# Patient Record
Sex: Female | Born: 1950 | Race: White | Hispanic: No | Marital: Married | Smoking: Never smoker
Health system: Southern US, Academic
[De-identification: ages and names within clinical notes are randomized; demographics above are authoritative.]

## PROBLEM LIST (undated history)

## (undated) DIAGNOSIS — H409 Unspecified glaucoma: Secondary | ICD-10-CM

## (undated) DIAGNOSIS — G4700 Insomnia, unspecified: Secondary | ICD-10-CM

## (undated) DIAGNOSIS — I951 Orthostatic hypotension: Secondary | ICD-10-CM

## (undated) DIAGNOSIS — M1990 Unspecified osteoarthritis, unspecified site: Secondary | ICD-10-CM

## (undated) DIAGNOSIS — F32A Depression, unspecified: Secondary | ICD-10-CM

## (undated) DIAGNOSIS — E782 Mixed hyperlipidemia: Secondary | ICD-10-CM

## (undated) DIAGNOSIS — G459 Transient cerebral ischemic attack, unspecified: Secondary | ICD-10-CM

## (undated) DIAGNOSIS — E119 Type 2 diabetes mellitus without complications: Secondary | ICD-10-CM

## (undated) DIAGNOSIS — M549 Dorsalgia, unspecified: Secondary | ICD-10-CM

## (undated) DIAGNOSIS — E559 Vitamin D deficiency, unspecified: Secondary | ICD-10-CM

## (undated) DIAGNOSIS — G43909 Migraine, unspecified, not intractable, without status migrainosus: Secondary | ICD-10-CM

## (undated) DIAGNOSIS — Z9229 Personal history of other drug therapy: Secondary | ICD-10-CM

## (undated) DIAGNOSIS — G8929 Other chronic pain: Secondary | ICD-10-CM

## (undated) DIAGNOSIS — I10 Essential (primary) hypertension: Secondary | ICD-10-CM

## (undated) DIAGNOSIS — M797 Fibromyalgia: Secondary | ICD-10-CM

## (undated) DIAGNOSIS — F419 Anxiety disorder, unspecified: Secondary | ICD-10-CM

## (undated) HISTORY — DX: Orthostatic hypotension: I95.1

## (undated) HISTORY — PX: HX KNEE REPLACMENT: SHX125

## (undated) HISTORY — DX: Insomnia, unspecified: G47.00

## (undated) HISTORY — DX: Dorsalgia, unspecified: M54.9

## (undated) HISTORY — DX: Unspecified osteoarthritis, unspecified site: M19.90

## (undated) HISTORY — DX: Type 2 diabetes mellitus without complications: E11.9

## (undated) HISTORY — DX: Essential (primary) hypertension: I10

## (undated) HISTORY — PX: HX GALL BLADDER SURGERY/CHOLE: SHX55

## (undated) HISTORY — DX: Migraine, unspecified, not intractable, without status migrainosus: G43.909

## (undated) HISTORY — DX: Other chronic pain: G89.29

## (undated) HISTORY — PX: SKIN CANCER EXCISION: SHX779

## (undated) HISTORY — DX: Fibromyalgia: M79.7

## (undated) HISTORY — PX: HX ROTATOR CUFF REPAIR: SHX139

## (undated) HISTORY — DX: Personal history of other drug therapy: Z92.29

## (undated) HISTORY — DX: Depression, unspecified: F32.A

## (undated) HISTORY — DX: Vitamin D deficiency, unspecified: E55.9

## (undated) HISTORY — DX: Unspecified glaucoma: H40.9

## (undated) HISTORY — DX: Anxiety disorder, unspecified: F41.9

## (undated) HISTORY — DX: Hypomagnesemia: E83.42

## (undated) HISTORY — DX: Mixed hyperlipidemia: E78.2

---

## 1993-02-28 ENCOUNTER — Other Ambulatory Visit (HOSPITAL_COMMUNITY): Payer: Self-pay

## 2020-04-30 IMAGING — CT CT ABDOMEN & PELVIS WITHOUT DYE
2 series · 13 of 38 positions shown, 19 images · non-contrast
Comparison: None.

EXAM:  CT ABDOMEN & PELVIS WITHOUT DYE
INDICATION: Right-sided abdominal pain.
TECHNIQUE: A CT scan is performed using oral contrast only.  Exam was performed using one or more of the following dose reduction techniques: Automated exposure control, adjustment of the mA and/or kV according to patient size, or the use of iterative reconstruction technique. Radiation dose 907 mGy cm.

[Series 8039: cor · coronal · 0.89mm/px · 12 of 128 slices shown, 17 images]
[im 11/128  soft-tissue]
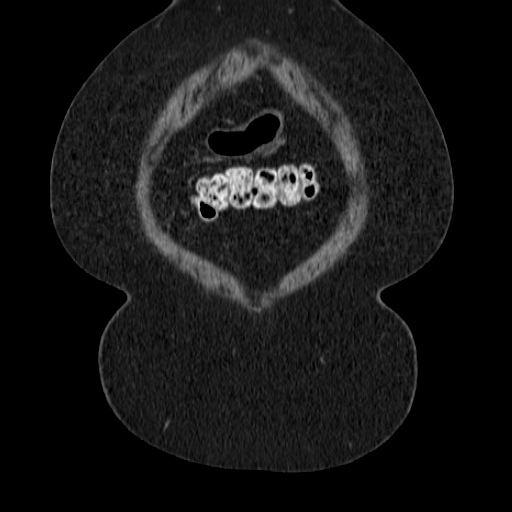
[im 11/128  lung]
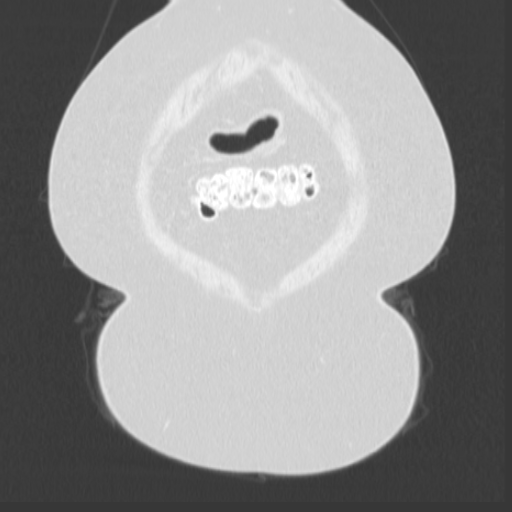
[im 11/128  bone]
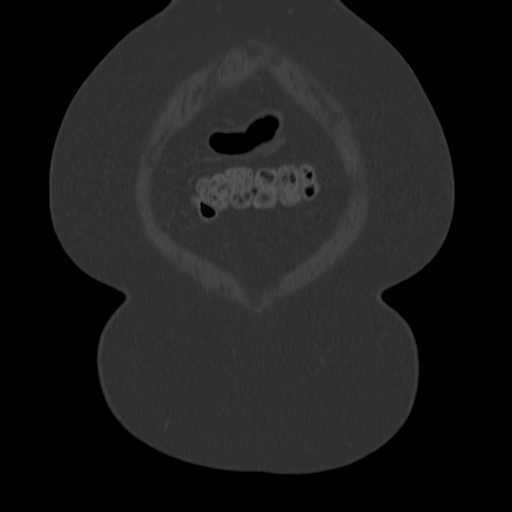
[im 22/128  soft-tissue]
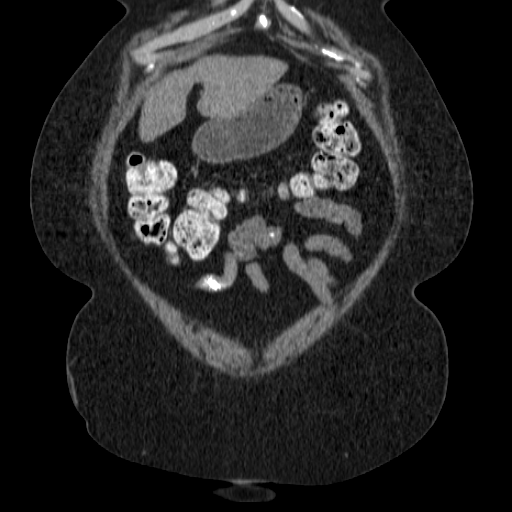
[im 22/128  lung]
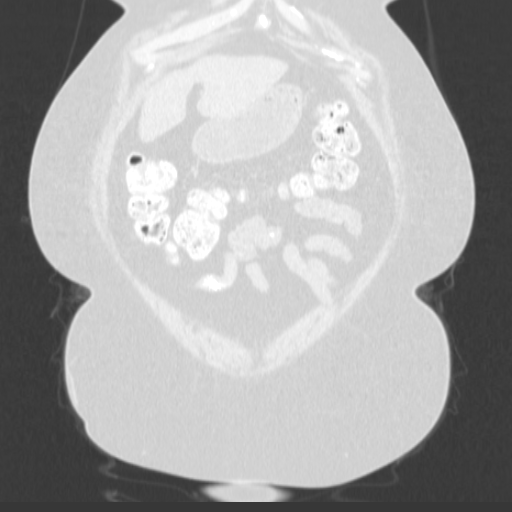
[im 29/128  soft-tissue]
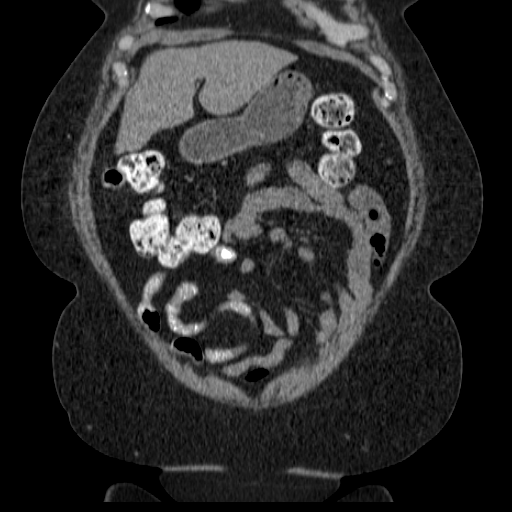
[im 32/128  lung]
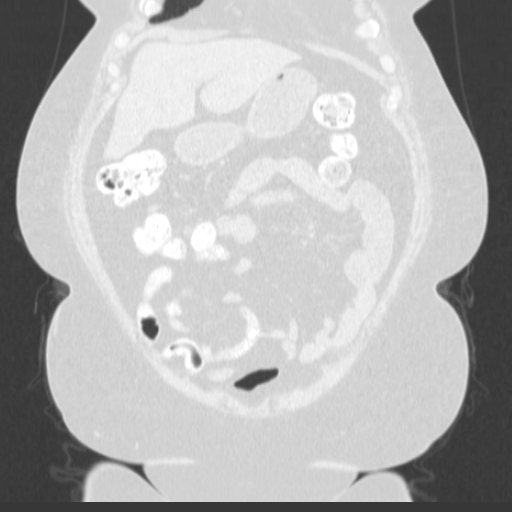
[im 43/128  soft-tissue]
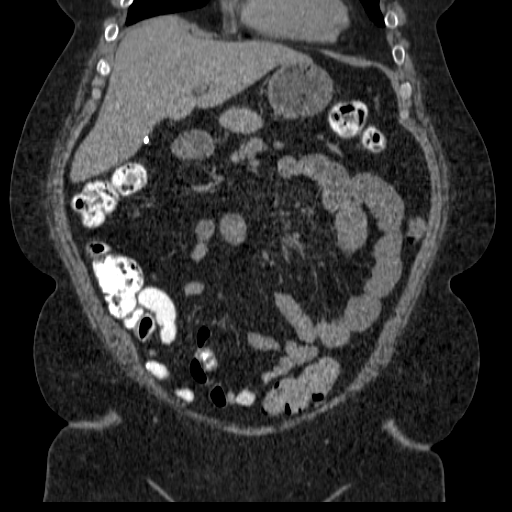
[im 43/128  lung]
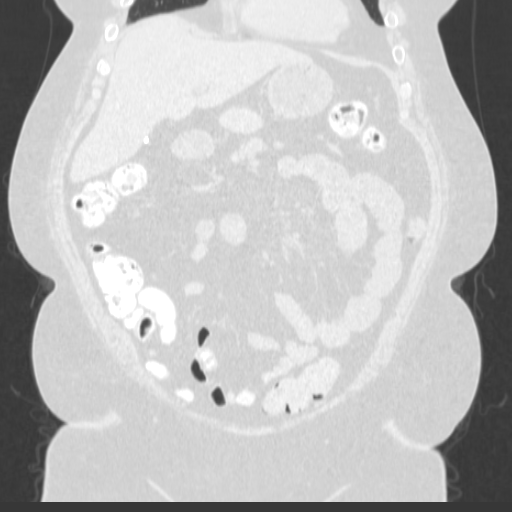
[im 53/128  soft-tissue]
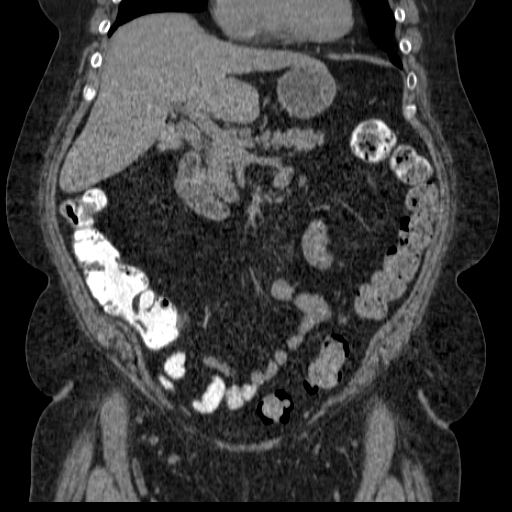
[im 64/128  soft-tissue]
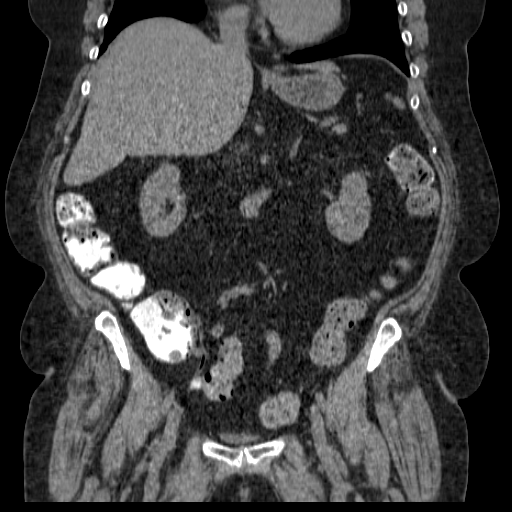
[im 75/128  soft-tissue]
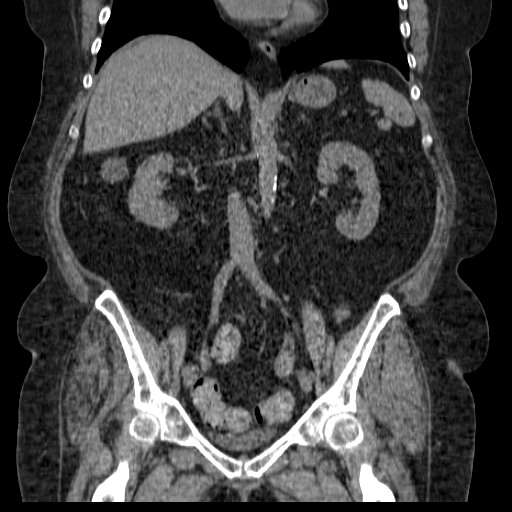
[im 85/128  soft-tissue]
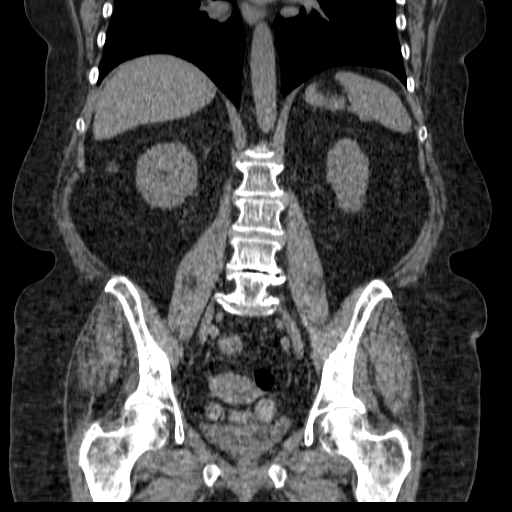
[im 99/128  soft-tissue]
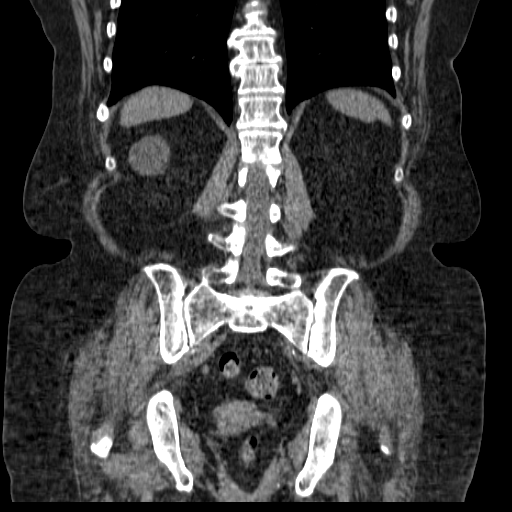
[im 99/128  bone]
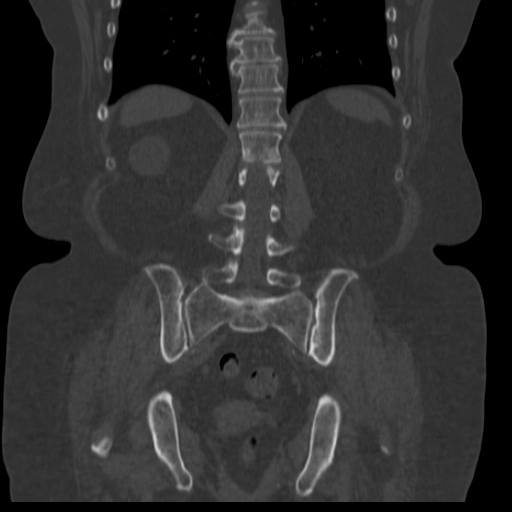
[im 106/128  soft-tissue]
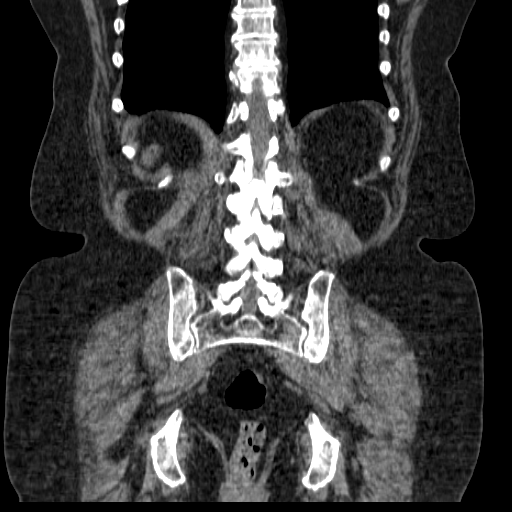
[im 117/128  soft-tissue]
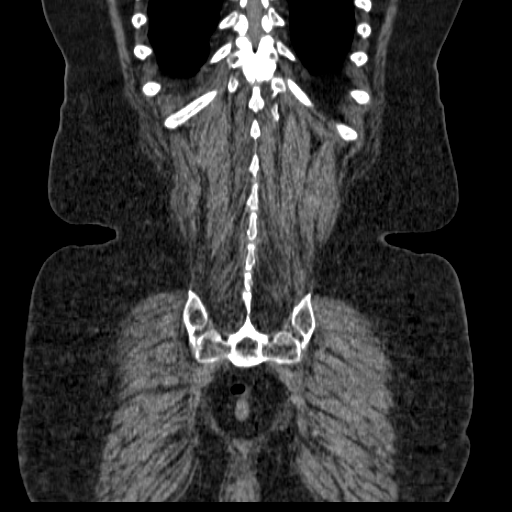

[sag · sagittal · 0.89mm/px · 1 of 191 slices shown, 2 images]
[im 96/191  soft-tissue]
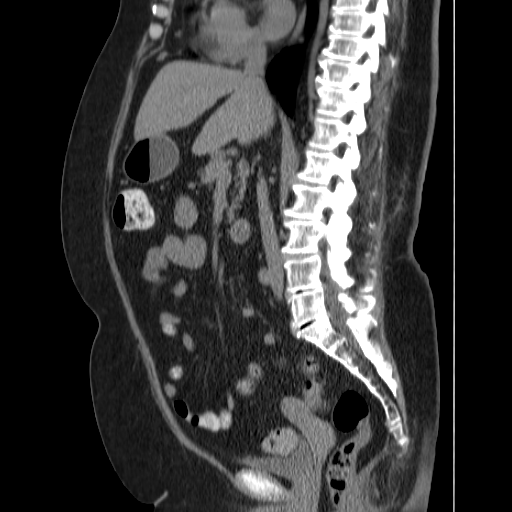
[im 96/191  bone]
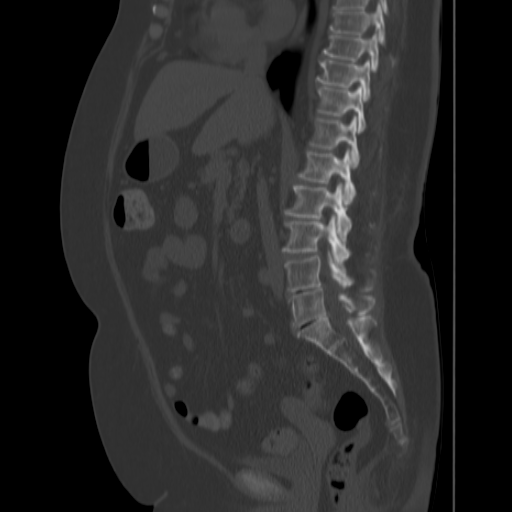

[13 of 38 positions shown; findings below may reference images not displayed]

FINDINGS: The lung bases are clear.  The attenuation coefficients of the liver, spleen, and pancreas are uniform.  No intrahepatic biliary duct dilatation is observed.  The gallbladder is surgically absent.  The adrenal glands are seen bilaterally and appear normal.  No renal calculi or hydronephrosis is observed.  There is an exophytic cyst measuring 3.9 cm arising from the upper pole of the right kidney.  An ultrasound can confirm this finding.  The aorta is normal in caliber.  No dilated loops of bowel, free fluid or free air is seen in the peritoneum.  The appendix is visualized and appears normal.  There are diverticula noted in the sigmoid colon with some wall thickening and early diverticulitis cannot be excluded.  The uterus is normal in size, shape and contour.  No adnexal masses are identified.  Bone window images show mild degenerative changes in the lower lumbar spine.  No aggressive lytic or blastic bony lesions identified.  No pathologic lymphadenopathy is seen.
IMPRESSION: A 3.9 cm cyst appearing to arise from the upper pole of the right kidney.  

No renal calculi or obstructive uropathy.

The gallbladder is surgically absent.

Normal-appearing appendix. 

Diverticula in the sigmoid colon with wall thickening.  Early diverticulitis cannot be excluded.

No dilated loops of bowel, free fluid or free air in the peritoneum. 

No aggressive lytic or blastic bony lesion. 

No evidence for pathologic lymphadenopathy.

## 2020-05-17 IMAGING — US US RETROPERITONEAL COMPLETE
1 series · 14 of 25 positions shown · non-contrast
Comparison: CT scan dated 04/30/2020.

EXAM:  US RETROPERITONEAL COMPLETE
INDICATION: Right renal cyst.

[Series 4: us retroperitoneal complete · 0.37mm/px · 14 of 58 slices shown]
[im 1/58]
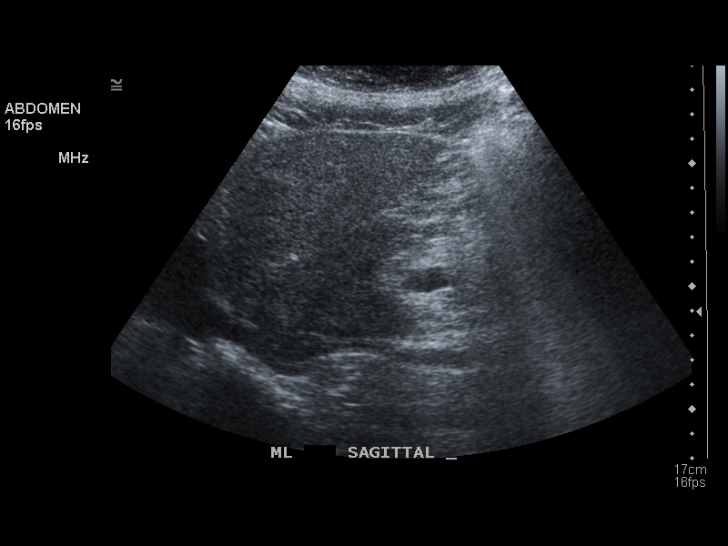
[im 5/58]
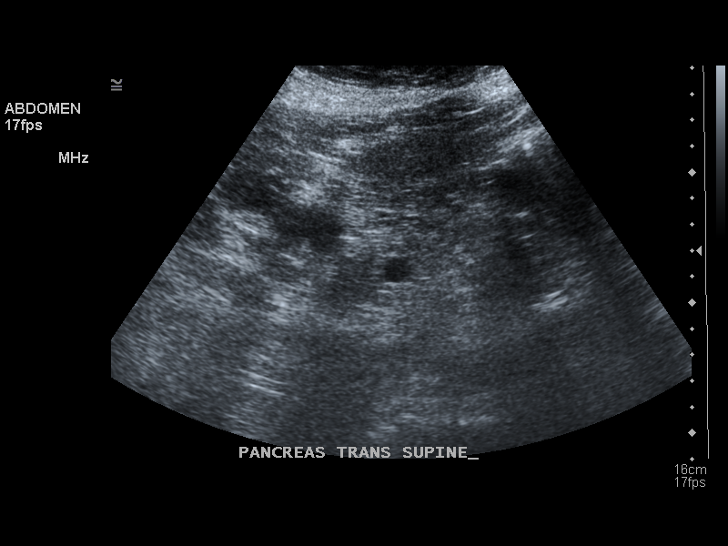
[im 10/58]
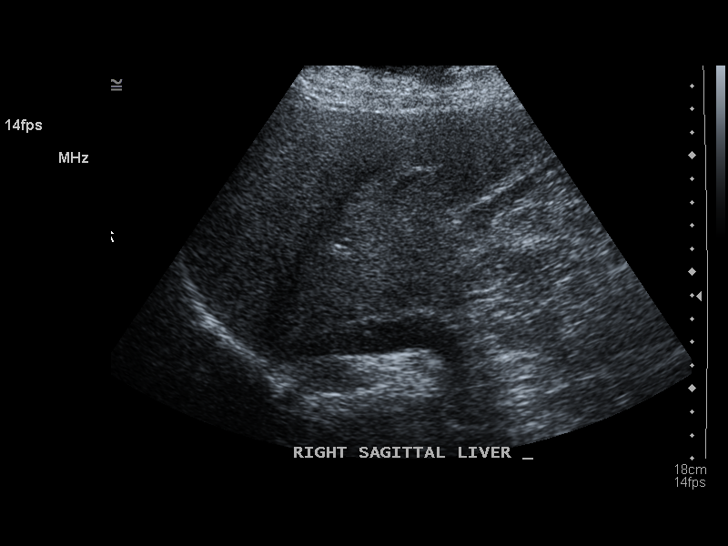
[im 15/58]
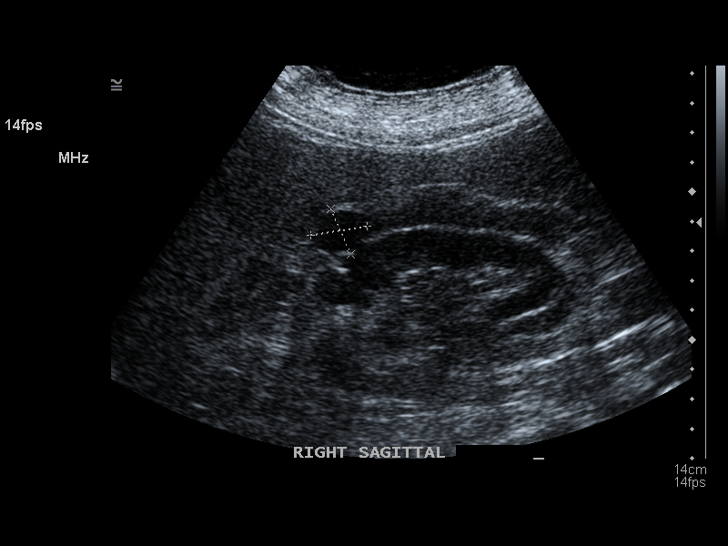
[im 20/58]
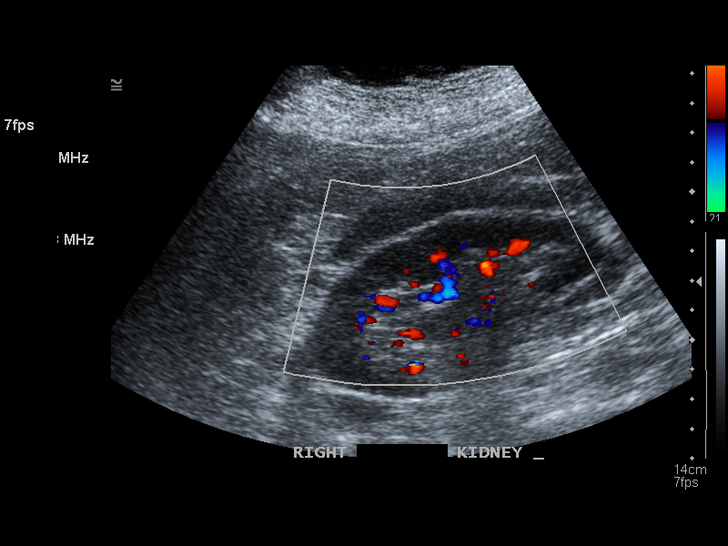
[im 22/58]
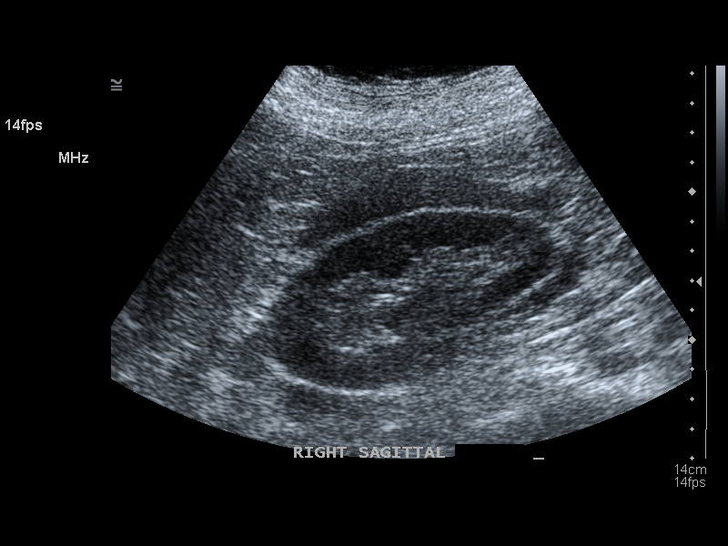
[im 27/58]
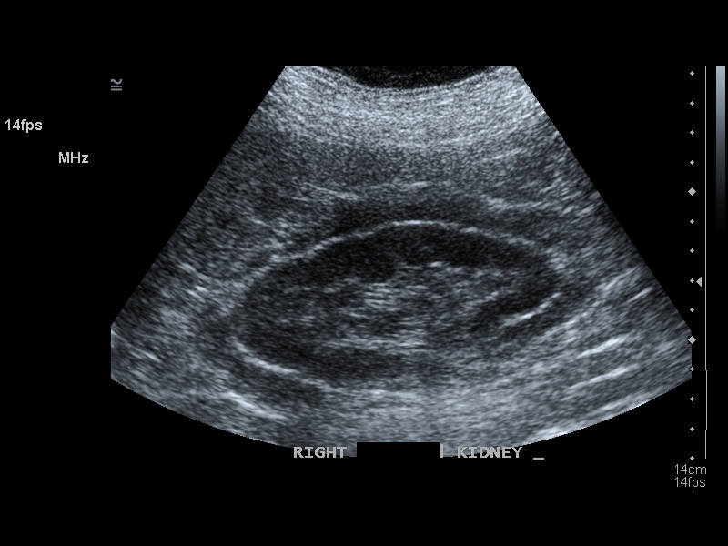
[im 31/58]
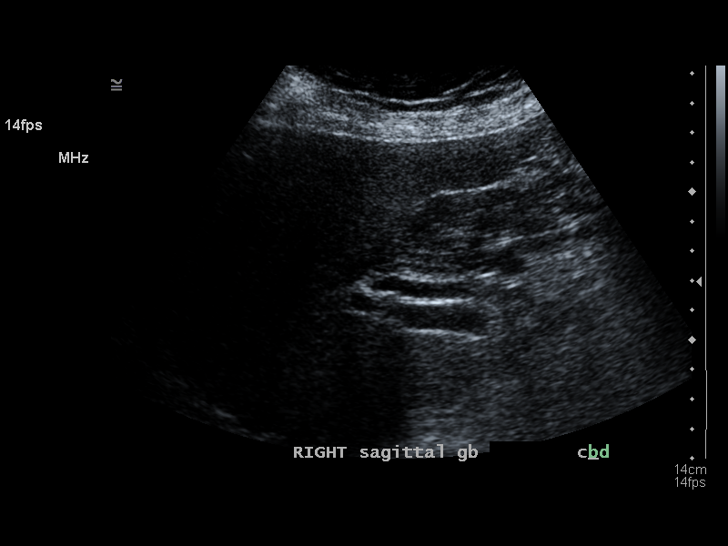
[im 36/58]
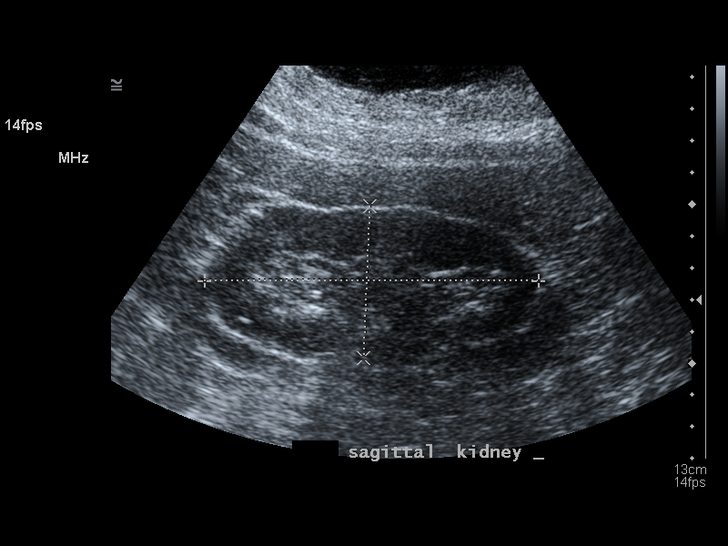
[im 39/58]
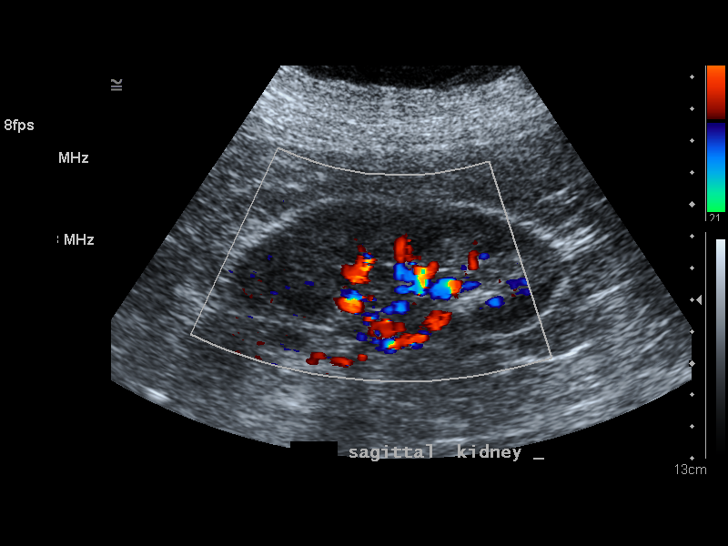
[im 43/58]
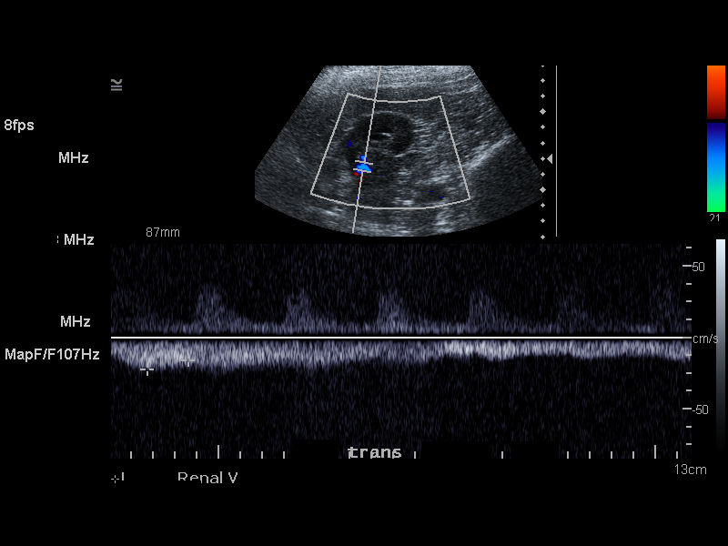
[im 48/58]
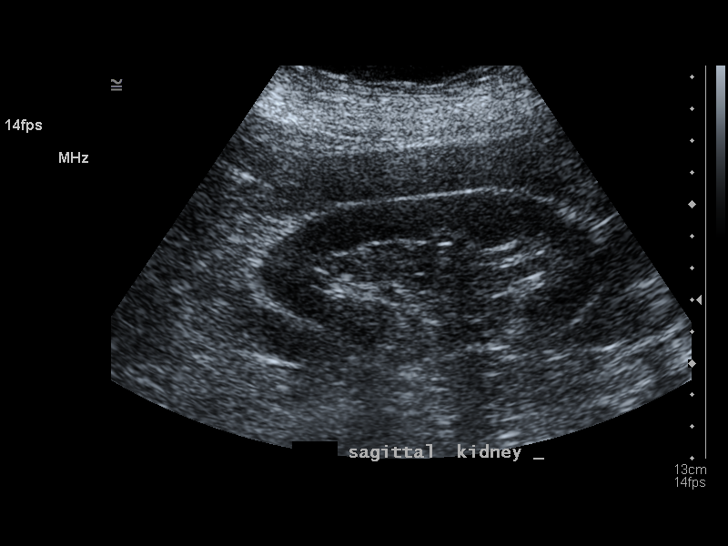
[im 53/58]
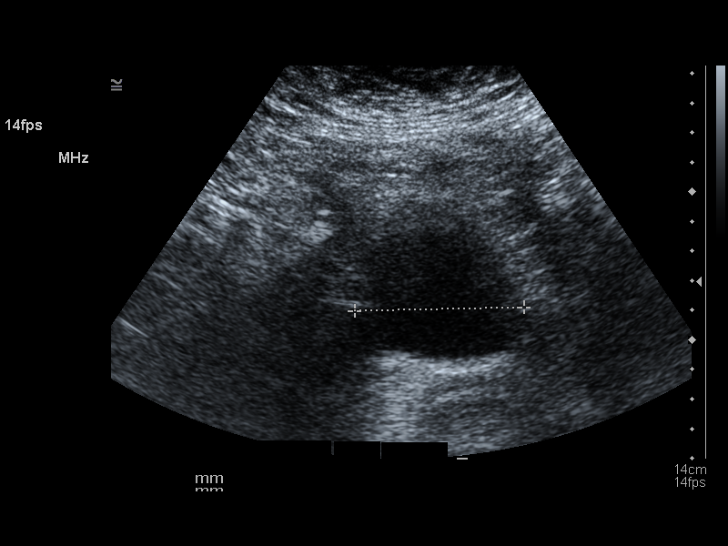
[im 58/58]
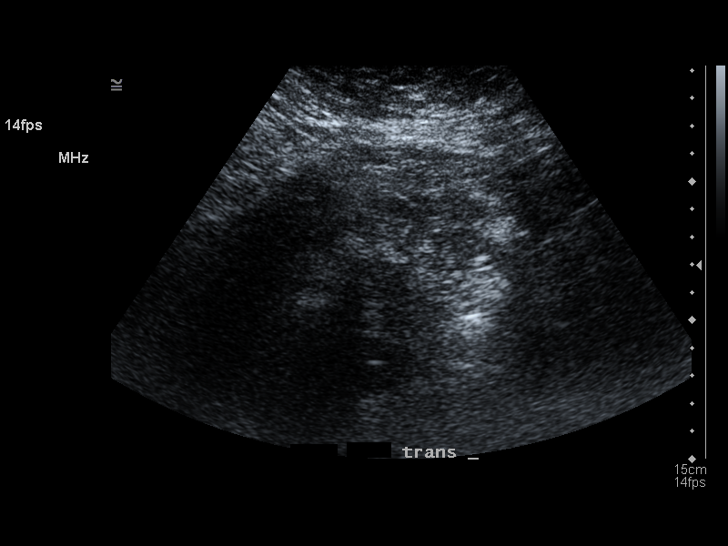

[14 of 25 positions shown; findings below may reference images not displayed]

FINDINGS: Kidneys are normal in echogenicity and measure 10.5 cm bilaterally. A 3.9 cm right kidney upper pole exophytic cyst is again identified. There is no hydronephrosis, mass or shadowing calculus on either side.

Urinary bladder is partially distended and grossly unremarkable. There is no significant postvoid residual.
IMPRESSION: Stable 3.9 cm right kidney upper pole exophytic cyst.

## 2020-10-10 IMAGING — MR MRI CERVICAL SPINE WITHOUT CONTRAST
4 series · 19 of 48 positions shown · IV contrast (gadolinium)
Comparison: None available.

﻿EXAM:  MRI CERVICAL SPINE WITHOUT CONTRAST
INDICATION: Neck pain with bilateral upper extremity numbness.
TECHNIQUE: Multiplanar multisequential MRI of the cervical spine was performed without gadolinium contrast.

[Series 8: T1 · sagittal · 3.0mm · 0.49mm/px · 4 of 11 slices shown]
[im 1/11]
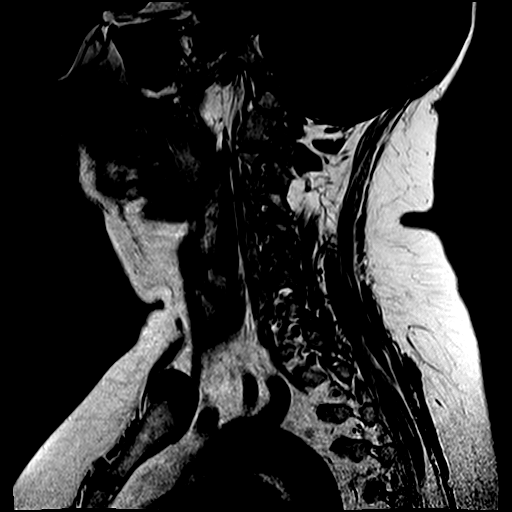
[im 2/11]
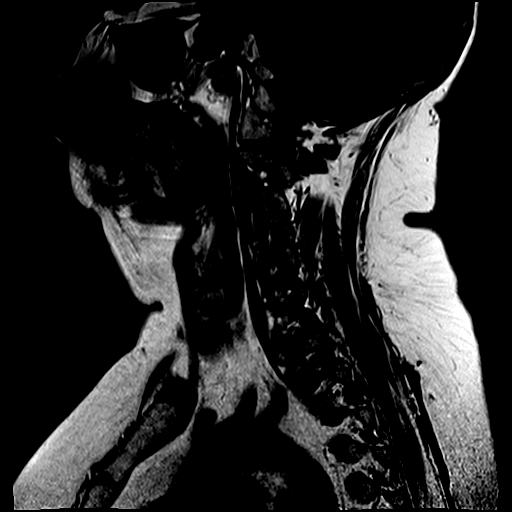
[im 6/11]
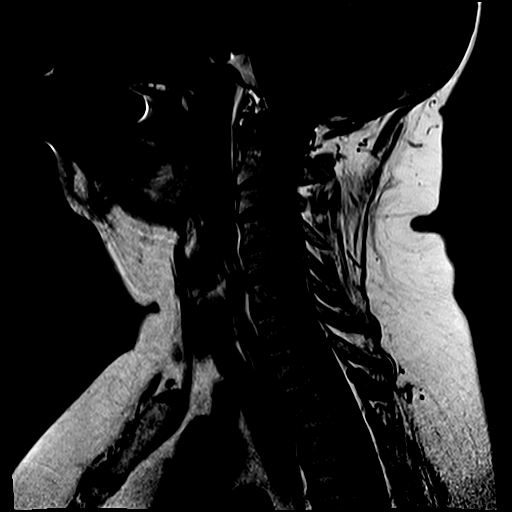
[im 9/11]
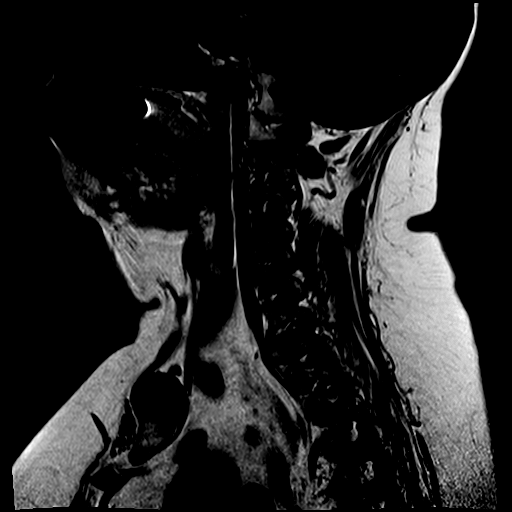

[Series 9: STIR · sagittal · 3.0mm · 0.49mm/px · 3 of 11 slices shown]
[im 2/11]
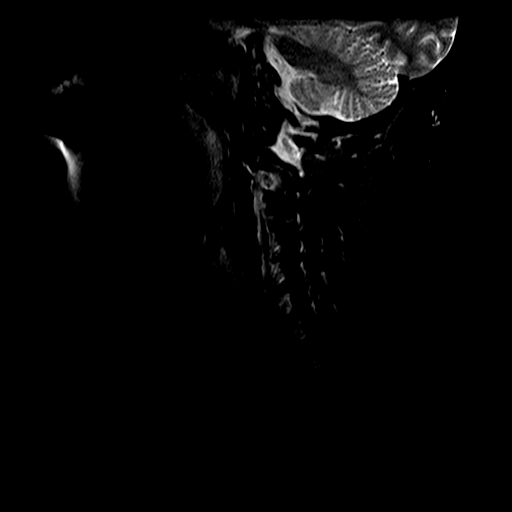
[im 6/11]
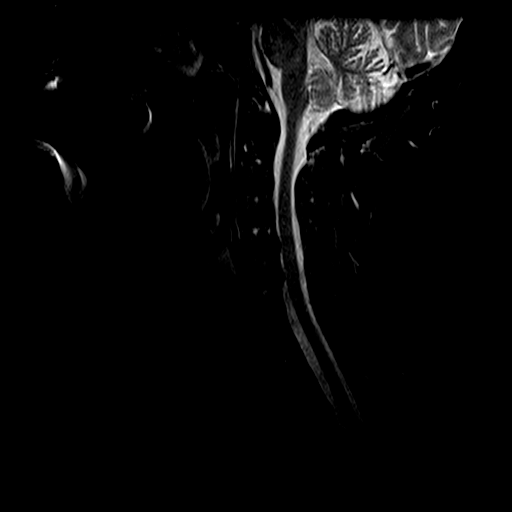
[im 9/11]
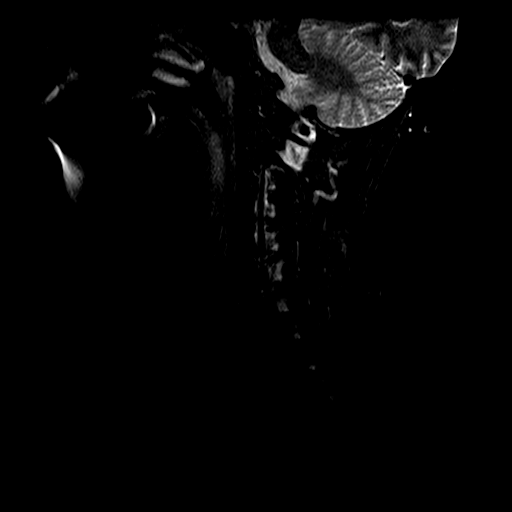

[Series 10: T2 · axial · 3.0mm · 0.39mm/px · z∈[-83,+25]mm · 9 of 18 slices shown]
[im 1/18]
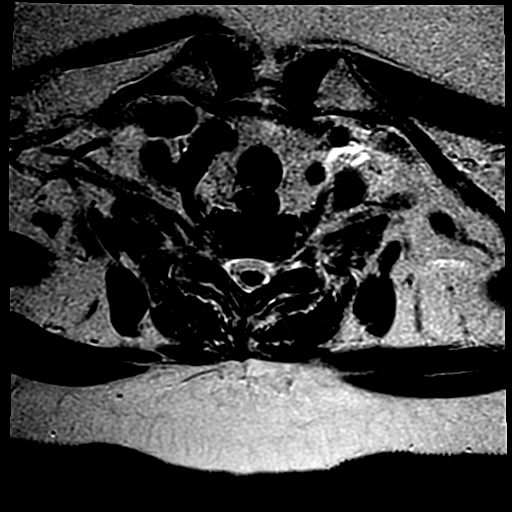
[im 3/18]
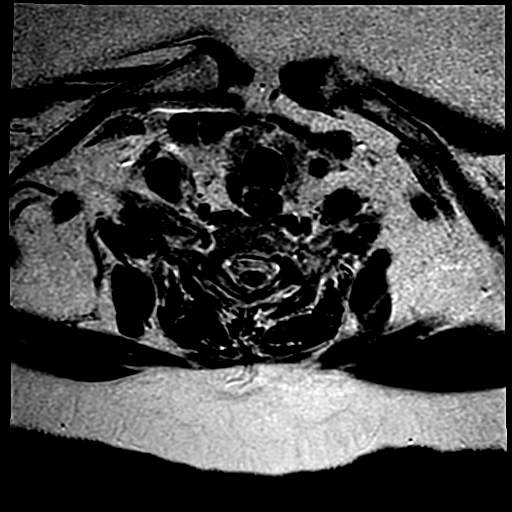
[im 5/18]
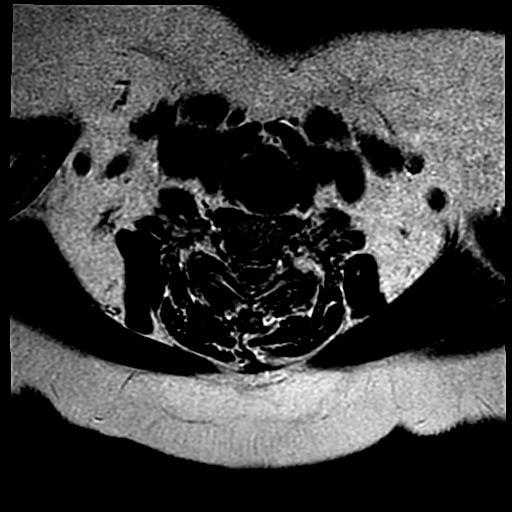
[im 8/18]
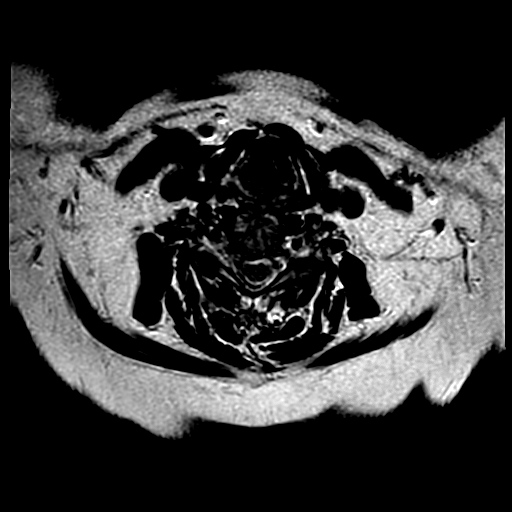
[im 9/18]
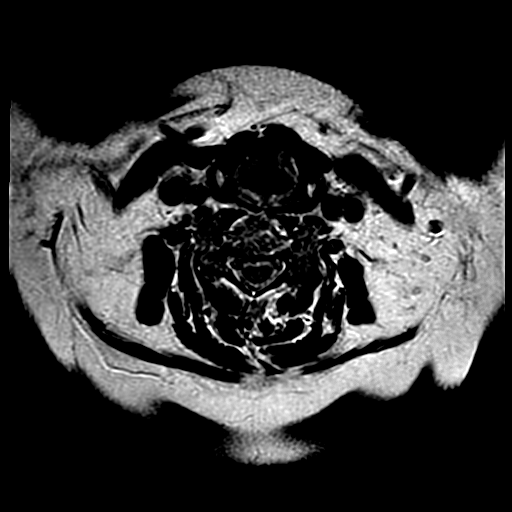
[im 10/18]
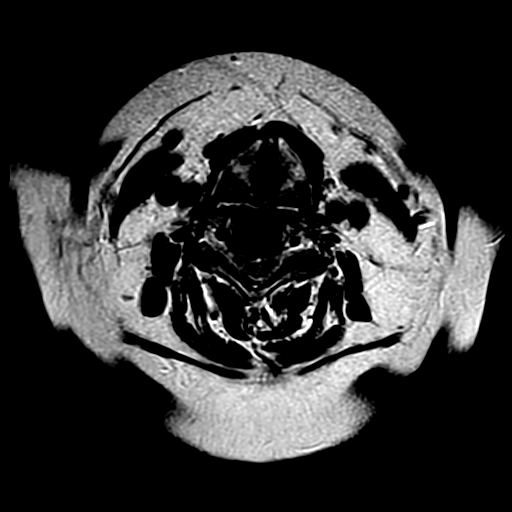
[im 13/18]
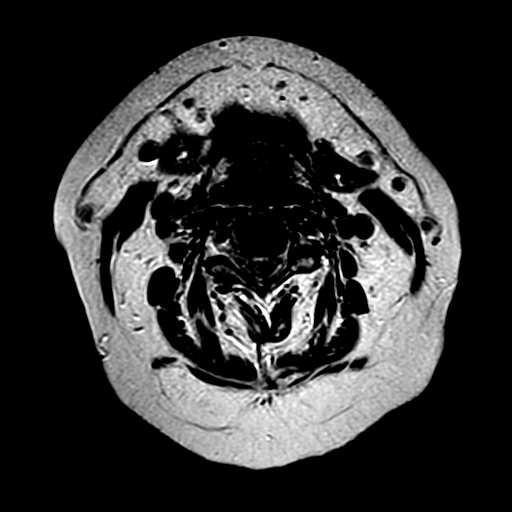
[im 15/18]
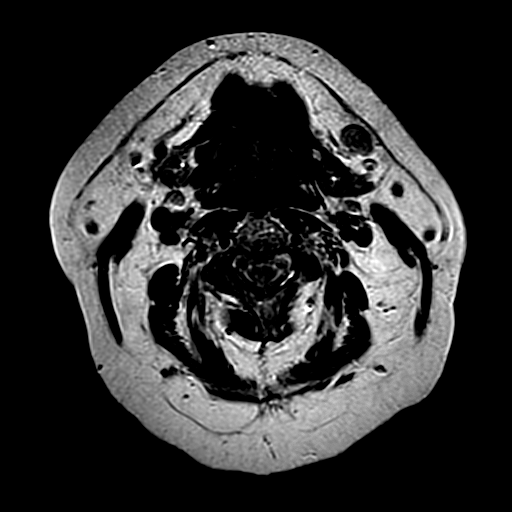
[im 18/18]
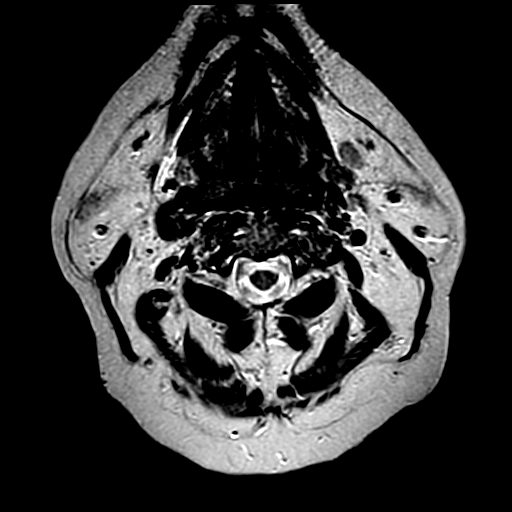

[Series 11: T2-star · axial · 3.0mm · 0.39mm/px · z∈[-75,+6]mm · 3 of 18 slices shown]
[im 3/18]
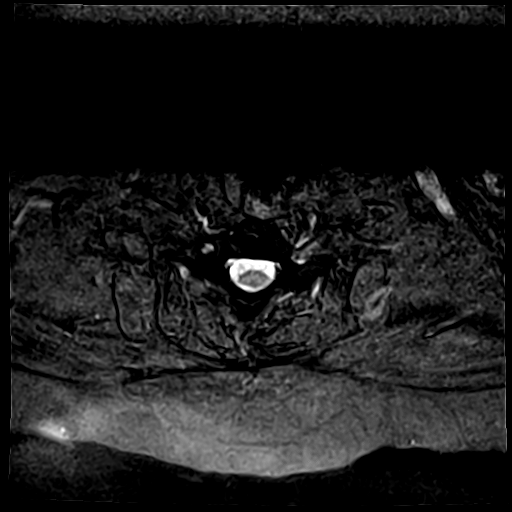
[im 9/18]
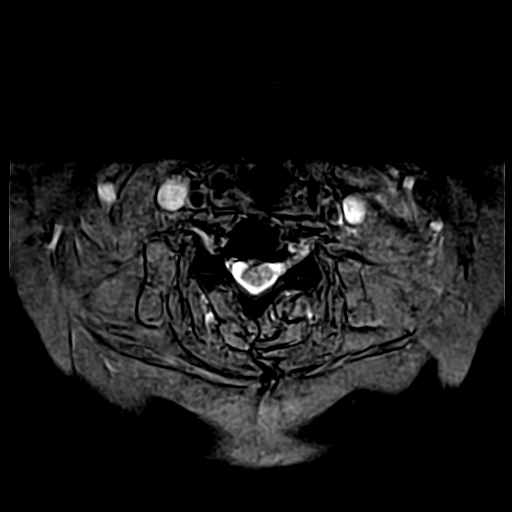
[im 15/18]
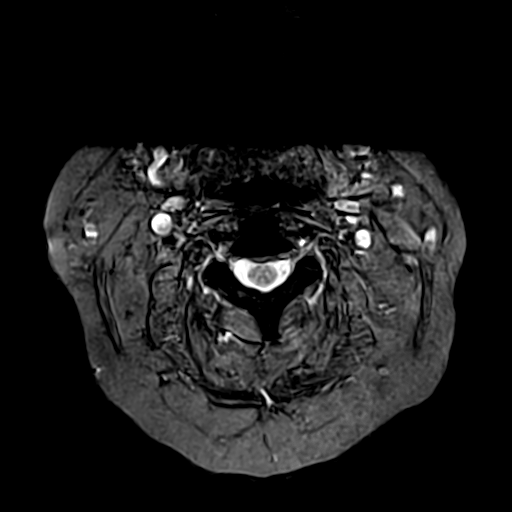

[19 of 48 positions shown; findings below may reference images not displayed]

FINDINGS: Vertebral bodies are normal in height, alignment and signal intensity. There is no acute fracture or subluxation. Visualized spinal cord is normal in signal intensity without evidence of compression at any level.

At C2-3 level, there is a small broad-based central disc bulge, mildly effacing the ventral CSF. There is no significant neural foraminal stenosis.

At C3-4 level, there is a minimal bulging annulus, minimally effacing the ventral CSF. There is moderate to severe left neural foraminal stenosis from facet and uncovertebral joint hypertrophy.

At C4-5 level, there is a small broad-based central disc osteophyte complex with near complete effacement of the ventral CSF. There is no significant neural foraminal stenosis.

At C5-6 level, there is a small broad-based central disc osteophyte complex with near complete effacement of the ventral CSF. There is no significant neural foraminal stenosis.

C6-7, C7-T1 level and paraspinal soft tissues are unremarkable.
IMPRESSION: 1. Near complete effacement of the ventral CSF at C4-5 and C5-6 levels from small central disc osteophyte complexes. 

2. Moderate to severe left neural foraminal stenosis at C3-4 level from facet and uncovertebral joint hypertrophy.

## 2020-10-10 IMAGING — MR MRI LUMBAR SPINE WITHOUT CONTRAST
6 series · 41 of 48 positions shown · IV contrast (gadolinium)
Comparison: CT abdomen pelvis dated 04/30/2020.

﻿EXAM:  MRI LUMBAR SPINE WITHOUT CONTRAST
INDICATION: Lower back pain with bilateral lower extremity numbness.
TECHNIQUE: Multiplanar multisequential MRI of the lumbar spine was performed without gadolinium contrast.

[Series 9: T2 · sagittal · 4.0mm · 0.94mm/px · 5 of 13 slices shown (1 of 3)]
[im 1/13]
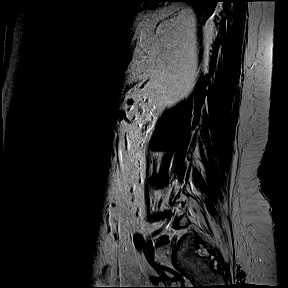
[im 4/13]
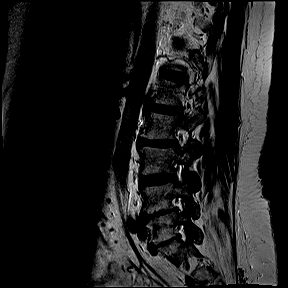
[im 7/13]
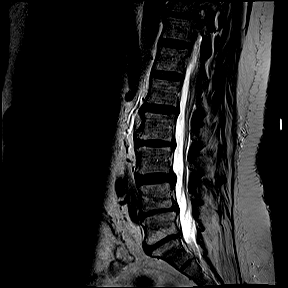
[im 10/13]
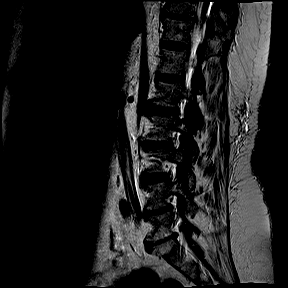
[im 13/13]
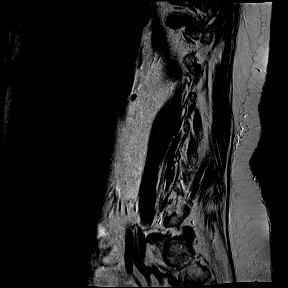

[Series 10: T1 · sagittal · 4.0mm · 0.94mm/px · 6 of 13 slices shown (1 of 2)]
[im 1/13]
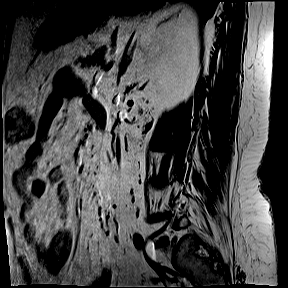
[im 3/13]
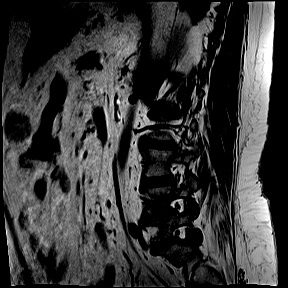
[im 5/13]
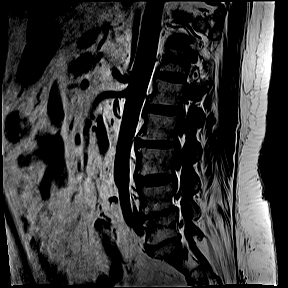
[im 8/13]
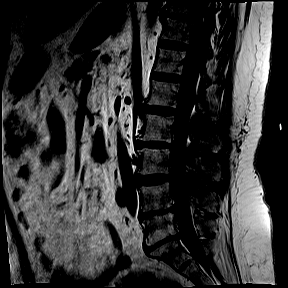
[im 10/13]
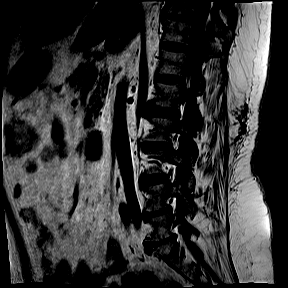
[im 13/13]
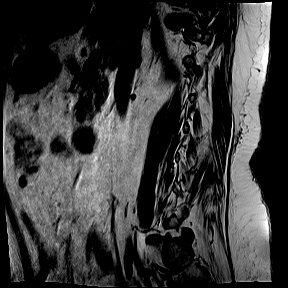

[Series 12: STIR · sagittal · 4.0mm · 1.05mm/px · 2 of 13 slices shown]
[im 1/13]
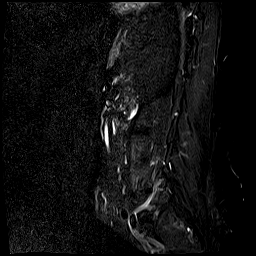
[im 3/13]
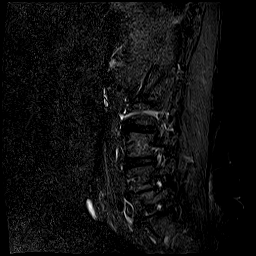

[Series 13: T2 · axial · 4.0mm · 0.47mm/px · z∈[-122,+49]mm · 11 of 23 slices shown (2 of 3)]
[im 1/23]
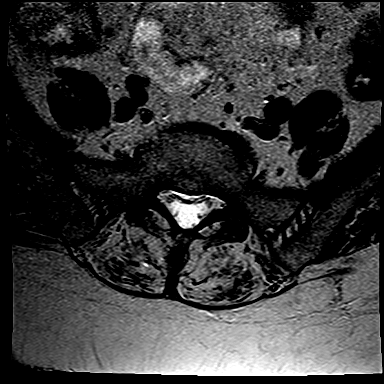
[im 3/23]
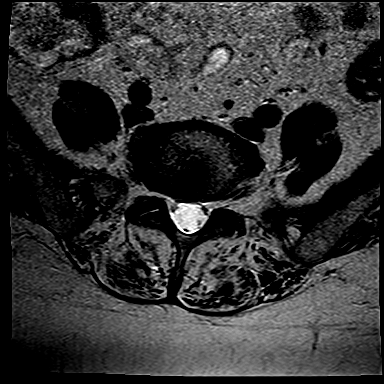
[im 5/23]
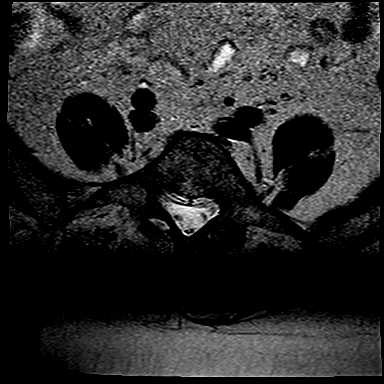
[im 7/23]
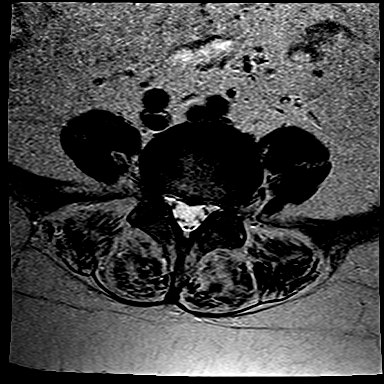
[im 9/23]
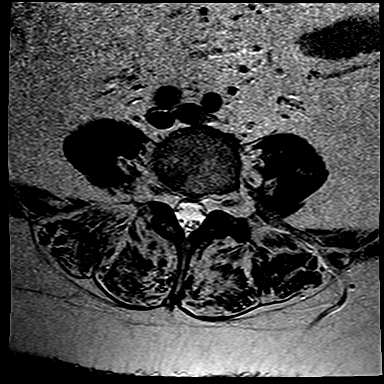
[im 12/23]
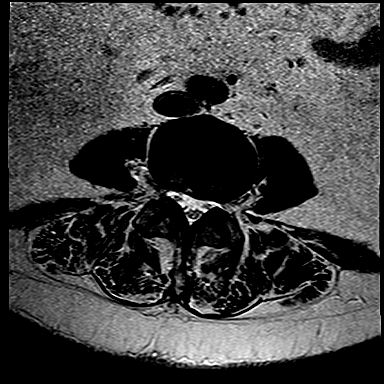
[im 14/23]
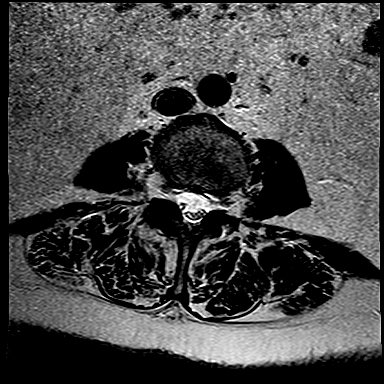
[im 16/23]
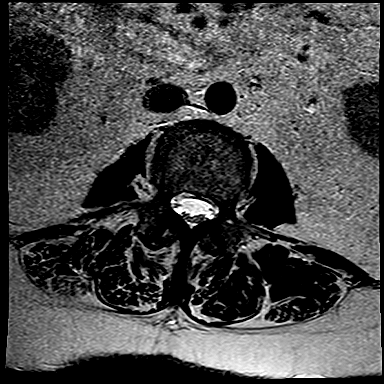
[im 18/23]
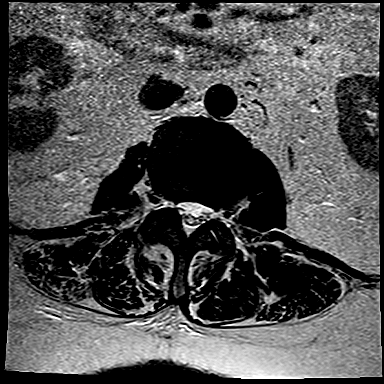
[im 20/23]
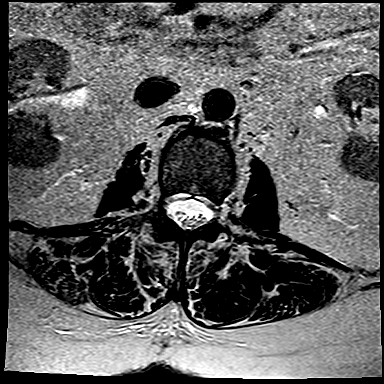
[im 23/23]
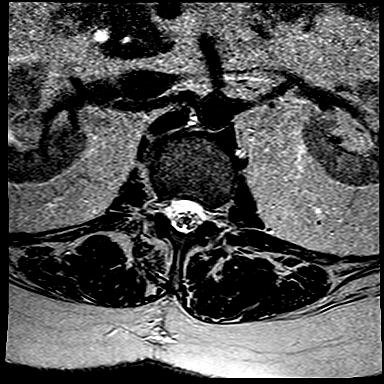

[Series 14: T1 · axial · 4.0mm · 0.47mm/px · z∈[-122,+49]mm · 8 of 23 slices shown (2 of 2)]
[im 1/23]
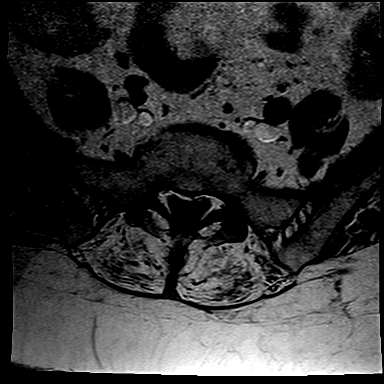
[im 5/23]
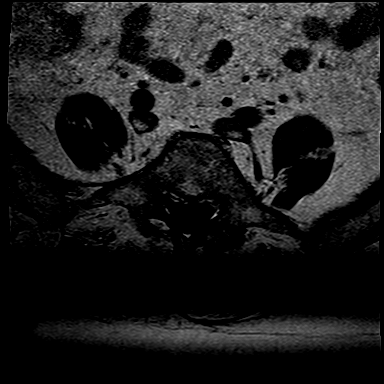
[im 7/23]
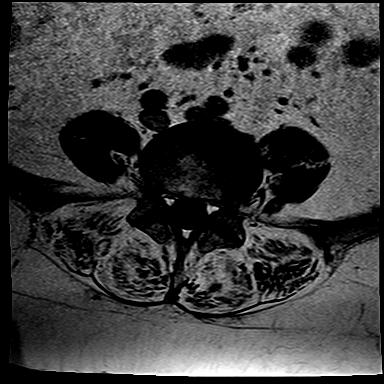
[im 9/23]
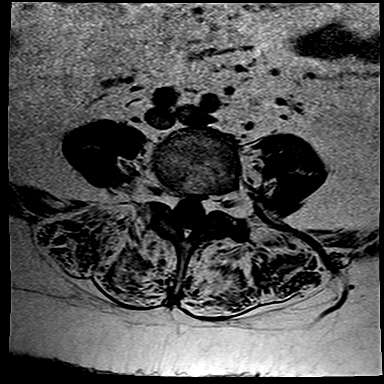
[im 14/23]
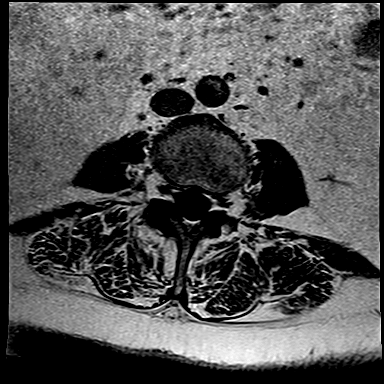
[im 16/23]
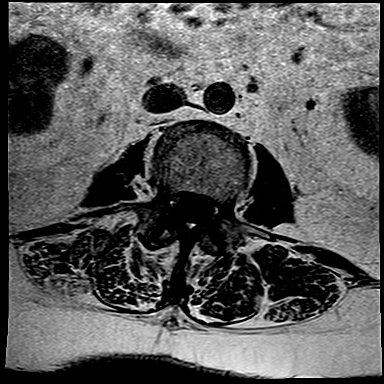
[im 18/23]
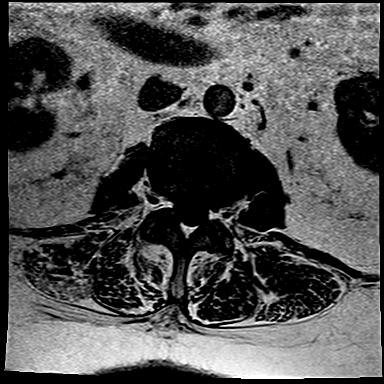
[im 23/23]
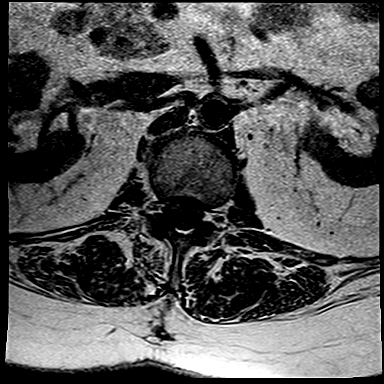

[Series 15: T2 · coronal · 4.0mm · 1.15mm/px · 9 of 20 slices shown (3 of 3)]
[im 1/20]
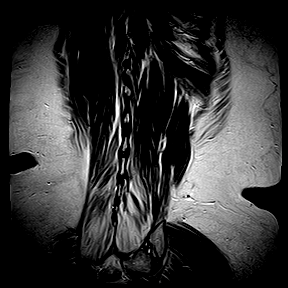
[im 3/20]
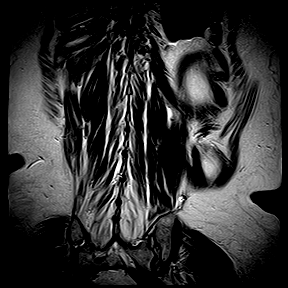
[im 5/20]
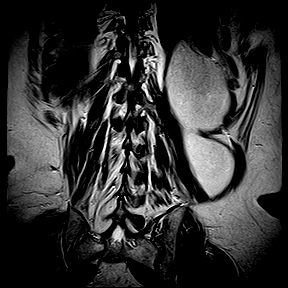
[im 8/20]
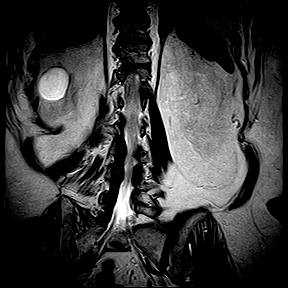
[im 10/20]
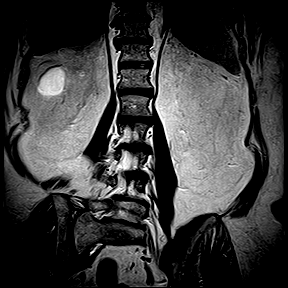
[im 12/20]
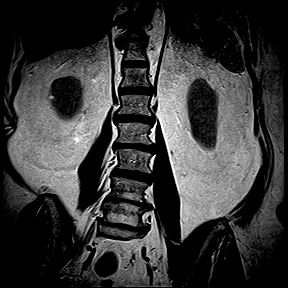
[im 15/20]
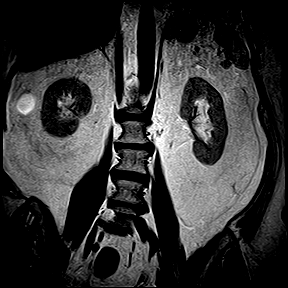
[im 17/20]
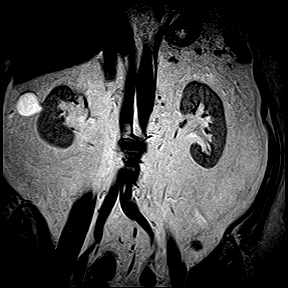
[im 20/20]
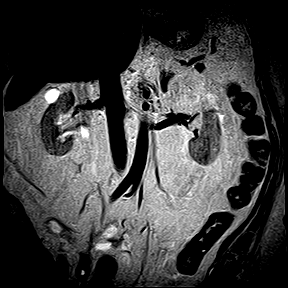

[41 of 48 positions shown; findings below may reference images not displayed]

FINDINGS: There is a mild levoscoliosis centered at L2-3 disc space level. Bone marrow signal intensity is normal. There is no acute fracture or subluxation. Distal spinal cord is normal in signal intensity and terminates normally at L1 vertebral body level. Spinal canal is congenitally narrow.

At L1-2 level, there is a small broad-based central disc bulge, mildly effacing the ventral thecal sac. There is mild bilateral neural foraminal stenosis from facet arthropathy and bulging annulus.

At L2-3 level, there is moderate disc desiccation. There is a small broad-based central disc bulge, mildly effacing the ventral thecal sac. There is mild bilateral neural foraminal stenosis from facet arthropathy and bulging annulus without nerve root impingement.

At L3-4 level, there is a small broad-based central disc bulge resulting in mild spinal stenosis. There is mild bilateral neural foraminal stenosis from facet arthropathy and bulging annulus without nerve root impingement.

At L4-5 level, there is marked disc desiccation. Modic type 2 changes are also noted within the inferior endplate of L4 and superior endplate of L5 vertebral body. There is a small broad-based central disc bulge, mildly effacing the ventral thecal sac. There is mild bilateral neural foraminal stenosis from facet arthropathy and bulging annulus without nerve root impingement.

At L5-S1 level, there is marked disc desiccation. Modic type 2 changes are also noted within the inferior endplate of L5 and superior endplate of S1 vertebral body. There is a small broad-based central disc bulge, mildly effacing the ventral thecal sac. There is mild to moderate right and mild left neural foraminal stenosis from facet arthropathy and bulging annulus.

Paraspinal soft tissues are unremarkable. There are small left renal cysts.
IMPRESSION: 1. Mild levoscoliosis centered at L2-3 disc space level. 

2. Small disc bulges at multiple levels with mild spinal stenosis at L3-4 level. 

3. Multilevel neural foraminal stenosis as detailed above.

## 2021-06-19 IMAGING — CR XRAY SHOULDER MINIMUM 2 VIEW LT
1 series · 3 of 3 positions shown · non-contrast
Comparison: None available.

﻿EXAM:  XRAY SHOULDER MINIMUM 2 VIEW LT
INDICATION: Pain.

[Series 1: view not recorded · 0.17mm/px · 3 of 3 slices shown]
[im 1/3]
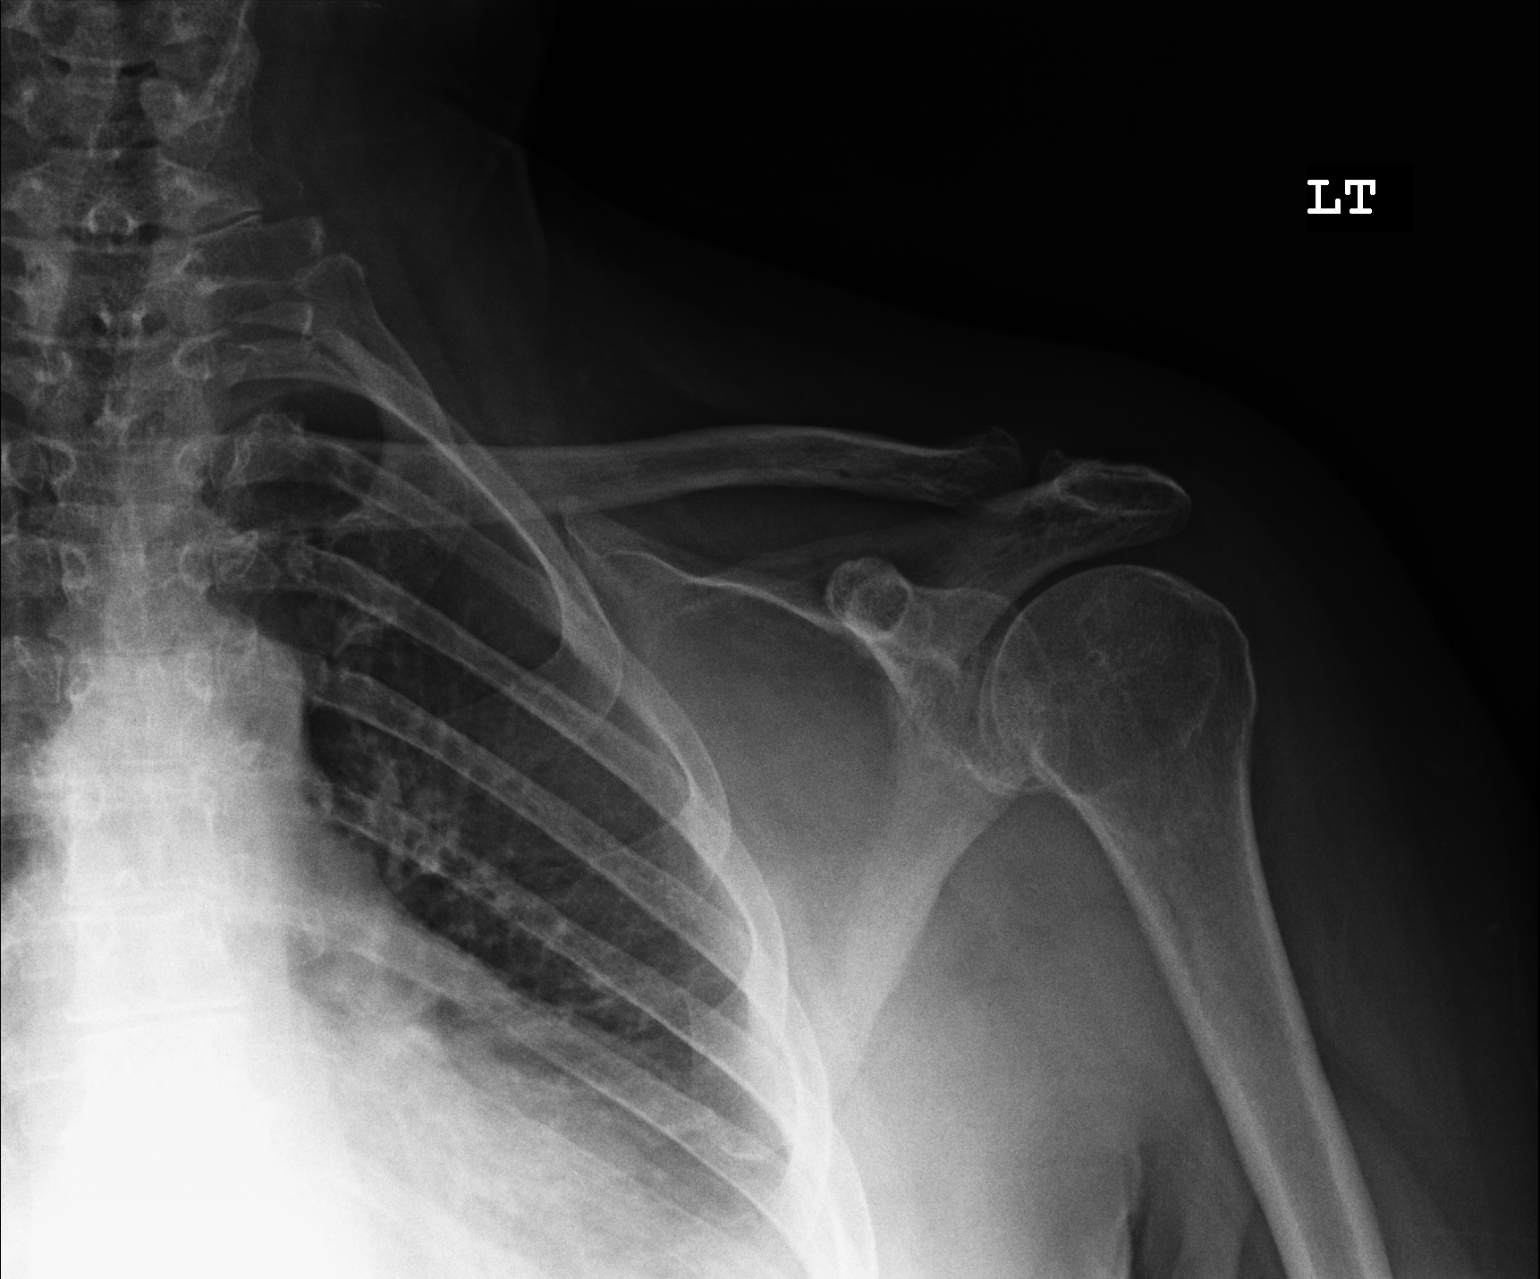
[im 2/3]
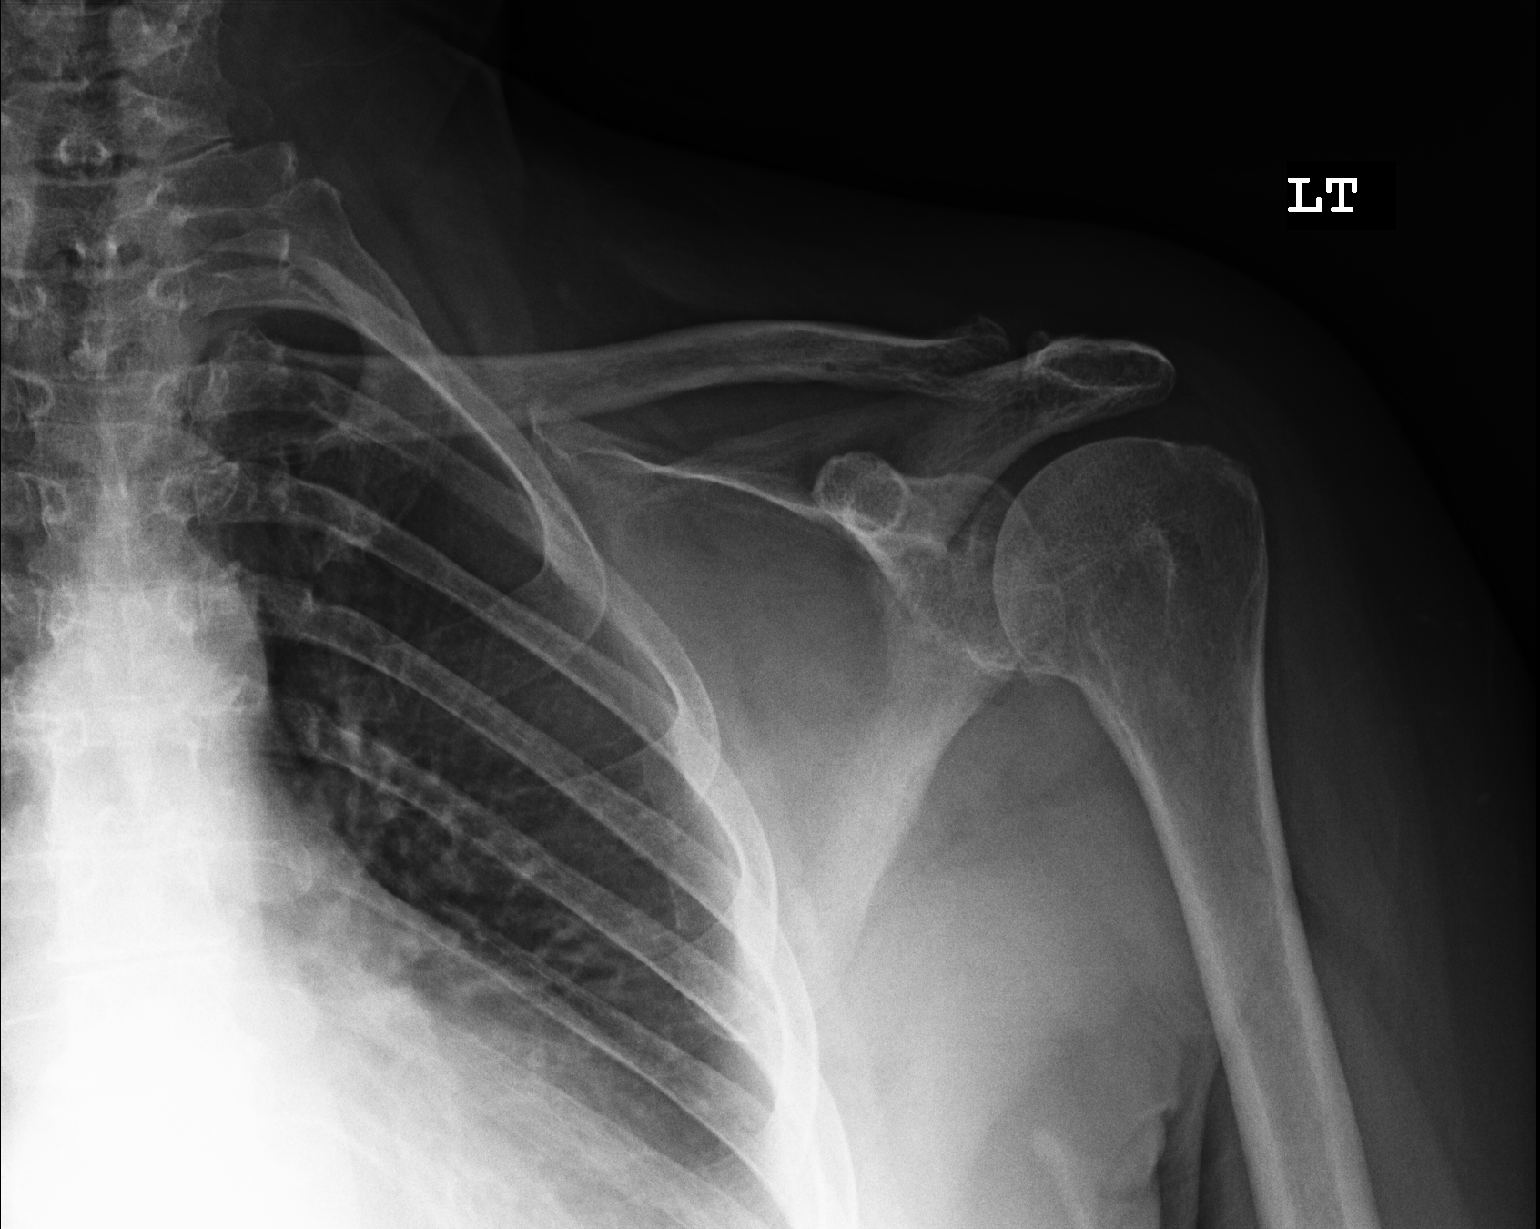
[im 3/3]
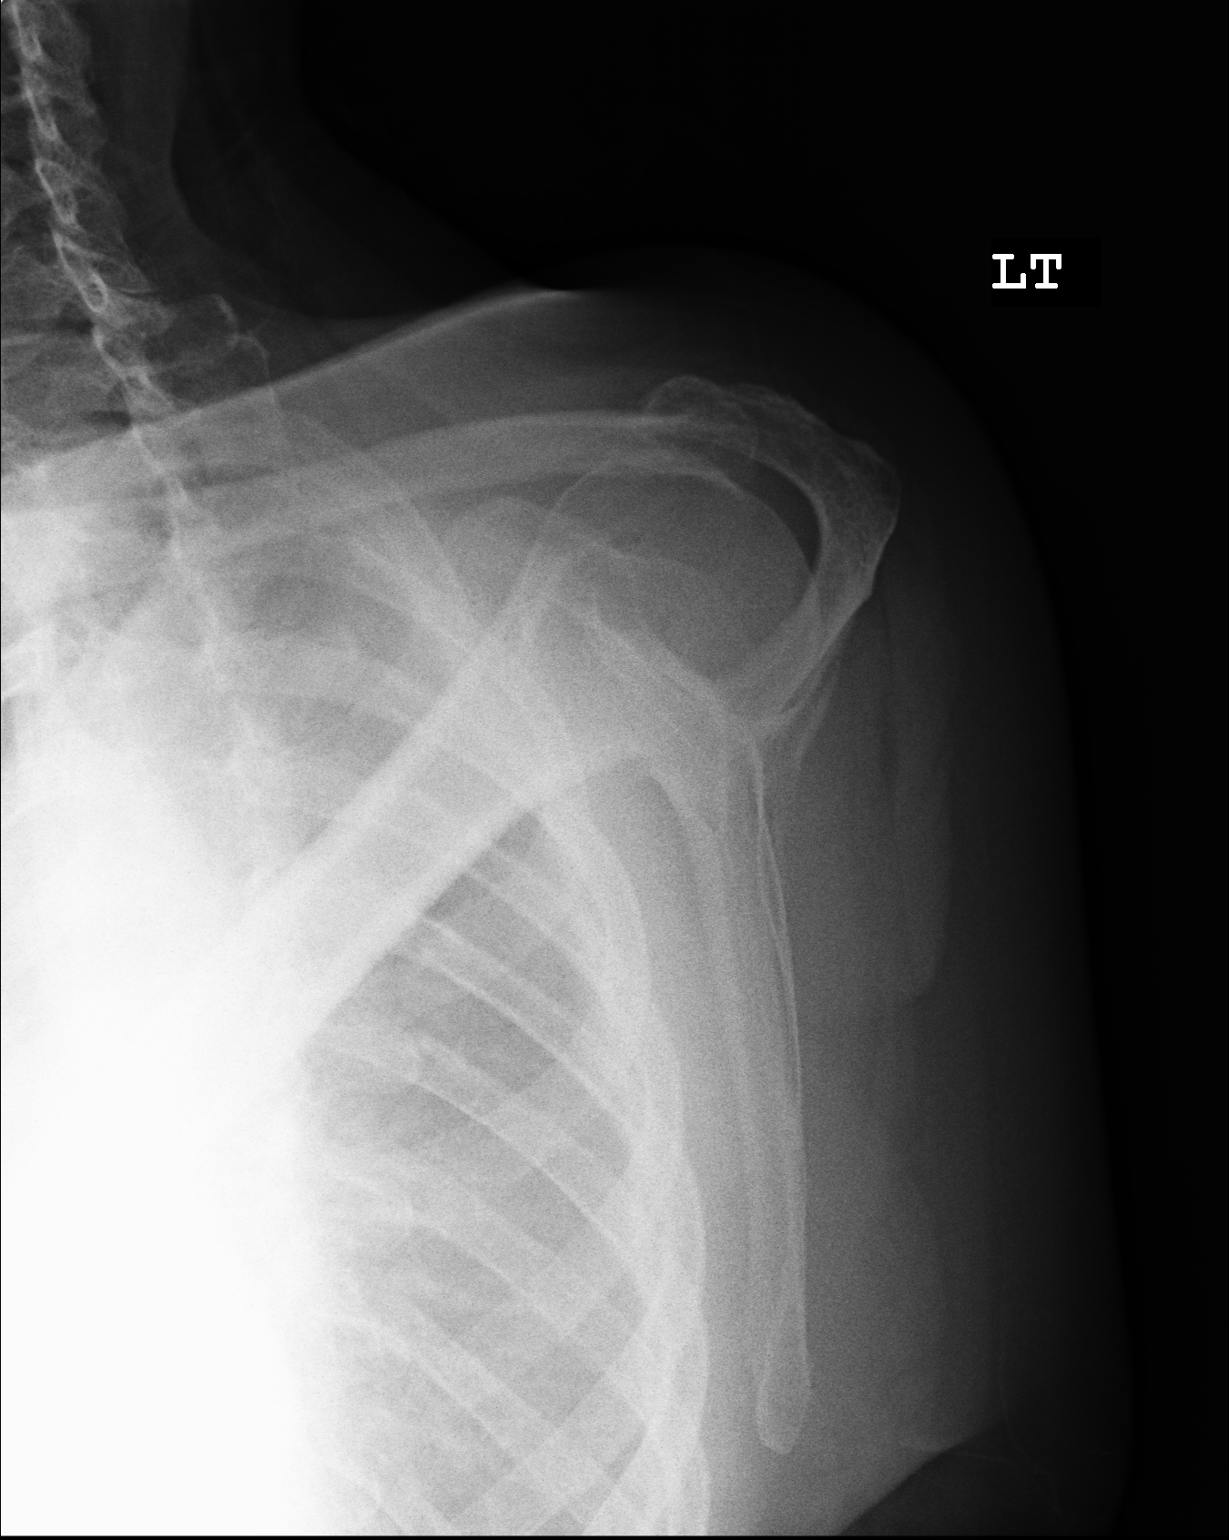

[3 of 3 positions shown; findings below may reference images not displayed]

FINDINGS: There is no acute fracture or subluxation. Glenohumeral articulation is well maintained. There is moderate acromioclavicular joint osteoarthritis. Visualized lung is clear. Surrounding soft tissues are unremarkable.
IMPRESSION: Moderate acromioclavicular joint osteoarthritis.

## 2021-06-28 IMAGING — MR MRI SHOULDER LT W/O CONTRAST
6 series · 39 of 40 positions shown · IV contrast (gadolinium)
Comparison: Radiographs dated 06/19/2021.

﻿EXAM:  [id number]   MRI SHOULDER LT W/O CONTRAST
INDICATION: Chronic left shoulder pain.
TECHNIQUE: Multiplanar, multisequential MRI of the left shoulder joint was performed without gadolinium contrast.

[Series 6: T1 · oblique · left · 3.5mm · 0.33mm/px · 6 of 21 slices shown]
[im 1/21]
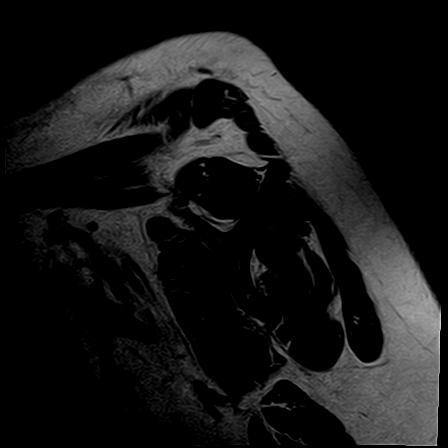
[im 5/21]
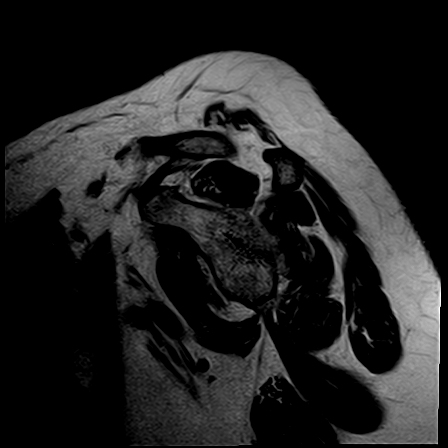
[im 9/21]
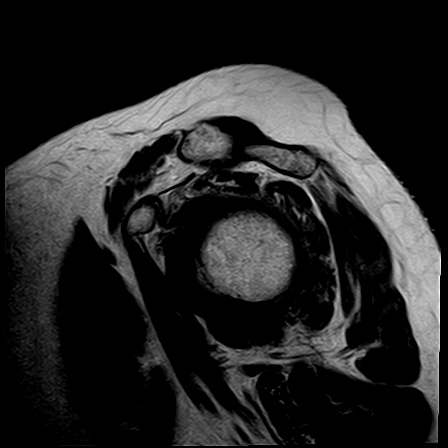
[im 13/21]
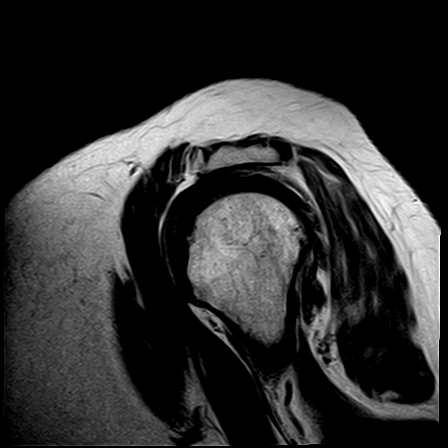
[im 17/21]
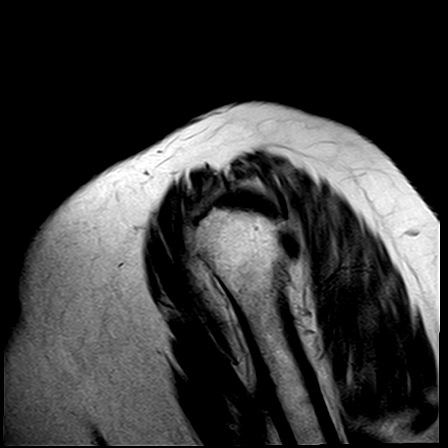
[im 21/21]
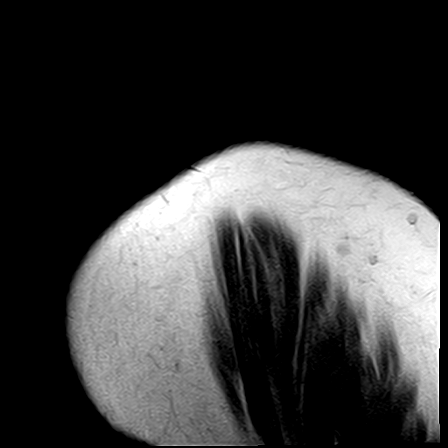

[Series 7: PD fat-sat · axial · left · 4.0mm · 0.50mm/px · z∈[-110,-27]mm · 6 of 21 slices shown (1 of 2)]
[im 1/21]
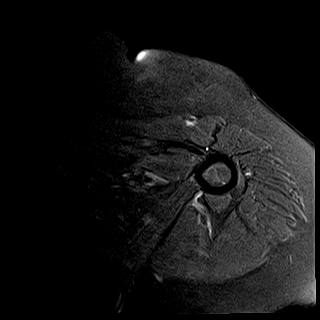
[im 5/21]
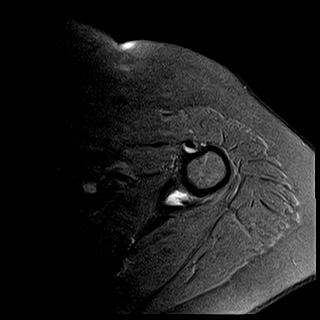
[im 9/21]
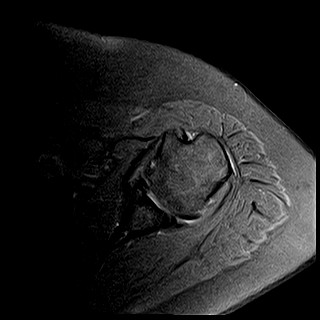
[im 13/21]
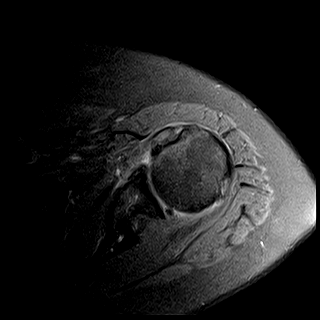
[im 17/21]
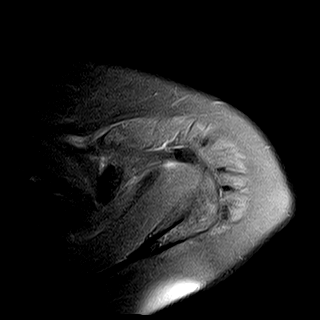
[im 21/21]
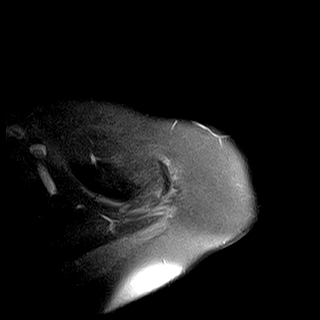

[Series 8: T2 fat-sat · axial · left · 4.0mm · 0.42mm/px · z∈[-110,-27]mm · 7 of 21 slices shown]
[im 1/21]
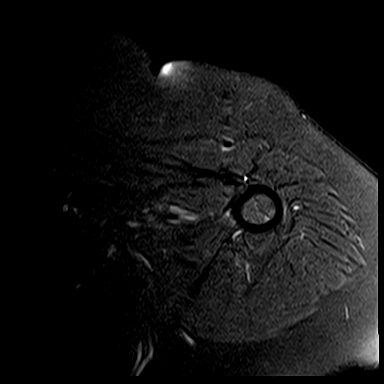
[im 4/21]
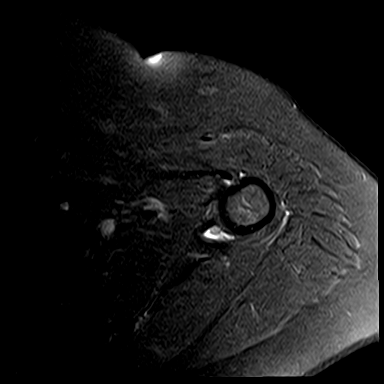
[im 7/21]
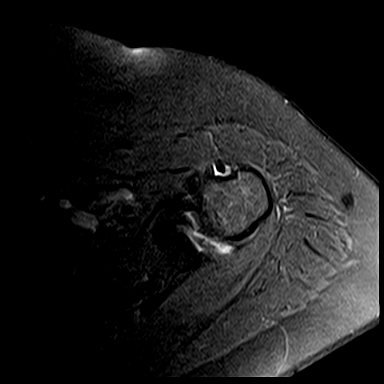
[im 11/21]
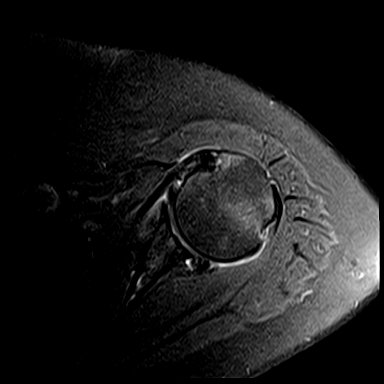
[im 14/21]
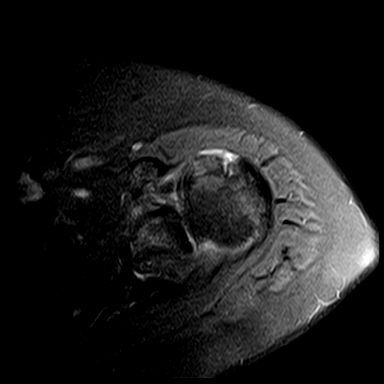
[im 17/21]
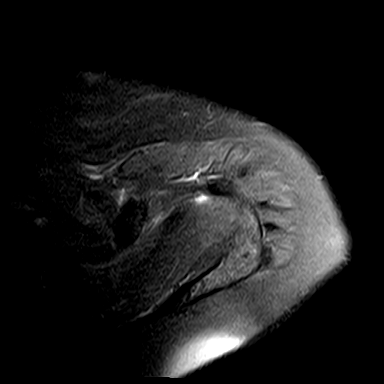
[im 21/21]
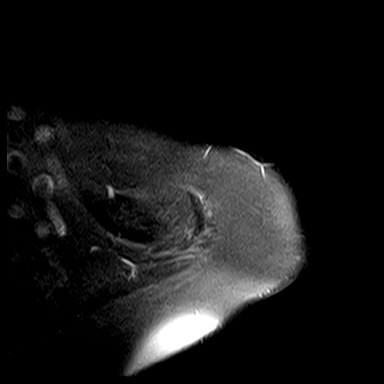

[Series 9: STIR · oblique · left · 3.5mm · 0.47mm/px · 7 of 21 slices shown (1 of 2)]
[im 1/21]
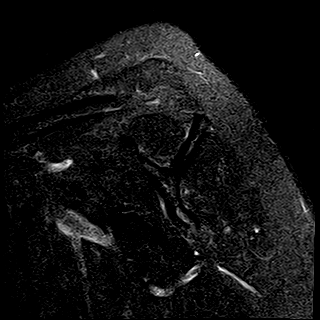
[im 4/21]
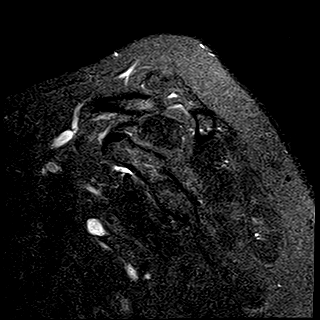
[im 7/21]
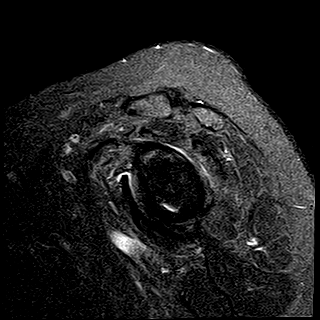
[im 11/21]
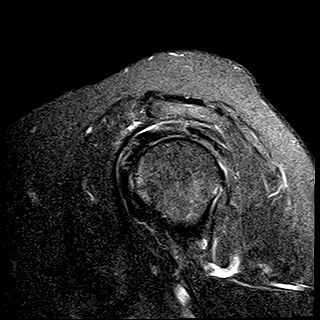
[im 14/21]
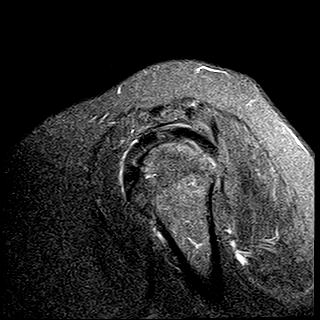
[im 17/21]
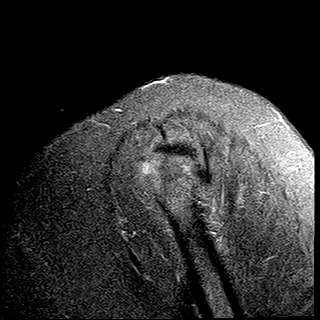
[im 21/21]
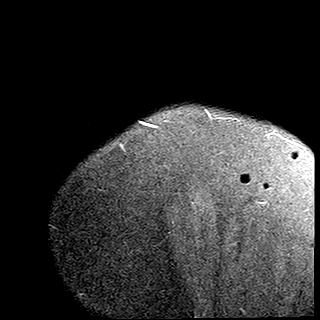

[Series 10: PD fat-sat · oblique · left · 3.5mm · 0.47mm/px · 7 of 21 slices shown (2 of 2)]
[im 1/21]
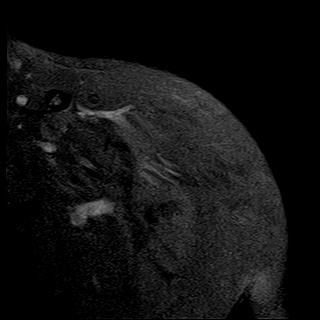
[im 4/21]
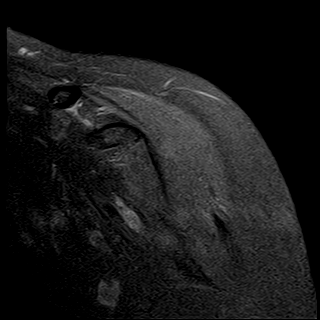
[im 7/21]
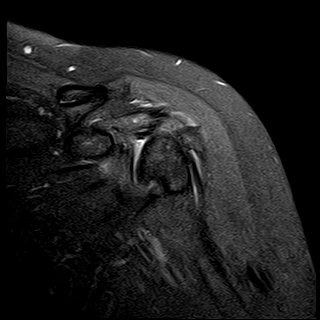
[im 11/21]
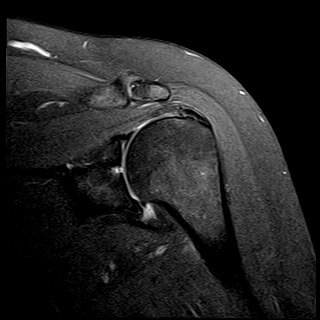
[im 14/21]
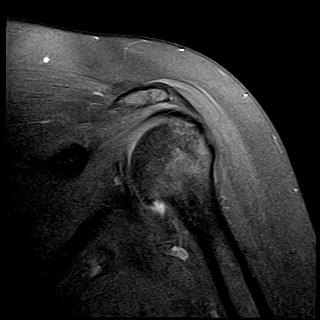
[im 17/21]
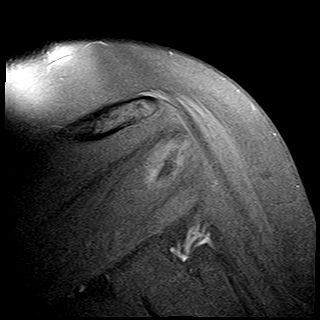
[im 21/21]
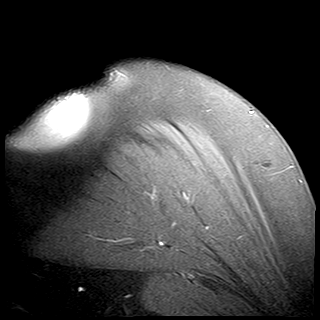

[Series 11: STIR · oblique · left · 3.5mm · 0.47mm/px · 6 of 21 slices shown (2 of 2)]
[im 1/21]
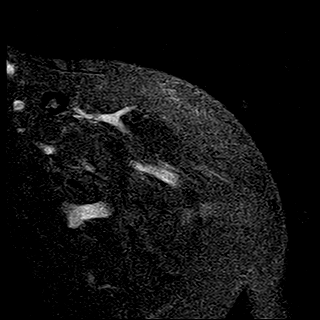
[im 4/21]
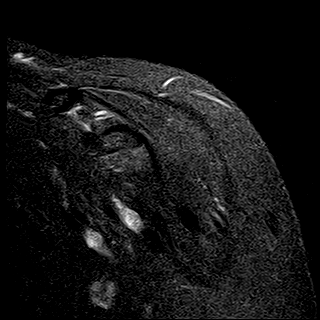
[im 7/21]
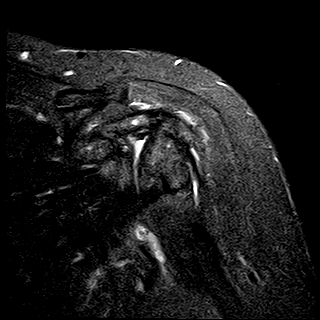
[im 11/21]
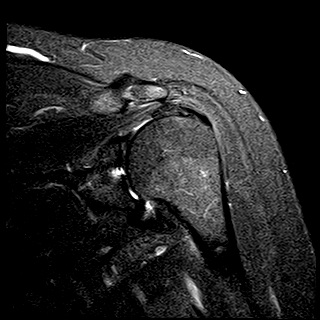
[im 14/21]
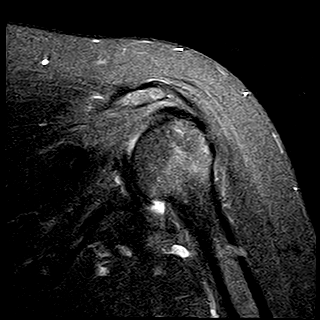
[im 17/21]
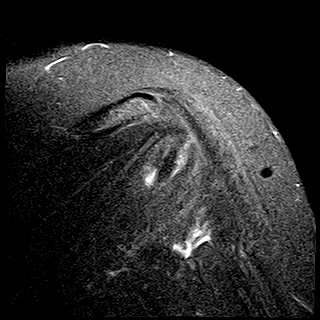

[39 of 40 positions shown; findings below may reference images not displayed]

FINDINGS: There is a full thickness supraspinatus tendon tear.  Infraspinatus, teres minor and subscapularis muscles and tendons are within normal limits in morphology and signal intensity.  Long head of the biceps tendon is well seated within the intertubercular groove and attaches normally to the biceps anchor.  There is suggestion of a superior and posterior labral tear.  Moderate glenohumeral articular cartilage thinning is also seen with mild subarticular cystic changes within the glenoid.  There is mild acromioclavicular joint osteoarthritis.  There is no significant fluid within the subacromial/subdeltoid bursa.  Muscle bulk and bone marrow signal intensity are normal.  No mass is seen along the course of the suprascapular nerve, within the spinoglenoid notch or within the quadrilateral space.
IMPRESSION: 1. Full thickness supraspinatus tendon tear.  

2. Suggestion of superior and posterior labral tears.  

3. Moderate glenohumeral articular cartilage thinning.  

4. Mild acromioclavicular joint osteoarthritis.

## 2021-09-10 IMAGING — MR MRI THORACIC SPINE WITHOUT CONTRAST
4 of 6 series · 28 of 48 positions shown · IV contrast (gadolinium)
Comparison: None available.

﻿EXAM:  [id number]   MRI THORACIC SPINE WITHOUT CONTRAST
INDICATION: Mid back pain.
TECHNIQUE: Multiplanar multisequential MRI of the thoracic spine was performed without gadolinium contrast.

[Series 5: T2 · sagittal · 4.0mm · 0.78mm/px · 8 of 13 slices shown (1 of 3)]
[im 1/13]
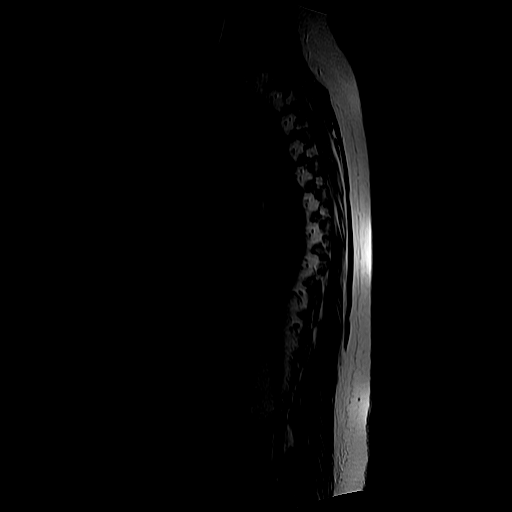
[im 2/13]
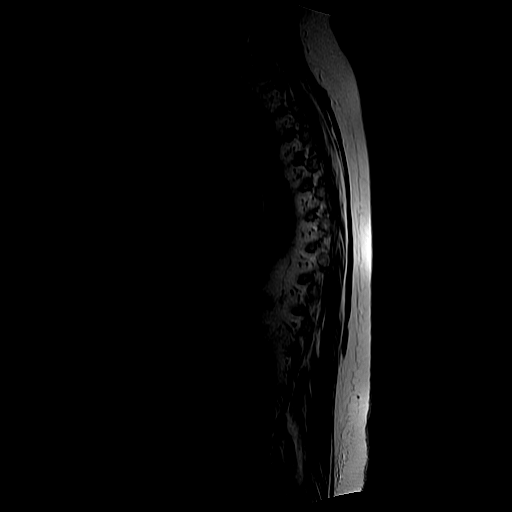
[im 4/13]
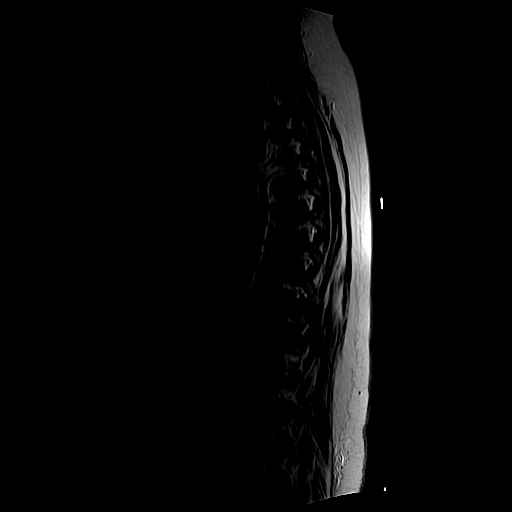
[im 6/13]
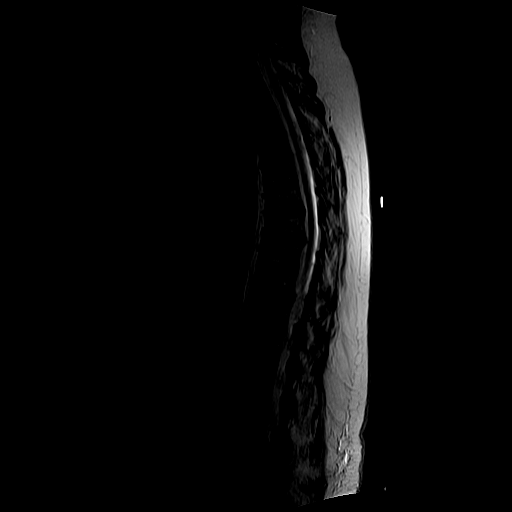
[im 7/13]
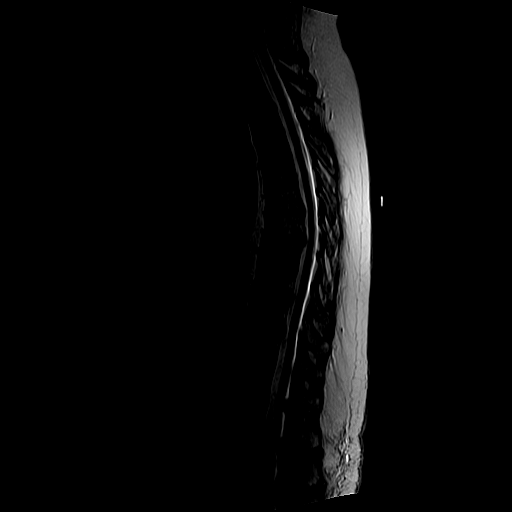
[im 9/13]
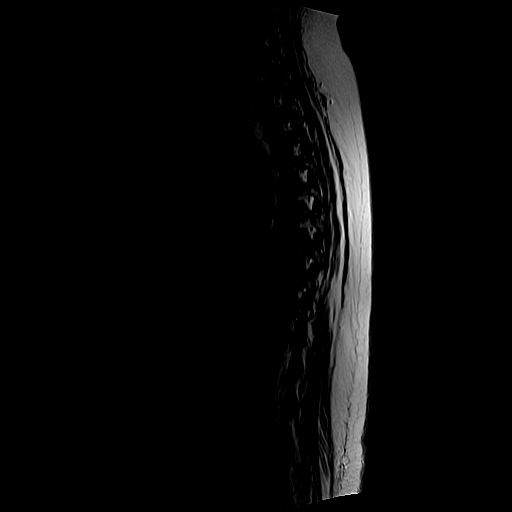
[im 11/13]
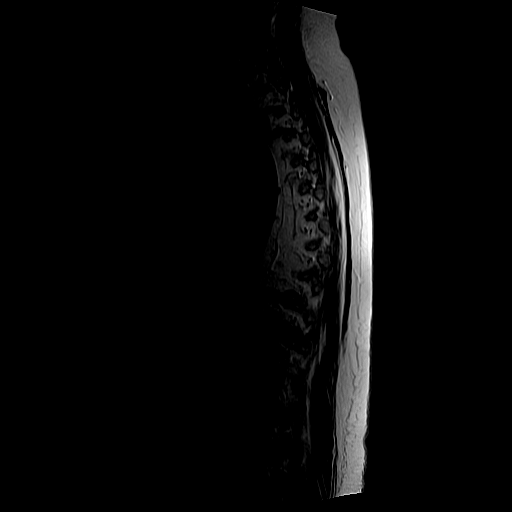
[im 13/13]
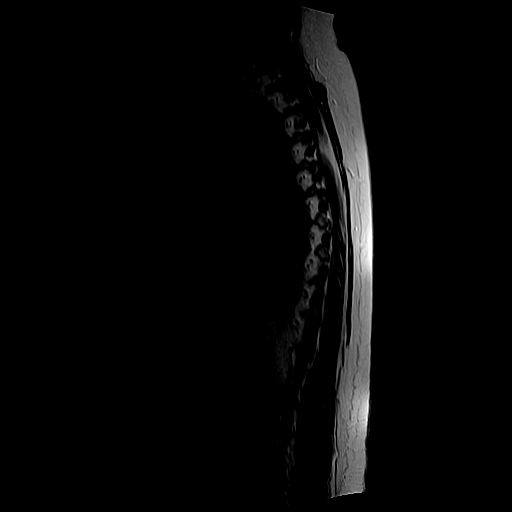

[Series 6: T1 · sagittal · 4.0mm · 0.78mm/px · 5 of 13 slices shown]
[im 1/13]
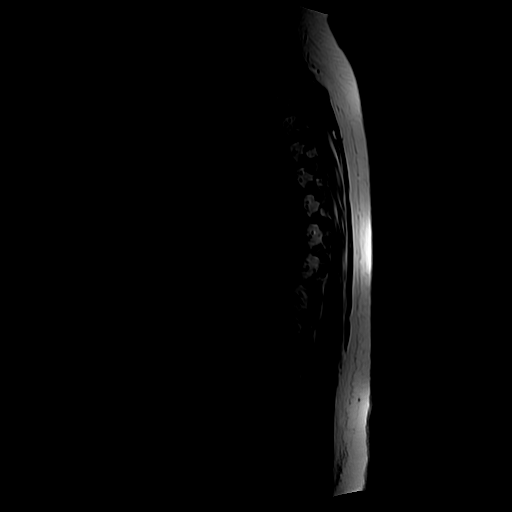
[im 2/13]
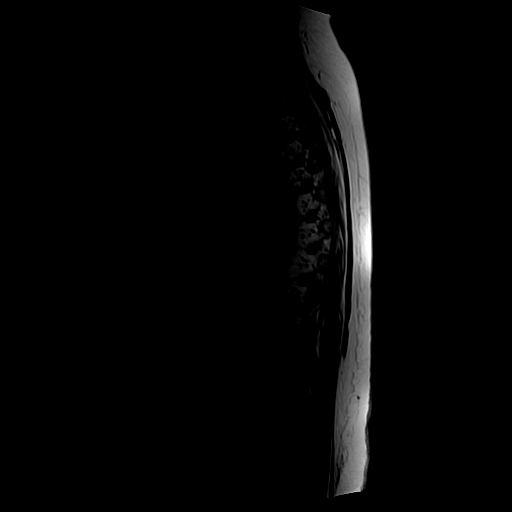
[im 4/13]
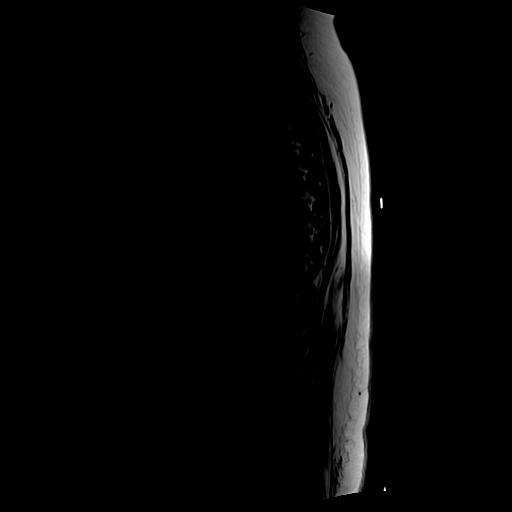
[im 7/13]
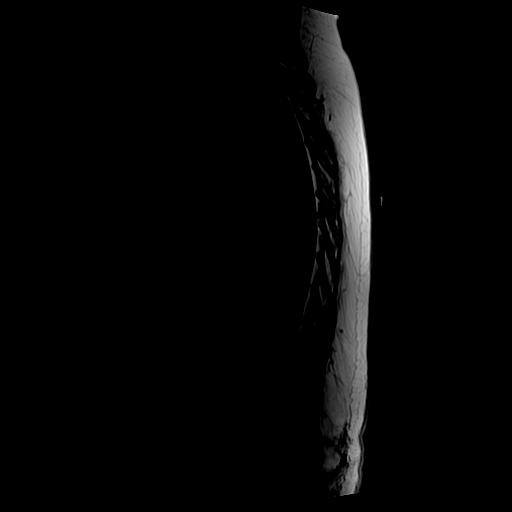
[im 11/13]
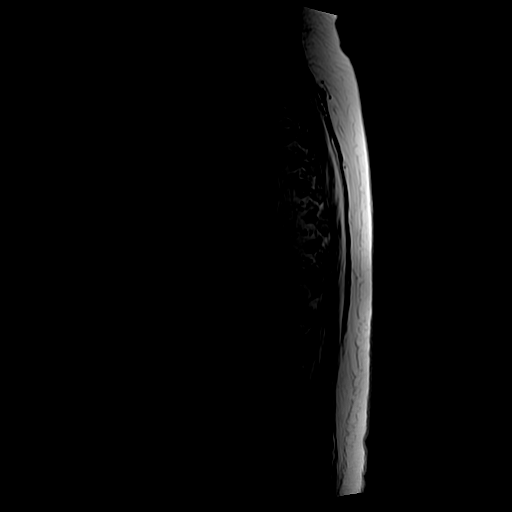

[Series 9: T2 · axial · 4.0mm · 0.62mm/px · z∈[-296,-110]mm · 7 of 12 slices shown (2 of 3)]
[im 1/12]
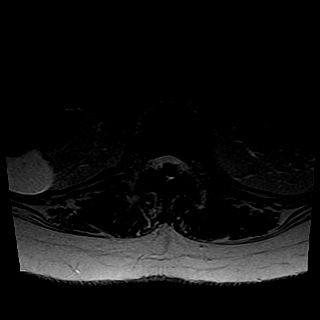
[im 2/12]
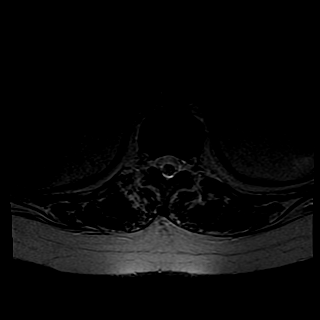
[im 4/12]
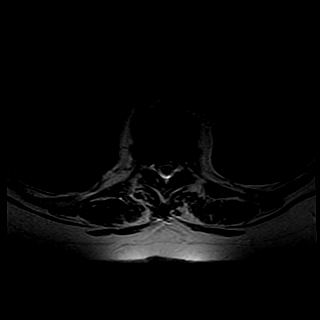
[im 6/12]
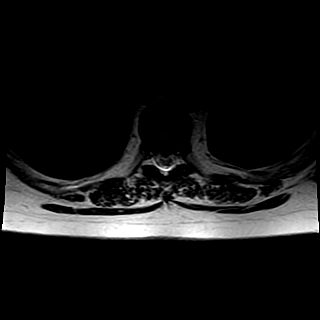
[im 8/12]
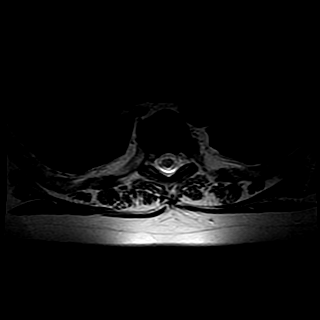
[im 10/12]
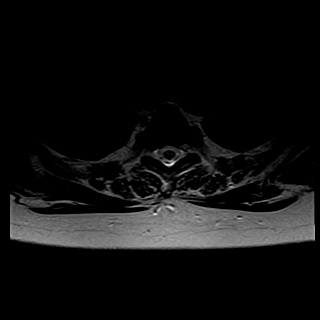
[im 12/12]
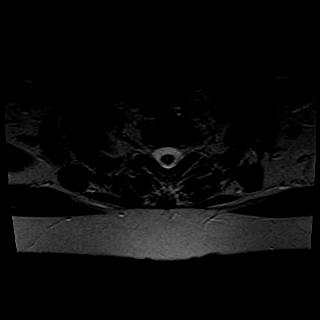

[Series 11: T2 · axial · 4.0mm · 0.62mm/px · z∈[-220,-145]mm · 8 of 16 slices shown (3 of 3)]
[im 1/16]
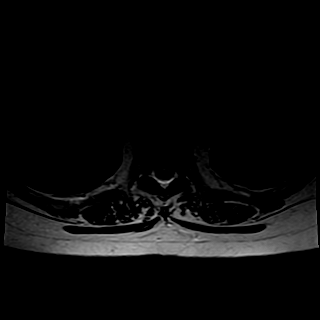
[im 2/16]
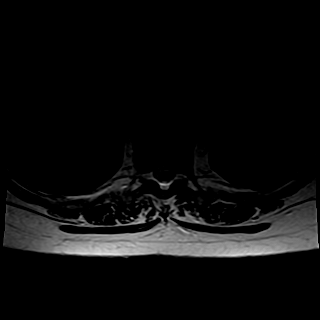
[im 6/16]
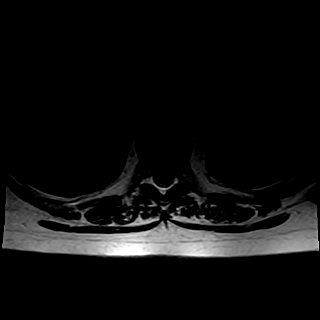
[im 7/16]
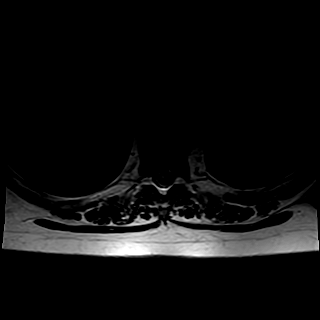
[im 9/16]
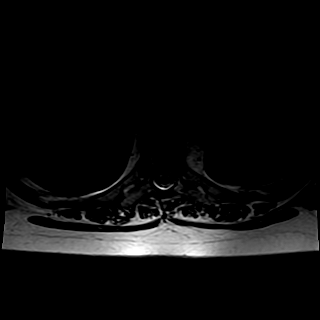
[im 11/16]
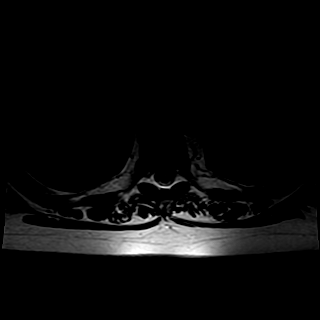
[im 14/16]
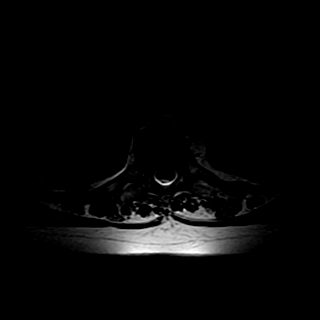
[im 16/16]
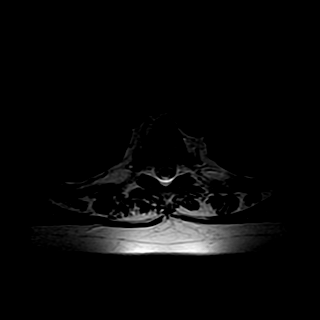

[28 of 48 positions shown; findings below may reference images not displayed]

FINDINGS: Vertebral bodies are normal in height, alignment and signal intensity. There is no acute fracture or subluxation. Visualized spinal cord is normal in signal intensity without evidence of compression at any level.

There are small central disc bulges at T6-7, T7-8, T9-10 and T10-11 levels, with near complete effacement of the ventral CSF at T7-8 level. There is also moderate bilateral neural foraminal stenosis at T10-11 level from facet arthropathy and bulging annulus.

Paraspinal soft tissues are unremarkable. There is no pleural effusion.
IMPRESSION: Mild multilevel degenerative changes as detailed above.

## 2021-10-08 IMAGING — US US ABDOMEN COMPLETE
1 series · 14 of 25 positions shown · non-contrast
Comparison: CT abdomen pelvis dated 04/30/2020.

﻿EXAM: US ABDOMEN COMPLETE
INDICATION: Right lower quadrant pain.

[Series 1: us abdomen complete · 14 of 53 slices shown]
[im 1/53]
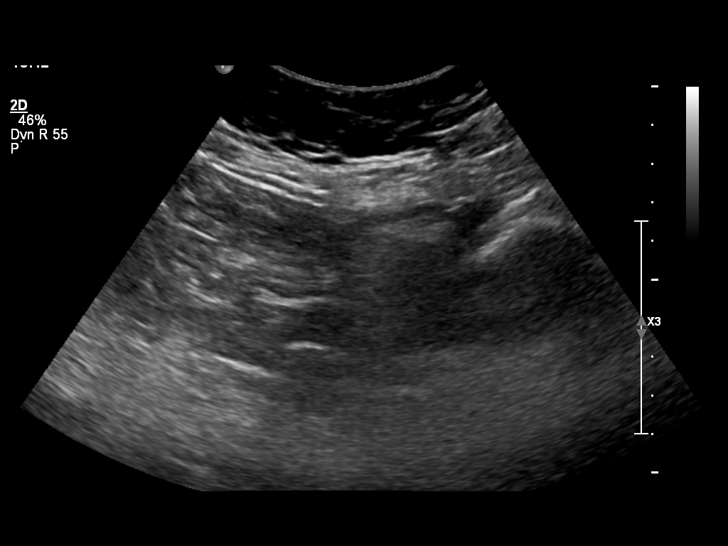
[im 5/53]
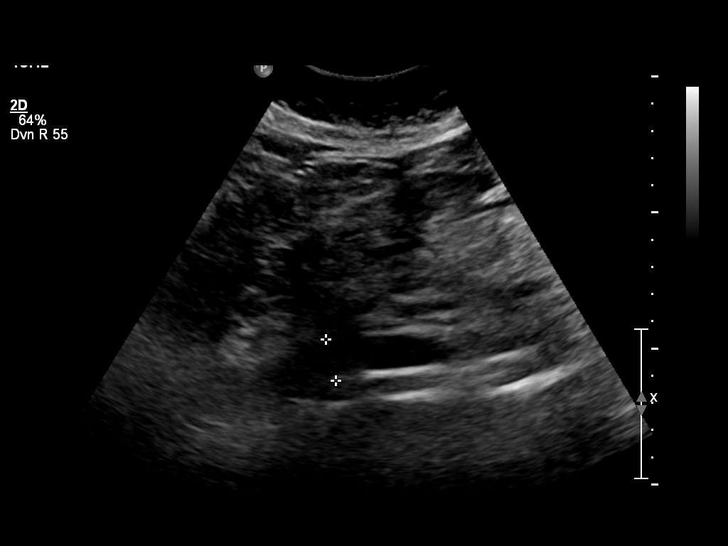
[im 9/53]
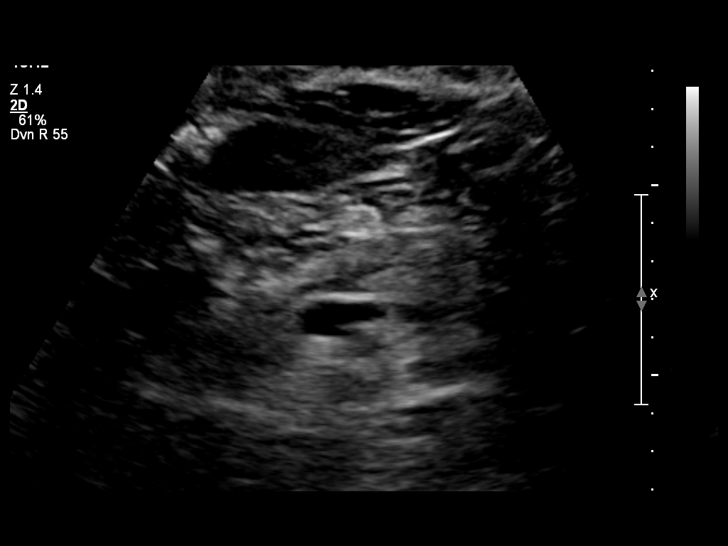
[im 14/53]
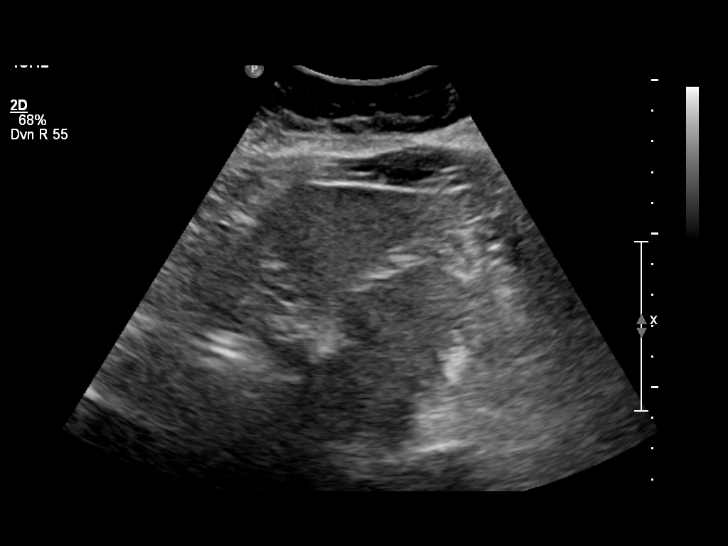
[im 18/53]
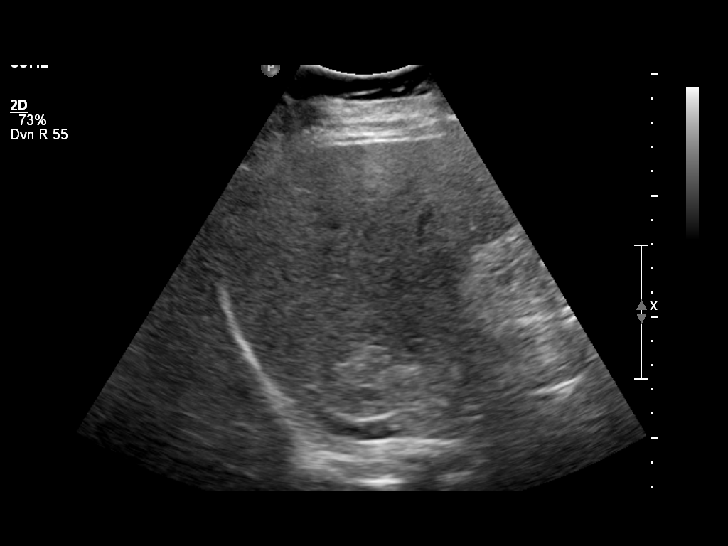
[im 20/53]
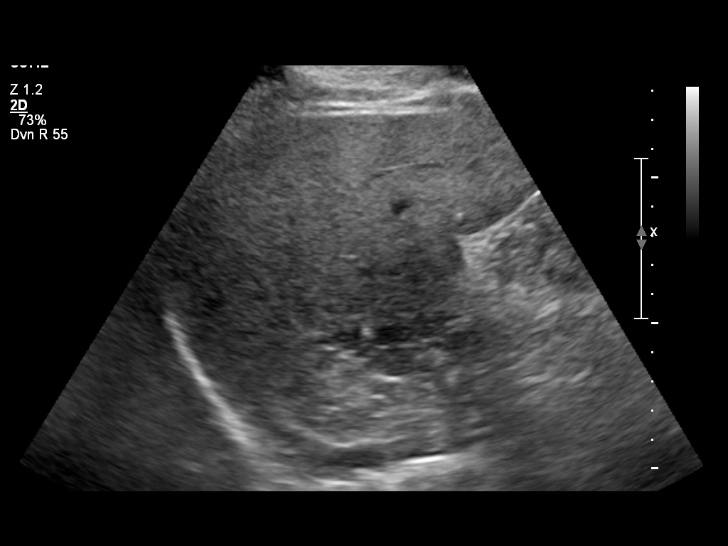
[im 24/53]
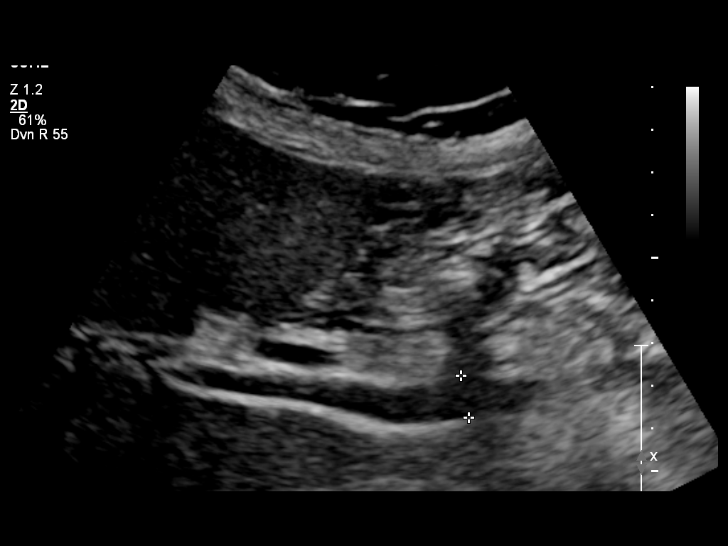
[im 29/53]
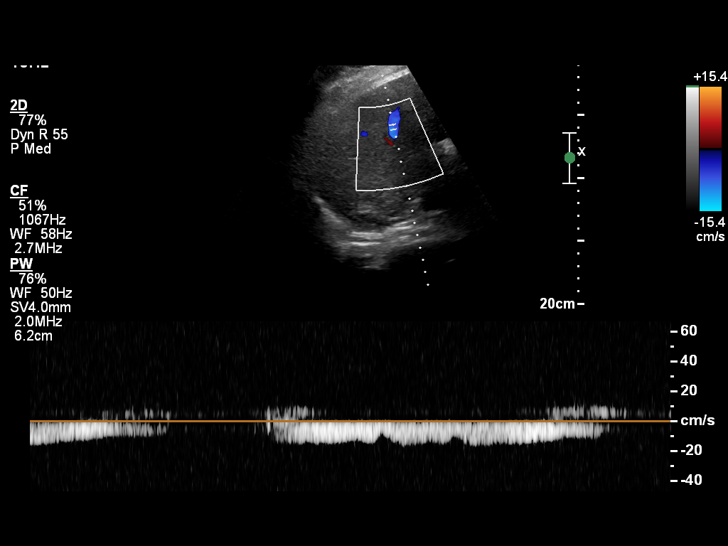
[im 33/53]
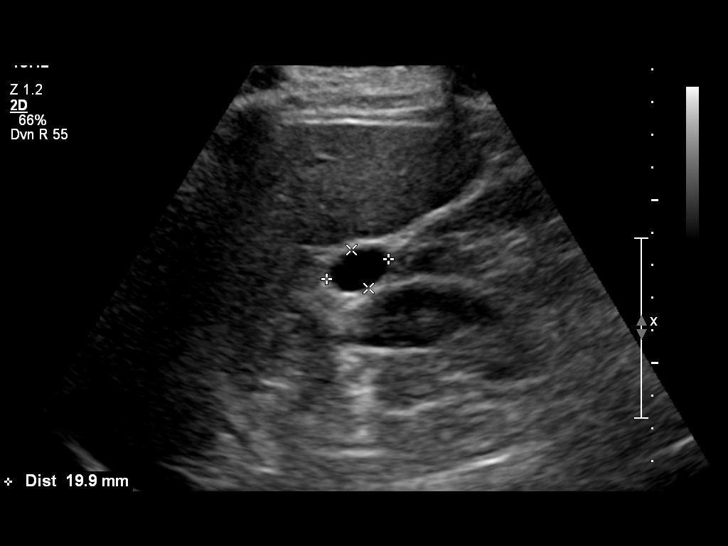
[im 35/53]
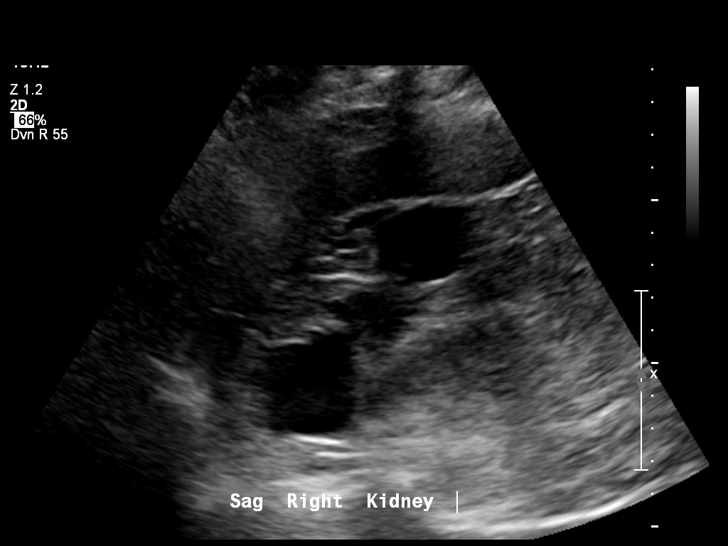
[im 40/53]
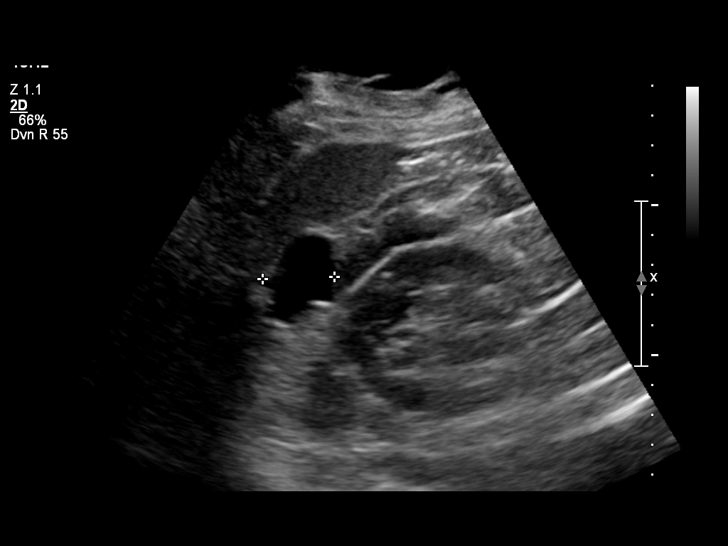
[im 44/53]
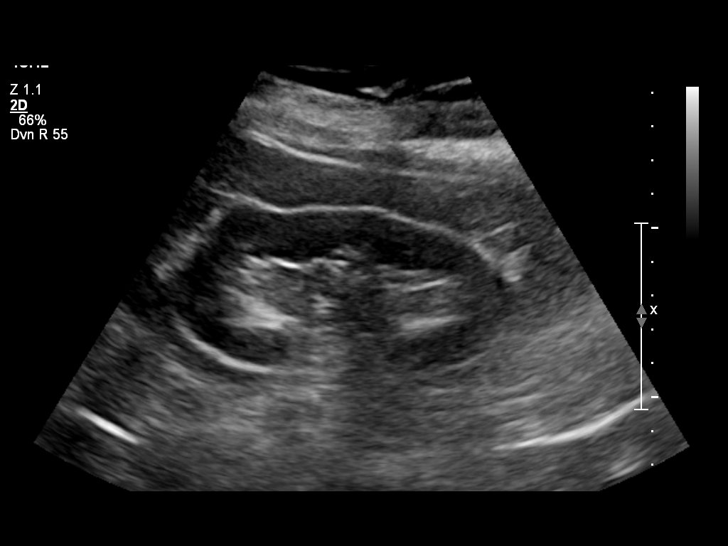
[im 48/53]
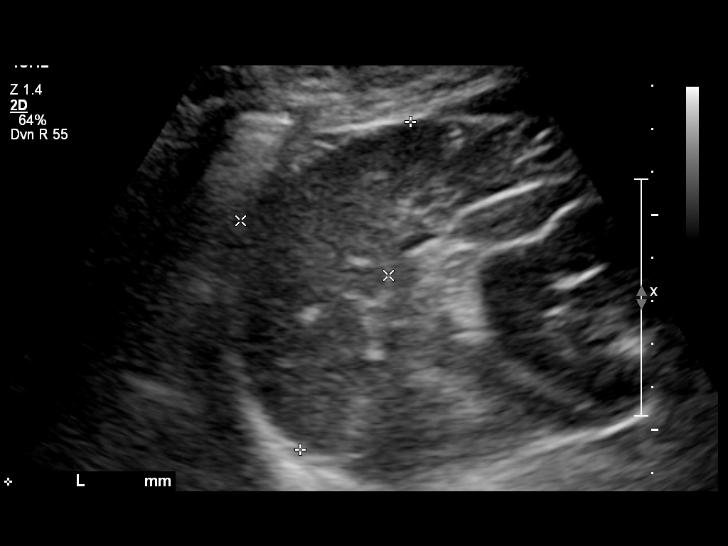
[im 53/53]
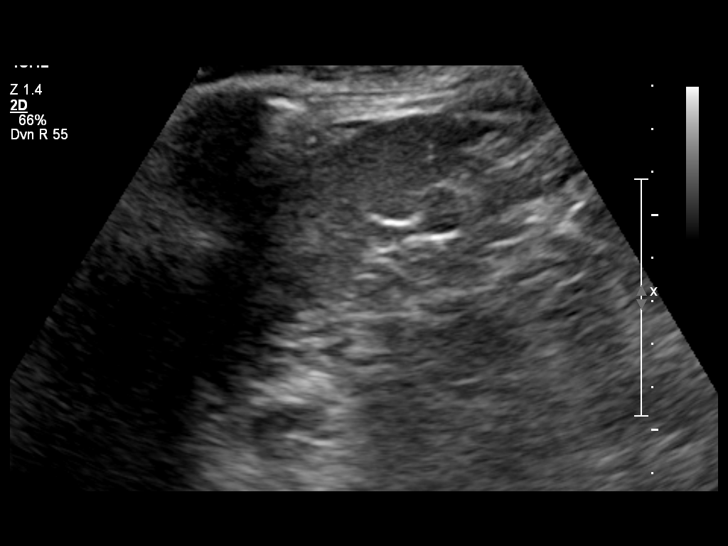

[14 of 25 positions shown; findings below may reference images not displayed]

FINDINGS: Liver is normal in echogenicity. There is no hepatic mass. There is no intra or extrahepatic biliary ductal dilation. Common bile duct measures 6 mm. Gallbladder is surgically absent. Pancreas is incompletely visualized due to artifact from overlying bowel gas. Spleen measures 8 cm and is unremarkable.

Kidneys are normal in echogenicity and measure 10.5 cm bilaterally. There are 3 right renal cysts measuring up to 3.5 cm. 

Visualized abdominal aorta is without aneurysmal dilatation. IVC is normal. There is no ascites.
IMPRESSION: 1. Unremarkable liver.

2. Prior cholecystectomy.

3. Pancreas incompletely visualized due to artifact from overlying bowel gas.

## 2022-03-02 ENCOUNTER — Other Ambulatory Visit (RURAL_HEALTH_CENTER): Payer: Self-pay | Admitting: Physician Assistant

## 2022-03-12 ENCOUNTER — Encounter (RURAL_HEALTH_CENTER): Payer: Self-pay | Admitting: Physician Assistant

## 2022-03-12 DIAGNOSIS — N182 Chronic kidney disease, stage 2 (mild): Secondary | ICD-10-CM | POA: Insufficient documentation

## 2022-03-12 DIAGNOSIS — I10 Essential (primary) hypertension: Secondary | ICD-10-CM | POA: Insufficient documentation

## 2022-03-12 DIAGNOSIS — F32A Depression, unspecified: Secondary | ICD-10-CM | POA: Insufficient documentation

## 2022-03-12 DIAGNOSIS — I951 Orthostatic hypotension: Secondary | ICD-10-CM

## 2022-03-12 DIAGNOSIS — M1990 Unspecified osteoarthritis, unspecified site: Secondary | ICD-10-CM | POA: Insufficient documentation

## 2022-03-12 DIAGNOSIS — F419 Anxiety disorder, unspecified: Secondary | ICD-10-CM | POA: Insufficient documentation

## 2022-03-12 DIAGNOSIS — E119 Type 2 diabetes mellitus without complications: Secondary | ICD-10-CM | POA: Insufficient documentation

## 2022-03-13 ENCOUNTER — Ambulatory Visit (RURAL_HEALTH_CENTER): Payer: Self-pay | Admitting: Physician Assistant

## 2022-03-14 ENCOUNTER — Ambulatory Visit (RURAL_HEALTH_CENTER): Payer: Medicare Other | Attending: Physician Assistant | Admitting: Physician Assistant

## 2022-03-14 ENCOUNTER — Other Ambulatory Visit: Payer: Self-pay

## 2022-03-14 ENCOUNTER — Ambulatory Visit: Payer: Medicare Other | Attending: Physician Assistant | Admitting: Physician Assistant

## 2022-03-14 ENCOUNTER — Encounter (RURAL_HEALTH_CENTER): Payer: Self-pay | Admitting: Physician Assistant

## 2022-03-14 VITALS — Temp 96.9°F | Ht 66.0 in | Wt 203.0 lb

## 2022-03-14 DIAGNOSIS — E1165 Type 2 diabetes mellitus with hyperglycemia: Secondary | ICD-10-CM | POA: Insufficient documentation

## 2022-03-14 DIAGNOSIS — R2681 Unsteadiness on feet: Secondary | ICD-10-CM

## 2022-03-14 DIAGNOSIS — H409 Unspecified glaucoma: Secondary | ICD-10-CM | POA: Insufficient documentation

## 2022-03-14 DIAGNOSIS — Z7984 Long term (current) use of oral hypoglycemic drugs: Secondary | ICD-10-CM | POA: Insufficient documentation

## 2022-03-14 DIAGNOSIS — I959 Hypotension, unspecified: Secondary | ICD-10-CM

## 2022-03-14 DIAGNOSIS — M791 Myalgia, unspecified site: Secondary | ICD-10-CM | POA: Insufficient documentation

## 2022-03-14 DIAGNOSIS — F32A Depression, unspecified: Secondary | ICD-10-CM | POA: Insufficient documentation

## 2022-03-14 DIAGNOSIS — G8929 Other chronic pain: Secondary | ICD-10-CM | POA: Insufficient documentation

## 2022-03-14 DIAGNOSIS — Z79899 Other long term (current) drug therapy: Secondary | ICD-10-CM | POA: Insufficient documentation

## 2022-03-14 DIAGNOSIS — M75102 Unspecified rotator cuff tear or rupture of left shoulder, not specified as traumatic: Secondary | ICD-10-CM | POA: Insufficient documentation

## 2022-03-14 DIAGNOSIS — I10 Essential (primary) hypertension: Secondary | ICD-10-CM | POA: Insufficient documentation

## 2022-03-14 DIAGNOSIS — F419 Anxiety disorder, unspecified: Secondary | ICD-10-CM | POA: Insufficient documentation

## 2022-03-14 DIAGNOSIS — R42 Dizziness and giddiness: Secondary | ICD-10-CM | POA: Insufficient documentation

## 2022-03-14 DIAGNOSIS — R2689 Other abnormalities of gait and mobility: Secondary | ICD-10-CM | POA: Insufficient documentation

## 2022-03-14 DIAGNOSIS — M797 Fibromyalgia: Secondary | ICD-10-CM | POA: Insufficient documentation

## 2022-03-14 DIAGNOSIS — E119 Type 2 diabetes mellitus without complications: Secondary | ICD-10-CM

## 2022-03-14 LAB — CBC WITH DIFF
BASOPHIL #: 0 10*3/uL (ref 0.00–2.50)
BASOPHIL %: 1 % (ref 0–3)
EOSINOPHIL #: 0.3 10*3/uL (ref 0.00–2.40)
EOSINOPHIL %: 5 % (ref 0–7)
HCT: 38.5 % (ref 37.0–47.0)
HGB: 13.3 g/dL (ref 12.5–16.0)
LYMPHOCYTE #: 2.1 10*3/uL (ref 2.10–11.00)
LYMPHOCYTE %: 34 % (ref 25–45)
MCH: 34.2 pg — ABNORMAL HIGH (ref 27.0–32.0)
MCHC: 34.7 g/dL (ref 32.0–36.0)
MCV: 98.6 fL (ref 78.0–99.0)
MONOCYTE #: 0.4 10*3/uL (ref 0.00–4.10)
MONOCYTE %: 7 % (ref 0–12)
MPV: 10.7 fL — ABNORMAL HIGH (ref 7.4–10.4)
NEUTROPHIL #: 3.3 10*3/uL — ABNORMAL LOW (ref 4.10–29.00)
NEUTROPHIL %: 54 % (ref 40–76)
PLATELETS: 203 10*3/uL (ref 140–440)
RBC: 3.9 10*6/uL — ABNORMAL LOW (ref 4.20–5.40)
RDW: 12.6 % (ref 11.6–14.8)
WBC: 6.2 10*3/uL (ref 4.0–10.5)
WBCS UNCORRECTED: 6.2 10*3/uL

## 2022-03-14 LAB — COMPREHENSIVE METABOLIC PNL, FASTING
ALBUMIN/GLOBULIN RATIO: 1.4 (ref 0.8–1.4)
ALBUMIN: 3.9 g/dL (ref 3.5–5.7)
ALKALINE PHOSPHATASE: 69 U/L (ref 34–104)
ALT (SGPT): 10 U/L (ref 7–52)
ANION GAP: 5 mmol/L — ABNORMAL LOW (ref 10–20)
AST (SGOT): 17 U/L (ref 13–39)
BILIRUBIN TOTAL: 0.2 mg/dL — ABNORMAL LOW (ref 0.3–1.2)
BUN/CREA RATIO: 16 (ref 6–22)
BUN: 16 mg/dL (ref 7–25)
CALCIUM, CORRECTED: 9.3 mg/dL (ref 8.9–10.8)
CALCIUM: 9.2 mg/dL (ref 8.6–10.3)
CHLORIDE: 106 mmol/L (ref 98–107)
CO2 TOTAL: 27 mmol/L (ref 21–31)
CREATININE: 0.98 mg/dL (ref 0.60–1.30)
ESTIMATED GFR: 61 mL/min/{1.73_m2} (ref >59)
GLOBULIN: 2.8 — ABNORMAL LOW (ref 2.9–5.4)
GLUCOSE: 188 mg/dL — ABNORMAL HIGH (ref 74–109)
OSMOLALITY, CALCULATED: 282 mOsm/kg (ref 270–290)
POTASSIUM: 5.3 mmol/L — ABNORMAL HIGH (ref 3.5–5.1)
PROTEIN TOTAL: 6.7 g/dL (ref 6.4–8.9)
SODIUM: 138 mmol/L (ref 136–145)

## 2022-03-14 LAB — URINE DRUG SCREEN
AMPHET QL: NEGATIVE
BARB QL: NEGATIVE
BENZO QL: POSITIVE — AB
BUP QL: NEGATIVE
CANNAQL: NEGATIVE
COCQL: NEGATIVE
METHQL: NEGATIVE
OPIATE: POSITIVE — AB
OXYCODONE URINE: NEGATIVE
PCP QL: NEGATIVE
TRICYCLIC ANTIDEPRESSANTS URINE: NEGATIVE

## 2022-03-14 LAB — URINALYSIS, MACROSCOPIC
BILIRUBIN: NEGATIVE mg/dL
BLOOD: NEGATIVE mg/dL
GLUCOSE: NEGATIVE mg/dL
LEUKOCYTES: 250 WBCs/uL — AB
NITRITE: NEGATIVE
PH: 5.5 (ref 5.0–9.0)
PROTEIN: 20 mg/dL
SPECIFIC GRAVITY: 1.033 — ABNORMAL HIGH (ref 1.002–1.030)
UROBILINOGEN: NORMAL mg/dL

## 2022-03-14 LAB — URINALYSIS, MICROSCOPIC
RBCS: 1 /hpf (ref <4)
SQUAMOUS EPITHELIAL: 5 /hpf (ref <28)
WBCS: 23 /hpf — ABNORMAL HIGH (ref <6)

## 2022-03-14 LAB — CREATINE KINASE (CK), TOTAL, SERUM: CREATINE KINASE: 43 U/L (ref 30–223)

## 2022-03-14 LAB — MAGNESIUM: MAGNESIUM: 1.8 mg/dL — ABNORMAL LOW (ref 1.9–2.7)

## 2022-03-14 LAB — MICROALBUMIN URINE, RANDOM: MICROALBUMIN RANDOM URINE: 20.1 mg/dL

## 2022-03-14 MED ORDER — LEVETIRACETAM ER 500 MG TABLET,EXTENDED RELEASE 24 HR
1.0000 | ORAL_TABLET | Freq: Every evening | ORAL | 1 refills | Status: DC
Start: 2022-03-14 — End: 2022-03-17

## 2022-03-14 MED ORDER — HYDROCODONE 7.5 MG-ACETAMINOPHEN 325 MG TABLET
1.0000 | ORAL_TABLET | Freq: Four times a day (QID) | ORAL | 0 refills | Status: DC | PRN
Start: 2022-03-14 — End: 2022-04-17

## 2022-03-14 NOTE — Progress Notes (Unsigned)
INTERNAL MEDICINE, [facility]  [address]  [place] [address]  Operated by [facility]  History and Physical     Name: Grace Hoffman MRN:  [id number]   Date: 03/14/2022 Age: 71 y.o.               Follow Up        Reason for Visit: Follow Up, Medication Refill, and Dizziness (X 1 month )    History of Present Illness  Grace Hoffman is a 71 y.o. female who is being seen today for ***    Patient here for follow-up today as well as acute complaints  Patient complaining of dizziness questionable vertigo x1 month; states symptoms are worse when she stands but denies syncope; tells me today that at times she feels there is a ball-like object in her head that just goes around and around; she did buy some meclizine over-the-counter which helped the dizziness; it is noted today blood pressure was stable initially 126/62 but orthostatic showed lying 112/60 sitting 116/64 and standing 92/62 at which time patient became dizzy; she is currently only on enalapril/HCTZ 5-12.5 mg daily; patient does see Dr. Aretha Parrot due to her history of migraines and states last eval less than 6 months ago    Is is also complaining as well of low blood sugar readings with this morning sugar 55 yesterday 77; she states most of her readings have been between 55 and 77 consistently; it is noted however patient has a history of uncontrolled diabetes with hemoglobin A1c as high as 8.7 however last was 6.9; patient is on metformin at 1000 mg twice daily along with glimepiride 4 mg twice daily; previously she was unable to tolerate being injectables such as Ozempic and Mounjaro; follow-up labs needed today    Patient also complaining of dysuria with frequency pressure and history of recurrent UTIs; she is currently seeing urologist and goes back in April; at this time no fever chills nausea vomiting noted    Follow-up blood pressure/hypertension with blood pressure today little higher than  normal 144/72 however repeat 130/82; patient does monitor at home from time to time states that it is always 120-130/70-80; denies chest pain headaches dizziness    Follow-up chronic pain/DJD/fibromyalgia for which she has been on chronic pain therapy/narcotics for numerous years; pain is stable at this current time on dosing no abuse history is noted; patient also compliant w UDS; patient states that she had a MRI on her back and she states that she took it to Dr. Vear Clock and Dr. Vear Clock thinks that she has a bulging disc in her thoracic region; requesting medication refill    Follow-up chronic depression/anxiety with patient states she is doing well;  she continues to take her Valium twice a day as needed for anxiety; currently denies any panic attacks suicidal thoughts ideation    Chronic fatigue issues noted with history of low B12 requesting injection today also    History of glaucoma; continues to follow with ophthalmologist    Also continues to follow with orthopedic Dr. Vear Clock in [place] due to noted  left shoulder pain with decreased range of motion and rotator cuff tear    Patient is up-to-date on her COVID booster as well as flu vaccination        PHQ Questionnaire  Little interest or pleasure in doing things.: Not at all  Feeling down, depressed, or hopeless: Not at all  PHQ 2 Total: 0  Trouble falling or staying asleep, or sleeping too much.: Not at all  Feeling tired or having little energy: Not at all  Poor appetite or overeating: Not at all  Feeling bad about yourself/ that you are a failure in the past 2 weeks?: Not at all  Trouble concentrating on things in the past 2 weeks?: Not at all  Moving/Speaking slowly or being fidgety or restless  in the past 2 weeks?: Not at all  Thoughts that you would be better off DEAD, or of hurting yourself in some way.: Not at all  PHQ 9 Total: 0            Past Medical History:   Diagnosis Date    Anxiety     Chronic back pain     COVID-19 vaccine series  completed     Depression     Diabetes mellitus, type 2 (CMS HCC)     Fibromyalgia affecting multiple sites     Hypertension     Hypomagnesemia     Insomnia disorder     Mixed hyperlipidemia     Orthostatic hypotension     Osteoarthritis     Unspecified glaucoma(365.9)     Vitamin D deficiency          Past Surgical History:   Procedure Laterality Date    HX CHOLECYSTECTOMY      HX KNEE REPLACMENT Left       Family Medical History:     Family history is unknown by patient.          Social History     Tobacco Use    Smoking status: Never    Smokeless tobacco: Never   Substance Use Topics    Alcohol use: Never    Drug use: Never     Medication:  d-mannose 500 mg Oral Capsule, Take 1 Capsule (500 mg total) by mouth Twice daily  diazePAM (VALIUM) 5 mg Oral Tablet, Take 1 Tablet (5 mg total) by mouth Twice per day as needed  enalapril (VASOTEC) 5 mg Oral Tablet, Take 1 Tablet (5 mg total) by mouth Once a day  ergocalciferol, vitamin D2, (DRISDOL) 1,250 mcg (50,000 unit) Oral Capsule, Take 1 Capsule (50,000 Units total) by mouth Every 7 days  famotidine (PEPCID) 20 mg Oral Tablet, Take 1 Tablet (20 mg total) by mouth Every evening  glimepiride (AMARYL) 4 mg Oral Tablet, Take 1 Tablet (4 mg total) by mouth Twice daily  latanoprost (XALATAN) 0.005 % Ophthalmic Drops, Administer 1 Drop into the left eye Every night  magnesium oxide (MAG-OX) 400 mg Oral Tablet, Take 2 Tablets (800 mg total) by mouth Once a day  metFORMIN (GLUCOPHAGE) 500 mg Oral Tablet, Take 2 Tablets (1,000 mg total) by mouth Every morning with breakfast  rosuvastatin (CRESTOR) 10 mg Oral Tablet, Take 1 Tablet (10 mg total) by mouth Once a day  SUMAtriptan (IMITREX) 50 mg Oral Tablet, sumatriptan 50 mg tablet   TAKE 1 TABLET BY MOUTH AT ONSET OF HEADACHE. IF NO RELIEF MAY REPEAT 1 AFTER AT LEAST 2 HOURS. MAX OF 4 TABS PER 24 HOURS  venlafaxine (EFFEXOR XR) 37.5 mg Oral Capsule, Sust. Release 24 hr, Take 1 Capsule (37.5 mg total) by mouth  Every night  citalopram (CELEXA) 20 mg Oral Tablet, Take 1 Tablet (20 mg total) by mouth Once a day (Patient not taking: Reported on 03/14/2022)  dapagliflozin (FARXIGA) 5 mg Oral Tablet, Take 1 Tablet (5 mg total) by mouth Once a day (Patient not taking:  Reported on 03/14/2022)  Enalapril-Hydrochlorothiazide (VASERETIC) 10-25 mg Oral Tablet, TAKE 1 TABLET BY MOUTH EVERY DAY FOR HIGH BLOOD PRESSURE (Patient not taking: Reported on 03/14/2022)  HYDROcodone-acetaminophen (NORCO) 7.5-325 mg Oral Tablet, Take 1 Tablet by mouth Every 4 hours as needed  Levetiracetam 500 mg Oral Tablet Sustained Release 24 hr, Take 1 Tablet (500 mg total) by mouth Every night    No facility-administered medications prior to visit.    Allergies:  Allergies   Allergen Reactions    Codeine Nausea/ Vomiting    Amoxicillin Diarrhea       Physical Exam:  Vitals:    03/14/22 0919   BP: 122/84   Pulse: 82   Temp: 36.1 C (96.9 F)   TempSrc: Tympanic   SpO2: 96%   Weight: 92.1 kg (203 lb)   Height: 1.676 m (5\' 6" )   BMI: 32.83      Physical Exam  Vitals and nursing note reviewed.   Constitutional:       Appearance: Normal appearance.   HENT:      Right Ear: Tympanic membrane normal.      Left Ear: Tympanic membrane normal.      Mouth/Throat:      Mouth: Mucous membranes are moist.      Pharynx: Oropharynx is clear.   Eyes:      Pupils: Pupils are equal, round, and reactive to light.   Cardiovascular:      Rate and Rhythm: Normal rate and regular rhythm.      Pulses: Normal pulses.   Pulmonary:      Effort: Pulmonary effort is normal.      Breath sounds: Normal breath sounds.   Abdominal:      General: Bowel sounds are normal.      Palpations: Abdomen is soft.      Tenderness: There is no abdominal tenderness.   Musculoskeletal:         General: No swelling or tenderness. Normal range of motion.      Cervical back: Normal range of motion and neck supple.   Skin:     General: Skin is warm.      Findings: No lesion or rash.   Neurological:       General: No focal deficit present.      Mental Status: She is alert and oriented to person, place, and time.   Psychiatric:         Mood and Affect: Mood normal.         Behavior: Behavior normal.         Thought Content: Thought content normal.         Judgment: Judgment normal.         Assessment/Plan:  Problem List Items Addressed This Visit        Cardiovascular System    Hypertension - Primary       Endocrine    Diabetes mellitus, type 2 (CMS HCC)   Other Visit Diagnoses     Uncontrolled type 2 diabetes mellitus with hyperglycemia (CMS HCC)        Relevant Orders    CBC/DIFF    COMPREHENSIVE METABOLIC PNL, FASTING    URINALYSIS, MACROSCOPIC AND MICROSCOPIC W/CULTURE REFLEX    MICROALBUMIN URINE, RANDOM    Hypomagnesemia        Relevant Orders    MAGNESIUM    Dizziness        Relevant Orders    CT BRAIN WO IV CONTRAST    Referral to  Otolaryngology-[place], [place]    POCT ORTHOSTATIC BLOOD PRESSUE & PULSE    Gait instability        Relevant Orders    CT BRAIN WO IV CONTRAST    Referral to Otolaryngology-[place], [place]    Myalgia        Relevant Orders    CREATINE KINASE (CK), TOTAL, SERUM         Labs obtained in office  U/a neg for wbc- sent for microscopic  DC enalipril  Continue slow position changes  Continue hydration  Ct head ordered at [facility]  Referred to ENT  Will see Dr Aretha Parrot in f/u Monday  Increase Crestor 15 mg q hs  Norco 7.5mg  #90 no refills given  Refuses colonoscopy  Any acute issues seek ER      Follow up: Return in about 1 month (around 04/14/2022).  Seek medical attention for new or worsening symptoms.  Patient has been seen in this clinic within the last 3 years.     Paulla Mcclaskey, PA-C          This note was partially created using MModal Fluency Direct system (voice recognition software) and is inherently subject to errors including those of syntax and "sound-alike" substitutions which may escape proofreading.  In such instances, original meaning may be extrapolated by  contextual derivation.

## 2022-03-14 NOTE — Nursing Note (Signed)
Patient is here for 1 month follow up for medication refills. Patient also has complaints of dizziness for the past month.

## 2022-03-15 LAB — URINE CULTURE,ROUTINE: URINE CULTURE: 20000 — AB

## 2022-03-17 ENCOUNTER — Other Ambulatory Visit (RURAL_HEALTH_CENTER): Payer: Self-pay | Admitting: Physician Assistant

## 2022-03-17 MED ORDER — LEVETIRACETAM ER 500 MG TABLET,EXTENDED RELEASE 24 HR
1.0000 | ORAL_TABLET | Freq: Every evening | ORAL | 1 refills | Status: DC
Start: 2022-03-17 — End: 2023-12-30

## 2022-03-18 ENCOUNTER — Telehealth (INDEPENDENT_AMBULATORY_CARE_PROVIDER_SITE_OTHER): Payer: Self-pay | Admitting: NURSE PRACTITIONER

## 2022-03-18 ENCOUNTER — Other Ambulatory Visit (RURAL_HEALTH_CENTER): Payer: Self-pay | Admitting: Physician Assistant

## 2022-03-18 NOTE — Telephone Encounter (Signed)
APPT W/ MELISSA SHREWSBURY 05/08/2022 @ 9:00 AM

## 2022-03-19 ENCOUNTER — Other Ambulatory Visit (RURAL_HEALTH_CENTER): Payer: Self-pay | Admitting: Physician Assistant

## 2022-03-19 MED ORDER — NITROFURANTOIN MONOHYDRATE/MACROCRYSTALS 100 MG CAPSULE
100.0000 mg | ORAL_CAPSULE | Freq: Two times a day (BID) | ORAL | 0 refills | Status: DC
Start: 2022-03-19 — End: 2022-03-24

## 2022-03-24 ENCOUNTER — Other Ambulatory Visit (RURAL_HEALTH_CENTER): Payer: Self-pay | Admitting: Physician Assistant

## 2022-03-24 ENCOUNTER — Telehealth (RURAL_HEALTH_CENTER): Payer: Self-pay | Admitting: Physician Assistant

## 2022-03-24 ENCOUNTER — Encounter (INDEPENDENT_AMBULATORY_CARE_PROVIDER_SITE_OTHER): Payer: Self-pay

## 2022-03-24 DIAGNOSIS — N390 Urinary tract infection, site not specified: Secondary | ICD-10-CM

## 2022-03-24 IMAGING — CT CT BRAIN W/O CONTRAST
2 series · 16 of 30 positions shown, 20 images · non-contrast
Comparison: None available.

﻿EXAM:  CT BRAIN W/O CONTRAST
INDICATION: Dizziness and unsteady gait.
TECHNIQUE: Axial noncontrast CT imaging was performed from skull base to the vertex. Exam was performed using 1 or more of the following dose reduction techniques: Automated exposure control, adjustment of the mA and/or kV according to patient size, or the use of iterative reconstruction technique.

[Series 2: axial without · axial · non-contrast · 0.49mm/px · z∈[+1418,+1562]mm · 13 of 112 slices shown, 17 images]
[im 8/112  brain]
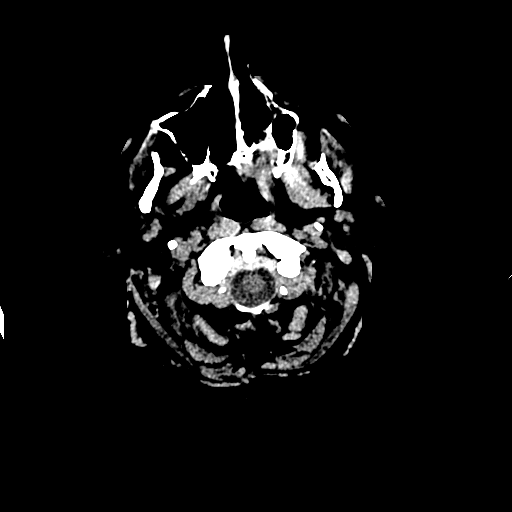
[im 8/112  bone]
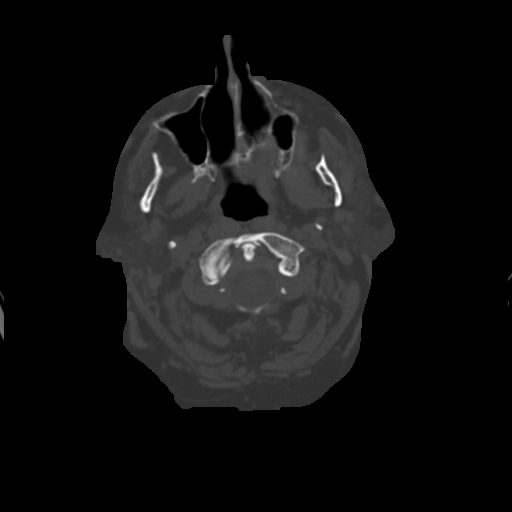
[im 16/112  brain]
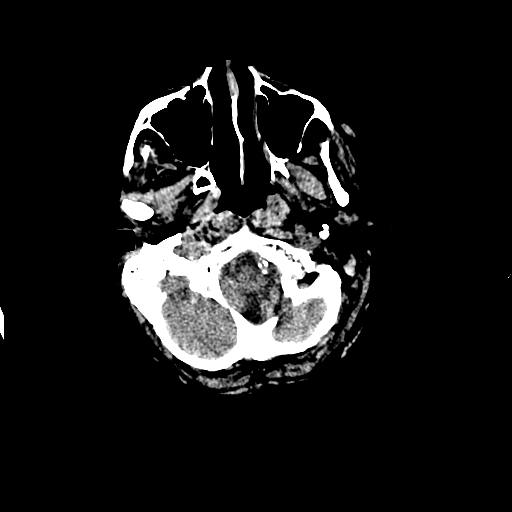
[im 24/112  brain]
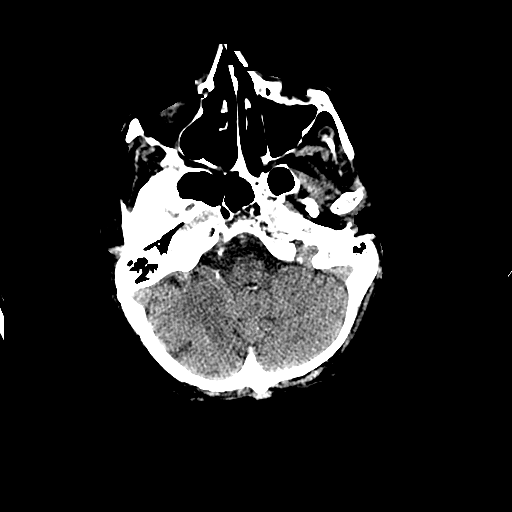
[im 32/112  brain]
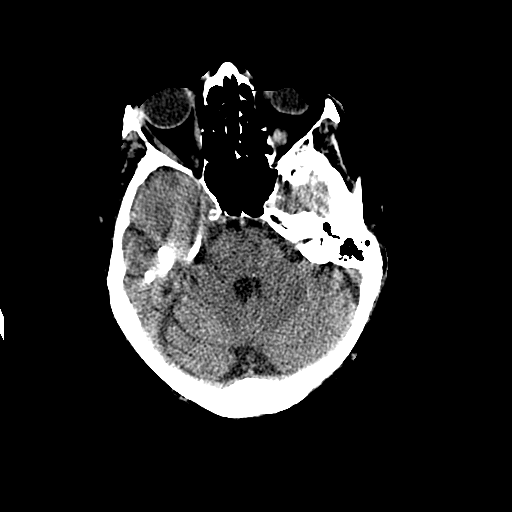
[im 40/112  brain]
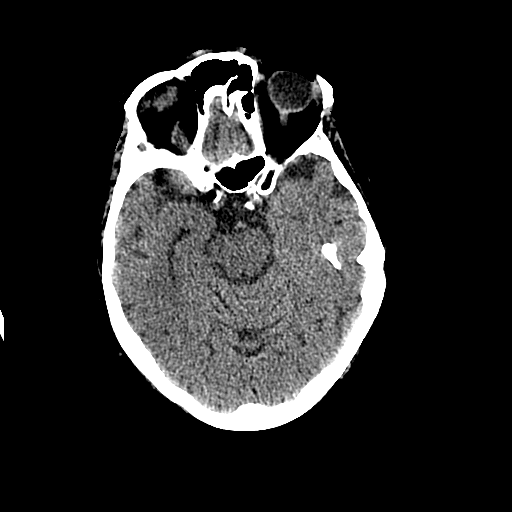
[im 40/112  bone]
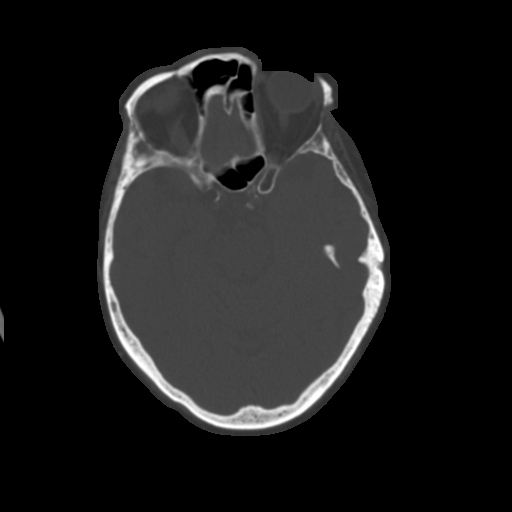
[im 48/112  brain]
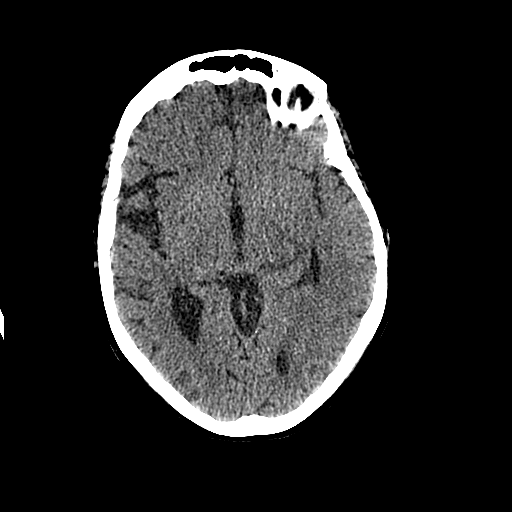
[im 56/112  brain]
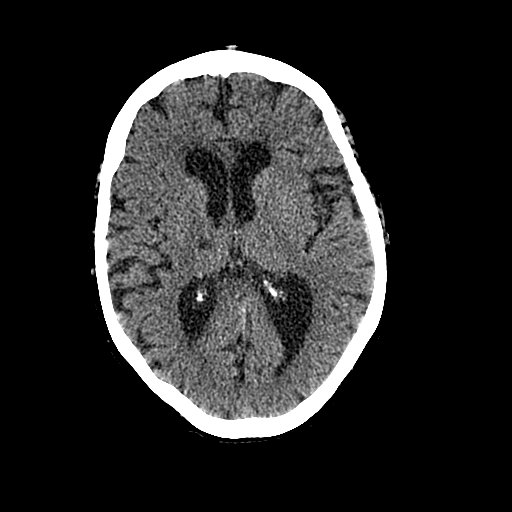
[im 64/112  brain]
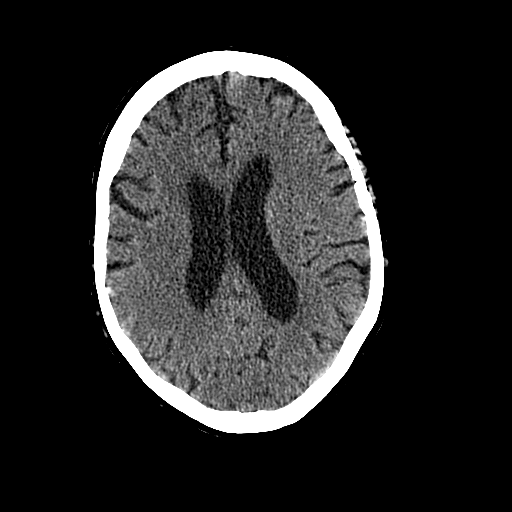
[im 72/112  brain]
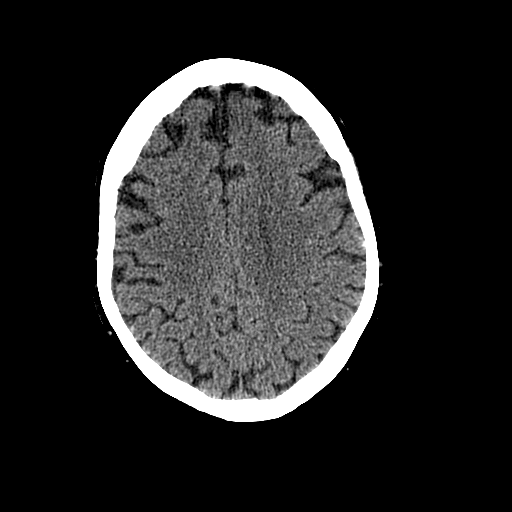
[im 72/112  bone]
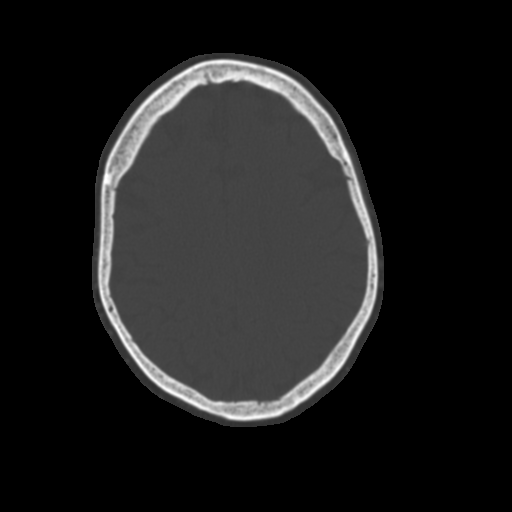
[im 80/112  brain]
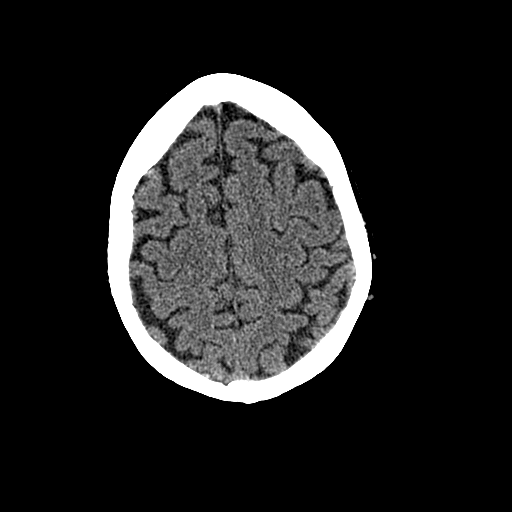
[im 88/112  brain]
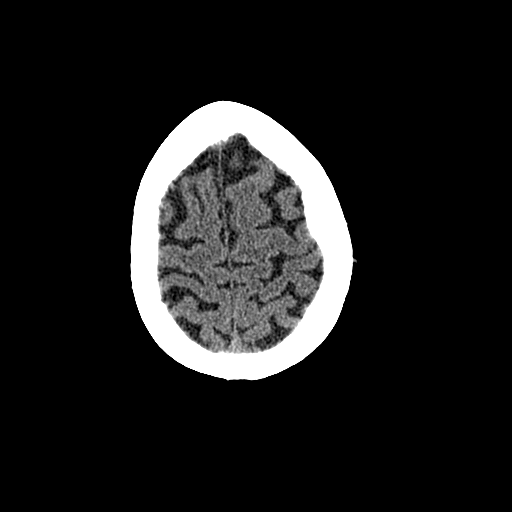
[im 96/112  brain]
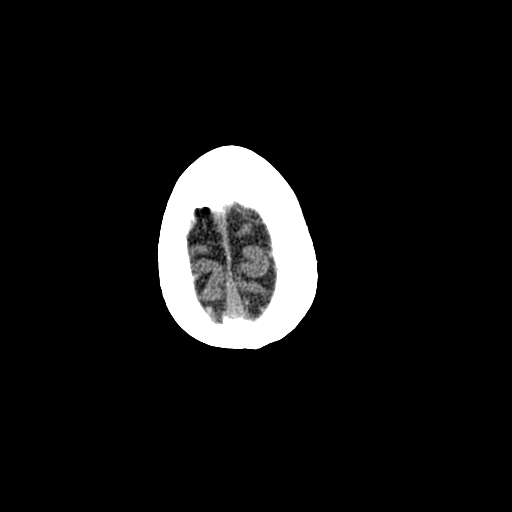
[im 104/112  brain]
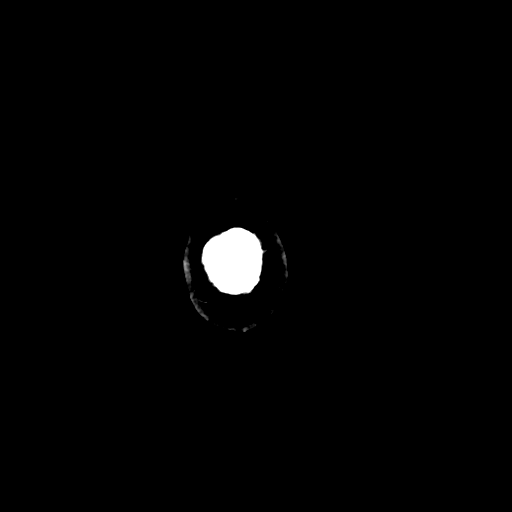
[im 104/112  bone]
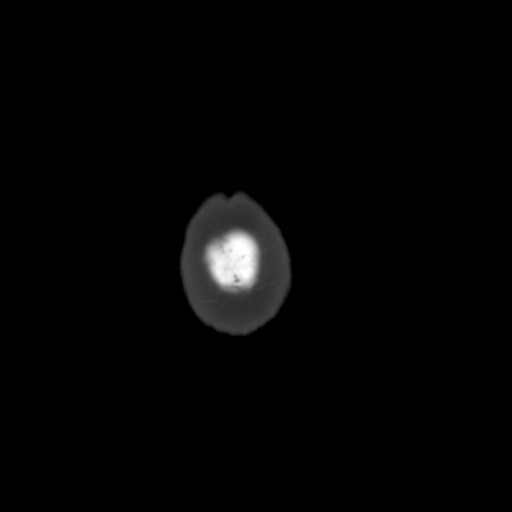

[Series 3: ax bone (hospital) · axial · 0.49mm/px · z∈[+1418,+1466]mm · 3 of 112 slices shown]
[im 8/112  bone]
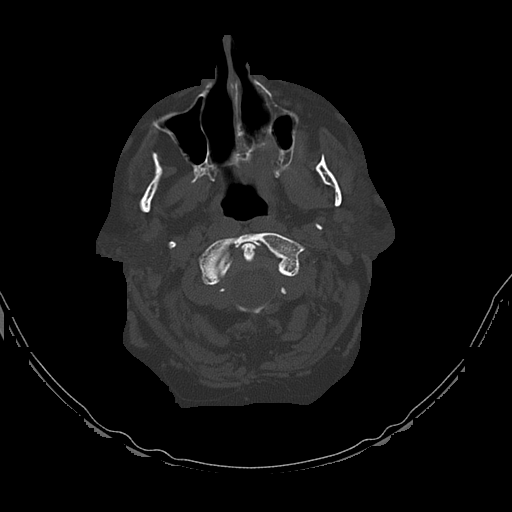
[im 24/112  bone]
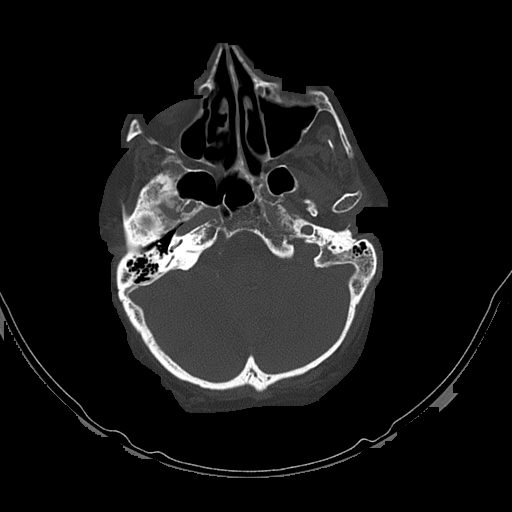
[im 40/112  bone]
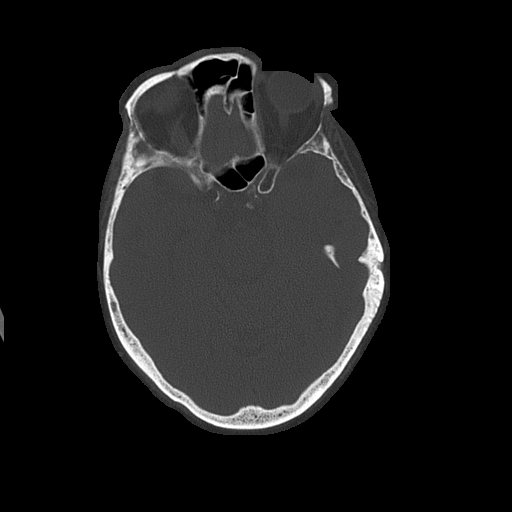

[16 of 30 positions shown; findings below may reference images not displayed]

FINDINGS: The cortical sulci and ventricular system is appropriate in size for the patient's age.  There are mild chronic small vessel ischemic changes. Right thalamic lacunar infarct is also noted. There is no mass effect, midline shift or intracranial hemorrhage. There is no evidence of acute infarction. There are no extra-axial fluid collections. Visualized paranasal sinuses, mastoid air cells and orbital contents are also unremarkable.
IMPRESSION: No acute intracranial abnormality.

## 2022-03-24 MED ORDER — SULFAMETHOXAZOLE 800 MG-TRIMETHOPRIM 160 MG TABLET
1.0000 | ORAL_TABLET | Freq: Two times a day (BID) | ORAL | 0 refills | Status: AC
Start: 2022-03-24 — End: 2022-04-03

## 2022-03-27 ENCOUNTER — Other Ambulatory Visit (RURAL_HEALTH_CENTER): Payer: Self-pay | Admitting: Physician Assistant

## 2022-04-02 ENCOUNTER — Encounter (INDEPENDENT_AMBULATORY_CARE_PROVIDER_SITE_OTHER): Payer: Self-pay | Admitting: Student in an Organized Health Care Education/Training Program

## 2022-04-03 DIAGNOSIS — G8929 Other chronic pain: Secondary | ICD-10-CM | POA: Insufficient documentation

## 2022-04-03 DIAGNOSIS — M791 Myalgia, unspecified site: Secondary | ICD-10-CM | POA: Insufficient documentation

## 2022-04-03 DIAGNOSIS — Z79899 Other long term (current) drug therapy: Secondary | ICD-10-CM | POA: Insufficient documentation

## 2022-04-03 DIAGNOSIS — E1165 Type 2 diabetes mellitus with hyperglycemia: Secondary | ICD-10-CM | POA: Insufficient documentation

## 2022-04-03 HISTORY — DX: Other chronic pain: G89.29

## 2022-04-04 ENCOUNTER — Ambulatory Visit (INDEPENDENT_AMBULATORY_CARE_PROVIDER_SITE_OTHER): Payer: Medicare Other | Admitting: Student in an Organized Health Care Education/Training Program

## 2022-04-04 ENCOUNTER — Other Ambulatory Visit: Payer: Self-pay

## 2022-04-04 VITALS — BP 153/73 | HR 76 | Ht 66.0 in | Wt 201.0 lb

## 2022-04-04 DIAGNOSIS — R300 Dysuria: Secondary | ICD-10-CM

## 2022-04-04 NOTE — Progress Notes (Signed)
[facility]  UROLOGY, [facility]    Progress Note    Name: Grace Hoffman MRN:  [id number]   Date: 04/04/2022 Age: 71 y.o.       Chief Complaint: Urinary Tract Infection (Referral for UTI from Amy Alvis)    Subjective:   Ms. Kobel is a pleasant 71yoF with history of diabetes, fibromyalgia, chronic back pain. She presents for evaluation of recurrent UTI. She states that she does not have urinary complaints but when her urine is tested she is told she has an infection. Her most recent culture demonstrated mixed flora. She denies hematuria, dysuria, urinary urgency/frequency, incontinence, suprapubic pain, fevers, chills, nausea, vomiting. Her urinalysis today demonstrates glucosuria. today's UA as well as her past UA demonstrated specific gravity >1.030. CT scan in 2015 demonstrated simple right renal cysts without nephrolithisis. She denies history of kidney stones.      Objective :  BP (!) 153/73 (Site: Right, Patient Position: Sitting)   Pulse 76   Ht 1.676 m (5\' 6" )   Wt 91.2 kg (201 lb)   BMI 32.44 kg/m       Gen: NAD, alert  Pulm: unlabored at rest  CV: palpable pulses  Abd: soft, Nt/ND  GU: no suprapubic tenderness, no CVAT    Data reviewed:    Current Outpatient Medications   Medication Sig   . d-mannose 500 mg Oral Capsule Take 1 Capsule (500 mg total) by mouth Twice daily   . diazePAM (VALIUM) 5 mg Oral Tablet Take 1 Tablet (5 mg total) by mouth Twice per day as needed   . enalapril (VASOTEC) 5 mg Oral Tablet Take 1 Tablet (5 mg total) by mouth Once a day   . ergocalciferol, vitamin D2, (DRISDOL) 1,250 mcg (50,000 unit) Oral Capsule Take 1 Capsule (50,000 Units total) by mouth Every 7 days   . famotidine (PEPCID) 20 mg Oral Tablet TAKE 1 TABLET BY MOUTH TWICE DAILY   . glimepiride (AMARYL) 4 mg Oral Tablet TAKE 1 TABLET(4 MG) BY MOUTH TWICE DAILY WITH A MEAL FOR DIABETES   . HYDROcodone-acetaminophen (NORCO) 7.5-325 mg Oral Tablet Take 1 Tablet by mouth Every 6 hours as needed for up to  30 days Indications: pain   . latanoprost (XALATAN) 0.005 % Ophthalmic Drops Administer 1 Drop into the left eye Every night   . Levetiracetam 500 mg Oral Tablet Sustained Release 24 hr Take 1 Tablet (500 mg total) by mouth Every night for 90 days   . magnesium oxide (MAG-OX) 400 mg Oral Tablet Take 2 Tablets (800 mg total) by mouth Once a day   . metFORMIN (GLUCOPHAGE) 500 mg Oral Tablet Take 2 Tablets (1,000 mg total) by mouth Every morning with breakfast   . rosuvastatin (CRESTOR) 10 mg Oral Tablet Take 1 Tablet (10 mg total) by mouth Once a day   . SUMAtriptan (IMITREX) 50 mg Oral Tablet sumatriptan 50 mg tablet   TAKE 1 TABLET BY MOUTH AT ONSET OF HEADACHE. IF NO RELIEF MAY REPEAT 1 AFTER AT LEAST 2 HOURS. MAX OF 4 TABS PER 24 HOURS   . venlafaxine (EFFEXOR XR) 37.5 mg Oral Capsule, Sust. Release 24 hr Take 1 Capsule (37.5 mg total) by mouth Every night     Assessment/Plan  Problem List Items Addressed This Visit    None  Visit Diagnoses     Dysuria    -  Primary    Relevant Orders    POCT Urine Dipstick  Asymptomatic bacteruria:  Informed patient that without urinary symptoms I would not treat nor even test her urine as treatment of asymptomatic bacteruria is associated with increased morbidity and mortality. Reviewed patient's urinalysis as it realtes to glucosuria and elevated SG and encouraged patient to maintain better glucose control and improve hydration.   Continue D-mannose    Arlana Hove, DO

## 2022-04-07 ENCOUNTER — Other Ambulatory Visit: Payer: Self-pay

## 2022-04-07 ENCOUNTER — Ambulatory Visit (RURAL_HEALTH_CENTER): Payer: Medicare Other | Attending: Physician Assistant | Admitting: Physician Assistant

## 2022-04-07 ENCOUNTER — Encounter (RURAL_HEALTH_CENTER): Payer: Self-pay | Admitting: Physician Assistant

## 2022-04-07 DIAGNOSIS — Z79899 Other long term (current) drug therapy: Secondary | ICD-10-CM | POA: Insufficient documentation

## 2022-04-07 DIAGNOSIS — I10 Essential (primary) hypertension: Secondary | ICD-10-CM | POA: Insufficient documentation

## 2022-04-07 DIAGNOSIS — E782 Mixed hyperlipidemia: Secondary | ICD-10-CM | POA: Insufficient documentation

## 2022-04-07 DIAGNOSIS — E119 Type 2 diabetes mellitus without complications: Secondary | ICD-10-CM | POA: Insufficient documentation

## 2022-04-07 DIAGNOSIS — Z7984 Long term (current) use of oral hypoglycemic drugs: Secondary | ICD-10-CM | POA: Insufficient documentation

## 2022-04-07 DIAGNOSIS — Z0000 Encounter for general adult medical examination without abnormal findings: Secondary | ICD-10-CM

## 2022-04-07 NOTE — Progress Notes (Signed)
[facility]  [place], [place]  Operated by [facility]  Progress Note    Name: Grace Hoffman MRN:  [id number]   Date: 04/07/2022 Age: 71 y.o.         Encounter Date: 04/07/2022   PCP: Cleophus Molt, PA-C      SUBJECTIVE:   Grace Hoffman is a 71 y.o. female for presenting for annual Medicare Wellness Visit.    TELEMEDICINE DOCUMENTATION:    Patient Location: [place]/home  Patient/family aware of provider location:  yes  Patient/family consent for telemedicine:  yes  Examination observed and performed by: Hennie Duos LPN/ [place] 04/07/2022   Do you wish to complete this form? Yes   During the past 4 weeks, how would you rate your health in general? Fair   During the past 4 weeks, how much difficulty have you had doing your usual activities inside and outside your home because of medical or emotional problems? A little bit of difficulty   During the past 4 weeks, was someone available to help you if you needed and wanted help? Yes, as much as I wanted   In the past year, how many times have you gone to the emergency department or been admitted to a hospital for a health problem? None   Are you generally satisfied with your sleep? Yes   Do you have enough money to buy things you need in everyday life, such as food, clothing, medicines, and housing? Yes, always   Can you get to places beyond walking distance without help?  (For example, can you drive your own car or travel alone on buses)? Yes   Do you fasten your seatbelt when you are in a car? Yes, usually   Do you exercise 20 minutes 3 or more days per week (such as walking, dancing, biking, mowing grass, swimming)? Yes, most of the time   How often do you eat food that is healthy (fruits, vegetables, lean meats) instead of unhealthy (sweets, fast food, junk food, fatty foods)? Most of the time   How often do you have trouble taking medicines  the eay you are told to take them? I always take them as prescribed   Do you need any help communicating with your doctors and nurses because of vision or hearing problems? No   During the past 12 months, have you experienced confusion or memory loss that is happening more often or is getting worse? No   If you are seeing a Primary Care Provider (PCP) or family doctor. please list their name Lylian Sanagustin Pa-C   Are you now also seeing any specialist physician(s) (such as eye doctor, foot doctor, skin doctor)? No   How confident are you that you can control or manage most of your health problems? Very confident         I have reviewed and updated as appropriate the past medical, family and social history. 04/07/2022 as summarized below:  Past Medical History:   Diagnosis Date   . Anxiety    . Chronic back pain    . COVID-19 vaccine series completed    . Depression    . Diabetes mellitus, type 2 (CMS HCC)    . Fibromyalgia affecting multiple sites    . Hypertension    . Hypomagnesemia    . Insomnia disorder    . Mixed hyperlipidemia    . Orthostatic  hypotension    . Osteoarthritis    . Unspecified glaucoma(365.9)    . Vitamin D deficiency      Past Surgical History:   Procedure Laterality Date   . Hx cholecystectomy     . Hx knee replacment Left      Current Outpatient Medications   Medication Sig   . d-mannose 500 mg Oral Capsule Take 1 Capsule (500 mg total) by mouth Twice daily   . diazePAM (VALIUM) 5 mg Oral Tablet Take 1 Tablet (5 mg total) by mouth Twice per day as needed   . enalapril (VASOTEC) 5 mg Oral Tablet Take 1 Tablet (5 mg total) by mouth Once a day   . ergocalciferol, vitamin D2, (DRISDOL) 1,250 mcg (50,000 unit) Oral Capsule Take 1 Capsule (50,000 Units total) by mouth Every 7 days   . famotidine (PEPCID) 20 mg Oral Tablet TAKE 1 TABLET BY MOUTH TWICE DAILY   . glimepiride (AMARYL) 4 mg Oral Tablet TAKE 1 TABLET(4 MG) BY MOUTH TWICE DAILY WITH A MEAL FOR DIABETES   . HYDROcodone-acetaminophen (NORCO)  7.5-325 mg Oral Tablet Take 1 Tablet by mouth Every 6 hours as needed for up to 30 days Indications: pain   . latanoprost (XALATAN) 0.005 % Ophthalmic Drops Administer 1 Drop into the left eye Every night   . Levetiracetam 500 mg Oral Tablet Sustained Release 24 hr Take 1 Tablet (500 mg total) by mouth Every night for 90 days   . magnesium oxide (MAG-OX) 400 mg Oral Tablet Take 2 Tablets (800 mg total) by mouth Once a day   . metFORMIN (GLUCOPHAGE) 500 mg Oral Tablet Take 2 Tablets (1,000 mg total) by mouth Every morning with breakfast   . rosuvastatin (CRESTOR) 10 mg Oral Tablet Take 1 Tablet (10 mg total) by mouth Once a day   . SUMAtriptan (IMITREX) 50 mg Oral Tablet sumatriptan 50 mg tablet   TAKE 1 TABLET BY MOUTH AT ONSET OF HEADACHE. IF NO RELIEF MAY REPEAT 1 AFTER AT LEAST 2 HOURS. MAX OF 4 TABS PER 24 HOURS   . venlafaxine (EFFEXOR XR) 37.5 mg Oral Capsule, Sust. Release 24 hr Take 1 Capsule (37.5 mg total) by mouth Every night     Family Medical History:     Family history is unknown by patient.          Social History     Socioeconomic History   . Marital status: Married   Tobacco Use   . Smoking status: Never   . Smokeless tobacco: Never   Substance and Sexual Activity   . Alcohol use: Never   . Drug use: Never   . Sexual activity: Yes     Partners: Male        List of [place] Team     PCP     Name Type Specialty Phone Number    Cleophus Molt, [place] Physician Assistant [place] [phone]          Care Team     No care team found                  Health Maintenance   Topic Date Due   . Diabetic Retinal Exam  Never done   . Osteoporosis screening  Never done   . Hepatitis C screening  Never done   . Diabetes A1C  Never done   . Diabetic Kidney Health Microalb/Cr Ratio  Never done   . Colonoscopy  Never  done   . Mammography  Never done   . Pneumococcal Vaccination, Age 42+ (2 - PPSV23 if available, else PCV20) 05/04/2017   . Covid-19 Vaccine (4 - Booster for Pfizer  series) 04/18/2021   . Annual Wellness Visit  Never done   . Diabetic Kidney Health eGFR  03/15/2023   . Adult Tdap-Td (2 - Td or Tdap) 08/21/2029   . Influenza Vaccine  Completed   . Shingles Vaccine  Completed   . Meningococcal Vaccine  Aged Out   . Depression Screening  Discontinued     Medicare Wellness Assessment   Medicare initial or wellness physical in the last year?: Yes  Advance Directives (optional)   Does patient have a living will or MPOA: no   Has patient provided [employer] with a copy?: no   Advance directive information given to the patient today?: no      Activities of Daily Living   Do you need help with dressing, bathing, or walking?: No   Do you need help with shopping, housekeeping, medications, or finances?: No   Do you have rugs in hallways, broken steps, or poor lighting?: No   Do you have grab bars in your bathroom, non-slip strips in your tub, and hand rails on your stairs?: Yes   Urinary Incontinence Screen (Women >=65 only)   Do you ever leak urine when you don't want to?: YES   Cognitive Function Screen (1=Yes, 0=No)   What is you age?: Correct   What is the time to the nearest hour?: Correct   What is the year?: Correct   What is the name of this clinic?: Correct   Can the patient recognize two persons (the doctor, the nurse, home help, etc.)?: Correct   What is the date of your birth? (day and month sufficient) : Correct   In what year did World War II end?: Correct   Who is the current president of the [place]?: Correct   Count from 20 down to 1?: Correct   What address did I give you earlier?: Correct   Total Score: 10   Interpretation of Total Score: Greater than 6 Normal   Hearing Screen   Have you noticed any hearing difficulties?: No  After whispering 9-1-6 how many numbers did the patient repeat correctly?: 2  After whispering 4-7-8 how many numbers did the patient repeat correctly?: 2   Fall Risk Screen   Do you feel unsteady when standing or walking?: Yes  Do you  worry about falling?: Yes  Have you fallen in the past year?: No   Vision Screen           Depression Screen   Little interest or pleasure in doing things.: Not at all  Feeling down, depressed, or hopeless: Not at all  PHQ 2 Total: 0   Pain Score   Pain Score:   6    Substance Use-Abuse Screening   Tobacco Use   In Past 12 MONTHS, how often have you used any tobacco product (for example, cigarettes, e-cigarettes, cigars, pipes, or smokeless tobacco)?: Never   Alcohol use   In the PAST 12 MONTHS, how often have you had 5 (men)/4 (women) or more drinks containing alcohol in one day?: Never   Prescription Drug Use   In the PAST 12 months, how often have you used any prescription medications just for the feeling, more than prescribed, or that were not prescribed for you? Prescriptions may include: opioids, benzodiazepines, medications for ADHD:  Never         Illicit Drug Use   In the PAST 12 MONTHS, how often have you used any drugs, including marijuana, cocaine or crack, heroin, methamphetamine, hallucinogens, ecstasy/MDMA?: Never            OBJECTIVE:   There were no vitals filed for this visit.          ASSESSMENT/PLAN:      Identified Risk Factors/ Recommended Actions   No diagnosis found.        Fall Risk Follow up plan of care: recommend cane/walker when out  The PHQ 2 Total: 0 depression screen is interpreted as negative.  Urinary Incontinence Plan of Care: Behavioral interventions with bladder training, pelvic floor muscle training, and/or prompted voiding  Opioid use plan of care:         Opioids use: Plan: Assessment of pain completed and pain controlled          Written Prevention Plan reviewed and shared in writing with the patient (See Patient Instructions in After Visit Summary).       The patient has been educated about risk factors and recommended preventive care. Written Prevention Plan completed/ updated and given to patient (see After Visit Summary).    Total time spent 28mns    Follow up: as  scheduled    [place] PA-C    [place]     This note was partially created using MModal Fluency Direct system (voice recognition software) and is inherently subject to errors including those of syntax and "sound-alike" substitutions which may escape proofreading.  In such instances, original meaning may be extrapolated by contextual derivation.

## 2022-04-08 ENCOUNTER — Encounter (INDEPENDENT_AMBULATORY_CARE_PROVIDER_SITE_OTHER): Payer: Self-pay | Admitting: NURSE PRACTITIONER

## 2022-04-08 ENCOUNTER — Ambulatory Visit (INDEPENDENT_AMBULATORY_CARE_PROVIDER_SITE_OTHER): Payer: Medicare Other | Admitting: NURSE PRACTITIONER

## 2022-04-08 VITALS — BP 118/78 | Ht 66.0 in | Wt 200.0 lb

## 2022-04-08 DIAGNOSIS — H9193 Unspecified hearing loss, bilateral: Secondary | ICD-10-CM

## 2022-04-08 DIAGNOSIS — R2681 Unsteadiness on feet: Secondary | ICD-10-CM

## 2022-04-08 DIAGNOSIS — R42 Dizziness and giddiness: Secondary | ICD-10-CM

## 2022-04-08 NOTE — H&P (Signed)
[facility], [place]  [place] [address]  Phone: [phone]  Fax: [phone]      Encounter Date: 04/08/2022    Patient ID: [name]  MRN: [id number]    DOB: Nov 03, 1951  Age: 71 y.o. female         Referring Provider:    Cleophus Molt, PA-C  [address],  [place] [address]    Reason for Visit:   Chief Complaint   Patient presents with   . Dizziness     Pt complains of room spinning dizziness x 4 mo. Pt states dizziness is worse when she lays down        History of Present Illness:  Grace Hoffman is a 71 y.o. female referred for dizziness.  She complains of 3 month history of vertigo worse when she lays down or with rapid head movement.  She feels that hearing has decreased since vertigo began.  No tinnitus.  CT and MRI brain at [facility].  She is seeing Dr. Bosie Helper for migraines.      Patient History:  Patient Active Problem List   Diagnosis   . Orthostatic hypotension   . Diabetes mellitus, type 2 (CMS HCC)   . Hypertension   . Osteoarthritis   . Anxiety   . Depression   . Uncontrolled type 2 diabetes mellitus with hyperglycemia (CMS [place])   . Hypomagnesemia   . Myalgia   . Long-term use of high-risk medication   . Chronic pain     Current Outpatient Medications   Medication Sig   . d-mannose 500 mg Oral Capsule Take 1 Capsule (500 mg total) by mouth Twice daily   . diazePAM (VALIUM) 5 mg Oral Tablet Take 1 Tablet (5 mg total) by mouth Twice per day as needed   . enalapril (VASOTEC) 5 mg Oral Tablet Take 1 Tablet (5 mg total) by mouth Once a day   . ergocalciferol, vitamin D2, (DRISDOL) 1,250 mcg (50,000 unit) Oral Capsule Take 1 Capsule (50,000 Units total) by mouth Every 7 days   . famotidine (PEPCID) 20 mg Oral Tablet TAKE 1 TABLET BY MOUTH TWICE DAILY   . glimepiride (AMARYL) 4 mg Oral Tablet TAKE 1 TABLET(4 MG) BY MOUTH TWICE DAILY WITH A MEAL FOR DIABETES   . HYDROcodone-acetaminophen (NORCO) 7.5-325 mg Oral Tablet Take 1 Tablet by mouth Every 6 hours as  needed for up to 30 days Indications: pain   . latanoprost (XALATAN) 0.005 % Ophthalmic Drops Administer 1 Drop into the left eye Every night   . Levetiracetam 500 mg Oral Tablet Sustained Release 24 hr Take 1 Tablet (500 mg total) by mouth Every night for 90 days   . magnesium oxide (MAG-OX) 400 mg Oral Tablet Take 2 Tablets (800 mg total) by mouth Once a day   . metFORMIN (GLUCOPHAGE) 500 mg Oral Tablet Take 2 Tablets (1,000 mg total) by mouth Every morning with breakfast   . rosuvastatin (CRESTOR) 10 mg Oral Tablet Take 1 Tablet (10 mg total) by mouth Once a day   . SUMAtriptan (IMITREX) 50 mg Oral Tablet sumatriptan 50 mg tablet   TAKE 1 TABLET BY MOUTH AT ONSET OF HEADACHE. IF NO RELIEF MAY REPEAT 1 AFTER AT LEAST 2 HOURS. MAX OF 4 TABS PER 24 HOURS   . venlafaxine (EFFEXOR XR) 37.5 mg Oral Capsule, Sust. Release 24 hr Take 1 Capsule (37.5 mg total) by mouth Every night     Allergies   Allergen  Reactions   . Codeine Nausea/ Vomiting   . Amoxicillin Diarrhea   . Macrobid [Nitrofurantoin Monohyd/M-Cryst] Nausea/ Vomiting     Past Medical History:   Diagnosis Date   . Anxiety    . Chronic back pain    . COVID-19 vaccine series completed    . Depression    . Diabetes mellitus, type 2 (CMS HCC)    . Fibromyalgia affecting multiple sites    . Hypertension    . Hypomagnesemia    . Insomnia disorder    . Migraine    . Mixed hyperlipidemia    . Orthostatic hypotension    . Osteoarthritis    . Unspecified glaucoma(365.9)    . Vitamin D deficiency       Past Surgical History:   Procedure Laterality Date   . HX CHOLECYSTECTOMY     . HX KNEE REPLACMENT Left       Family Medical History:     Family history is unknown by patient.          Social History     Tobacco Use   . Smoking status: Never   . Smokeless tobacco: Never   Substance Use Topics   . Alcohol use: Never   . Drug use: Never       Review of Systems   Constitutional: Negative.    HENT: Positive for hearing loss.    Respiratory: Negative.    Musculoskeletal:  Positive for gait problem.   Allergic/Immunologic: Negative.    Neurological: Positive for dizziness.   Hematological: Negative.    Psychiatric/Behavioral: Negative.         Physical Exam:  BP 118/78   Ht 1.676 m (5\' 6" )   Wt 90.7 kg (200 lb)   BMI 32.28 kg/m       GENERAL APPEARANCE  Obese  MOOD AND AFFECT  Normal  COMMUNICATION  Normal  NEURO EXAM  Cranial II-XII intact, grossly intact and alert and oriented x3  HEAD & NECK  Lesions:  No  Scars:  No  Normal parotid gland:  Yes  Normal submandibular gland:  Yes  TMJ:  Normal  Neck mass:  No  Neck tenderness:  No  Lymphadenopathy:  No  Thyroid normal:  Yes  NOSE  Normal to inspection  ORAL CAVITY  Normal gums, lips, teeth:  Yes  Oropharynx normal:  Yes  RIGHT EAR  Normal to inspection:  Yes  Tympanic membrane:  Intact  External ears normal:  Yes  Internal canal:  Normal  LEFT EAR  Normal to inspection:  Yes  Tympanic membrane:  Intact  External ears normal:  Yes  Internal canal:  Normal    Assessment:  ENCOUNTER DIAGNOSES     ICD-10-CM   1. Dizziness  R42   2. Gait instability  R26.81   3. Bilateral hearing loss, unspecified hearing loss type  H91.93       Plan:  Medical records reviewed on 04/08/2022.  Reviewed [facility] head [facility] 03/24/22 and this is normal.  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 AMB PRN REFERRAL EXTERNAL AUDIOLOGIST     Return for after vestibular testing.    Emiliano Dyer, FNP-BC  04/08/2022, 13:43

## 2022-04-10 ENCOUNTER — Ambulatory Visit (RURAL_HEALTH_CENTER): Payer: Self-pay | Admitting: Physician Assistant

## 2022-04-17 ENCOUNTER — Encounter (RURAL_HEALTH_CENTER): Payer: Self-pay | Admitting: Physician Assistant

## 2022-04-17 ENCOUNTER — Ambulatory Visit: Payer: Medicare Other | Attending: Physician Assistant | Admitting: Physician Assistant

## 2022-04-17 ENCOUNTER — Other Ambulatory Visit: Payer: Self-pay

## 2022-04-17 ENCOUNTER — Ambulatory Visit (RURAL_HEALTH_CENTER): Payer: Medicare Other | Attending: Physician Assistant | Admitting: Physician Assistant

## 2022-04-17 VITALS — BP 130/70 | HR 73 | Temp 98.0°F | Ht 66.0 in | Wt 203.0 lb

## 2022-04-17 DIAGNOSIS — G894 Chronic pain syndrome: Secondary | ICD-10-CM

## 2022-04-17 DIAGNOSIS — R42 Dizziness and giddiness: Secondary | ICD-10-CM | POA: Insufficient documentation

## 2022-04-17 DIAGNOSIS — Z7984 Long term (current) use of oral hypoglycemic drugs: Secondary | ICD-10-CM | POA: Insufficient documentation

## 2022-04-17 DIAGNOSIS — G8929 Other chronic pain: Secondary | ICD-10-CM | POA: Insufficient documentation

## 2022-04-17 DIAGNOSIS — E785 Hyperlipidemia, unspecified: Secondary | ICD-10-CM | POA: Insufficient documentation

## 2022-04-17 DIAGNOSIS — Z1159 Encounter for screening for other viral diseases: Secondary | ICD-10-CM

## 2022-04-17 DIAGNOSIS — Z79899 Other long term (current) drug therapy: Secondary | ICD-10-CM | POA: Insufficient documentation

## 2022-04-17 DIAGNOSIS — E538 Deficiency of other specified B group vitamins: Secondary | ICD-10-CM | POA: Insufficient documentation

## 2022-04-17 DIAGNOSIS — E1165 Type 2 diabetes mellitus with hyperglycemia: Secondary | ICD-10-CM | POA: Insufficient documentation

## 2022-04-17 DIAGNOSIS — F419 Anxiety disorder, unspecified: Secondary | ICD-10-CM | POA: Insufficient documentation

## 2022-04-17 DIAGNOSIS — M797 Fibromyalgia: Secondary | ICD-10-CM | POA: Insufficient documentation

## 2022-04-17 DIAGNOSIS — I10 Essential (primary) hypertension: Secondary | ICD-10-CM | POA: Insufficient documentation

## 2022-04-17 DIAGNOSIS — F32A Depression, unspecified: Secondary | ICD-10-CM | POA: Insufficient documentation

## 2022-04-17 LAB — URINALYSIS, MACROSCOPIC
BILIRUBIN: NEGATIVE mg/dL
BLOOD: NEGATIVE mg/dL
GLUCOSE: NEGATIVE mg/dL
KETONES: NEGATIVE mg/dL
LEUKOCYTES: 250 WBCs/uL — AB
NITRITE: NEGATIVE
PH: 5 (ref 5.0–9.0)
PROTEIN: 20 mg/dL
SPECIFIC GRAVITY: 1.032 — ABNORMAL HIGH (ref 1.002–1.030)
UROBILINOGEN: 2 mg/dL — AB

## 2022-04-17 LAB — MANUAL DIFFERENTIAL
BASOPHIL %: 1 % (ref 0–3)
BASOPHIL ABSOLUTE: 0.05 10*3/uL (ref 0.00–0.30)
BASOPHILS MANUAL: 1
EOSINOPHIL %: 3 % (ref 0–7)
EOSINOPHIL ABSOLUTE: 0.16 10*3/uL (ref 0.00–0.80)
EOSINOPHILS MANUAL: 3
LYMPHOCYTE %: 48 % — ABNORMAL HIGH (ref 25–45)
LYMPHOCYTE ABSOLUTE: 2.59 10*3/uL (ref 1.10–5.00)
LYMPHOCYTES MANUAL: 48
MONOCYTE %: 2 % (ref 0–12)
MONOCYTE ABSOLUTE: 0.11 10*3/uL (ref 0.00–1.30)
MONOCYTES MANUAL: 2
NEUTROPHIL %: 46 % (ref 40–76)
NEUTROPHIL ABSOLUTE: 2.48 10*3/uL (ref 1.80–8.40)
NEUTROPHILS MANUAL: 46
PLATELET MORPHOLOGY COMMENT: NORMAL
RBC MORPHOLOGY COMMENT: NORMAL
TOTAL CELLS COUNTED [#] IN BLOOD: 100
WBC: 5.4 10*3/uL

## 2022-04-17 LAB — COMPREHENSIVE METABOLIC PNL, FASTING
ALBUMIN/GLOBULIN RATIO: 1.4 (ref 0.8–1.4)
ALBUMIN: 4.1 g/dL (ref 3.5–5.7)
ALKALINE PHOSPHATASE: 65 U/L (ref 34–104)
ALT (SGPT): 11 U/L (ref 7–52)
ANION GAP: 6 mmol/L — ABNORMAL LOW (ref 10–20)
AST (SGOT): 16 U/L (ref 13–39)
BILIRUBIN TOTAL: 0.3 mg/dL (ref 0.3–1.2)
BUN/CREA RATIO: 16 (ref 6–22)
BUN: 16 mg/dL (ref 7–25)
CALCIUM, CORRECTED: 9.4 mg/dL (ref 8.9–10.8)
CALCIUM: 9.5 mg/dL (ref 8.6–10.3)
CHLORIDE: 106 mmol/L (ref 98–107)
CO2 TOTAL: 29 mmol/L (ref 21–31)
CREATININE: 0.97 mg/dL (ref 0.60–1.30)
ESTIMATED GFR: 62 mL/min/{1.73_m2} (ref >59)
GLOBULIN: 3 (ref 2.9–5.4)
GLUCOSE: 156 mg/dL — ABNORMAL HIGH (ref 74–109)
OSMOLALITY, CALCULATED: 286 mOsm/kg (ref 270–290)
POTASSIUM: 4.6 mmol/L (ref 3.5–5.1)
PROTEIN TOTAL: 7.1 g/dL (ref 6.4–8.9)
SODIUM: 141 mmol/L (ref 136–145)

## 2022-04-17 LAB — URINALYSIS, MICROSCOPIC
BACTERIA: NEGATIVE /hpf
HYALINE CASTS: 4 /lpf — ABNORMAL HIGH (ref <0)
SQUAMOUS EPITHELIAL: 8 /hpf (ref <28)
WBCS: 19 /hpf — ABNORMAL HIGH (ref <6)

## 2022-04-17 LAB — CBC WITH DIFF
HCT: 39.2 % (ref 37.0–47.0)
HGB: 13.5 g/dL (ref 12.5–16.0)
MCH: 33.8 pg — ABNORMAL HIGH (ref 27.0–32.0)
MCHC: 34.4 g/dL (ref 32.0–36.0)
MCV: 98.3 fL (ref 78.0–99.0)
MPV: 10.4 fL (ref 7.4–10.4)
PLATELETS: 198 10*3/uL (ref 140–440)
RBC: 3.99 10*6/uL — ABNORMAL LOW (ref 4.20–5.40)
RDW: 12.6 % (ref 11.6–14.8)
WBC: 5.4 10*3/uL (ref 4.0–10.5)
WBCS UNCORRECTED: 5.4 10*3/uL

## 2022-04-17 LAB — MAGNESIUM: MAGNESIUM: 1.9 mg/dL (ref 1.9–2.7)

## 2022-04-17 LAB — LIPID PANEL
CHOL/HDL RATIO: 3.8
CHOLESTEROL: 177 mg/dL (ref <200)
HDL CHOL: 47 mg/dL (ref 23–92)
LDL CALC: 100 mg/dL (ref 0–100)
TRIGLYCERIDES: 149 mg/dL (ref <=150)
VLDL CALC: 30 mg/dL (ref 0–50)

## 2022-04-17 LAB — MICROALBUMIN URINE, RANDOM: MICROALBUMIN RANDOM URINE: 1.6 mg/dL

## 2022-04-17 MED ORDER — CYANOCOBALAMIN (VIT B-12) 1,000 MCG/ML INJECTION SOLUTION
1000.0000 ug | Freq: Once | INTRAMUSCULAR | Status: AC
Start: 2022-04-17 — End: 2022-04-17
  Administered 2022-04-17: 1000 ug via INTRAMUSCULAR

## 2022-04-17 MED ORDER — HYDROCODONE 7.5 MG-ACETAMINOPHEN 325 MG TABLET
1.0000 | ORAL_TABLET | Freq: Four times a day (QID) | ORAL | 0 refills | Status: DC | PRN
Start: 2022-04-17 — End: 2022-05-15

## 2022-04-17 MED ORDER — ROSUVASTATIN 20 MG TABLET
20.0000 mg | ORAL_TABLET | Freq: Every evening | ORAL | 1 refills | Status: DC
Start: 2022-04-17 — End: 2022-06-03

## 2022-04-17 MED ORDER — ERGOCALCIFEROL (VITAMIN D2) 1,250 MCG (50,000 UNIT) CAPSULE
50000.0000 [IU] | ORAL_CAPSULE | ORAL | 5 refills | Status: DC
Start: 2022-04-17 — End: 2023-02-18

## 2022-04-17 NOTE — Progress Notes (Signed)
INTERNAL MEDICINE, [facility]  [address]  [place] [id number]  [facility]  History and Physical     Name: Grace BalloonCarolyn S Macaraeg MRN:  [id number]   Date: 04/17/2022 Age: 71 y.o.               Follow Up        Reason for Visit: Follow Up 3 Months and Medication Refill    History of Present Illness  Grace Hoffman is a 71 y.o. female who is being seen today in the office for f/u concerning BP/HTN w pt  currently only on enalapril 5 mg daily .  Patient had previously been taking hydrochlorothiazide with her enalapril the medication held after blood pressure was low and patient was complaining of dizziness.  She did have follow-up with Dr. Cyril MourningLarson yesterday at which time her Crystal's were adjusted with physician maneuvers and she states the dizziness has been better.  She also sees Dr. Aretha Parrotazzaq due to her history of migraines q 3 months and as mentioned the dizziness to him without any significant findings on his workup.  She does have a follow-up May 10th.    F/U BS w history of uncontrolled diabete.  Last hemoglobin A1c 7.3. Patient currently on metformin at 1000 mg twice daily along with glimepiride 4 mg twice daily. BS this am 110.  Once again noted patient previously  unable to tolerate injectables such as Ozempic and Mounjaro; follow-up labs needed today.    Follow-up chronic pain/DJD/fibromyalgia for which she has been on chronic pain therapy/narcotics for numerous years; pain is stable at this current time on dosing no abuse history is noted; patient also compliant w UDS; patient states that she had a MRI on her back and she states that she took it to Dr. Vear ClockPhillips and Dr. Vear ClockPhillips thinks that she has a bulging disc in her thoracic region; requesting medication refill.    F/u low mag w last level 1.8 and med increased. Pt denies any leg cramps however chronic myalgias noted. F/u labs needed.    Follow-up cholesterol with history of  hyperlipidemia noted.  Patient is currently on Crestor 30 mg nightly increased after LDL level 176.  Patient states she does try to watch her diet but gets very low activity because of chronic pain.  No side effects at this time to Crestor are noted in follow-up labs needed.    Follow-up chronic depression/anxiety with patient states she is doing well;  she continues to take her Valium twice a day as needed for anxiety; currently denies any panic attacks suicidal thoughts ideation    +chronic fatigue w noted B12 def hx . Pt requesting B12 shot today.    History of glaucoma; continues to follow with ophthalmologist    Also continues to follow with orthopedic Dr. [place]ear [place]Phillips in [place] due to noted  left shoulder pain with decreased range of motion and rotator cuff tear      PHQ Questionnaire  Little interest or pleasure in doing things.: Not at all  Feeling down, depressed, or hopeless: Not at all  PHQ 2 Total: 0      Past Medical History:   Diagnosis Date   . Anxiety    . Chronic back pain    . COVID-19 vaccine series completed    . Depression    . Diabetes mellitus, type 2 (CMS HCC)    . Fibromyalgia affecting multiple sites    . Hypertension    .  Hypomagnesemia    . Insomnia disorder    . Migraine    . Mixed hyperlipidemia    . Orthostatic hypotension    . Osteoarthritis    . Unspecified glaucoma(365.9)    . Vitamin D deficiency          Past Surgical History:   Procedure Laterality Date   . HX CHOLECYSTECTOMY     . HX KNEE REPLACMENT Left       Family Medical History:     Family history is unknown by patient.          Social History     Tobacco Use   . Smoking status: Never   . Smokeless tobacco: Never   Substance Use Topics   . Alcohol use: Never   . Drug use: Never     Medication:  d-mannose 500 mg Oral Capsule, Take 1 Capsule (500 mg total) by mouth Twice daily  enalapril (VASOTEC) 5 mg Oral Tablet, Take 1 Tablet (5 mg total) by mouth Once a day  famotidine (PEPCID) 20 mg Oral Tablet, TAKE 1 TABLET BY MOUTH TWICE  DAILY  glimepiride (AMARYL) 4 mg Oral Tablet, TAKE 1 TABLET(4 MG) BY MOUTH TWICE DAILY WITH A MEAL FOR DIABETES  latanoprost (XALATAN) 0.005 % Ophthalmic Drops, Administer 1 Drop into the left eye Every night  Levetiracetam 500 mg Oral Tablet Sustained Release 24 hr, Take 1 Tablet (500 mg total) by mouth Every night for 90 days  magnesium oxide (MAG-OX) 400 mg Oral Tablet, Take 2 Tablets (800 mg total) by mouth Once a day  SUMAtriptan (IMITREX) 50 mg Oral Tablet, sumatriptan 50 mg tablet   TAKE 1 TABLET BY MOUTH AT ONSET OF HEADACHE. IF NO RELIEF MAY REPEAT 1 AFTER AT LEAST 2 HOURS. MAX OF 4 TABS PER 24 HOURS  venlafaxine (EFFEXOR XR) 37.5 mg Oral Capsule, Sust. Release 24 hr, Take 1 Capsule (37.5 mg total) by mouth Every night  diazePAM (VALIUM) 5 mg Oral Tablet, Take 1 Tablet (5 mg total) by mouth Once a day  ergocalciferol, vitamin D2, (DRISDOL) 1,250 mcg (50,000 unit) Oral Capsule, Take 1 Capsule (50,000 Units total) by mouth Every 7 days  HYDROcodone-acetaminophen (NORCO) 7.5-325 mg Oral Tablet, Take 1 Tablet by mouth Every 6 hours as needed for up to 30 days Indications: pain  metFORMIN (GLUCOPHAGE) 500 mg Oral Tablet, Take 2 Tablets (1,000 mg total) by mouth Every morning with breakfast  rosuvastatin (CRESTOR) 10 mg Oral Tablet, Take 1 Tablet (10 mg total) by mouth Once a day    No facility-administered medications prior to visit.    Allergies:  Allergies   Allergen Reactions   . Codeine Nausea/ Vomiting   . Amoxicillin Diarrhea   . Macrobid [Nitrofurantoin Monohyd/M-Cryst] Nausea/ Vomiting       Physical Exam:  Vitals:    04/17/22 0929   BP: 130/70   Pulse: 73   Temp: 36.7 C (98 F)   TempSrc: Tympanic   SpO2: 92%   Weight: 92.1 kg (203 lb)   Height: 1.676 m (5\' 6" )   BMI: 32.83      Physical Exam  Vitals and nursing note reviewed.   Constitutional:       Appearance: Normal appearance. She is obese.   HENT:      Right Ear: Tympanic membrane normal.      Left Ear: Tympanic membrane normal.      Mouth/Throat:       Mouth: Mucous membranes are moist.  Pharynx: Oropharynx is clear.   Eyes:      Pupils: Pupils are equal, round, and reactive to light.   Cardiovascular:      Rate and Rhythm: Normal rate and regular rhythm.      Pulses: Normal pulses.   Pulmonary:      Effort: Pulmonary effort is normal.      Breath sounds: Normal breath sounds.   Abdominal:      General: Bowel sounds are normal.      Palpations: Abdomen is soft.      Tenderness: There is no abdominal tenderness.   Musculoskeletal:         General: No swelling or tenderness. Normal range of motion.      Cervical back: Normal range of motion and neck supple.      Comments: Chronic trigger points upper back thighs due to fibromyalgia  Arthritic changes hands and knees bilateral  Gait stable   Skin:     General: Skin is warm.      Findings: No lesion or rash.   Neurological:      General: No focal deficit present.      Mental Status: She is alert and oriented to person, place, and time.   Psychiatric:         Mood and Affect: Mood normal.         Behavior: Behavior normal.         Thought Content: Thought content normal.         Judgment: Judgment normal.         Assessment/Plan:  Problem List Items Addressed This Visit        Cardiovascular System    Hypertension - Primary    Hyperlipidemia    Relevant Orders    LIPID PANEL (Completed)       Neurologic    Chronic pain       Endocrine    Uncontrolled type 2 diabetes mellitus with hyperglycemia (CMS HCC)    Relevant Orders    CBC/DIFF (Completed)    COMPREHENSIVE METABOLIC PNL, FASTING (Completed)    HGA1C (HEMOGLOBIN A1C WITH EST AVG GLUCOSE) (Completed)    URINALYSIS, MACROSCOPIC AND MICROSCOPIC W/CULTURE REFLEX (Completed)    MICROALBUMIN URINE, RANDOM (Completed)    URINE CULTURE,ROUTINE (Completed)    B12 deficiency       Psychiatric    Anxiety    Depression       Other    Hypomagnesemia    Relevant Orders    MAGNESIUM (Completed)    Dizziness   Other Visit Diagnoses     Need for hepatitis C screening test         Relevant Orders    HEPATITIS C ANTIBODY SCREEN WITH REFLEX TO HCV PCR (Completed)         Labs obtained in office  B12 shot given  Continue f/u w ENT/audiologist  Continue f/u w  Dr Aretha Parrot as scheduled  Norco 7.5mg  #90 no refills given  Refuses colonoscopy  F/u pending labs otherwise as scheduled      Follow up: Return in about 1 month (around 05/17/2022) for In Person Visit.  Seek medical attention for new or worsening symptoms.  Patient has been seen in this clinic within the last 3 years.     Xochil Shanker, PA-C          This note was partially created using MModal Fluency Direct system (voice recognition software) and is inherently subject to errors including those of syntax  and "sound-alike" substitutions which may escape proofreading.  In such instances, original meaning may be extrapolated by contextual derivation.

## 2022-04-17 NOTE — Nursing Note (Signed)
Patient here today for 3 month CDM visit and refill

## 2022-04-18 LAB — HGA1C (HEMOGLOBIN A1C WITH EST AVG GLUCOSE): HEMOGLOBIN A1C: 8 % — ABNORMAL HIGH (ref 4.0–6.0)

## 2022-04-19 ENCOUNTER — Other Ambulatory Visit (RURAL_HEALTH_CENTER): Payer: Self-pay | Admitting: Physician Assistant

## 2022-04-19 LAB — HEPATITIS C ANTIBODY SCREEN WITH REFLEX TO HCV PCR: HCV ANTIBODY QUALITATIVE: NEGATIVE

## 2022-04-19 LAB — URINE CULTURE,ROUTINE: URINE CULTURE: 5000 — AB

## 2022-04-23 ENCOUNTER — Other Ambulatory Visit (RURAL_HEALTH_CENTER): Payer: Self-pay | Admitting: Physician Assistant

## 2022-04-23 MED ORDER — METFORMIN 1,000 MG TABLET
1000.0000 mg | ORAL_TABLET | Freq: Two times a day (BID) | ORAL | 1 refills | Status: DC
Start: 2022-04-23 — End: 2023-06-10

## 2022-05-07 ENCOUNTER — Other Ambulatory Visit: Payer: Medicare Other

## 2022-05-07 ENCOUNTER — Other Ambulatory Visit: Payer: Self-pay

## 2022-05-07 DIAGNOSIS — I10 Essential (primary) hypertension: Secondary | ICD-10-CM | POA: Insufficient documentation

## 2022-05-07 LAB — CREATININE WITH EGFR
CREATININE: 1.01 mg/dL (ref 0.60–1.30)
ESTIMATED GFR: 59 mL/min/{1.73_m2} — ABNORMAL LOW (ref >59)

## 2022-05-08 ENCOUNTER — Encounter (INDEPENDENT_AMBULATORY_CARE_PROVIDER_SITE_OTHER): Payer: Self-pay | Admitting: NURSE PRACTITIONER

## 2022-05-08 ENCOUNTER — Ambulatory Visit (INDEPENDENT_AMBULATORY_CARE_PROVIDER_SITE_OTHER): Payer: Self-pay | Admitting: NURSE PRACTITIONER

## 2022-05-12 ENCOUNTER — Other Ambulatory Visit: Payer: Self-pay

## 2022-05-12 ENCOUNTER — Encounter (INDEPENDENT_AMBULATORY_CARE_PROVIDER_SITE_OTHER): Payer: Self-pay | Admitting: NURSE PRACTITIONER

## 2022-05-12 ENCOUNTER — Ambulatory Visit (INDEPENDENT_AMBULATORY_CARE_PROVIDER_SITE_OTHER): Payer: Medicare Other | Admitting: NURSE PRACTITIONER

## 2022-05-12 VITALS — Ht 66.0 in | Wt 203.0 lb

## 2022-05-12 DIAGNOSIS — H903 Sensorineural hearing loss, bilateral: Secondary | ICD-10-CM

## 2022-05-12 DIAGNOSIS — R42 Dizziness and giddiness: Secondary | ICD-10-CM

## 2022-05-12 NOTE — Progress Notes (Signed)
ENT, [place]  [place] [address]  Phone: [phone]  Fax: [phone]      Encounter Date: 05/12/2022    Patient ID: Grace Hoffman  MRN: [id number]    DOB: [birth year]  Age: 71 y.o. female         Referring Provider:    No referring provider defined for this encounter.    Reason for Visit:   Chief Complaint   Patient presents with   . Follow-up After Testing     Rc after balance eval, pt states dizziness is about the same        History of Present Illness:  Grace Hoffman is a 71 y.o. female follow up vertigo.  Vestibular eval completed.  No changes in vertigo    Patient History:  Patient Active Problem List   Diagnosis   . Orthostatic hypotension   . Diabetes mellitus, type 2 (CMS HCC)   . Hypertension   . Osteoarthritis   . Anxiety   . Depression   . Uncontrolled type 2 diabetes mellitus with hyperglycemia (CMS [place])   . Hypomagnesemia   . Myalgia   . Long-term use of high-risk medication   . Chronic pain     Current Outpatient Medications   Medication Sig   . d-mannose 500 mg Oral Capsule Take 1 Capsule (500 mg total) by mouth Twice daily   . diazePAM (VALIUM) 5 mg Oral Tablet Take 1 Tablet (5 mg total) by mouth Twice per day as needed   . enalapril (VASOTEC) 5 mg Oral Tablet Take 1 Tablet (5 mg total) by mouth Once a day   . ergocalciferol, vitamin D2, (DRISDOL) 1,250 mcg (50,000 unit) Oral Capsule Take 1 Capsule (50,000 Units total) by mouth Every 7 days   . famotidine (PEPCID) 20 mg Oral Tablet TAKE 1 TABLET BY MOUTH TWICE DAILY   . glimepiride (AMARYL) 4 mg Oral Tablet TAKE 1 TABLET(4 MG) BY MOUTH TWICE DAILY WITH A MEAL FOR DIABETES   . HYDROcodone-acetaminophen (NORCO) 7.5-325 mg Oral Tablet Take 1 Tablet by mouth Every 6 hours as needed for up to 30 days Indications: pain   . latanoprost (XALATAN) 0.005 % Ophthalmic Drops Administer 1 Drop into the left eye Every night   . Levetiracetam 500 mg Oral Tablet Sustained Release 24 hr Take 1 Tablet (500 mg total) by mouth Every  night for 90 days   . magnesium oxide (MAG-OX) 400 mg Oral Tablet Take 2 Tablets (800 mg total) by mouth Once a day   . MetFORMIN (GLUCOPHAGE) 1,000 mg Oral Tablet Take 1 Tablet (1,000 mg total) by mouth Twice daily with food   . rosuvastatin (CRESTOR) 10 mg Oral Tablet TAKE 1 TABLET BY MOUTH EVERY NIGHT AT BEDTIME   . rosuvastatin (CRESTOR) 20 mg Oral Tablet Take 1 Tablet (20 mg total) by mouth Every evening   . SUMAtriptan (IMITREX) 50 mg Oral Tablet sumatriptan 50 mg tablet   TAKE 1 TABLET BY MOUTH AT ONSET OF HEADACHE. IF NO RELIEF MAY REPEAT 1 AFTER AT LEAST 2 HOURS. MAX OF 4 TABS PER 24 HOURS   . venlafaxine (EFFEXOR XR) 37.5 mg Oral Capsule, Sust. Release 24 hr Take 1 Capsule (37.5 mg total) by mouth Every night     Allergies   Allergen Reactions   . Codeine Nausea/ Vomiting   . Amoxicillin Diarrhea   . Macrobid [Nitrofurantoin Monohyd/M-Cryst] Nausea/ Vomiting     Past Medical History:   Diagnosis Date   .  Anxiety    . Chronic back pain    . COVID-19 vaccine series completed    . Depression    . Diabetes mellitus, type 2 (CMS HCC)    . Fibromyalgia affecting multiple sites    . Hypertension    . Hypomagnesemia    . Insomnia disorder    . Migraine    . Mixed hyperlipidemia    . Orthostatic hypotension    . Osteoarthritis    . Unspecified glaucoma(365.9)    . Vitamin D deficiency       Past Surgical History:   Procedure Laterality Date   . HX CHOLECYSTECTOMY     . HX KNEE REPLACMENT Left       Family Medical History:     Family history is unknown by patient.          Social History     Tobacco Use   . Smoking status: Never   . Smokeless tobacco: Never   Substance Use Topics   . Alcohol use: Never   . Drug use: Never       Review of Systems   Constitutional: Negative.    HENT: Positive for hearing loss.    Respiratory: Negative.    Musculoskeletal: Positive for gait problem.   Allergic/Immunologic: Negative.    Neurological: Positive for dizziness.   Hematological: Negative.    Psychiatric/Behavioral: Negative.          Physical Exam:  Ht 1.676 m (5\' 6" )   Wt 92.1 kg (203 lb)   BMI 32.77 kg/m       GENERAL APPEARANCE  Obese  MOOD AND AFFECT  Normal  COMMUNICATION  Normal  NEURO EXAM  Cranial II-XII intact, grossly intact and alert and oriented x3  HEAD & NECK  Lesions:  No  Scars:  No  Normal parotid gland:  Yes  Normal submandibular gland:  Yes  TMJ:  Normal  Neck mass:  No  Neck tenderness:  No  Lymphadenopathy:  No  Thyroid normal:  Yes  NOSE  Normal to inspection  ORAL CAVITY  Normal gums, lips, teeth:  Yes  Oropharynx normal:  Yes  RIGHT EAR  Normal to inspection:  Yes  Tympanic membrane:  Intact  External ears normal:  Yes  Internal canal:  Normal  LEFT EAR  Normal to inspection:  Yes  Tympanic membrane:  Intact  External ears normal:  Yes  Internal canal:  Normal    Assessment:  ENCOUNTER DIAGNOSES     ICD-10-CM   1. 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  Reviewed audiogram and vestibular testing from [facility].  Asymmetric SNHL right ear and abnormal ABR right ear, abnormal ocular motors, and canalith repositioning completed.  No changes in vertigo after repositioning.  I recommend MRI Brain attn IACs, concern for CP tumor.  Patient states she has a MRI scheduled tomorrow at [facility] radiology.  Called community [facility] to have attn to IACs.  Will call patient once I review results.    No orders of the defined types were placed in this encounter.    No follow-ups on file.    Emiliano Dyer, FNP-BC  04/08/2022, 13:43

## 2022-05-13 IMAGING — MR MRI  BRAIN & IACS W/WO
10 of 15 series · 32 of 48 positions shown · IV contrast (gadavist)
Comparison: None available.

﻿EXAM:  MRI  BRAIN & IACS W/WO
INDICATION: Right-sided hearing loss, dizziness and ataxia.
TECHNIQUE: Multiplanar multisequential MRI of the brain and internal auditory canals was performed without and with 10 mL of Gadavist.

[Series 6: DWI · axial · 5.0mm · 1.35mm/px · z∈[-64,+62]mm · 8 of 88 slices shown (1 of 3)]
[im 1/88]
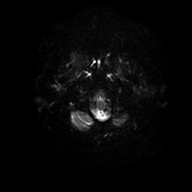
[im 13/88]
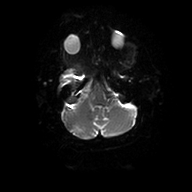
[im 25/88]
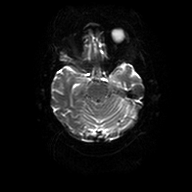
[im 38/88]
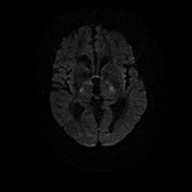
[im 50/88]
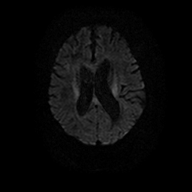
[im 63/88]
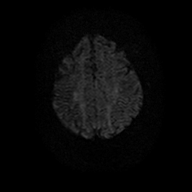
[im 75/88]
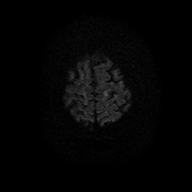
[im 88/88]
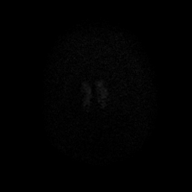

[Series 7: DWI · axial · 5.0mm · 1.35mm/px · z∈[-64,+62]mm · 2 of 22 slices shown (2 of 3)]
[im 1/22]
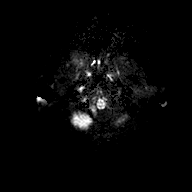
[im 22/22]
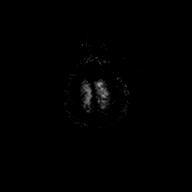

[Series 8: DWI · axial · 5.0mm · 1.35mm/px · z∈[-64,+62]mm · 2 of 22 slices shown (3 of 3)]
[im 1/22]
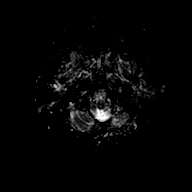
[im 22/22]
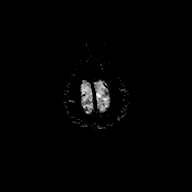

[Series 9: FLAIR · sagittal · 4.0mm · 0.75mm/px · 3 of 26 slices shown (1 of 3)]
[im 1/26]
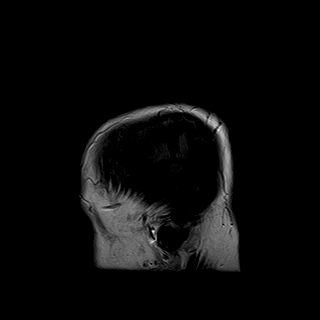
[im 13/26]
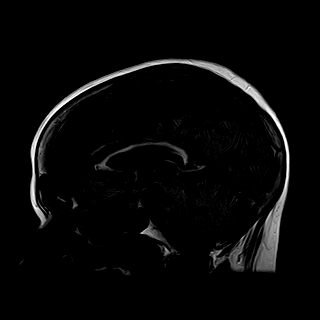
[im 26/26]
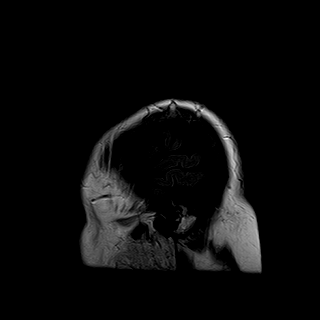

[Series 10: T2 · axial · 4.0mm · 0.43mm/px · z∈[-70,+70]mm · 4 of 36 slices shown]
[im 1/36]
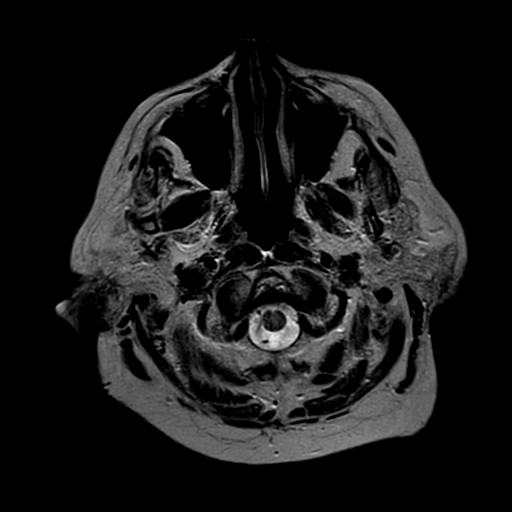
[im 12/36]
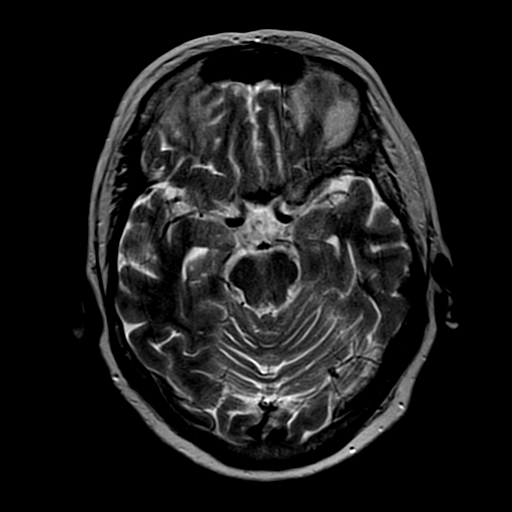
[im 24/36]
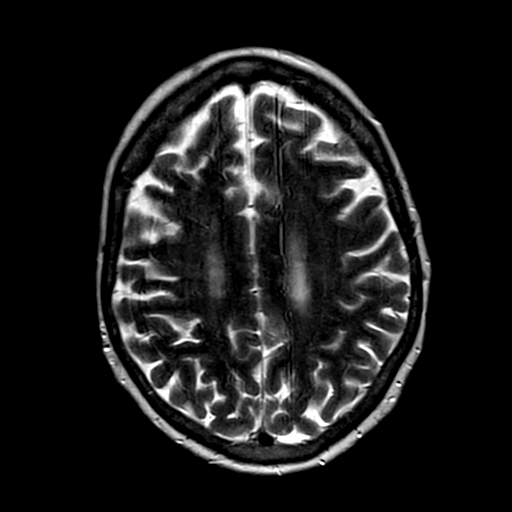
[im 36/36]
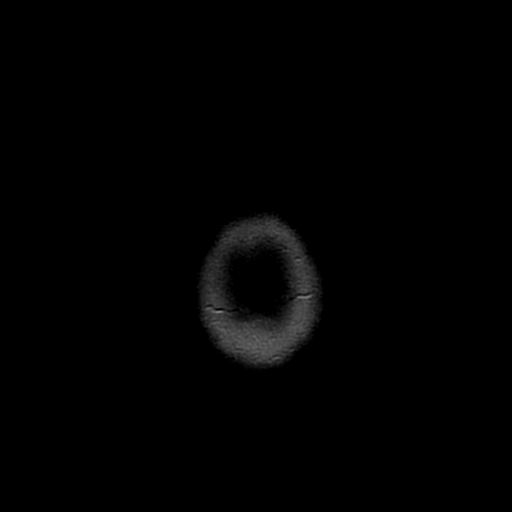

[Series 11: FLAIR · axial · 4.0mm · 0.43mm/px · z∈[-70,+70]mm · 4 of 36 slices shown (2 of 3)]
[im 1/36]
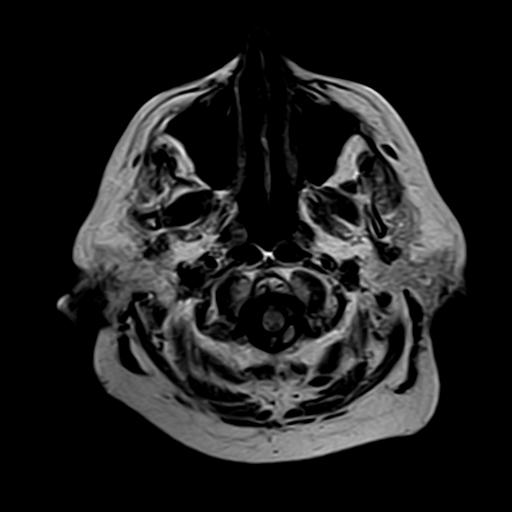
[im 12/36]
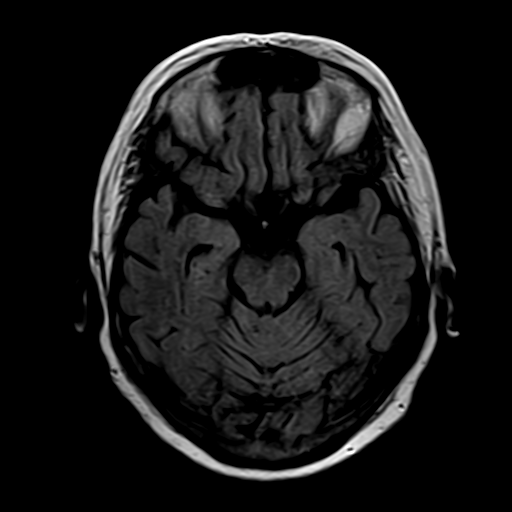
[im 24/36]
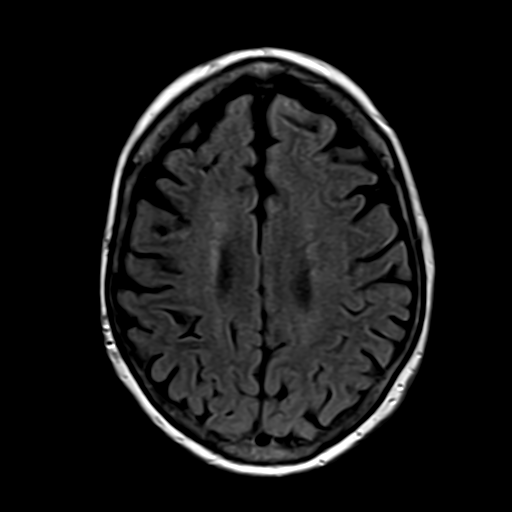
[im 36/36]
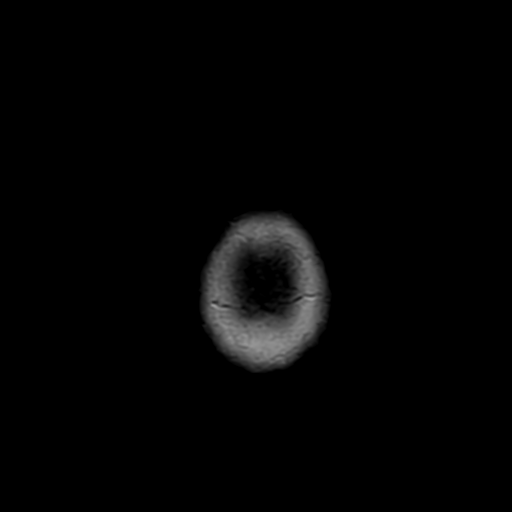

[Series 12: T1 · axial · 4.0mm · 0.43mm/px · z∈[-70,-26]mm · 2 of 36 slices shown]
[im 1/36]
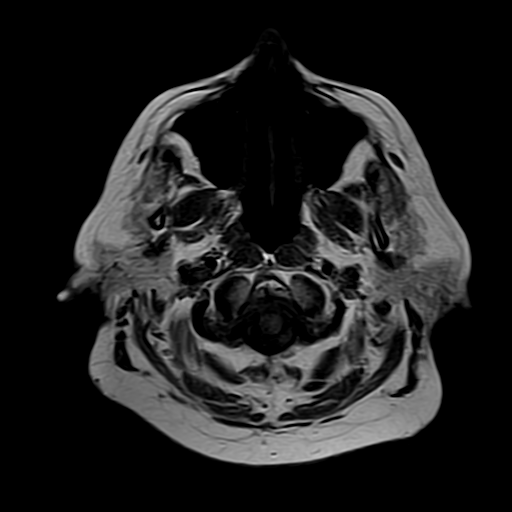
[im 12/36]
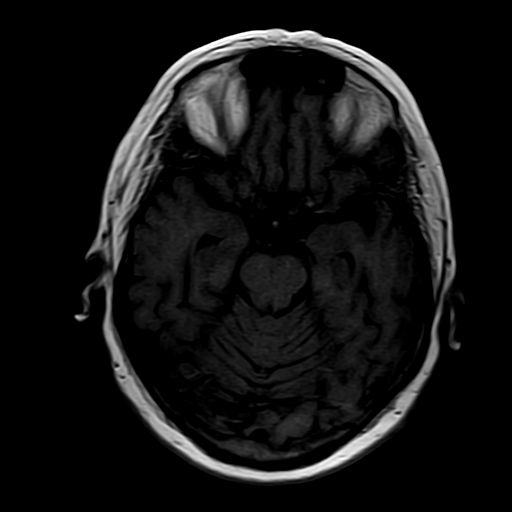

[Series 17: FLAIR · axial · 5.0mm · 0.69mm/px · z∈[-63,+63]mm · 2 of 22 slices shown (3 of 3)]
[im 1/22]
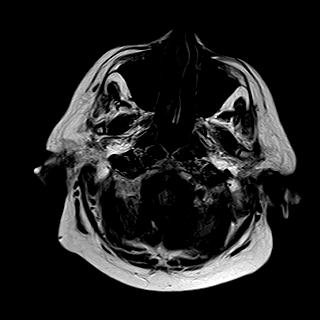
[im 22/22]
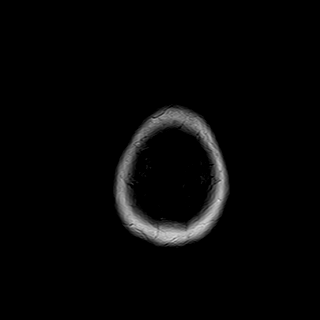

[Series 18: T1 post-contrast · axial · 3.0mm · 0.47mm/px · 1 of 11 slices shown (1 of 2)]
[im 1/11]
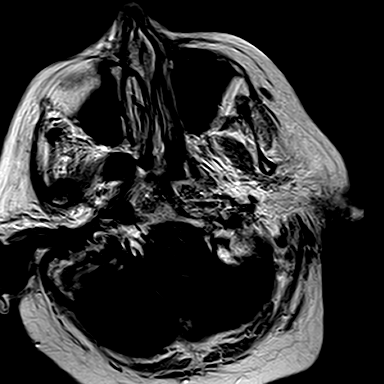

[Series 20: T1 post-contrast · axial · 4.0mm · 0.43mm/px · z∈[-70,+70]mm · 4 of 36 slices shown (2 of 2)]
[im 1/36]
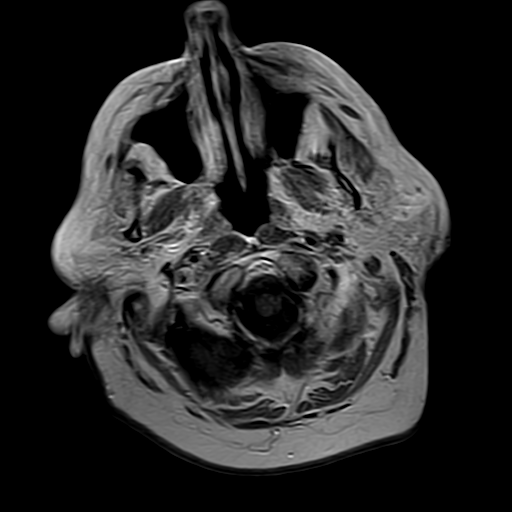
[im 12/36]
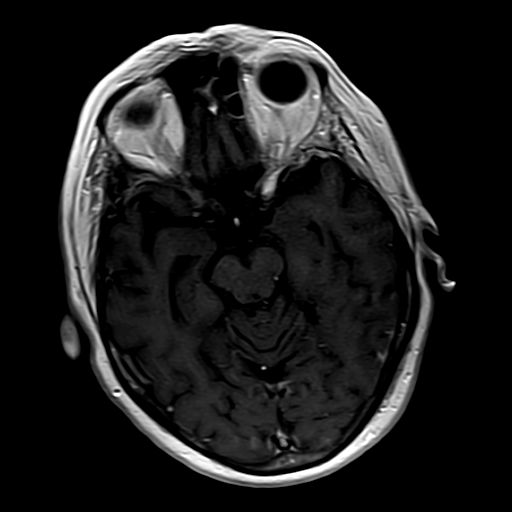
[im 24/36]
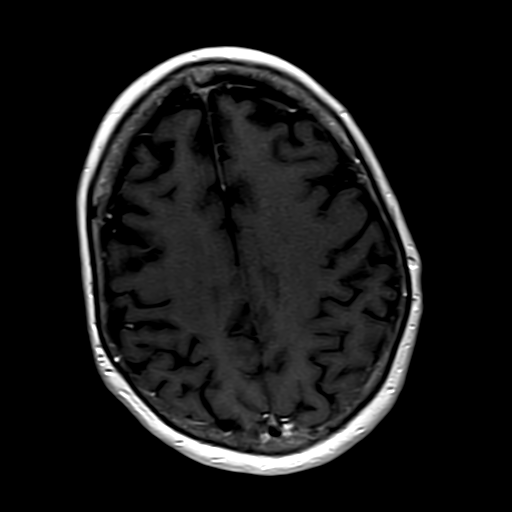
[im 36/36]
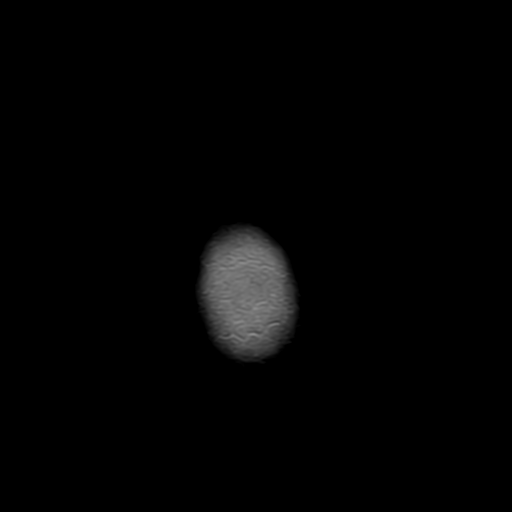

[32 of 48 positions shown; findings below may reference images not displayed]

FINDINGS: Examination is significantly degraded due to excessive patient motion. Ventricular and sulcal size is normal for the patient's age. There are mild chronic small vessel ischemic changes.  Small right superior frontal subcortical infarct is also identified.  There is also a right thalamic lacunar infarct.  There is no mass effect, midline shift or intracranial hemorrhage. There is no evidence of acute infarction or prior microhemorrhages. Skull base flow voids and basal cisterns are patent. There is no cerebellopontine angle mass. Normal T2 signal intensity is noted within the cochlea, vestibule and semicircular canals bilaterally. Sagittal survey of midline structures is unremarkable. Following intravenous contrast administration, there is no abnormal parenchymal or leptomeningeal enhancement. No abnormal enhancement of the internal auditory canals is seen. There are no extra-axial fluid collections. Visualized paranasal sinuses, mastoid air cells and orbital contents are unremarkable.
IMPRESSION: 1. Significantly degraded exam due to excessive patient motion.  Mild chronic small vessel ischemic changes, no acute intracranial abnormality. 

 2. No abnormal parenchymal or leptomeningeal enhancement. 

3. Unremarkable internal auditory canals.

## 2022-05-15 ENCOUNTER — Ambulatory Visit (RURAL_HEALTH_CENTER): Payer: Medicare Other | Attending: Physician Assistant | Admitting: Physician Assistant

## 2022-05-15 ENCOUNTER — Other Ambulatory Visit: Payer: Self-pay

## 2022-05-15 ENCOUNTER — Encounter (RURAL_HEALTH_CENTER): Payer: Self-pay | Admitting: Physician Assistant

## 2022-05-15 VITALS — BP 140/80 | HR 87 | Temp 97.2°F | Ht 66.0 in | Wt 197.0 lb

## 2022-05-15 DIAGNOSIS — R2681 Unsteadiness on feet: Secondary | ICD-10-CM | POA: Insufficient documentation

## 2022-05-15 DIAGNOSIS — E1165 Type 2 diabetes mellitus with hyperglycemia: Secondary | ICD-10-CM | POA: Insufficient documentation

## 2022-05-15 DIAGNOSIS — N390 Urinary tract infection, site not specified: Secondary | ICD-10-CM

## 2022-05-15 DIAGNOSIS — F419 Anxiety disorder, unspecified: Secondary | ICD-10-CM

## 2022-05-15 DIAGNOSIS — E538 Deficiency of other specified B group vitamins: Secondary | ICD-10-CM

## 2022-05-15 DIAGNOSIS — G8929 Other chronic pain: Secondary | ICD-10-CM | POA: Insufficient documentation

## 2022-05-15 DIAGNOSIS — G894 Chronic pain syndrome: Secondary | ICD-10-CM | POA: Insufficient documentation

## 2022-05-15 DIAGNOSIS — R531 Weakness: Secondary | ICD-10-CM

## 2022-05-15 DIAGNOSIS — I10 Essential (primary) hypertension: Secondary | ICD-10-CM | POA: Insufficient documentation

## 2022-05-15 DIAGNOSIS — Z87440 Personal history of urinary (tract) infections: Secondary | ICD-10-CM | POA: Insufficient documentation

## 2022-05-15 DIAGNOSIS — Z7984 Long term (current) use of oral hypoglycemic drugs: Secondary | ICD-10-CM | POA: Insufficient documentation

## 2022-05-15 MED ORDER — HYDROCODONE 7.5 MG-ACETAMINOPHEN 325 MG TABLET
1.0000 | ORAL_TABLET | Freq: Four times a day (QID) | ORAL | 0 refills | Status: DC | PRN
Start: 2022-05-15 — End: 2022-06-12

## 2022-05-15 MED ORDER — DIAZEPAM 5 MG TABLET
5.0000 mg | ORAL_TABLET | Freq: Every day | ORAL | 0 refills | Status: DC
Start: 2022-05-15 — End: 2022-06-12

## 2022-05-15 MED ORDER — CYANOCOBALAMIN (VIT B-12) 1,000 MCG/ML INJECTION SOLUTION
1000.0000 ug | Freq: Once | INTRAMUSCULAR | Status: AC
Start: 2022-05-15 — End: 2022-05-15
  Administered 2022-05-15: 1000 ug via INTRAMUSCULAR

## 2022-05-15 NOTE — Nursing Note (Signed)
Patient is here for routine 1 month follow up, with complaints of feeling weak and she states that she staggers more recently.

## 2022-05-15 NOTE — Nursing Note (Signed)
05/15/22 1600   Urine test  (S[employer] Multistix 10 SG)   Time collected 1641   Color (Ref Range: Yellow) (!) Dark Yellow   Clarity (Ref Range: Clear) (!) Cloudy   Glucose (Ref Range: Negative mg/dL) Negative   Bilirubin (Ref Range: Negative mg/dL) Negative   Ketones (Ref Range: Negative mg/dL) Negative   Urine Specific Gravity (Ref Range: 1.005 - 1.030) 1.015   Blood (urine) (Ref Range: Negative mg/dL) Negative   pH (Ref Range: 5.0 - 8.0) 5.0   Protein (Ref Range: Negative mg/dL) Negative   Urobilinogen (Ref Range: Negative mg/dL) Normal    Nitrite (Ref Range: Negative) Negative   Leukocytes (Ref Range: Negative WBC's/uL) Negative   Micro Sent no   Culture Sent no   Initials KD

## 2022-05-15 NOTE — Progress Notes (Signed)
INTERNAL MEDICINE, [facility]NAL [facility]IC[facility]  [address]  [place] [address]  Operated by [facility]  History and Physical     Name: Grace Hoffman MRN:  [id number]   Date: 05/15/2022 Age: 71 y.o.               Follow Up        Reason for Visit: Follow Up (Routine 1 month)    History of Present Illness  Grace Hoffman is a 71 y.o. female who is being seen today in the office for f/u complaints of increased weakness noted along with gait instability.  It is noted that she is had issues with dizziness now for couple months and been seen by the ENT where she had her crystals realigned.  She states this helped a little bit but she feels like her balance is still an issue.  She also reports a mild tremor which concerns her.  Patient states it is worse at times and mainly noted in her hands.  Patient does see Dr. Aretha Parrot routinely and has an appointment for follow-up next week.  History of migraines also noted for which he treats.  Patient denies a falls or syncope.  She does note a history of recurrent UTIs which increase her weakness like urinalysis checked today.  No current dysuria hematuria.    Follow-up blood pressure/hypertension currently 140/80.  Patient had had issues in the past with low blood blood pressure for which medications were adjusted.  She is currently taking Vasotec 2.5 mg daily as well as hydrochlorothiazide 25 mg daily p.r.n..  Blood pressures have been recorded given checked outpatient.  Denies chest pain.    F/U BS w history of uncontrolled diabetes with hemoglobin A1c 7.3.  Currently patient is on metformin at 1000 mg twice daily along with glimepiride 4 mg twice daily. BS this am 93, previously noted she was unable to tolerate being injectables such as Ozempic and Mounjaro due to GI side effects.  Denies increased thirst or urination.    Follow-up B12 deficiency with chronic fatigue issues noted.  Last hemoglobin hematocrit were stable.   Patient requesting B12 shot today.    Follow-up chronic pain/DJD/fibromyalgia for which she has been on chronic pain therapy/narcotics for numerous years; pain is stable at this current time on dosing no abuse history is noted; patient also compliant w UDS; patient states that she had a MRI on her back and she states that she took it to Dr. Vear Clock and Dr. Vear Clock thinks that she has a bulging disc in her thoracic region; requesting medication refill.    Follow-up chronic depression/anxiety with patient states she is doing well;  she continues to take her Valium twice a day as needed for anxiety; currently denies any panic attacks suicidal thoughts ideation    History of glaucoma; continues to follow with ophthalmologist      PHQ Questionnaire  Little interest or pleasure in doing things.: Not at all  Feeling down, depressed, or hopeless: Not at all  PHQ 2 Total: 0      Past Medical History:   Diagnosis Date   . Anxiety    . Chronic back pain    . COVID-19 vaccine series completed    . Depression    . Diabetes mellitus, type 2 (CMS HCC)    . Fibromyalgia affecting multiple sites    . Hypertension    . Hypomagnesemia    . Insomnia disorder    .  Migraine    . Mixed hyperlipidemia    . Orthostatic hypotension    . Osteoarthritis    . Unspecified glaucoma(365.9)    . Vitamin D deficiency          Past Surgical History:   Procedure Laterality Date   . HX CHOLECYSTECTOMY     . HX KNEE REPLACMENT Left    . HX ROTATOR CUFF REPAIR Left    . SKIN CANCER EXCISION      forehead removed      Family Medical History:     Problem Relation (Age of Onset)    Kidney failure Mother    No Known Problems Sister, Brother, Half-Sister, Half-Brother, Maternal Aunt, Maternal Uncle, Paternal Aunt, Paternal Uncle, Maternal Grandmother, Maternal Grandfather, Paternal Grandmother, Paternal Grandfather, Daughter, Son    Respiratory Problems Father          Social History     Tobacco Use   . Smoking status: Never   . Smokeless tobacco: Never    Vaping Use   . Vaping Use: Never used   Substance Use Topics   . Alcohol use: Never   . Drug use: Never     Medication:  d-mannose 500 mg Oral Capsule, Take 1 Capsule (500 mg total) by mouth Twice daily  ergocalciferol, vitamin D2, (DRISDOL) 1,250 mcg (50,000 unit) Oral Capsule, Take 1 Capsule (50,000 Units total) by mouth Every 7 days  famotidine (PEPCID) 20 mg Oral Tablet, TAKE 1 TABLET BY MOUTH TWICE DAILY  glimepiride (AMARYL) 4 mg Oral Tablet, TAKE 1 TABLET(4 MG) BY MOUTH TWICE DAILY WITH A MEAL FOR DIABETES  latanoprost (XALATAN) 0.005 % Ophthalmic Drops, Administer 1 Drop into the left eye Every night  Levetiracetam 500 mg Oral Tablet Sustained Release 24 hr, Take 1 Tablet (500 mg total) by mouth Every night for 90 days  magnesium oxide (MAG-OX) 400 mg Oral Tablet, Take 2 Tablets (800 mg total) by mouth Once a day  MetFORMIN (GLUCOPHAGE) 1,000 mg Oral Tablet, Take 1 Tablet (1,000 mg total) by mouth Twice daily with food  rosuvastatin (CRESTOR) 10 mg Oral Tablet, TAKE 1 TABLET BY MOUTH EVERY NIGHT AT BEDTIME  SUMAtriptan (IMITREX) 50 mg Oral Tablet, sumatriptan 50 mg tablet   TAKE 1 TABLET BY MOUTH AT ONSET OF HEADACHE. IF NO RELIEF MAY REPEAT 1 AFTER AT LEAST 2 HOURS. MAX OF 4 TABS PER 24 HOURS  venlafaxine (EFFEXOR XR) 37.5 mg Oral Capsule, Sust. Release 24 hr, Take 1 Capsule (37.5 mg total) by mouth Every night  diazePAM (VALIUM) 5 mg Oral Tablet, Take 1 Tablet (5 mg total) by mouth Once a day  enalapril (VASOTEC) 5 mg Oral Tablet, Take 1 Tablet (5 mg total) by mouth Once a day  HYDROcodone-acetaminophen (NORCO) 7.5-325 mg Oral Tablet, Take 1 Tablet by mouth Every 6 hours as needed for up to 30 days Indications: pain  rosuvastatin (CRESTOR) 20 mg Oral Tablet, Take 1 Tablet (20 mg total) by mouth Every evening (Patient not taking: Reported on 06/03/2022)    No facility-administered medications prior to visit.    Allergies:  Allergies   Allergen Reactions   . Codeine Nausea/ Vomiting   . Amoxicillin Diarrhea    . Macrobid [Nitrofurantoin Monohyd/M-Cryst] Nausea/ Vomiting       Physical Exam:  Vitals:    05/15/22 1436   BP: (!) 140/80   Pulse: 87   Temp: 36.2 C (97.2 F)   TempSrc: Tympanic   SpO2: 97%   Weight: 89.4 kg (197 lb)  Height: 1.676 m (5\' 6" )   BMI: 31.86      Physical Exam  Vitals and nursing note reviewed.   Constitutional:       Appearance: Normal appearance. She is obese.   HENT:      Right Ear: Tympanic membrane normal.      Left Ear: Tympanic membrane normal.      Mouth/Throat:      Mouth: Mucous membranes are moist.      Pharynx: Oropharynx is clear.   Eyes:      Pupils: Pupils are equal, round, and reactive to light.   Cardiovascular:      Rate and Rhythm: Normal rate and regular rhythm.      Pulses: Normal pulses.   Pulmonary:      Effort: Pulmonary effort is normal.      Breath sounds: Normal breath sounds.   Abdominal:      General: Bowel sounds are normal.      Palpations: Abdomen is soft.      Tenderness: There is abdominal tenderness (Suprapubic). There is no right CVA tenderness, left CVA tenderness or rebound.   Musculoskeletal:         General: No swelling or tenderness. Normal range of motion.      Cervical back: Normal range of motion and neck supple.      Comments: Arthritic changes hands and knees noted bilateral  Chronic trigger points upper back and thigh area  Chronic low back pain with range of motion  Negative straight leg raises  Gait stable   Skin:     General: Skin is warm.      Findings: No lesion or rash.   Neurological:      General: No focal deficit present.      Mental Status: She is alert and oriented to person, place, and time.      Sensory: No sensory deficit.      Motor: Weakness (Strength of upper and lower extremities decreased however equal bilateral) present.      Gait: Gait normal.   Psychiatric:         Mood and Affect: Mood normal.         Behavior: Behavior normal.         Thought Content: Thought content normal.         Judgment: Judgment normal.       Urine Dip  Results:   Time collected: 1641  Glucose (Ref Range: Negative mg/dL): Negative  Bilirubin (Ref Range: Negative mg/dL): Negative  Ketones (Ref Range: Negative mg/dL): Negative  Urine Specific Gravity (Ref Range: 1.005 - 1.030): 1.015  Blood (urine) (Ref Range: Negative mg/dL): Negative  pH (Ref Range: 5.0 - 8.0): 5.0  Protein (Ref Range: Negative mg/dL): Negative  Urobilinogen (Ref Range: Negative mg/dL): Normal   Nitrite (Ref Range: Negative): Negative  Leukocytes (Ref Range: Negative WBC's/uL): Negative      Assessment/Plan:  Problem List Items Addressed This Visit        Cardiovascular System    Hypertension       Neurologic    Chronic pain       Nephrology    Recurrent UTI    Relevant Orders    POCT URINE DIPSTICK       Endocrine    Uncontrolled type 2 diabetes mellitus with hyperglycemia (CMS HCC)    B12 deficiency       Psychiatric    Anxiety   Other Visit Diagnoses     Weakness    -  Primary    Gait instability             Urinalysis unremarkable today  Encouraged to continue hydration  B12 shot given  Will follow-up with neurologist next week as scheduled  Encouraged cane for stability  Continue blood pressure/blood sugar monitoring outpatient  Norco 7.5mg  #90 no refills given along with Valium 5 mg daily Number 30 no refills  Refuses colonoscopy  Any acute issues seek ER    Follow up: Return in about 1 month (around 06/15/2022) for In Person Visit.  Seek medical attention for new or worsening symptoms.  Patient has been seen in this clinic within the last 3 years.     Grace [place], PA-C          This note was partially created using MModal Fluency Direct system (voice recognition software) and is inherently subject to errors including those of syntax and "sound-alike" substitutions which may escape proofreading.  In such instances, original meaning may be extrapolated by contextual derivation.

## 2022-05-20 DIAGNOSIS — E538 Deficiency of other specified B group vitamins: Secondary | ICD-10-CM | POA: Insufficient documentation

## 2022-05-20 DIAGNOSIS — E785 Hyperlipidemia, unspecified: Secondary | ICD-10-CM | POA: Insufficient documentation

## 2022-05-20 DIAGNOSIS — R42 Dizziness and giddiness: Secondary | ICD-10-CM | POA: Insufficient documentation

## 2022-05-26 ENCOUNTER — Other Ambulatory Visit (RURAL_HEALTH_CENTER): Payer: Self-pay | Admitting: Physician Assistant

## 2022-05-28 ENCOUNTER — Other Ambulatory Visit (RURAL_HEALTH_CENTER): Payer: Self-pay | Admitting: Physician Assistant

## 2022-05-29 IMAGING — MR MRI ANKLE RT W WO CONTRAST
7 of 10 series · 26 of 40 positions shown · IV contrast (gadavist)
Comparison: None.

﻿EXAM:  [id number]   MRI ANKLE RT W WO CONTRAST
INDICATION: Right foot pain, chronic osteomyelitis heel area.
TECHNIQUE: Multiplanar, multisequence MRI was performed both before and after the intravenous administration of 5 mL Gadavist.

[Series 10: T1 · sagittal · right · 3.0mm · 0.40mm/px · 3 of 20 slices shown (1 of 3)]
[im 1/20]
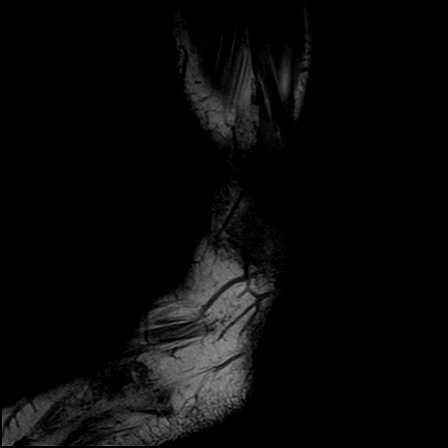
[im 10/20]
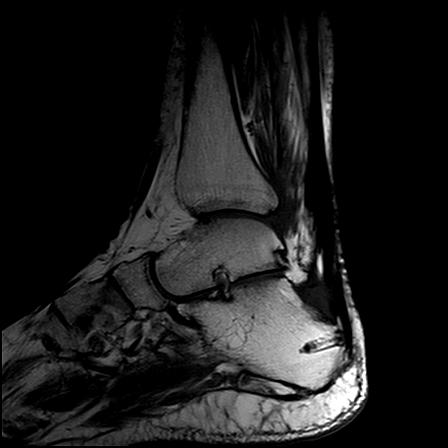
[im 20/20]
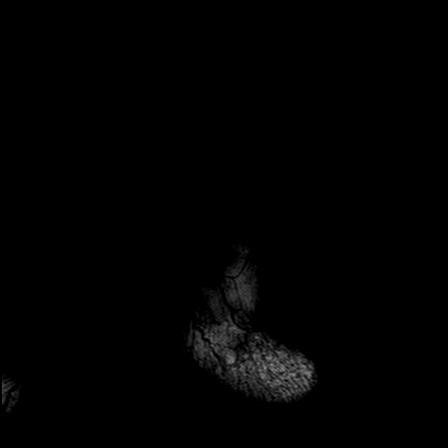

[Series 11: T1 fat-sat · sagittal · right · 3.0mm · 0.40mm/px · 3 of 20 slices shown]
[im 1/20]
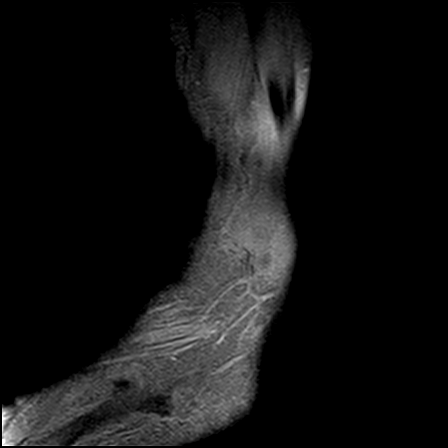
[im 10/20]
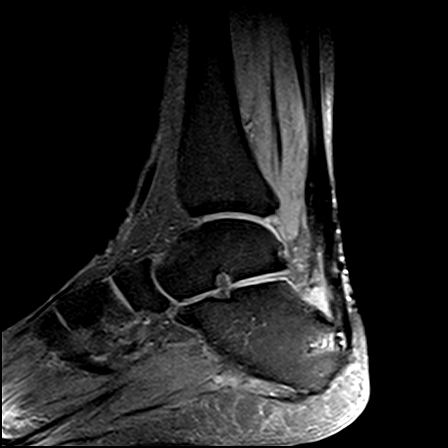
[im 20/20]
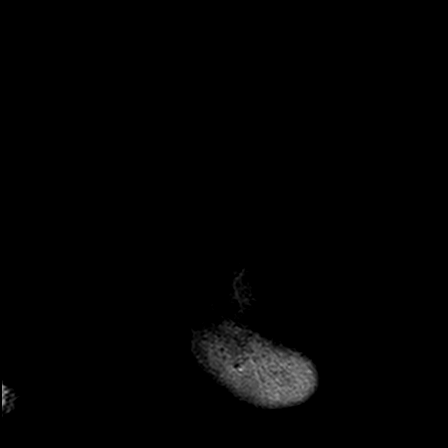

[Series 12: T1 · coronal · right · 4.0mm · 0.37mm/px · 4 of 26 slices shown (2 of 3)]
[im 1/26]
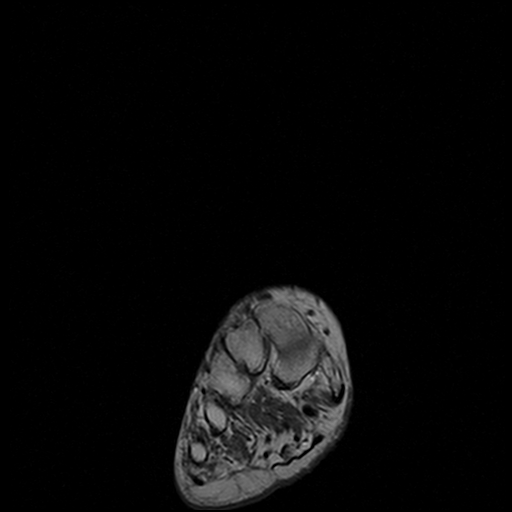
[im 9/26]
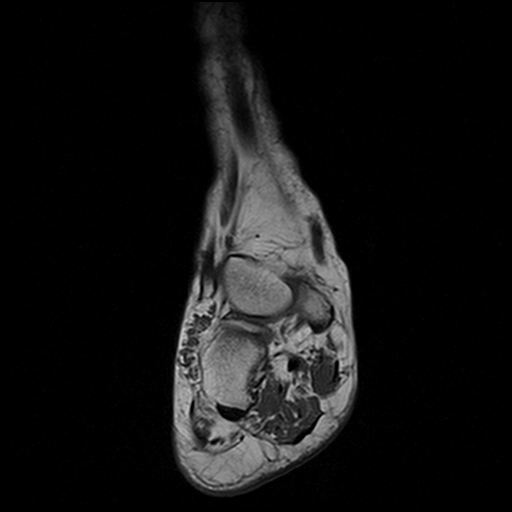
[im 17/26]
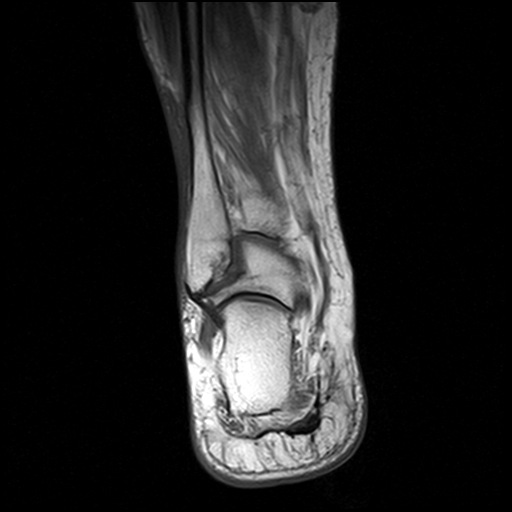
[im 26/26]
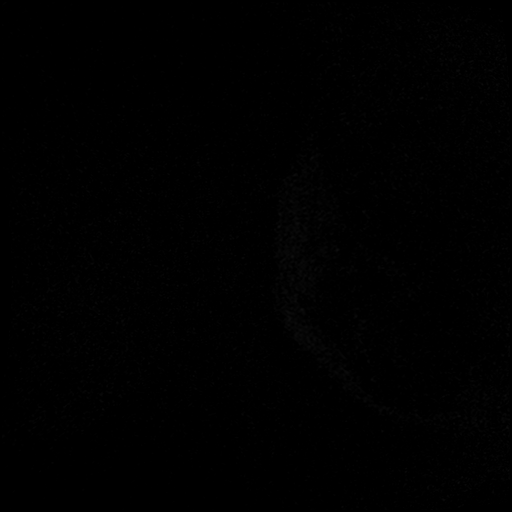

[Series 13: T1 · axial · right · 4.5mm · 0.35mm/px · z∈[-95,+74]mm · 5 of 35 slices shown (3 of 3)]
[im 1/35]
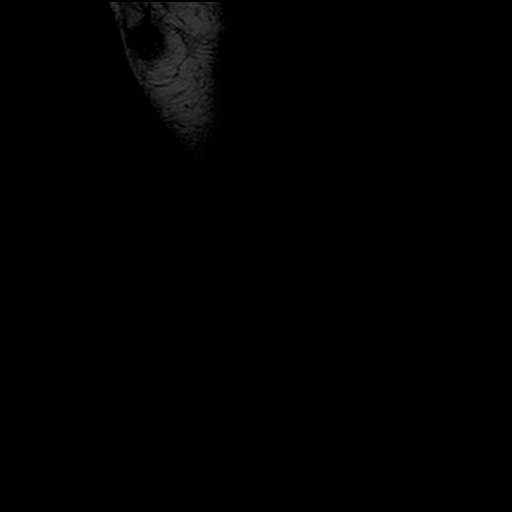
[im 9/35]
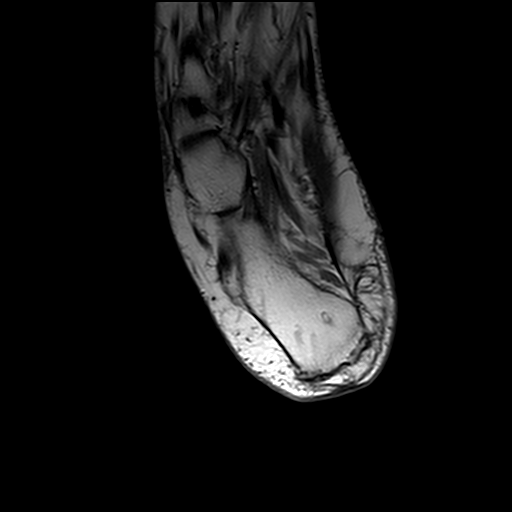
[im 18/35]
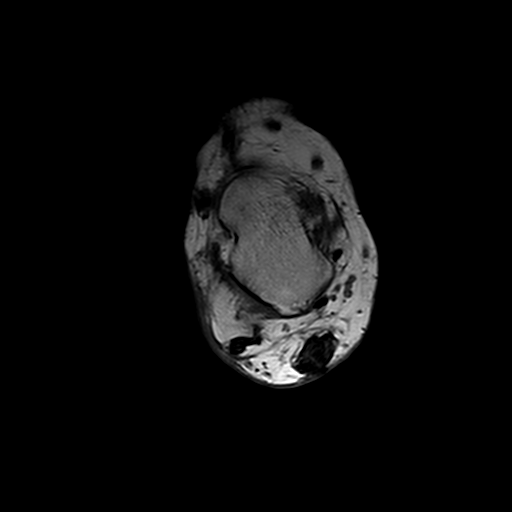
[im 26/35]
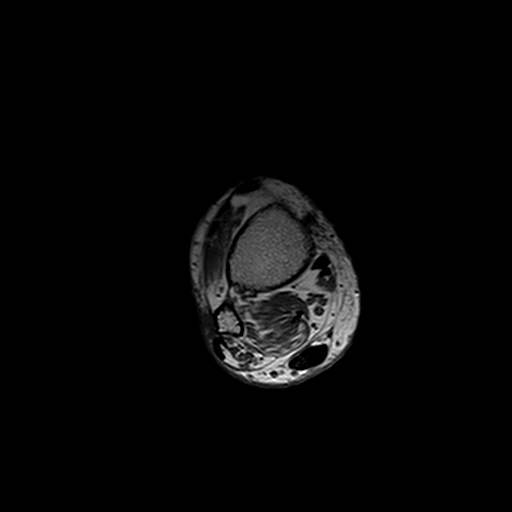
[im 35/35]
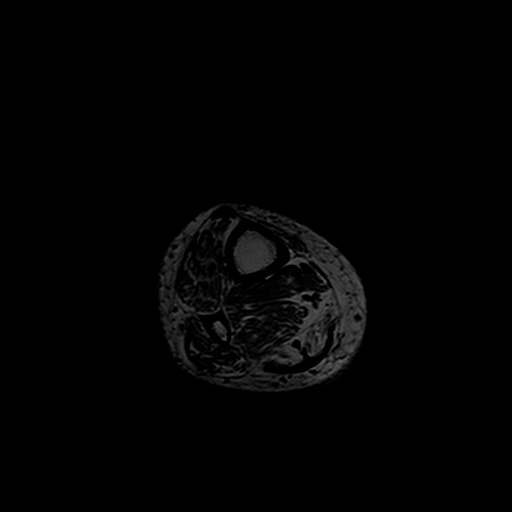

[Series 14: T2 fat-sat · axial · right · 4.5mm · 0.40mm/px · z∈[-95,+74]mm · 5 of 35 slices shown]
[im 1/35]
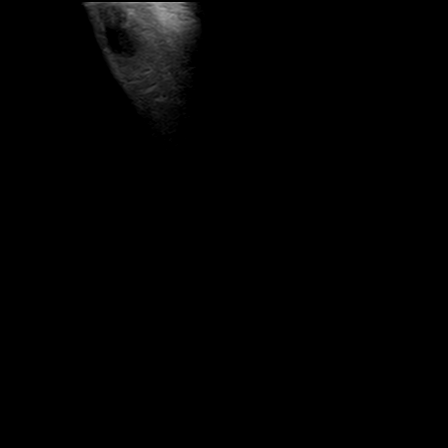
[im 9/35]
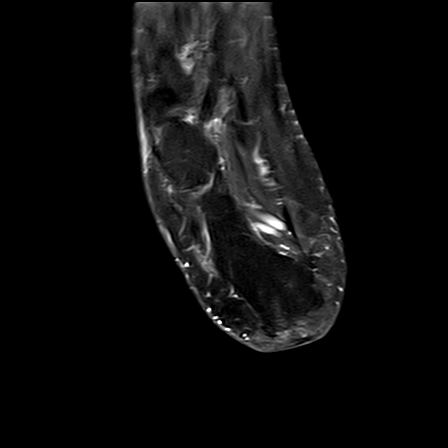
[im 18/35]
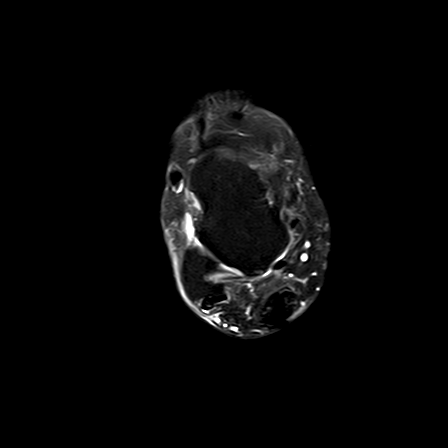
[im 26/35]
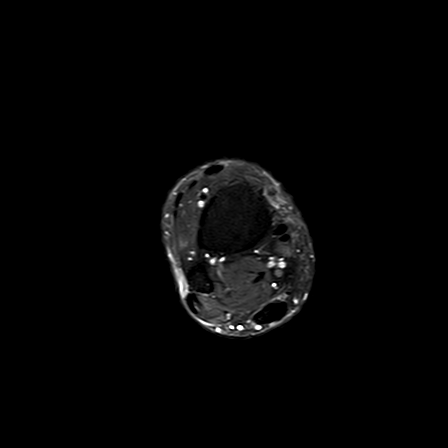
[im 35/35]
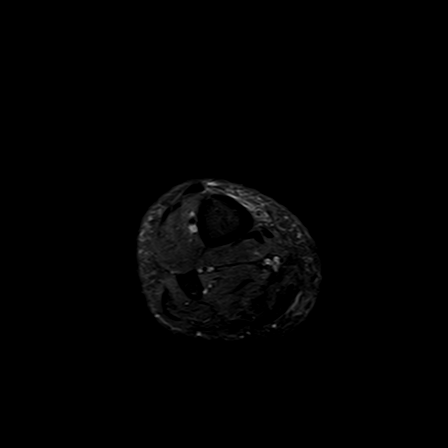

[Series 16: T1 fat-sat post-contrast · axial · right · 4.5mm · 0.56mm/px · z∈[-95,+74]mm · 5 of 35 slices shown (1 of 2)]
[im 1/35]
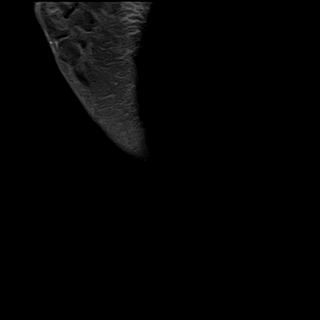
[im 9/35]
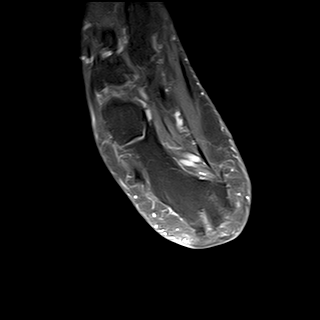
[im 18/35]
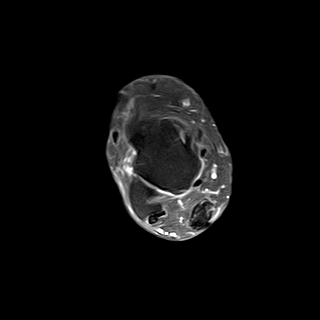
[im 26/35]
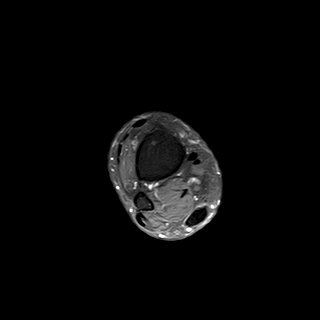
[im 35/35]
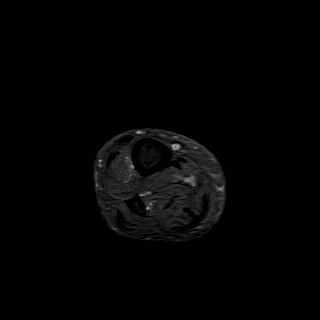

[Series 17: T1 fat-sat post-contrast · coronal · right · 4.0mm · 0.59mm/px · 1 of 26 slices shown (2 of 2)]
[im 1/26]
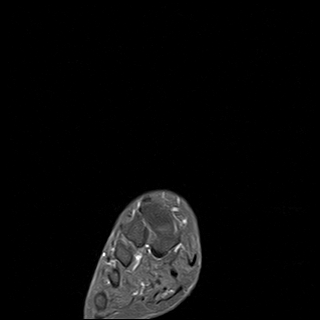

[26 of 40 positions shown; findings below may reference images not displayed]

FINDINGS: There are postsurgical changes compatible with prior Achilles tendon repair.  Fasteners are seen within the posterior calcaneus adjacent to the distal Achilles tendon.  There is a small fluid collection situated between the distal Achilles tendon and the adjacent calcaneus.  There is also a small amount of joint fluid.  However, there is no definite evidence of osteomyelitis.  No abnormal enhancement is seen, and the calcaneal marrow signal is intact aside from postsurgical change.  

There are mild, diffuse degenerative changes.  The visualized tendons and ligaments appear to be intact.  The plantar fascia appears unremarkable as does the sinus tarsi.  No fracture or dislocation is evident.
IMPRESSION: 1. Postsurgical changes.

2. Small fluid collection as described with no definite evidence of osteomyelitis.

## 2022-05-29 NOTE — Telephone Encounter (Signed)
Last filled 05/14/22

## 2022-06-02 IMAGING — US CAROTID US W/DOPPLER
1 series · 14 of 24 positions shown · non-contrast
Comparison: None available.

﻿EXAM:  CAROTID US W/DOPPLER
INDICATION: Bruit, dizziness.

[Series 1: carotid us w/doppler · 14 of 75 slices shown]
[im 1/75]
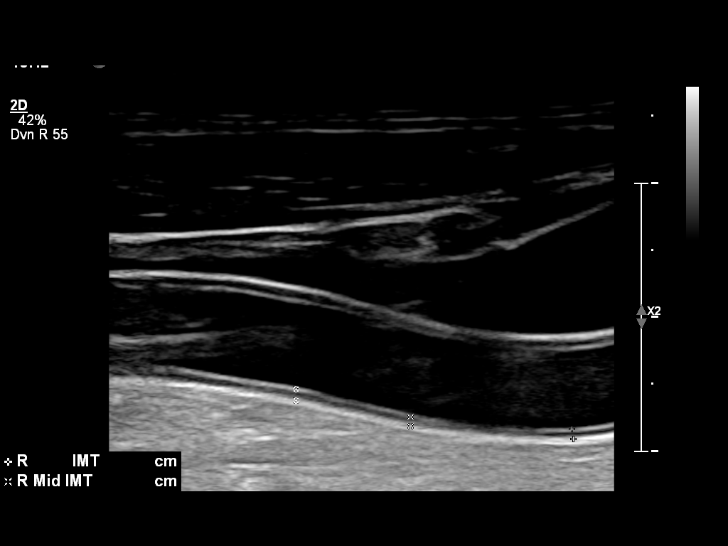
[im 7/75]
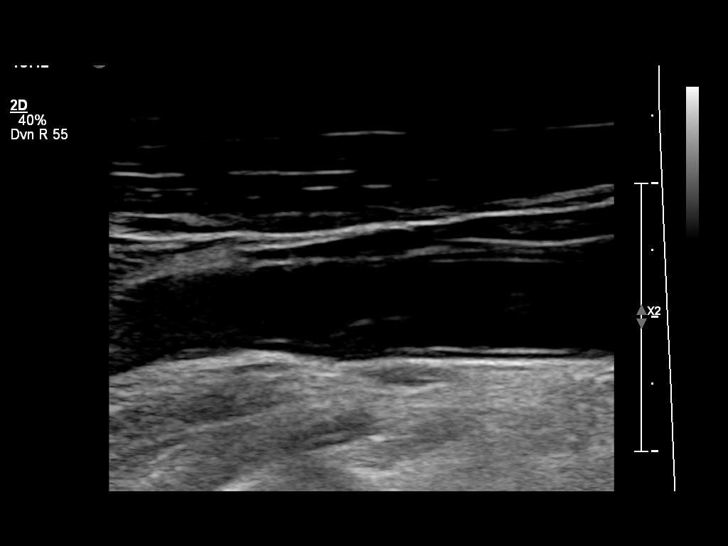
[im 13/75]
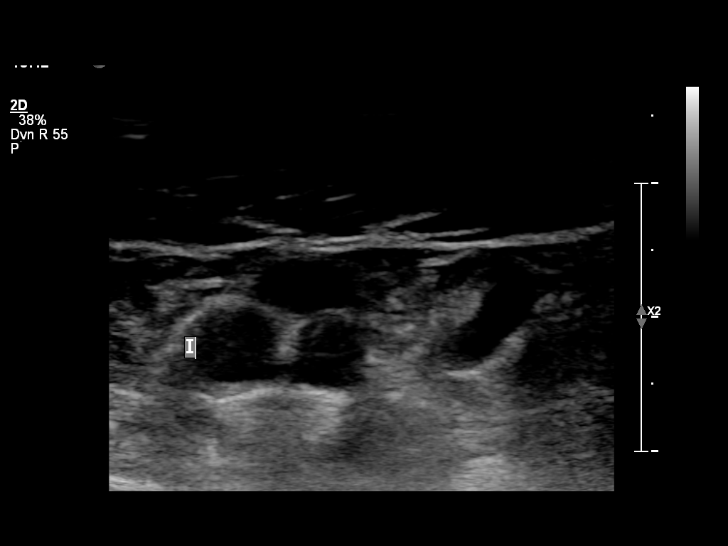
[im 20/75]
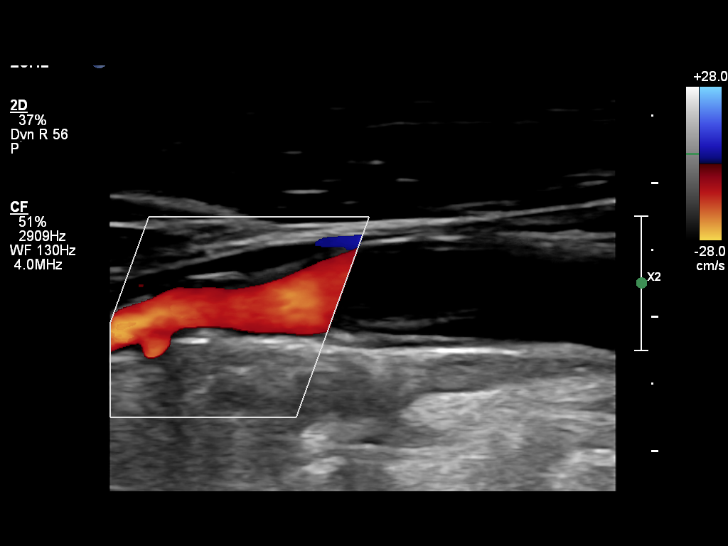
[im 23/75]
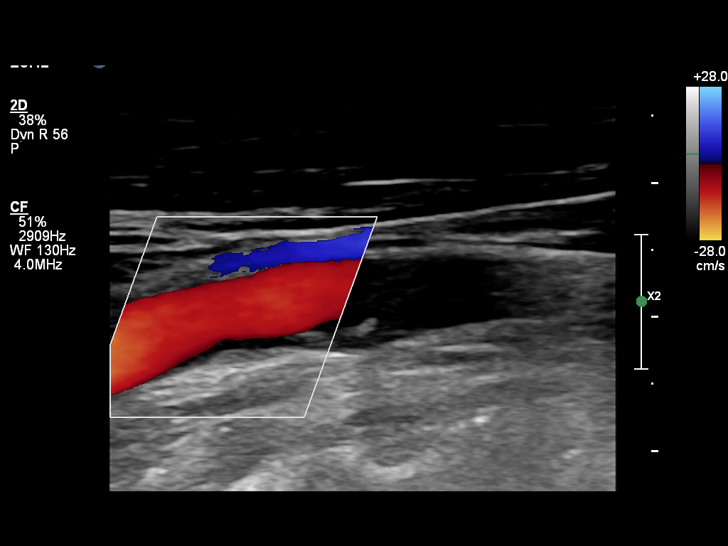
[im 29/75]
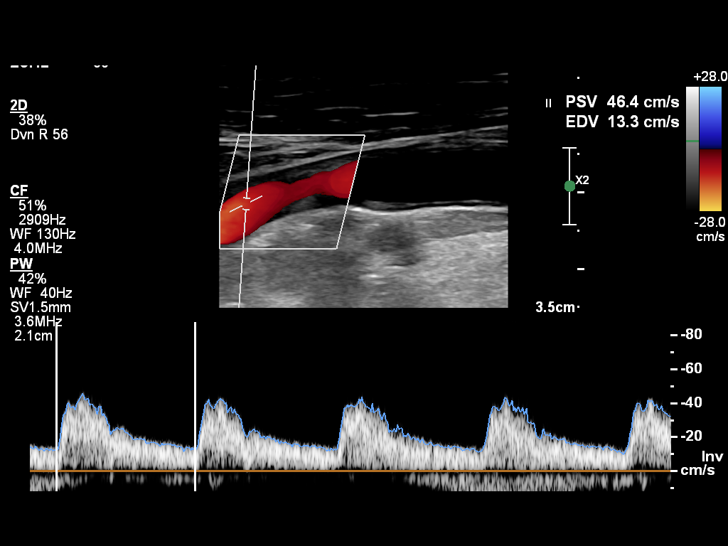
[im 36/75]
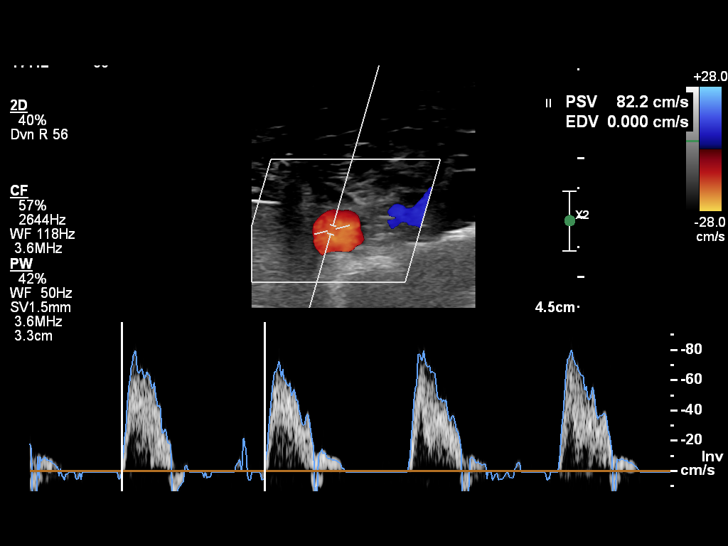
[im 39/75]
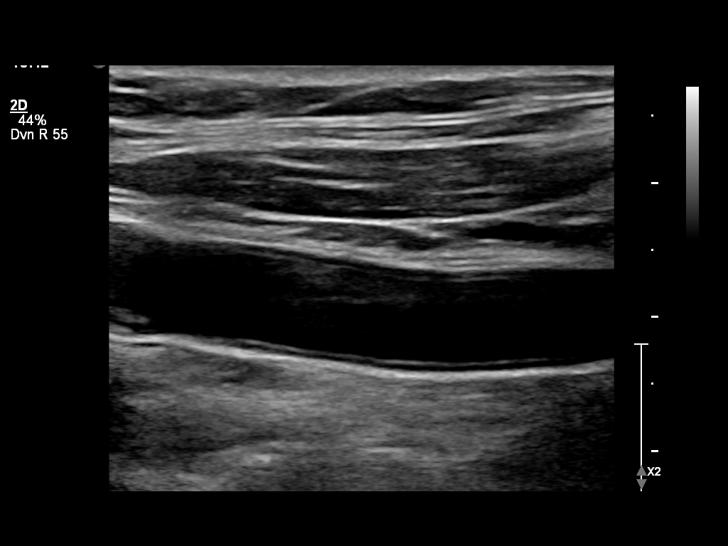
[im 46/75]
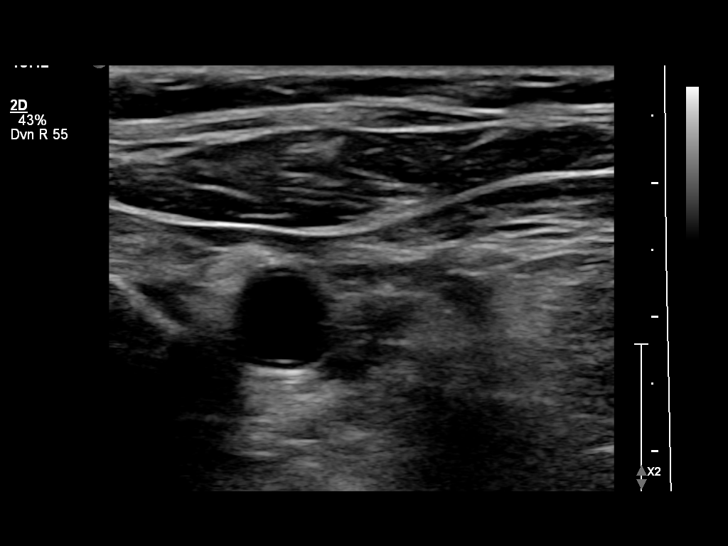
[im 52/75]
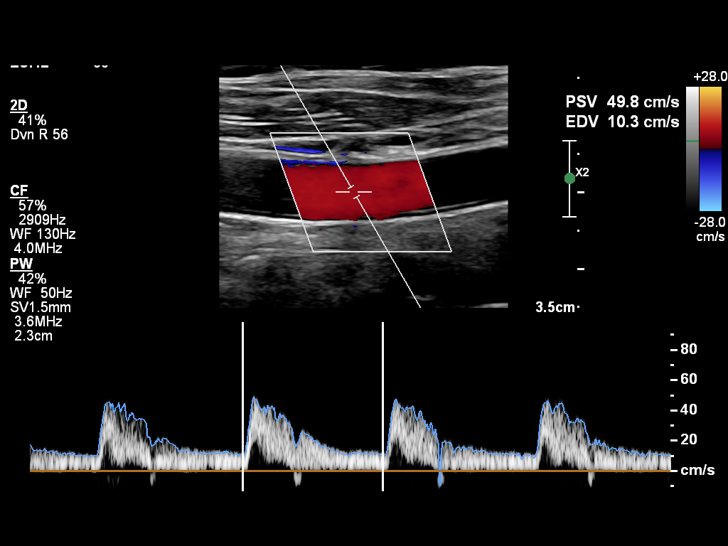
[im 58/75]
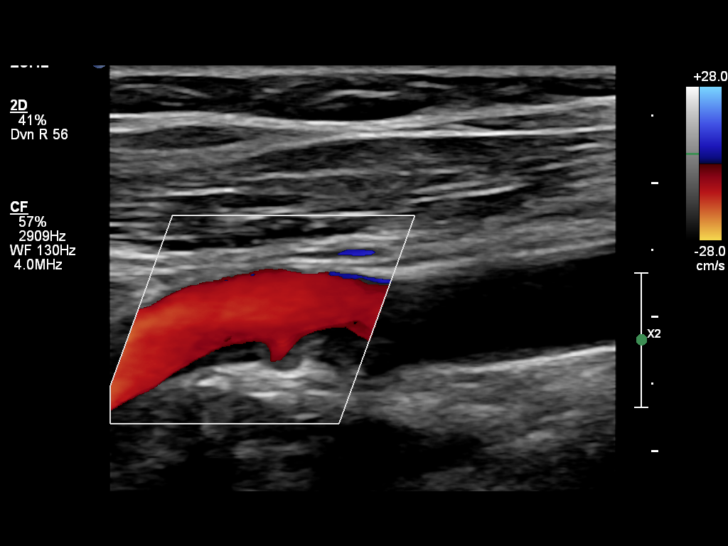
[im 62/75]
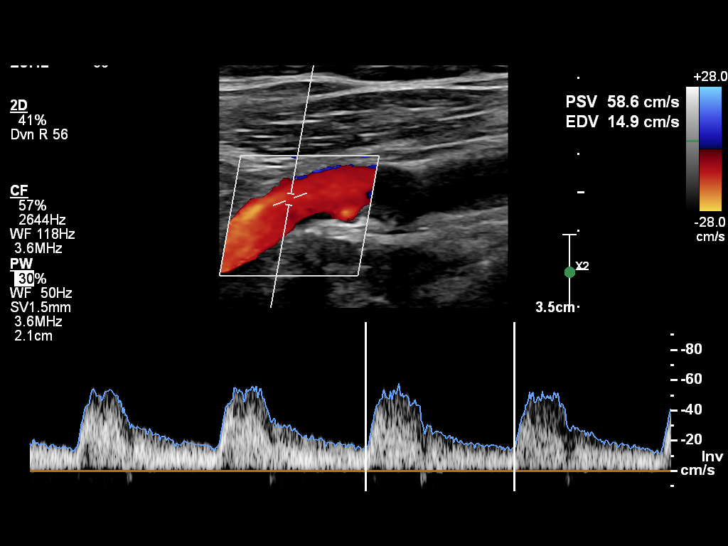
[im 68/75]
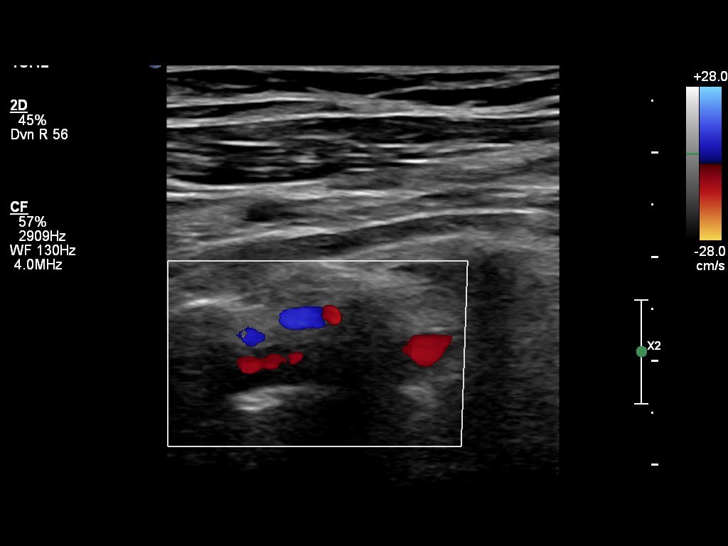
[im 75/75]
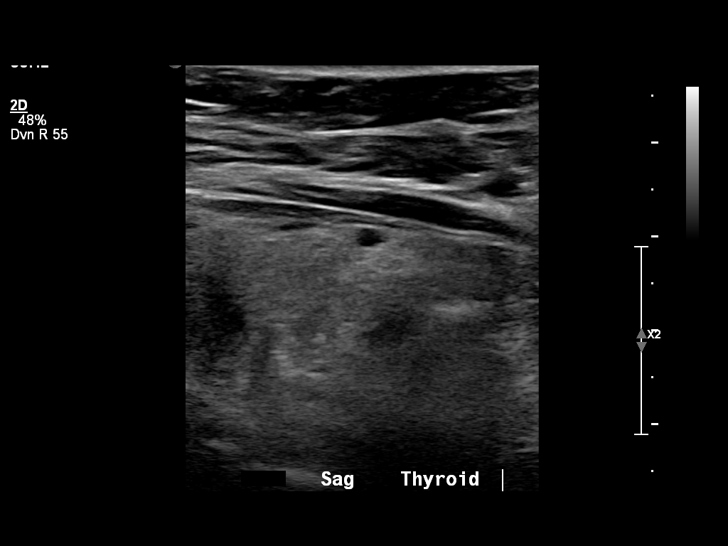

[14 of 24 positions shown; findings below may reference images not displayed]

FINDINGS: Right CCA

57

Left CCA

60

PSV

IC/CC

EDV

Right Internal

75

1.32

12

Left Internal

81

1.35

21

Right Vertebral

Antegrade

Left Vertebral

Antegrade

There is minimal heterogeneous atherosclerotic plaque within the carotid bulbs bilaterally. No hemodynamically significant stenosis or abnormal elevation of velocity is seen at any level. Both vertebral arteries are patent with appropriate antegrade direction of flow.
IMPRESSION: No hemodynamically significant stenosis within the internal carotid arteries as per NASCET criteria.

## 2022-06-03 ENCOUNTER — Encounter (RURAL_HEALTH_CENTER): Payer: Self-pay | Admitting: Family Medicine

## 2022-06-03 ENCOUNTER — Ambulatory Visit (RURAL_HEALTH_CENTER): Payer: Medicare Other | Attending: Family Medicine | Admitting: Family Medicine

## 2022-06-03 ENCOUNTER — Other Ambulatory Visit: Payer: Self-pay

## 2022-06-03 VITALS — BP 130/84 | HR 78 | Temp 97.8°F | Wt 200.0 lb

## 2022-06-03 DIAGNOSIS — L84 Corns and callosities: Secondary | ICD-10-CM | POA: Insufficient documentation

## 2022-06-03 DIAGNOSIS — Z7984 Long term (current) use of oral hypoglycemic drugs: Secondary | ICD-10-CM | POA: Insufficient documentation

## 2022-06-03 DIAGNOSIS — E1165 Type 2 diabetes mellitus with hyperglycemia: Secondary | ICD-10-CM | POA: Insufficient documentation

## 2022-06-03 NOTE — Progress Notes (Signed)
FAMILY MEDICINE, [facility] FAMILY MEDICINE [facility]  [address]  [place] [address]  Operated by [facility]     Name: Grace Hoffman MRN:  [id number]   Date of Birth: 1951/10/02 Age: 71 y.o.   Date: 06/03/2022  Time: 11:50     Provider: Weyman Pedro, DO    Assessment/Plan:    1. Inadequately controlled diabetes mellitus (CMS HCC)  Last HgbA1c was 8. Continue follow up with PCP for optimal glucose management    2. Pre-ulcerative calluses  The patient would benefit from diabetic shoes. She has had previous surgery on her achilles tendon and would best be served by custom shoes.         Reason for visit: Diabetes Follow up (Diabetic shoe evaluation)      History of Present Illness:  Grace Hoffman is a 71 y.o. female with diabetes type two presents in follow up for a diabetic foot exam.    Last hgbA1c was 8. Monitored by PAC Amy Alvis and takes Glucophage bid with glimepiride. Previously better controlled with hgbA1c 7.3 in February and 6.9% in November.    Historical Data    Past Medical History:  Past Medical History:   Diagnosis Date   . Anxiety    . Chronic back pain    . COVID-19 vaccine series completed    . Depression    . Diabetes mellitus, type 2 (CMS HCC)    . Fibromyalgia affecting multiple sites    . Hypertension    . Hypomagnesemia    . Insomnia disorder    . Migraine    . Mixed hyperlipidemia    . Orthostatic hypotension    . Osteoarthritis    . Unspecified glaucoma(365.9)    . Vitamin D deficiency          Past Surgical History:  Past Surgical History:   Procedure Laterality Date   . HX CHOLECYSTECTOMY     . HX KNEE REPLACMENT Left    . HX ROTATOR CUFF REPAIR Left    . SKIN CANCER EXCISION      forehead removed         Allergies:  Allergies   Allergen Reactions   . Codeine Nausea/ Vomiting   . Amoxicillin Diarrhea   . Macrobid [Nitrofurantoin Monohyd/M-Cryst] Nausea/ Vomiting     Medications:  d-mannose 500 mg Oral Capsule, Take 1 Capsule (500 mg total) by  mouth Twice daily  diazePAM (VALIUM) 5 mg Oral Tablet, Take 1 Tablet (5 mg total) by mouth Once a day  enalapril (VASOTEC) 5 mg Oral Tablet, Take 1 Tablet (5 mg total) by mouth Once a day  ergocalciferol, vitamin D2, (DRISDOL) 1,250 mcg (50,000 unit) Oral Capsule, Take 1 Capsule (50,000 Units total) by mouth Every 7 days  famotidine (PEPCID) 20 mg Oral Tablet, TAKE 1 TABLET BY MOUTH TWICE DAILY  glimepiride (AMARYL) 4 mg Oral Tablet, TAKE 1 TABLET(4 MG) BY MOUTH TWICE DAILY WITH A MEAL FOR DIABETES  HYDROcodone-acetaminophen (NORCO) 7.5-325 mg Oral Tablet, Take 1 Tablet by mouth Every 6 hours as needed for up to 30 days Indications: pain  latanoprost (XALATAN) 0.005 % Ophthalmic Drops, Administer 1 Drop into the left eye Every night  Levetiracetam 500 mg Oral Tablet Sustained Release 24 hr, Take 1 Tablet (500 mg total) by mouth Every night for 90 days  magnesium oxide (MAG-OX) 400 mg Oral Tablet, Take 2 Tablets (800 mg total) by mouth Once a day  MetFORMIN (GLUCOPHAGE) 1,000 mg Oral  Tablet, Take 1 Tablet (1,000 mg total) by mouth Twice daily with food  rosuvastatin (CRESTOR) 10 mg Oral Tablet, TAKE 1 TABLET BY MOUTH EVERY NIGHT AT BEDTIME  rosuvastatin (CRESTOR) 20 mg Oral Tablet, Take 1 Tablet (20 mg total) by mouth Every evening (Patient not taking: Reported on 06/03/2022)  SUMAtriptan (IMITREX) 50 mg Oral Tablet, sumatriptan 50 mg tablet   TAKE 1 TABLET BY MOUTH AT ONSET OF HEADACHE. IF NO RELIEF MAY REPEAT 1 AFTER AT LEAST 2 HOURS. MAX OF 4 TABS PER 24 HOURS  venlafaxine (EFFEXOR XR) 37.5 mg Oral Capsule, Sust. Release 24 hr, Take 1 Capsule (37.5 mg total) by mouth Every night    No facility-administered medications prior to visit.     Family History:  Family Medical History:     Problem Relation (Age of Onset)    Kidney failure Mother    No Known Problems Sister, Brother, Half-Sister, Half-Brother, Maternal Aunt, Maternal Uncle, Paternal Aunt, Paternal Uncle, Maternal Grandmother, Maternal Grandfather, Paternal  Grandmother, Paternal Grandfather, Daughter, Son    Respiratory Problems Father          Social History:  Social History     Socioeconomic History   . Marital status: Married   . Number of children: 1   . Years of education: 65   Tobacco Use   . Smoking status: Never   . Smokeless tobacco: Never   Vaping Use   . Vaping Use: Never used   Substance and Sexual Activity   . Alcohol use: Never   . Drug use: Never   . Sexual activity: Not Currently     Partners: Male           Review of Systems:  Any pertinent Review of Systems as addressed in the HPI above.    Physical Exam:  Vital Signs:  Vitals:    06/03/22 1116   BP: 130/84   Pulse: 78   Temp: 36.6 C (97.8 F)   TempSrc: Tympanic   SpO2: 97%   Weight: 90.7 kg (200 lb)     Physical Exam  Cardiovascular:      Pulses:           Dorsalis pedis pulses are 2+ on the right side and 1+ on the left side.        Posterior tibial pulses are 1+ on the right side and 1+ on the left side.   Musculoskeletal:      Right foot: Deformity present.      Left foot: Deformity present.        Feet:    Feet:      Right foot:      Protective Sensation: 6 sites tested. 5 sites sensed.      Skin integrity: Callus and dry skin present.      Toenail Condition: Right toenails are abnormally thick.      Left foot:      Protective Sensation: 6 sites tested. 4 sites sensed.      Skin integrity: Callus and dry skin present.      Toenail Condition: Left toenails are abnormally thick.      Comments: Pre ulcerative calluses great toe and MTP joint                 Weyman Pedro, DO     Portions of this note may be dictated using voice recognition software or a dictation service. Variances in spelling and vocabulary are possible and unintentional. Not all errors are caught/corrected. Please  notify the [facility] if any discrepancies are noted or if the meaning of any statement is not clear.

## 2022-06-12 ENCOUNTER — Other Ambulatory Visit: Payer: Self-pay

## 2022-06-12 ENCOUNTER — Encounter (INDEPENDENT_AMBULATORY_CARE_PROVIDER_SITE_OTHER): Payer: Self-pay | Admitting: Physician Assistant

## 2022-06-12 ENCOUNTER — Ambulatory Visit: Payer: Medicare Other | Attending: Physician Assistant | Admitting: Physician Assistant

## 2022-06-12 ENCOUNTER — Other Ambulatory Visit (INDEPENDENT_AMBULATORY_CARE_PROVIDER_SITE_OTHER): Payer: Self-pay | Admitting: Physician Assistant

## 2022-06-12 DIAGNOSIS — M1990 Unspecified osteoarthritis, unspecified site: Secondary | ICD-10-CM

## 2022-06-12 DIAGNOSIS — Z8673 Personal history of transient ischemic attack (TIA), and cerebral infarction without residual deficits: Secondary | ICD-10-CM

## 2022-06-12 DIAGNOSIS — M797 Fibromyalgia: Secondary | ICD-10-CM

## 2022-06-12 DIAGNOSIS — F32A Depression, unspecified: Secondary | ICD-10-CM | POA: Insufficient documentation

## 2022-06-12 DIAGNOSIS — G459 Transient cerebral ischemic attack, unspecified: Secondary | ICD-10-CM | POA: Insufficient documentation

## 2022-06-12 DIAGNOSIS — R42 Dizziness and giddiness: Secondary | ICD-10-CM

## 2022-06-12 DIAGNOSIS — G894 Chronic pain syndrome: Secondary | ICD-10-CM | POA: Insufficient documentation

## 2022-06-12 DIAGNOSIS — F419 Anxiety disorder, unspecified: Secondary | ICD-10-CM

## 2022-06-12 DIAGNOSIS — I10 Essential (primary) hypertension: Secondary | ICD-10-CM | POA: Insufficient documentation

## 2022-06-12 DIAGNOSIS — Z7985 Long-term (current) use of injectable non-insulin antidiabetic drugs: Secondary | ICD-10-CM

## 2022-06-12 DIAGNOSIS — R600 Localized edema: Secondary | ICD-10-CM

## 2022-06-12 DIAGNOSIS — E1165 Type 2 diabetes mellitus with hyperglycemia: Secondary | ICD-10-CM

## 2022-06-12 MED ORDER — HYDROCODONE 7.5 MG-ACETAMINOPHEN 325 MG TABLET
1.0000 | ORAL_TABLET | Freq: Four times a day (QID) | ORAL | 0 refills | Status: DC | PRN
Start: 2022-06-12 — End: 2022-07-16

## 2022-06-12 MED ORDER — HYDROCHLOROTHIAZIDE 25 MG TABLET
25.0000 mg | ORAL_TABLET | Freq: Every day | ORAL | 3 refills | Status: DC | PRN
Start: 2022-06-12 — End: 2022-12-24

## 2022-06-12 MED ORDER — DIAZEPAM 5 MG TABLET
5.0000 mg | ORAL_TABLET | Freq: Every day | ORAL | 0 refills | Status: DC
Start: 2022-06-12 — End: 2022-07-16

## 2022-06-12 NOTE — Progress Notes (Signed)
INTERNAL MEDICINE, [facility]  [address]  [place] [address]    Telephone Visit    Name:  Grace Hoffman MRN: [id number]   Date:  06/12/2022 Age:   71 y.o.     The patient/family initiated a request for telephone service.  Verbal consent for this service was obtained from the patient/family.    TELEMEDICINE DOCUMENTATION:    Patient Location: [facility]  Patient/family aware of provider location:  yes  Patient/family consent for telemedicine:  yes  Examination observed and performed by:  Eyvonne Mechanic, PA-C        Reason for call: f/u med refills; leg swelling continue dizziness    Call notes:    Grace Hoffman is a 71 y.o. female who is being seen today via telemed for f/u concerning blood pressure/HTN.  Previously patient BP low with drops noted at 1 staining this medication was adjusted.  She was taking enalapril with hydrochlorothiazide daily which I DC and put her on plain enalapril 5 mg daily.  Patient states that her blood pressure has been doing excellent currently 110/80 but she has noticed her legs swelling a little bit more lately so not sure she needs a fluid pill back.  She denies any shortness of breath dyspnea or smothering in supine position.  Currently she has no chest pain headaches.  She also reports to me continued issues with her dizziness recently evaluated by her neurologist Dr. Aretha Parrot.  Patient had an MRI obtained which she states showed too many strokes.  Patient states that he gave her meclizine to use otherwise no new medications or treatment was recommended.  She has been to ENT concerning the dizziness is well and had her crystalline which helped a little bit.  She had no headaches currently noted but does feel weak and at times gait is a little bit off.  She is still using a cane for stability in he also gave her prescription for physical therapy which she has not started yet.  Patient states right now she does not have a follow-up with him but will call to get one.    F/U BS w history  of uncontrolled diabetes with recent hemoglobin A1c 7.3. Patient is on metformin at 1000 mg twice daily along with glimepiride 4 mg twice daily. BS this am was a little low at 71 but she states she has not eaten anything yet.  previously she was unable to tolerate being injectables such as Ozempic and Mounjaro.    Follow-up chronic pain/DJD/fibromyalgia for which she has been on chronic pain therapy/narcotics for numerous years; pain is stable at this current time on dosing no abuse history is noted; patient also compliant w UDS; patient states that she had a MRI on her back and she states that she took it to Dr. Vear Clock and Dr. Vear Clock thinks that she has a bulging disc in her thoracic region; requesting medication refill.    Follow-up chronic depression/anxiety with patient currently on Valium twice a day as needed for anxiety as well as Effexor xray 37.5mg  daily. Currently denies any panic attacks suicidal thoughts ideation and states other then being tired all the time her depression Lethea Killings is "ok". Requests valium refills.    History of glaucoma; continues to follow with ophthalmologist      Current Meds:     . d-mannose 500 mg Oral Capsule Take 1 Capsule (500 mg total) by mouth Twice daily   . diazePAM (VALIUM) 5 mg Oral Tablet Take 1  Tablet (5 mg total) by mouth Once a day   . enalapril (VASOTEC) 5 mg Oral Tablet Take 1 Tablet (5 mg total) by mouth Once a day   . ergocalciferol, vitamin D2, (DRISDOL) 1,250 mcg (50,000 unit) Oral Capsule Take 1 Capsule (50,000 Units total) by mouth Every 7 days   . famotidine (PEPCID) 20 mg Oral Tablet TAKE 1 TABLET BY MOUTH TWICE DAILY   . glimepiride (AMARYL) 4 mg Oral Tablet TAKE 1 TABLET(4 MG) BY MOUTH TWICE DAILY WITH A MEAL FOR DIABETES   . hydroCHLOROthiazide (HYDRODIURIL) 25 mg Oral Tablet Take 1 Tablet (25 mg total) by mouth Once per day as needed   . HYDROcodone-acetaminophen (NORCO) 7.5-325 mg Oral Tablet Take 1 Tablet by mouth Every 6 hours as needed for up to  30 days Indications: pain   . latanoprost (XALATAN) 0.005 % Ophthalmic Drops Administer 1 Drop into the left eye Every night   . Levetiracetam 500 mg Oral Tablet Sustained Release 24 hr Take 1 Tablet (500 mg total) by mouth Every night for 90 days   . magnesium oxide (MAG-OX) 400 mg Oral Tablet Take 2 Tablets (800 mg total) by mouth Once a day   . MetFORMIN (GLUCOPHAGE) 1,000 mg Oral Tablet Take 1 Tablet (1,000 mg total) by mouth Twice daily with food   . rosuvastatin (CRESTOR) 10 mg Oral Tablet TAKE 1 TABLET BY MOUTH EVERY NIGHT AT BEDTIME   . SUMAtriptan (IMITREX) 50 mg Oral Tablet sumatriptan 50 mg tablet   TAKE 1 TABLET BY MOUTH AT ONSET OF HEADACHE. IF NO RELIEF MAY REPEAT 1 AFTER AT LEAST 2 HOURS. MAX OF 4 TABS PER 24 HOURS   . venlafaxine (EFFEXOR XR) 37.5 mg Oral Capsule, Sust. Release 24 hr Take 1 Capsule (37.5 mg total) by mouth Every night        Allergies   Allergen Reactions   . Codeine Nausea/ Vomiting   . Amoxicillin Diarrhea   . Macrobid [Nitrofurantoin Monohyd/M-Cryst] Nausea/ Vomiting        Past Medical History:   Diagnosis Date   . Anxiety    . Chronic back pain    . COVID-19 vaccine series completed    . Depression    . Diabetes mellitus, type 2 (CMS HCC)    . Fibromyalgia affecting multiple sites    . Hypertension    . Hypomagnesemia    . Insomnia disorder    . Migraine    . Mixed hyperlipidemia    . Orthostatic hypotension    . Osteoarthritis    . Unspecified glaucoma(365.9)    . Vitamin D deficiency         Social History     Socioeconomic History   . Marital status: Married   . Number of children: 1   . Years of education: 72   Tobacco Use   . Smoking status: Never   . Smokeless tobacco: Never   Vaping Use   . Vaping Use: Never used   Substance and Sexual Activity   . Alcohol use: Never   . Drug use: Never   . Sexual activity: Not Currently     Partners: Male        Past Surgical History:   Procedure Laterality Date   . HX CHOLECYSTECTOMY     . HX KNEE REPLACMENT Left    . HX ROTATOR CUFF  REPAIR Left    . SKIN CANCER EXCISION      forehead removed  Family Medical History:     Problem Relation (Age of Onset)    Kidney failure Mother    No Known Problems Sister, Brother, Half-Sister, Half-Brother, Maternal Aunt, Maternal Uncle, Paternal Aunt, Paternal Uncle, Maternal Grandmother, Maternal Grandfather, Paternal Grandmother, Paternal Grandfather, Daughter, Son    Respiratory Problems Father             Physical Exam:    Physical Exam  Constitutional:       General: She is not in acute distress.  Pulmonary:      Effort: Pulmonary effort is normal.      Comments: Pt able to speak in complete sentences    Neurological:      Mental Status: She is alert.   Psychiatric:         Mood and Affect: Mood normal.         Behavior: Behavior normal.         Thought Content: Thought content normal.         Judgment: Judgment normal.       Assessment & plan:      ICD-10-CM    1. Primary hypertension  I10       2. Leg edema  R60.0       3. Dizziness  R42     SEEING NEUROLOGIST      4. Mini stroke  G45.9       5. Uncontrolled type 2 diabetes mellitus with hyperglycemia (CMS HCC)  E11.65       6. Chronic pain syndrome  G89.4       7. Osteoarthritis, unspecified osteoarthritis type, unspecified site  M19.90       8. Depression, unspecified depression type  F32.A       9. Anxiety  F41.9           Plan    HCTZ 25 mg daily prn leg edema   Avoid excess NA in diet  Elevate legs while sitting /compression hose discussed  Meds refilled as noted  Continue f/u w neurologist/ PT/ENT as scheduled  F/u 1 month    Total provider time spent with the patient on the phone: 35 minutes.    Trany Chernick, PA-C

## 2022-06-12 NOTE — Telephone Encounter (Signed)
Patient completed a tele med today for her routine 1 month follow up and needed medication refills.

## 2022-06-16 ENCOUNTER — Encounter (RURAL_HEALTH_CENTER): Payer: Self-pay | Admitting: Physician Assistant

## 2022-06-16 DIAGNOSIS — N390 Urinary tract infection, site not specified: Secondary | ICD-10-CM | POA: Insufficient documentation

## 2022-07-10 ENCOUNTER — Ambulatory Visit (RURAL_HEALTH_CENTER): Payer: Self-pay | Admitting: Physician Assistant

## 2022-07-16 ENCOUNTER — Encounter (INDEPENDENT_AMBULATORY_CARE_PROVIDER_SITE_OTHER): Payer: Self-pay | Admitting: Physician Assistant

## 2022-07-16 ENCOUNTER — Other Ambulatory Visit: Payer: Self-pay

## 2022-07-16 ENCOUNTER — Ambulatory Visit: Payer: Medicare Other | Attending: Physician Assistant | Admitting: Physician Assistant

## 2022-07-16 ENCOUNTER — Other Ambulatory Visit (INDEPENDENT_AMBULATORY_CARE_PROVIDER_SITE_OTHER): Payer: Self-pay | Admitting: Physician Assistant

## 2022-07-16 VITALS — BP 144/72 | HR 74 | Temp 97.3°F | Ht 66.0 in | Wt 198.0 lb

## 2022-07-16 DIAGNOSIS — R5383 Other fatigue: Secondary | ICD-10-CM | POA: Insufficient documentation

## 2022-07-16 DIAGNOSIS — N390 Urinary tract infection, site not specified: Secondary | ICD-10-CM | POA: Insufficient documentation

## 2022-07-16 DIAGNOSIS — R32 Unspecified urinary incontinence: Secondary | ICD-10-CM | POA: Insufficient documentation

## 2022-07-16 DIAGNOSIS — R42 Dizziness and giddiness: Secondary | ICD-10-CM | POA: Insufficient documentation

## 2022-07-16 DIAGNOSIS — G894 Chronic pain syndrome: Secondary | ICD-10-CM | POA: Insufficient documentation

## 2022-07-16 DIAGNOSIS — I10 Essential (primary) hypertension: Secondary | ICD-10-CM | POA: Insufficient documentation

## 2022-07-16 DIAGNOSIS — E1165 Type 2 diabetes mellitus with hyperglycemia: Secondary | ICD-10-CM | POA: Insufficient documentation

## 2022-07-16 DIAGNOSIS — E538 Deficiency of other specified B group vitamins: Secondary | ICD-10-CM | POA: Insufficient documentation

## 2022-07-16 DIAGNOSIS — M791 Myalgia, unspecified site: Secondary | ICD-10-CM | POA: Insufficient documentation

## 2022-07-16 LAB — COMPREHENSIVE METABOLIC PNL, FASTING
ALBUMIN/GLOBULIN RATIO: 1.5 — ABNORMAL HIGH (ref 0.8–1.4)
ALBUMIN: 4.1 g/dL (ref 3.5–5.7)
ALKALINE PHOSPHATASE: 66 U/L (ref 34–104)
ALT (SGPT): 9 U/L (ref 7–52)
ANION GAP: 5 mmol/L — ABNORMAL LOW (ref 10–20)
AST (SGOT): 13 U/L (ref 13–39)
BILIRUBIN TOTAL: 0.3 mg/dL (ref 0.3–1.2)
BUN/CREA RATIO: 13 (ref 6–22)
BUN: 12 mg/dL (ref 7–25)
CALCIUM, CORRECTED: 9.7 mg/dL (ref 8.9–10.8)
CALCIUM: 9.8 mg/dL (ref 8.6–10.3)
CHLORIDE: 105 mmol/L (ref 98–107)
CO2 TOTAL: 30 mmol/L (ref 21–31)
CREATININE: 0.92 mg/dL (ref 0.60–1.30)
ESTIMATED GFR: 66 mL/min/{1.73_m2} (ref >59)
GLOBULIN: 2.7 — ABNORMAL LOW (ref 2.9–5.4)
GLUCOSE: 149 mg/dL — ABNORMAL HIGH (ref 74–109)
OSMOLALITY, CALCULATED: 282 mOsm/kg (ref 270–290)
POTASSIUM: 4.4 mmol/L (ref 3.5–5.1)
PROTEIN TOTAL: 6.8 g/dL (ref 6.4–8.9)
SODIUM: 140 mmol/L (ref 136–145)

## 2022-07-16 LAB — CBC WITH DIFF
BASOPHIL #: 0.1 10*3/uL (ref 0.00–0.30)
BASOPHIL %: 1 % (ref 0–3)
EOSINOPHIL #: 0.2 10*3/uL (ref 0.00–0.80)
EOSINOPHIL %: 4 % (ref 0–7)
HCT: 40.2 % (ref 37.0–47.0)
HGB: 13.2 g/dL (ref 12.5–16.0)
LYMPHOCYTE #: 2.6 10*3/uL (ref 1.10–5.00)
LYMPHOCYTE %: 43 % (ref 25–45)
MCH: 32.4 pg — ABNORMAL HIGH (ref 27.0–32.0)
MCHC: 32.8 g/dL (ref 32.0–36.0)
MCV: 98.7 fL (ref 78.0–99.0)
MONOCYTE #: 0.4 10*3/uL (ref 0.00–1.30)
MONOCYTE %: 7 % (ref 0–12)
MPV: 10.9 fL — ABNORMAL HIGH (ref 7.4–10.4)
NEUTROPHIL #: 2.7 10*3/uL (ref 1.80–8.40)
NEUTROPHIL %: 45 % (ref 40–76)
PLATELETS: 195 10*3/uL (ref 140–440)
RBC: 4.08 10*6/uL — ABNORMAL LOW (ref 4.20–5.40)
RDW: 13.4 % (ref 11.6–14.8)
WBC: 6 10*3/uL (ref 4.0–10.5)
WBCS UNCORRECTED: 6 10*3/uL

## 2022-07-16 LAB — FOLATE: FOLATE: 16.1 ng/mL (ref 5.9–24.4)

## 2022-07-16 LAB — VITAMIN B12: VITAMIN B 12: 915 pg/mL — ABNORMAL HIGH (ref 180–914)

## 2022-07-16 LAB — MAGNESIUM: MAGNESIUM: 1.8 mg/dL — ABNORMAL LOW (ref 1.9–2.7)

## 2022-07-16 LAB — PARATHYROID HORMONE (PTH): PTH: 44.2 pg/mL (ref 12.0–88.0)

## 2022-07-16 LAB — THYROID STIMULATING HORMONE WITH FREE T4 REFLEX: TSH: 1.357 u[IU]/mL (ref 0.450–5.330)

## 2022-07-16 MED ORDER — CYANOCOBALAMIN (VIT B-12) 1,000 MCG/ML INJECTION SOLUTION
1000.0000 ug | Freq: Every day | INTRAMUSCULAR | 0 refills | Status: DC
Start: 2022-07-16 — End: 2022-07-16

## 2022-07-16 MED ORDER — DIAZEPAM 5 MG TABLET
5.0000 mg | ORAL_TABLET | Freq: Every day | ORAL | 0 refills | Status: DC
Start: 2022-07-16 — End: 2022-08-13

## 2022-07-16 MED ORDER — SOLIFENACIN 5 MG TABLET
5.0000 mg | ORAL_TABLET | Freq: Every day | ORAL | 3 refills | Status: DC
Start: 2022-07-16 — End: 2022-07-17

## 2022-07-16 MED ORDER — HYDROCODONE 7.5 MG-ACETAMINOPHEN 325 MG TABLET
1.0000 | ORAL_TABLET | Freq: Four times a day (QID) | ORAL | 0 refills | Status: DC | PRN
Start: 2022-07-16 — End: 2022-08-13

## 2022-07-16 MED ORDER — CIPROFLOXACIN 500 MG TABLET
500.0000 mg | ORAL_TABLET | Freq: Two times a day (BID) | ORAL | 0 refills | Status: DC
Start: 2022-07-16 — End: 2022-08-13

## 2022-07-16 MED ORDER — CYANOCOBALAMIN (VIT B-12) 1,000 MCG/ML INJECTION SOLUTION
1000.0000 ug | Freq: Once | INTRAMUSCULAR | Status: AC
Start: 2022-07-16 — End: 2022-07-16
  Administered 2022-07-16: 1000 ug via INTRAMUSCULAR

## 2022-07-16 NOTE — Progress Notes (Addendum)
INTERNAL MEDICINE, [facility]  [address]  [place] [address]  Operated by [facility]  History and Physical     Name: Grace Hoffman MRN:  [id number]   Date: 07/16/2022 Age: 71 y.o.               Follow Up        Reason for Visit: Follow Up (1 month routine ) urine complaints fatigue dizziness    History of Present Illness  Grace Hoffman is a 71 y.o. female who is being seen today in the office for f/u however multiple complaints noted.  Patient complaining of urinary issues with incontinence increased pressure frequency.  She has a history of recurrent UTIs noted but denies burning with urination blood fever chills.  She has been seen by a urologist in the past several times who retired that is referred to local urologist in [place] but states she went there was not impressed that she will not be going back.  Patient had been taking Mybetric started from her original urologist the states the medication was not covered by her insurance a very extensive.  She is not sure as to whether it even really work that great so would like for me to switch to something else if possible.  She states that family medication she has tried for the incontinence.    She continues to complain of dizziness which has been a chronic complaint of hers for a couple months.  She has been seen by the ENT where she had her crystals realigned.  She states this helped a little bit but she feels like her balance is still an issue.  She also reports a mild tremor which concerns her.  Patient states it is worse at times and mainly noted in her hands.  Patient does see Dr. Aretha Parrot routinely and has had several test all of which were unremarkable.  States the neurologist wants her to have physical therapy but due to her weakness at this current time she does not feel like she is able to do so.  History of migraines also noted for which he treats.  Patient denies a falls or syncope.      Increased fatigue also noted  worse over the past week or so.  Patient has a chronic history of fatigue however noted.  Once again urine issues noted as well as B12 deficiency for which patient is requesting an injection today.    Follow-up blood pressure/hypertension currently 124/72 in the right arm 144/72 in the left.  Patient questioning why blood pressures are different today than she has had a history of this in the past.  We had discussed workup of her carotid and subclavian arteries however she had been seen by the neurologist as well as a cardiologist in the past that she thought had done ultrasound but today tells me she is not really sure is testing has been completed of her neck area and is willing to get ultrasounds thought necessary.  Patient has had issues in the past with low blood blood pressure for which medications were adjusted.  She is currently taking Vasotec 2.5 mg daily as well as hydrochlorothiazide 25 mg daily p.r.n.Marland Kitchen   Denies chest pain.    F/U BS w history of uncontrolled diabetes with hemoglobin A1c 7.3.  Currently patient is on metformin at 1000 mg twice daily along with glimepiride 4 mg twice daily.  Noted she was unable to tolerate being injectables such as Ozempic  and Mounjaro due to GI side effects.  Denies increased thirst or urination.    Follow-up chronic pain/DJD/fibromyalgia for which she has been on chronic pain therapy/narcotics for numerous years; pain is stable at this current time on dosing no abuse history is noted; patient also compliant w UDS; patient states that she had a MRI on her back and she states that she took it to Dr. Vear Clock and Dr. Vear Clock thinks that she has a bulging disc in her thoracic region; requesting medication refill.    PHQ Questionnaire  Little interest or pleasure in doing things.: Not at all  Feeling down, depressed, or hopeless: Not at all  PHQ 2 Total: 0      Past Medical History:   Diagnosis Date    Anxiety     Chronic back pain     COVID-19 vaccine series completed      Depression     Diabetes mellitus, type 2 (CMS HCC)     Fibromyalgia affecting multiple sites     Hypertension     Hypomagnesemia     Insomnia disorder     Migraine     Mixed hyperlipidemia     Orthostatic hypotension     Osteoarthritis     Unspecified glaucoma(365.9)     Vitamin D deficiency          Past Surgical History:   Procedure Laterality Date    HX CHOLECYSTECTOMY      HX KNEE REPLACMENT Left     HX ROTATOR CUFF REPAIR Left     SKIN CANCER EXCISION      forehead removed      Family Medical History:       Problem Relation (Age of Onset)    Kidney failure Mother    No Known Problems Sister, Brother, Half-Sister, Half-Brother, Maternal Aunt, Maternal Uncle, Paternal Aunt, Paternal Uncle, Maternal Grandmother, Maternal Grandfather, Paternal Grandmother, Paternal Grandfather, Daughter, Son    Respiratory Problems Father            Social History     Tobacco Use    Smoking status: Never    Smokeless tobacco: Never   Vaping Use    Vaping Use: Never used   Substance Use Topics    Alcohol use: Never    Drug use: Never     Medication:  d-mannose 500 mg Oral Capsule, Take 1 Capsule (500 mg total) by mouth Twice daily  enalapril (VASOTEC) 5 mg Oral Tablet, TAKE 1 TABLET BY MOUTH EVERY DAY  ergocalciferol, vitamin D2, (DRISDOL) 1,250 mcg (50,000 unit) Oral Capsule, Take 1 Capsule (50,000 Units total) by mouth Every 7 days  famotidine (PEPCID) 20 mg Oral Tablet, TAKE 1 TABLET BY MOUTH TWICE DAILY  glimepiride (AMARYL) 4 mg Oral Tablet, TAKE 1 TABLET(4 MG) BY MOUTH TWICE DAILY WITH A MEAL FOR DIABETES  hydroCHLOROthiazide (HYDRODIURIL) 25 mg Oral Tablet, Take 1 Tablet (25 mg total) by mouth Once per day as needed  HYDROcodone-acetaminophen (NORCO) 7.5-325 mg Oral Tablet, Take 1 Tablet by mouth Every 6 hours as needed for up to 30 days Indications: pain  latanoprost (XALATAN) 0.005 % Ophthalmic Drops, Administer 1 Drop into the left eye Every night  Levetiracetam 500 mg Oral Tablet Sustained Release 24 hr, Take 1 Tablet  (500 mg total) by mouth Every night for 90 days  magnesium oxide (MAG-OX) 400 mg Oral Tablet, Take 2 Tablets (800 mg total) by mouth Once a day  MetFORMIN (GLUCOPHAGE) 1,000 mg Oral Tablet, Take 1 Tablet (1,000  mg total) by mouth Twice daily with food  rosuvastatin (CRESTOR) 10 mg Oral Tablet, TAKE 1 TABLET BY MOUTH EVERY NIGHT AT BEDTIME  SUMAtriptan (IMITREX) 50 mg Oral Tablet, sumatriptan 50 mg tablet   TAKE 1 TABLET BY MOUTH AT ONSET OF HEADACHE. IF NO RELIEF MAY REPEAT 1 AFTER AT LEAST 2 HOURS. MAX OF 4 TABS PER 24 HOURS  venlafaxine (EFFEXOR XR) 37.5 mg Oral Capsule, Sust. Release 24 hr, Take 1 Capsule (37.5 mg total) by mouth Every night  diazePAM (VALIUM) 5 mg Oral Tablet, Take 1 Tablet (5 mg total) by mouth Once a day    No facility-administered medications prior to visit.    Allergies:  Allergies   Allergen Reactions    Codeine Nausea/ Vomiting    Amoxicillin Diarrhea    Macrobid [Nitrofurantoin Monohyd/M-Cryst] Nausea/ Vomiting       Physical Exam:  Vitals:    07/16/22 1444 07/16/22 1445   BP: 124/72 (!) 144/72   Pulse: 74    Temp: 36.3 C (97.3 F)    TempSrc: Tympanic    SpO2: 97%    Weight: 89.8 kg (198 lb)    Height: 1.676 m (5\' 6" )    BMI: 32.02       Physical Exam  Vitals and nursing note reviewed.   Constitutional:       Appearance: Normal appearance. She is obese.   HENT:      Right Ear: Tympanic membrane normal.      Left Ear: Tympanic membrane normal.      Mouth/Throat:      Mouth: Mucous membranes are moist.      Pharynx: Oropharynx is clear.   Eyes:      Extraocular Movements: Extraocular movements intact.      Pupils: Pupils are equal, round, and reactive to light.   Cardiovascular:      Rate and Rhythm: Normal rate and regular rhythm.      Pulses: Normal pulses.   Pulmonary:      Effort: Pulmonary effort is normal.      Breath sounds: Normal breath sounds.   Abdominal:      General: Bowel sounds are normal.      Palpations: Abdomen is soft.      Tenderness: There is abdominal tenderness  (Suprapubic). There is no right CVA tenderness, left CVA tenderness or rebound.   Musculoskeletal:         General: No swelling or tenderness. Normal range of motion.      Cervical back: Normal range of motion and neck supple.      Comments: Arthritic changes hands and knees noted bilateral  Chronic trigger points upper back and thigh area  Chronic low back pain with range of motion  Negative straight leg raises  Gait stable   Skin:     General: Skin is warm.      Findings: No lesion or rash.   Neurological:      General: No focal deficit present.      Mental Status: She is alert and oriented to person, place, and time.      Sensory: No sensory deficit.      Motor: Weakness (Strength of upper and lower extremities decreased however equal bilateral) present.      Gait: Gait normal.   Psychiatric:         Mood and Affect: Mood normal.         Behavior: Behavior normal.         Thought Content:  Thought content normal.         Judgment: Judgment normal.     Urine Dip Results:   Time collected: 1555  Glucose (Ref Range: Negative mg/dL): Negative  Bilirubin (Ref Range: Negative mg/dL): Negative  Ketones (Ref Range: Negative mg/dL): (!) Moderate (40 mg/dl)  Urine Specific Gravity (Ref Range: 1.005 - 1.030): 1.010  Blood (urine) (Ref Range: Negative mg/dL): Negative  pH (Ref Range: 5.0 - 8.0): 6.0  Protein (Ref Range: Negative mg/dL): (!) Trace  Urobilinogen (Ref Range: Negative mg/dL): 0.2mg /dL (Normal)  Nitrite (Ref Range: Negative): Negative  Leukocytes (Ref Range: Negative WBC's/uL): (!) Moderate           Assessment/Plan:  Problem List Items Addressed This Visit          Cardiovascular System    Hypertension    Relevant Orders    CBC/DIFF    COMPREHENSIVE METABOLIC PNL, FASTING       Neurologic    Myalgia    Relevant Orders    PARATHYROID HORMONE (PTH)    Chronic pain    Relevant Orders    PARATHYROID HORMONE (PTH)       Nephrology    Recurrent UTI - Primary    Relevant Orders    URINALYSIS, MACROSCOPIC AND MICROSCOPIC  W/CULTURE REFLEX    URINALYSIS, MACROSCOPIC AND MICROSCOPIC W/CULTURE REFLEX    Urinary incontinence    Relevant Orders    POCT URINE DIPSTICK       Endocrine    Uncontrolled type 2 diabetes mellitus with hyperglycemia (CMS HCC)    Relevant Orders    CBC/DIFF    COMPREHENSIVE METABOLIC PNL, FASTING    HGA1C (HEMOGLOBIN A1C WITH EST AVG GLUCOSE)    URINALYSIS, MACROSCOPIC AND MICROSCOPIC W/CULTURE REFLEX    MICROALBUMIN/CREATININE RATIO, URINE, RANDOM    MAGNESIUM    B12 deficiency    Relevant Medications    cyanocobalamin (VITAMIN B12) 1000 mcg/mL injection (Completed) (Start on 07/16/2022  5:00 PM)    Other Relevant Orders    CBC/DIFF    VITAMIN B12       Other    Dizziness    Relevant Orders    CAROTID ARTERY DUPLEX    Fatigue    Relevant Orders    THYROID STIMULATING HORMONE WITH FREE T4 REFLEX    FOLATE       Labs obtained in office  Urine sent for culture  Cipro 500 b.i.d. times 10 days given  Mybetriq DC Will try VESIcare 5 mg daily; discussed with patient possibility of such medicines causing dizziness along with her Effexor.  Patient willing at this time to stop Effexor to see if symptoms improve  Carotid ultrasound ordered  Will continue follow-up with neurologist/ENT is scheduled   B12 shot given  Encouraged cane for stability  Continue blood pressure/blood sugar monitoring outpatient  Norco 7.5mg  #90 no refills given along with Valium 5 mg daily   Refuses colonoscopy  Follow-up pending lab results otherwise as noted    Follow up:   Post-Discharge Follow Up Appointments       Wednesday Aug 13, 2022    Return Patient Visit with Eyvonne Mechanic, PA-C at  1:00 PM      Thursday Sep 11, 2022    Return Patient Visit with Eyvonne Mechanic, PA-C at  2:30 PM      Tuesday Oct 07, 2022    Return Patient Visit with Arlana Hove, DO at 11:00 AM      Thursday Apr 09, 2023  Return Telephone Visit with Eyvonne Mechanic, PA-C at  1:30 PM      Internal Medicine, Building [facility]  [facility]  [address]  [place]  [address]  [phone] Urology, [facility] [place], [place]  [address]  [place] [address]  [phone]           Seek medical attention for new or worsening symptoms.  Patient has been seen in this clinic within the last 3 years.     Tamitha Norell, PA-C          This note was partially created using MModal Fluency Direct system (voice recognition software) and is inherently subject to errors including those of syntax and "sound-alike" substitutions which may escape proofreading.  In such instances, original meaning may be extrapolated by contextual derivation.

## 2022-07-16 NOTE — Nursing Note (Signed)
07/16/22 1500   Urine test  (Siemens Multistix 10 SG)   Performed Status: Manual   Time collected 1555   Color (Ref Range: Yellow) (!) Dark Yellow   Clarity (Ref Range: Clear) (!) Cloudy   Glucose (Ref Range: Negative mg/dL) Negative   Bilirubin (Ref Range: Negative mg/dL) Negative   Ketones (Ref Range: Negative mg/dL) (!) Moderate (40 mg/dl)   Urine Specific Gravity (Ref Range: 1.005 - 1.030) 1.010   Blood (urine) (Ref Range: Negative mg/dL) Negative   pH (Ref Range: 5.0 - 8.0) 6.0   Protein (Ref Range: Negative mg/dL) (!) Trace   Urobilinogen (Ref Range: Negative mg/dL) 0.2mg /dL (Normal)   Nitrite (Ref Range: Negative) Negative   Leukocytes (Ref Range: Negative WBC's/uL) (!) 2+   Initials KD

## 2022-07-16 NOTE — Nursing Note (Signed)
Patient is here today for routine 1 month follow up. Patient states that she needs medication refills and that she has been having complications with vertigo.

## 2022-07-16 NOTE — Telephone Encounter (Signed)
Patient is in office today for routine follow up needs Norco refilled.

## 2022-07-17 ENCOUNTER — Telehealth (INDEPENDENT_AMBULATORY_CARE_PROVIDER_SITE_OTHER): Payer: Self-pay | Admitting: Physician Assistant

## 2022-07-17 LAB — HGA1C (HEMOGLOBIN A1C WITH EST AVG GLUCOSE): HEMOGLOBIN A1C: 8 % — ABNORMAL HIGH (ref 4.0–6.0)

## 2022-07-18 ENCOUNTER — Other Ambulatory Visit (INDEPENDENT_AMBULATORY_CARE_PROVIDER_SITE_OTHER): Payer: Self-pay | Admitting: Physician Assistant

## 2022-07-18 DIAGNOSIS — R899 Unspecified abnormal finding in specimens from other organs, systems and tissues: Secondary | ICD-10-CM

## 2022-07-21 MED ORDER — TOUJEO MAX U-300 SOLOSTAR 300 UNIT/ML (3 ML) SUBCUTANEOUS INSULIN PEN
10.0000 [IU] | PEN_INJECTOR | Freq: Every evening | SUBCUTANEOUS | 2 refills | Status: DC
Start: 2022-07-21 — End: 2022-10-21

## 2022-07-24 ENCOUNTER — Telehealth (INDEPENDENT_AMBULATORY_CARE_PROVIDER_SITE_OTHER): Payer: Self-pay | Admitting: Physician Assistant

## 2022-07-24 ENCOUNTER — Other Ambulatory Visit (INDEPENDENT_AMBULATORY_CARE_PROVIDER_SITE_OTHER): Payer: Self-pay | Admitting: Physician Assistant

## 2022-07-24 DIAGNOSIS — R899 Unspecified abnormal finding in specimens from other organs, systems and tissues: Secondary | ICD-10-CM

## 2022-07-24 DIAGNOSIS — R32 Unspecified urinary incontinence: Secondary | ICD-10-CM

## 2022-07-24 DIAGNOSIS — E1165 Type 2 diabetes mellitus with hyperglycemia: Secondary | ICD-10-CM

## 2022-07-24 NOTE — Telephone Encounter (Signed)
I finally talked to patient on the phone about refer to Nephrology. She agreed. I also told her about 24 hour urine test you want. She stated she can't do that test because when she stands up urine fills her pad. So I suggested Urology refer as well for urinary incontinence. Patient agreed . Would you want me to put that refer in as well?

## 2022-07-25 ENCOUNTER — Other Ambulatory Visit (INDEPENDENT_AMBULATORY_CARE_PROVIDER_SITE_OTHER): Payer: Self-pay | Admitting: Physician Assistant

## 2022-07-25 MED ORDER — PEN NEEDLE, DIABETIC 32 GAUGE X 5/32"
1 refills | Status: DC
Start: 2022-07-25 — End: 2022-12-02

## 2022-08-07 ENCOUNTER — Ambulatory Visit (RURAL_HEALTH_CENTER): Payer: Self-pay | Admitting: Physician Assistant

## 2022-08-08 IMAGING — US CAROTID US W/DOPPLER
1 series · 14 of 24 positions shown · non-contrast
Comparison: Previous carotid Doppler exam dated 06/02/2022. MRI brain dated 05/13/2022.

﻿EXAM:  CAROTID US W/DOPPLER
INDICATION: Dizziness.  History of hypertension.  Hyperlipidemia.  Nonsmoker.

[Series 1: carotid us w/doppler · 14 of 81 slices shown]
[im 1/81]
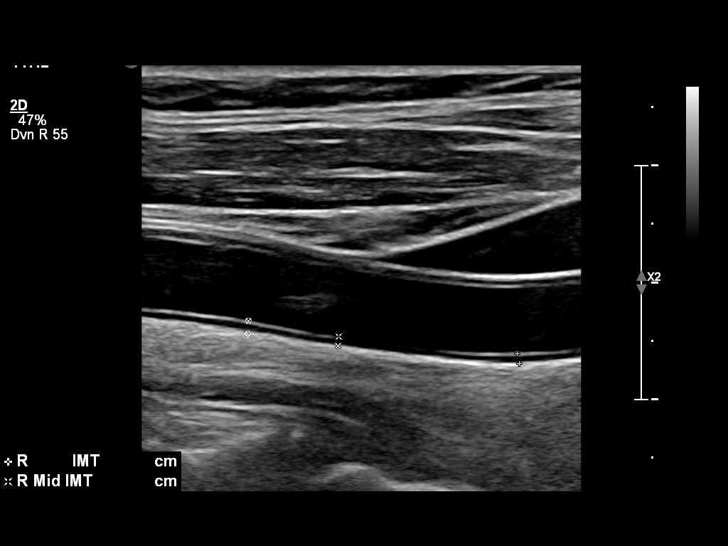
[im 7/81]
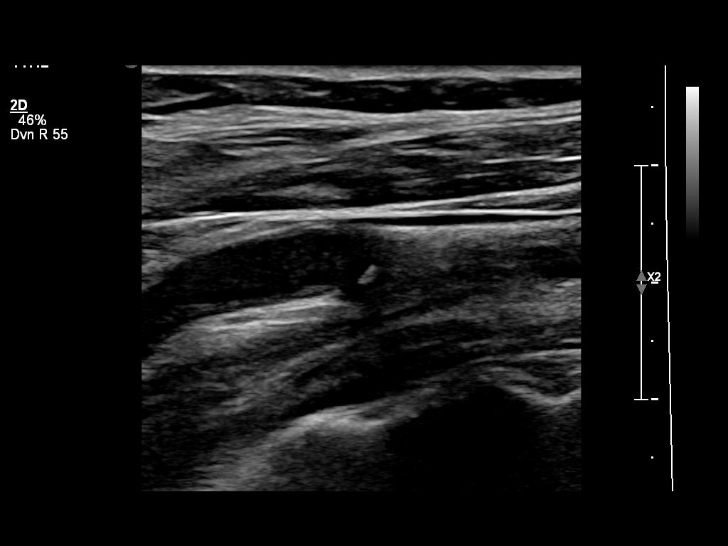
[im 14/81]
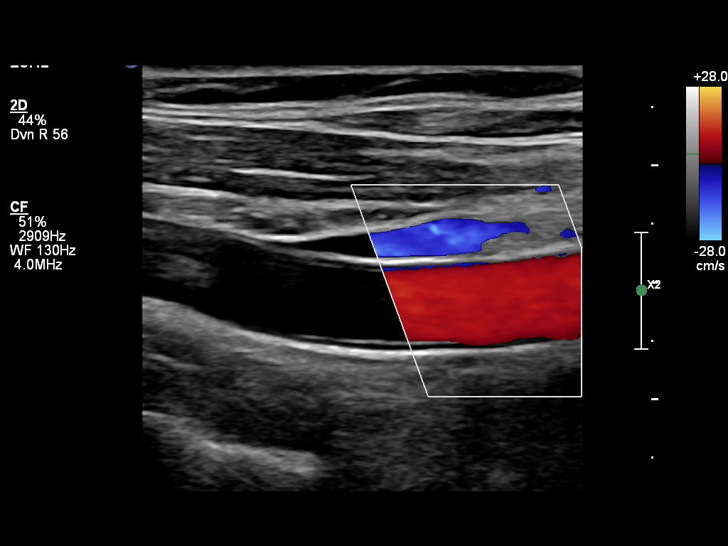
[im 21/81]
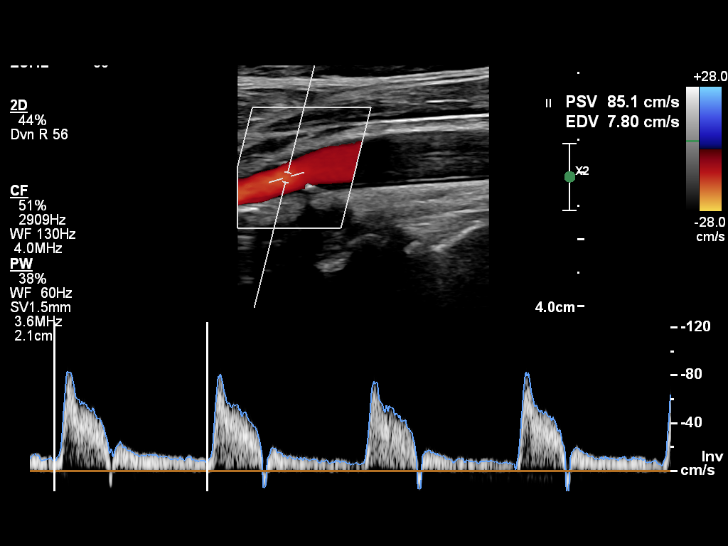
[im 25/81]
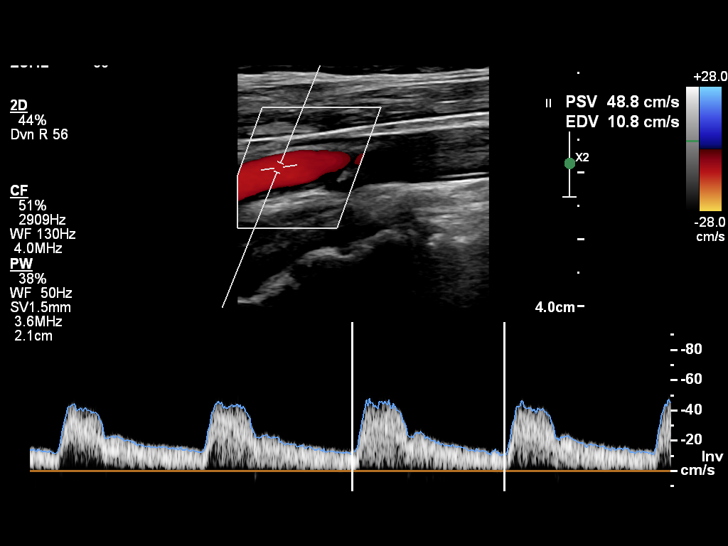
[im 32/81]
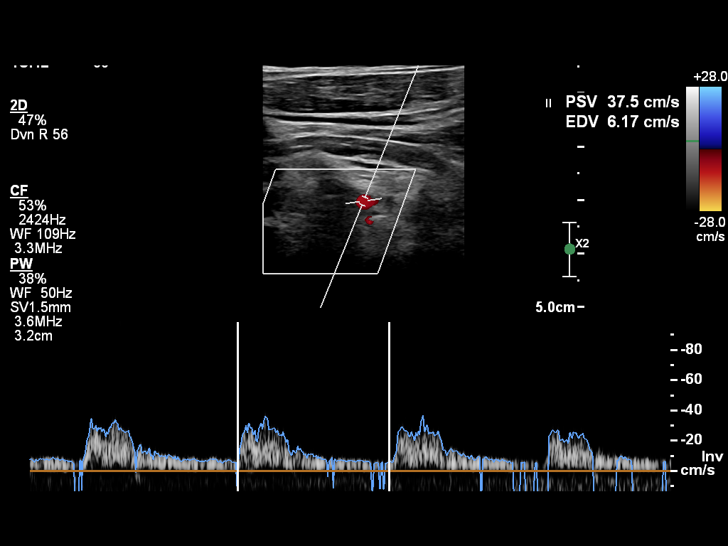
[im 39/81]
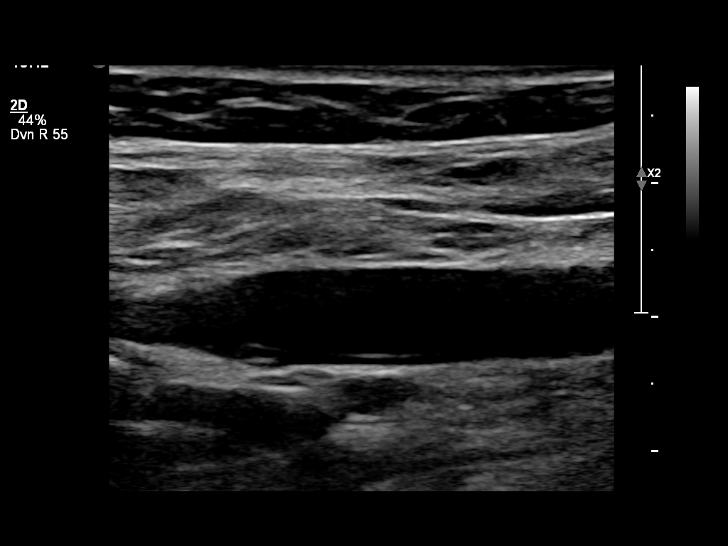
[im 42/81]
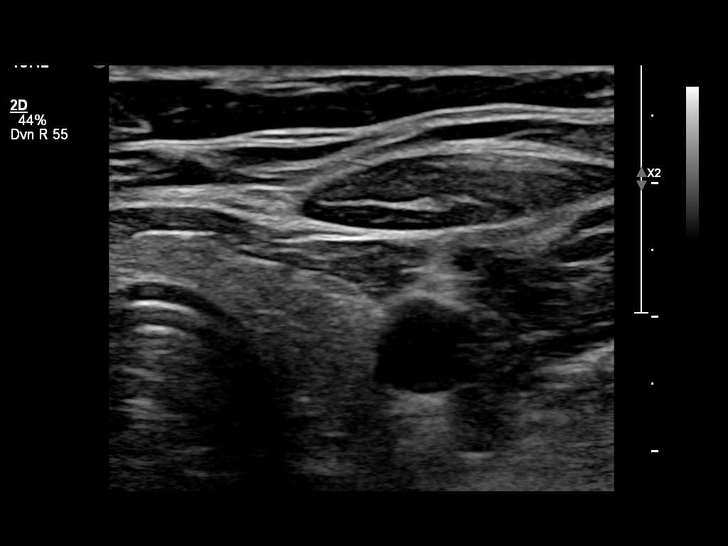
[im 49/81]
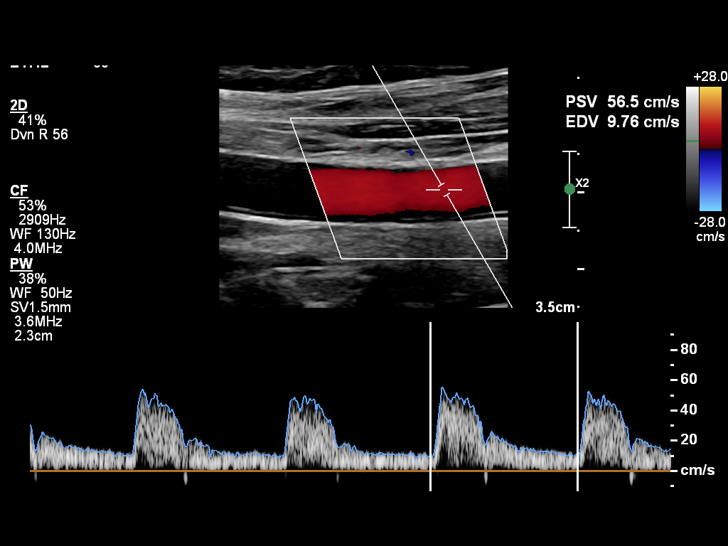
[im 56/81]
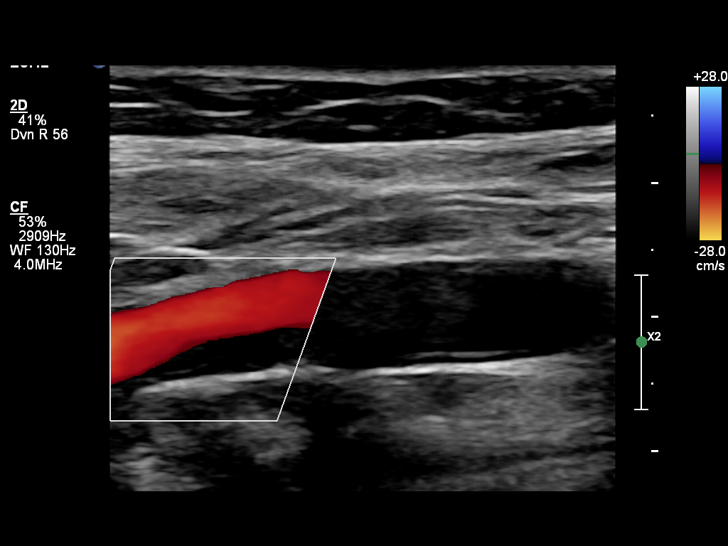
[im 63/81]
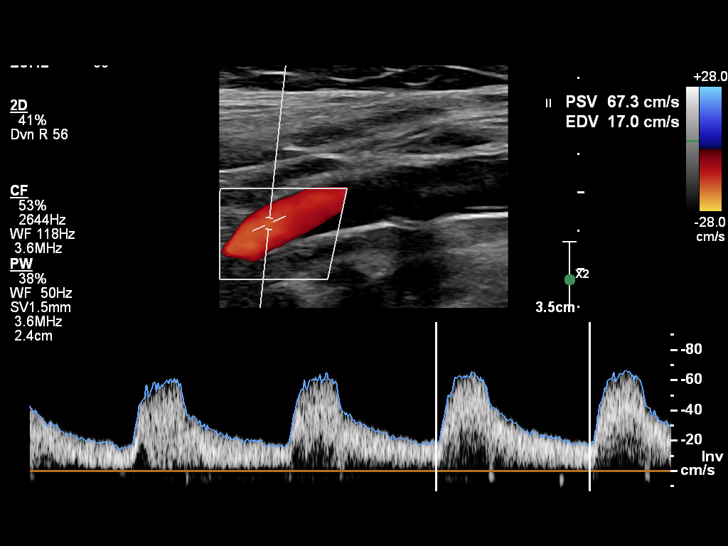
[im 67/81]
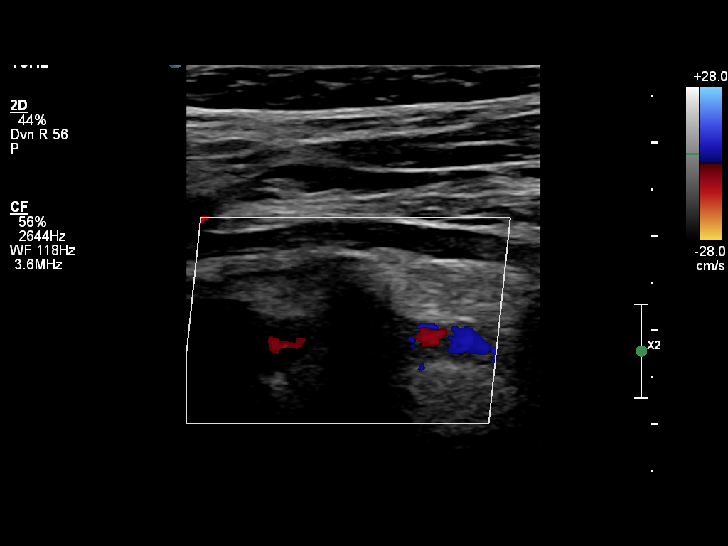
[im 74/81]
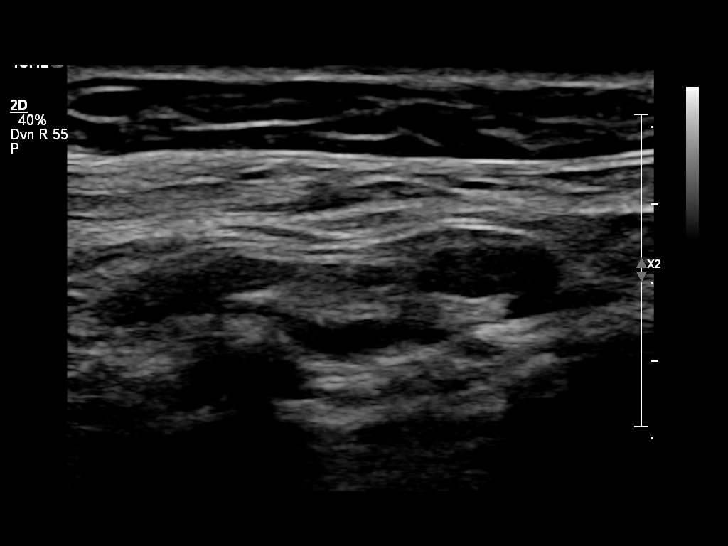
[im 81/81]
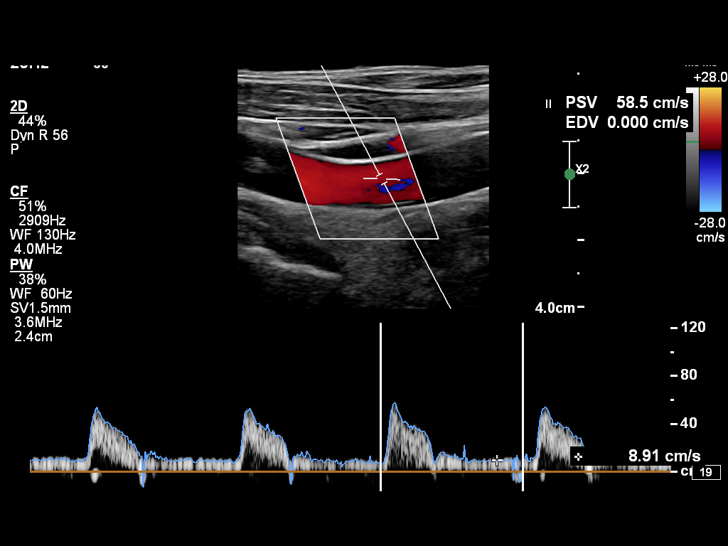

[14 of 24 positions shown; findings below may reference images not displayed]

FINDINGS: Right CCA

59 cm

Left CCA

57

PSV

IC/CC

EDV

Right Internal

76

1.29

15

Left Internal

72

1.27

20

Right Vertebral

Antegrade

Left Vertebral

Antegrade

There is no significant atherosclerotic plaque within the carotid bulbs and internal carotid arteries. No hemodynamically significant stenosis or abnormal elevation of velocity is seen at any level.  Mild atherosclerotic disease producing less than 50% diameter narrowing at the bifurcation of both common carotid arteries are noted.  Both vertebral arteries are patent with appropriate antegrade direction of flow.
IMPRESSION: No hemodynamically significant stenosis within the internal carotid arteries as per NASCET criteria.

## 2022-08-13 ENCOUNTER — Ambulatory Visit: Payer: Medicare Other | Attending: Physician Assistant | Admitting: Physician Assistant

## 2022-08-13 ENCOUNTER — Other Ambulatory Visit (INDEPENDENT_AMBULATORY_CARE_PROVIDER_SITE_OTHER): Payer: Self-pay | Admitting: Physician Assistant

## 2022-08-13 ENCOUNTER — Other Ambulatory Visit: Payer: Self-pay

## 2022-08-13 ENCOUNTER — Encounter (INDEPENDENT_AMBULATORY_CARE_PROVIDER_SITE_OTHER): Payer: Self-pay | Admitting: Physician Assistant

## 2022-08-13 VITALS — BP 128/68 | HR 64 | Temp 97.9°F | Ht 66.0 in | Wt 198.0 lb

## 2022-08-13 DIAGNOSIS — F419 Anxiety disorder, unspecified: Secondary | ICD-10-CM | POA: Insufficient documentation

## 2022-08-13 DIAGNOSIS — R1031 Right lower quadrant pain: Secondary | ICD-10-CM | POA: Insufficient documentation

## 2022-08-13 DIAGNOSIS — I10 Essential (primary) hypertension: Secondary | ICD-10-CM | POA: Insufficient documentation

## 2022-08-13 DIAGNOSIS — G894 Chronic pain syndrome: Secondary | ICD-10-CM | POA: Insufficient documentation

## 2022-08-13 DIAGNOSIS — E1165 Type 2 diabetes mellitus with hyperglycemia: Secondary | ICD-10-CM | POA: Insufficient documentation

## 2022-08-13 DIAGNOSIS — Z794 Long term (current) use of insulin: Secondary | ICD-10-CM

## 2022-08-13 DIAGNOSIS — N281 Cyst of kidney, acquired: Secondary | ICD-10-CM | POA: Insufficient documentation

## 2022-08-13 DIAGNOSIS — N390 Urinary tract infection, site not specified: Secondary | ICD-10-CM | POA: Insufficient documentation

## 2022-08-13 DIAGNOSIS — E538 Deficiency of other specified B group vitamins: Secondary | ICD-10-CM | POA: Insufficient documentation

## 2022-08-13 MED ORDER — DIAZEPAM 5 MG TABLET
5.0000 mg | ORAL_TABLET | Freq: Every day | ORAL | 0 refills | Status: DC
Start: 2022-08-13 — End: 2022-09-08

## 2022-08-13 MED ORDER — HYDROCODONE 7.5 MG-ACETAMINOPHEN 325 MG TABLET
1.0000 | ORAL_TABLET | Freq: Four times a day (QID) | ORAL | 0 refills | Status: DC | PRN
Start: 2022-08-13 — End: 2022-10-06

## 2022-08-13 MED ORDER — CYANOCOBALAMIN (VIT B-12) 1,000 MCG/ML INJECTION SOLUTION
1000.0000 ug | Freq: Once | INTRAMUSCULAR | Status: AC
Start: 2022-08-13 — End: 2022-08-13
  Administered 2022-08-13: 1000 ug via INTRAMUSCULAR

## 2022-08-13 NOTE — Progress Notes (Signed)
INTERNAL MEDICINE, BUILDING A  [address]  [place] [address]  Operated by [facility]  History and Physical     Name: Grace Hoffman MRN:  [id number]   Date: 08/13/2022 Age: 71 y.o.               Follow Up        Reason for Visit: Follow Up (Routine 1 month ) RT sided abd pain; recheck urine; chronic fatigue    History of Present Illness  Grace Hoffman is a 72 y.o. female who is being seen today in the office for f/u however complained of right-sided abdominal pain.  Patient states that she has had the pain off and on for several months but lately it seems to be more consistent.  Usually starting mid right side into her right lower pelvic area in.  She has a history of recurrent UTIs noted as well as right renal cyst and most recently completed a course of Cipro 500 b.i.d. for 10 days.  Currently denies burning with urination blood fever chills.  She has no diarrhea nausea vomiting her previous kidney stone.  Ultrasound obtained back in the spring was unremarkable however previous CT of abdomen and pelvis in 2021 did note the right renal cyst.  She has been referred to the urologist as well as nephrologist however appointment is not until October.  Currently no fever chills night sweats.    Follow-up blood pressure/hypertension currently 128/68.  She has been seen by the cardiologist in the past as well as recent carotid ultrasound which was unremarkable.  Currently taking Vasotec 2.5 mg daily as well as hydrochlorothiazide 25 mg daily p.r.n.Marland Kitchen   Denies chest pain.    F/U BS w history of uncontrolled diabetes with last hemoglobin A1c 8.0.  Currently patient is on metformin at 1000 mg twice daily along with glimepiride 4 mg twice daily and recently Toujeo 10 units at night was added however patient states that typically walks with the pharmacy to orders she has only been on it around a week for side.  Noted she was unable to tolerate being injectables such as Ozempic and Mounjaro due to  GI side effects.  States that her sugar has been running 70-74 in the morning at around 1:30 a.m. 5-140 in the evening.  Denies increased thirst or urination.    Follow-up chronic fatigue with noted history of B12 deficiency for which patient is requesting an injection today.  Patient states the B12 seems to help a little bit but she never "feels good".  Last B12 levels as well as hemoglobin stable.    Follow-up chronic anxiety for which patient uses Valium which she states helps her nerves along with her muscle aches and pains.  Patient has been on the medication for some time without any kind of side effects or sedation.  Medication refills requested.    Follow-up chronic pain/DJD/fibromyalgia for which she has been on chronic pain therapy/narcotics for numerous years; pain is stable at this current time on dosing and no abuse history is noted; patient also compliant w urine drug screen.  Patient has had a MRI on her back for which she followed up with Dr. Vear Clock and Dr. Vear Clock thinks that she has a bulging disc in her thoracic region however patient opted to do no surgical intervention.  Continues on oral medication treatment and  requesting medication refill today.    Follow-up blood magnesium was last level 1.8.  Patient denies any leg  cramps at this time states she is still taking the Mag-Ox 800 mg daily.    She also has chronic complaint of dizziness for which She has been seen by the ENT where she had her crystals realigned.  She states this helped a little bit but she feels like her balance is still an issue.  We did discuss stopping Effexor previous visit to see if possibly affecting dizziness however no improvement symptoms noted that continues medicine this time.  She also reports a mild tremor which concerns her.  Patient states it is worse at times and mainly noted in her hands.  Patient does see Dr. Aretha Parrot routinely and has had several test all of which were unremarkable.  States the neurologist  wants her to have physical therapy but due to her weakness at this current time she does not feel like she is able to do so.  History of migraines also noted for which he treats.  Patient denies a falls or syncope.      PHQ Questionnaire  Little interest or pleasure in doing things.: Not at all  Feeling down, depressed, or hopeless: Not at all  PHQ 2 Total: 0  Trouble falling or staying asleep, or sleeping too much.: Not at all  Feeling tired or having little energy: Not at all  Poor appetite or overeating: Not at all  Feeling bad about yourself/ that you are a failure in the past 2 weeks?: Not at all  Trouble concentrating on things in the past 2 weeks?: Not at all  Moving/Speaking slowly or being fidgety or restless  in the past 2 weeks?: Not at all  Thoughts that you would be better off DEAD, or of hurting yourself in some way.: Not at all  PHQ 9 Total: 0  Interpretation of Total Score: 0-4 No depression      Past Medical History:   Diagnosis Date    Anxiety     Chronic back pain     COVID-19 vaccine series completed     Depression     Diabetes mellitus, type 2 (CMS HCC)     Fibromyalgia affecting multiple sites     Hypertension     Hypomagnesemia     Insomnia disorder     Migraine     Mixed hyperlipidemia     Orthostatic hypotension     Osteoarthritis     Unspecified glaucoma(365.9)     Vitamin D deficiency          Past Surgical History:   Procedure Laterality Date    HX CHOLECYSTECTOMY      HX KNEE REPLACMENT Left     HX ROTATOR CUFF REPAIR Left     SKIN CANCER EXCISION      forehead removed      Family Medical History:       Problem Relation (Age of Onset)    Kidney failure Mother    No Known Problems Sister, Brother, Half-Sister, Half-Brother, Maternal Aunt, Maternal Uncle, Paternal Aunt, Paternal Uncle, Maternal Grandmother, Maternal Grandfather, Paternal Grandmother, Paternal Grandfather, Daughter, Son    Respiratory Problems Father            Social History     Tobacco Use    Smoking status: Never     Smokeless tobacco: Never   Vaping Use    Vaping Use: Never used   Substance Use Topics    Alcohol use: Never    Drug use: Never     Medication:  d-mannose 500 mg  Oral Capsule, Take 1 Capsule (500 mg total) by mouth Twice daily  enalapril (VASOTEC) 5 mg Oral Tablet, TAKE 1 TABLET BY MOUTH EVERY DAY  ergocalciferol, vitamin D2, (DRISDOL) 1,250 mcg (50,000 unit) Oral Capsule, Take 1 Capsule (50,000 Units total) by mouth Every 7 days  famotidine (PEPCID) 20 mg Oral Tablet, TAKE 1 TABLET BY MOUTH TWICE DAILY (Patient taking differently: Take 1 Tablet (20 mg total) by mouth Every evening)  glimepiride (AMARYL) 4 mg Oral Tablet, TAKE 1 TABLET(4 MG) BY MOUTH TWICE DAILY WITH A MEAL FOR DIABETES  hydroCHLOROthiazide (HYDRODIURIL) 25 mg Oral Tablet, Take 1 Tablet (25 mg total) by mouth Once per day as needed  HYDROcodone-acetaminophen (NORCO) 7.5-325 mg Oral Tablet, Take 1 Tablet by mouth Every 6 hours as needed for up to 30 days Indications: pain  insulin glargine U-300 conc (TOUJEO MAX U-300 SOLOSTAR) 300 unit/mL (3 mL) Subcutaneous Insulin Pen, Inject 10 Units under the skin Every night  latanoprost (XALATAN) 0.005 % Ophthalmic Drops, Administer 1 Drop into the left eye Every night  Levetiracetam 500 mg Oral Tablet Sustained Release 24 hr, Take 1 Tablet (500 mg total) by mouth Every night for 90 days  magnesium oxide (MAG-OX) 400 mg Oral Tablet, Take 2 Tablets (800 mg total) by mouth Once a day  MetFORMIN (GLUCOPHAGE) 1,000 mg Oral Tablet, Take 1 Tablet (1,000 mg total) by mouth Twice daily with food  Pen Needle, Disposable, 32 gauge x 5/32" Needle, Use as directed once daily Indications: E11.9 Type 2 Diabetic  rosuvastatin (CRESTOR) 10 mg Oral Tablet, TAKE 1 TABLET BY MOUTH EVERY NIGHT AT BEDTIME  solifenacin (VESICARE) 5 mg Oral Tablet, TAKE 1 TABLET(5 MG) BY MOUTH EVERY DAY  SUMAtriptan (IMITREX) 50 mg Oral Tablet, sumatriptan 50 mg tablet   TAKE 1 TABLET BY MOUTH AT ONSET OF HEADACHE. IF NO RELIEF MAY REPEAT 1 AFTER AT  LEAST 2 HOURS. MAX OF 4 TABS PER 24 HOURS  venlafaxine (EFFEXOR XR) 37.5 mg Oral Capsule, Sust. Release 24 hr, Take 1 Capsule (37.5 mg total) by mouth Every night  ciprofloxacin HCl (CIPRO) 500 mg Oral Tablet, Take 1 Tablet (500 mg total) by mouth Twice daily for 10 days  diazePAM (VALIUM) 5 mg Oral Tablet, Take 1 Tablet (5 mg total) by mouth Once a day    No facility-administered medications prior to visit.    Allergies:  Allergies   Allergen Reactions    Codeine Nausea/ Vomiting    Amoxicillin Diarrhea    Macrobid [Nitrofurantoin Monohyd/M-Cryst] Nausea/ Vomiting       Physical Exam:  Vitals:    08/13/22 1328   BP: 128/68   Pulse: 64   Temp: 36.6 C (97.9 F)   TempSrc: Tympanic   SpO2: 98%   Weight: 89.8 kg (198 lb)   Height: 1.676 m (5\' 6" )   BMI: 32.02      Physical Exam  Vitals and nursing note reviewed.   Constitutional:       Appearance: Normal appearance. She is obese.   HENT:      Right Ear: Tympanic membrane normal.      Left Ear: Tympanic membrane normal.      Mouth/Throat:      Mouth: Mucous membranes are moist.      Pharynx: Oropharynx is clear.   Eyes:      Extraocular Movements: Extraocular movements intact.      Pupils: Pupils are equal, round, and reactive to light.   Cardiovascular:      Rate and Rhythm:  Normal rate and regular rhythm.      Pulses: Normal pulses.   Pulmonary:      Effort: Pulmonary effort is normal.      Breath sounds: Normal breath sounds.   Abdominal:      General: Bowel sounds are normal.      Palpations: Abdomen is soft.      Tenderness: There is abdominal tenderness (Right lower quad). There is no right CVA tenderness, left CVA tenderness or rebound.   Musculoskeletal:         General: No swelling or tenderness. Normal range of motion.      Cervical back: Normal range of motion and neck supple.      Comments: Arthritic changes hands and knees noted bilateral  Chronic trigger points upper back and thigh area  Chronic low back pain with range of motion  Negative straight leg  raises  Gait stable   Skin:     General: Skin is warm.      Findings: No lesion or rash.   Neurological:      General: No focal deficit present.      Mental Status: She is alert and oriented to person, place, and time.      Sensory: No sensory deficit.      Motor: Weakness (Strength of upper and lower extremities decreased however equal bilateral) present.      Gait: Gait normal.   Psychiatric:         Mood and Affect: Mood normal.         Behavior: Behavior normal.         Thought Content: Thought content normal.         Judgment: Judgment normal.     Urine Dip Results:   Time collected: 1426  Glucose (Ref Range: Negative mg/dL): Negative  Bilirubin (Ref Range: Negative mg/dL): (!) Small  Ketones (Ref Range: Negative mg/dL): Negative  Urine Specific Gravity (Ref Range: 1.005 - 1.030): 1.025  Blood (urine) (Ref Range: Negative mg/dL): Negative  pH (Ref Range: 5.0 - 8.0): 5.0  Protein (Ref Range: Negative mg/dL): (!) Trace  Urobilinogen (Ref Range: Negative mg/dL): 0.2mg /dL (Normal)  Nitrite (Ref Range: Negative): Negative  Leukocytes (Ref Range: Negative WBC's/uL): (!) Small           Assessment/Plan:  Problem List Items Addressed This Visit          Cardiovascular System    Hypertension       Neurologic    Chronic pain       Nephrology    Recurrent UTI    Relevant Orders    POCT Urine Dipstick    URINALYSIS, MACROSCOPIC AND MICROSCOPIC W/CULTURE REFLEX    Renal cyst, acquired, right       Endocrine    Uncontrolled type 2 diabetes mellitus with hyperglycemia (CMS HCC)    B12 deficiency       Psychiatric    Anxiety       Other    Hypomagnesemia     Other Visit Diagnoses       Right lower quadrant abdominal pain    -  Primary    Relevant Orders    CT ABDOMEN PELVIS WO IV CONTRAST          CT abdomen pelvis ordered  Urine sent for culture  Will await antibiotic treatment until cultures obtained in the meantime patient encouraged to increase fluids continue probiotic  Continue follow-up with nephrologist and urologist  as scheduled in October  Will continue follow-up with neurologist/ENT is scheduled   B12 shot given  Encouraged cane for stability  Continue blood pressure/blood sugar monitoring outpatient  Norco 7.5mg  #90 no refills given along with Valium 5 mg daily   Refuses colonoscopy  Follow-up pending urine results otherwise as noted    Follow up:   Post-Discharge Follow Up Appointments       Wednesday Aug 13, 2022    Return Patient Visit with Eyvonne Mechaniclvis, Amy, PA-C at  1:00 PM      Thursday Sep 11, 2022    Return Patient Visit with Eyvonne Mechaniclvis, Amy, PA-C at  2:30 PM      Tuesday Oct 07, 2022    Return Patient Visit with Arlana Hoveutrone, Joseph, DO at 11:00 AM      Thursday Apr 09, 2023    Return Telephone Visit with Eyvonne MechanicAlvis, Amy, PA-C at  1:30 PM      Internal Medicine, Building A  [facility], [place]  [address]  [place] [id number]  [phone] Urology, [facility]  [facility] [place], [place]  [address]  [place] [id number]  [phone]           Seek medical attention for new or worsening symptoms.  Patient has been seen in this clinic within the last 3 years.     Amy Alvis, PA-C        I personally reviewed the documentation of care provided by the PA-C, Weyman PedroLaDonna Jazia Faraci, D.O.     This note was partially created using MModal Fluency Direct system (voice recognition software) and is inherently subject to errors including those of syntax and "sound-alike" substitutions which may escape proofreading.  In such instances, original meaning may be extrapolated by contextual derivation.

## 2022-08-13 NOTE — Telephone Encounter (Signed)
Patient is here today for routine 1 month follow up for medication refills.

## 2022-08-13 NOTE — Nursing Note (Signed)
Patient is here for routine 1 month follow up for medication refills.

## 2022-08-13 NOTE — Nursing Note (Signed)
08/13/22 1400   Urine test  (Siemens Multistix 10 SG)   Performed Status: Manual   Time collected 1426   Color (Ref Range: Yellow) (!) Dark Yellow   Clarity (Ref Range: Clear) (!) Cloudy   Glucose (Ref Range: Negative mg/dL) Negative   Bilirubin (Ref Range: Negative mg/dL) (!) 1+   Ketones (Ref Range: Negative mg/dL) Negative   Urine Specific Gravity (Ref Range: 1.005 - 1.030) 1.025   Blood (urine) (Ref Range: Negative mg/dL) Negative   pH (Ref Range: 5.0 - 8.0) 5.0   Protein (Ref Range: Negative mg/dL) (!) Trace   Urobilinogen (Ref Range: Negative mg/dL) 0.2mg /dL (Normal)   Nitrite (Ref Range: Negative) Negative   Leukocytes (Ref Range: Negative WBC's/uL) (!) 1+   Initials KD

## 2022-08-18 IMAGING — CT CT ABDOMEN & PELVIS W/O CONTRAST
2 of 3 series · 17 of 46 positions shown, 19 images · non-contrast
Comparison: April 30, 2020

﻿EXAM:  [id number]   CT ABDOMEN & PELVIS W/O CONTRAST
INDICATION: Right lower quadrant pain.
TECHNIQUE: Noncontrast multiplanar spiral computed tomography was performed.  Exam was performed using one or more of the following dose reduction techniques: Automated exposure control, adjustment of the mA and/or kV according to patient size, or the use of iterative reconstruction technique. Radiation dose 929 mGy cm.

[axial · axial · 0.84mm/px · z∈[-657,-234]mm · 14 of 163 slices shown, 16 images]
[im 11/163  soft-tissue]
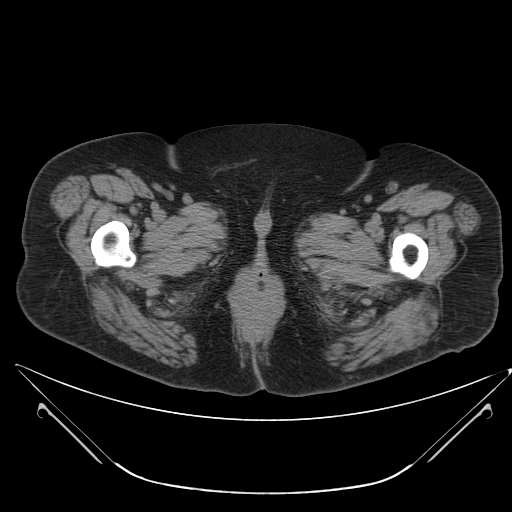
[im 11/163  bone]
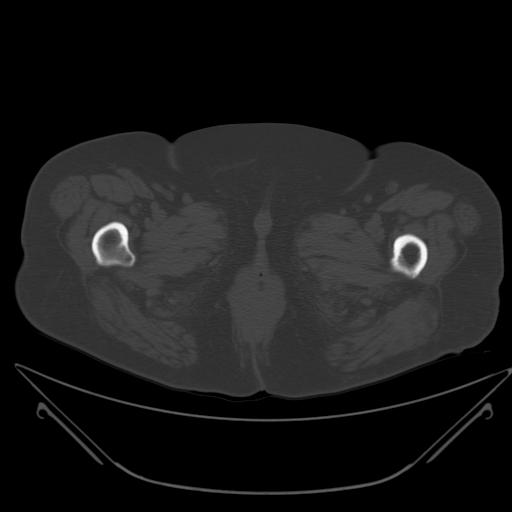
[im 21/163  soft-tissue]
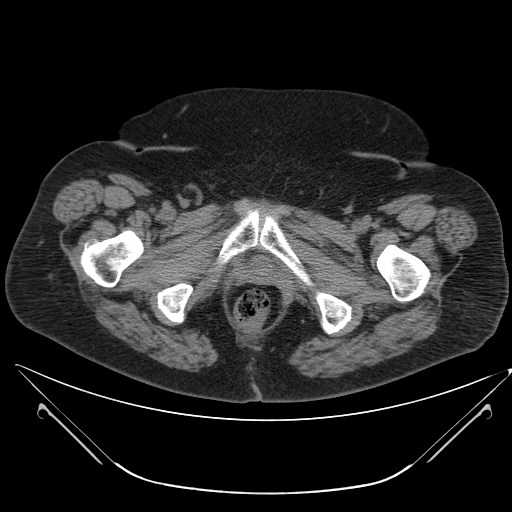
[im 32/163  soft-tissue]
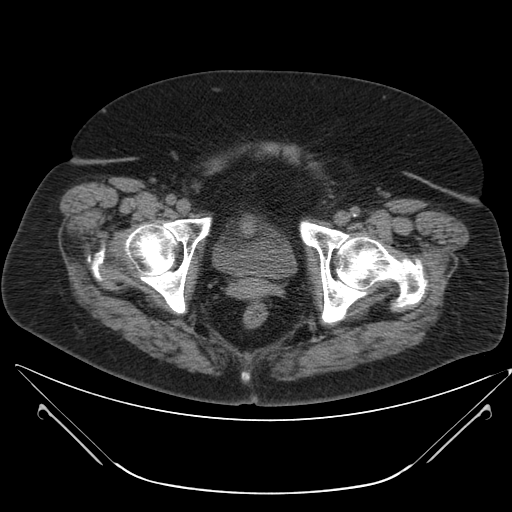
[im 42/163  soft-tissue]
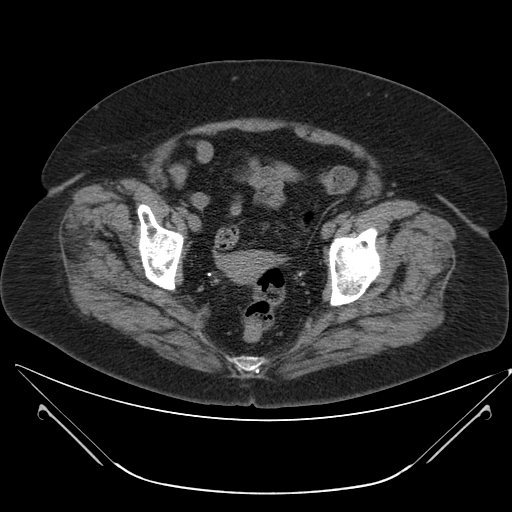
[im 53/163  soft-tissue]
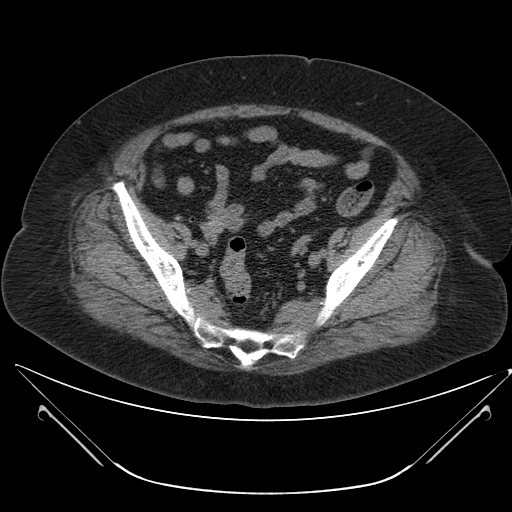
[im 63/163  soft-tissue]
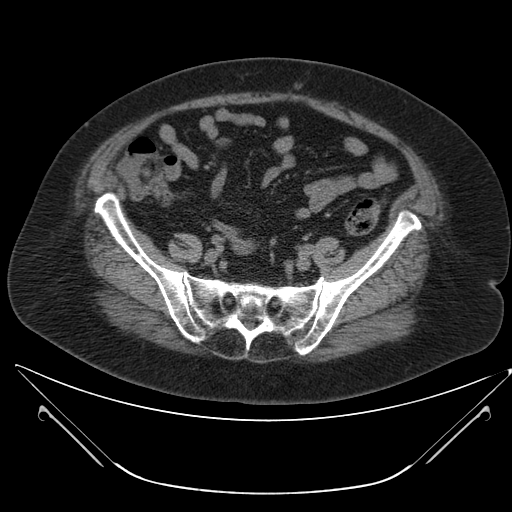
[im 74/163  soft-tissue]
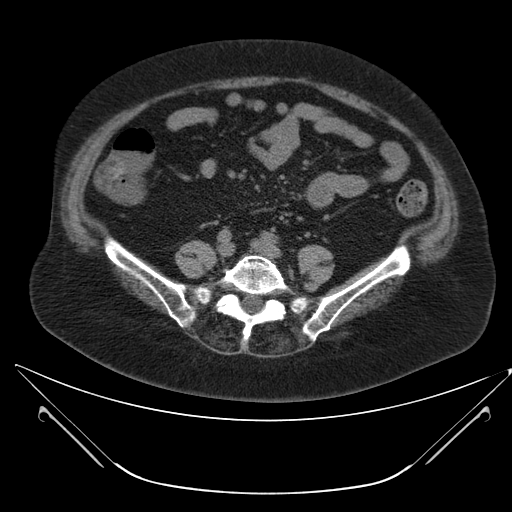
[im 89/163  soft-tissue]
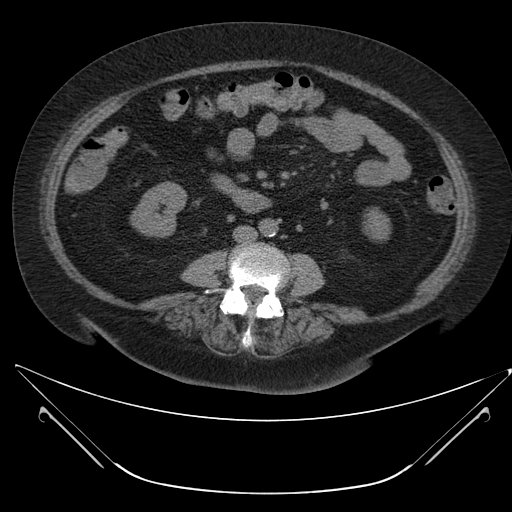
[im 100/163  soft-tissue]
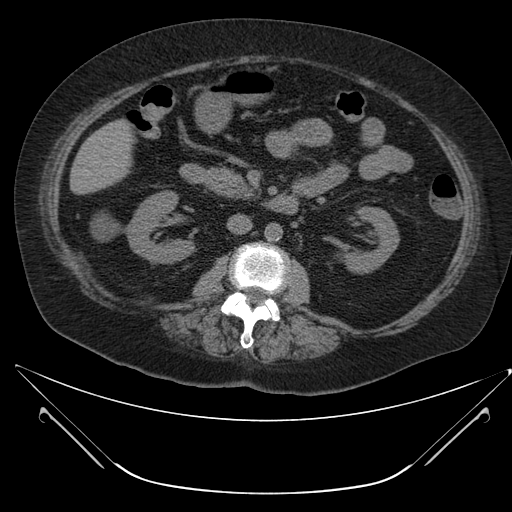
[im 100/163  bone]
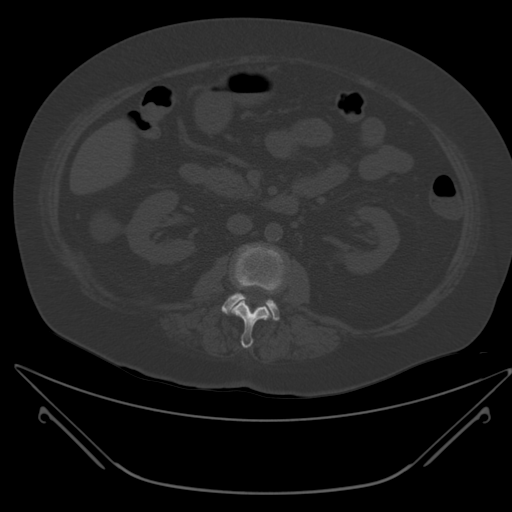
[im 110/163  soft-tissue]
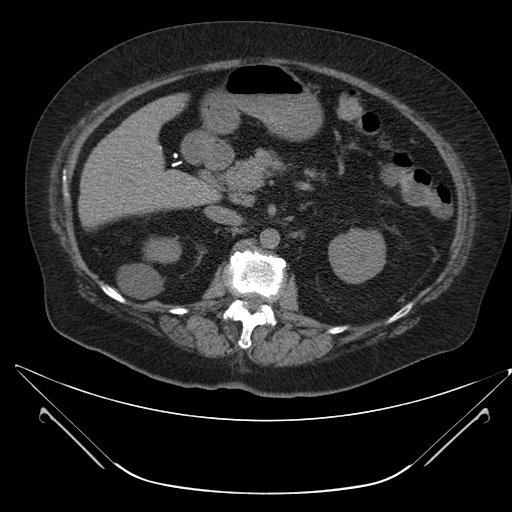
[im 121/163  soft-tissue]
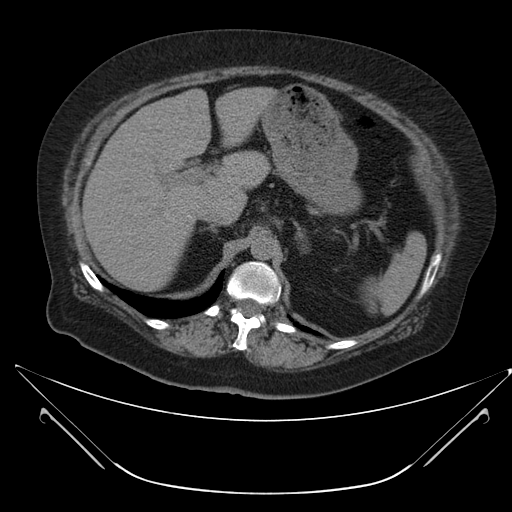
[im 131/163  soft-tissue]
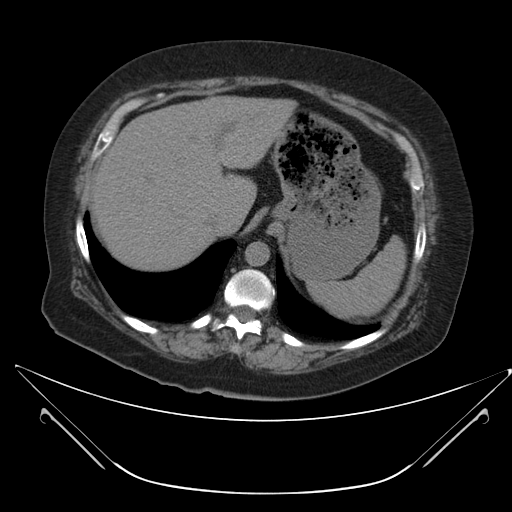
[im 142/163  soft-tissue]
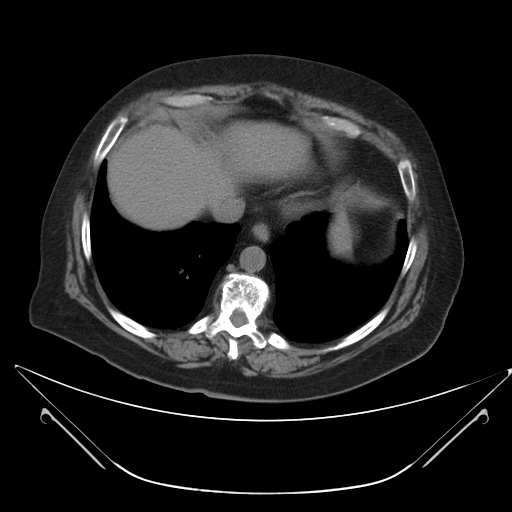
[im 152/163  soft-tissue]
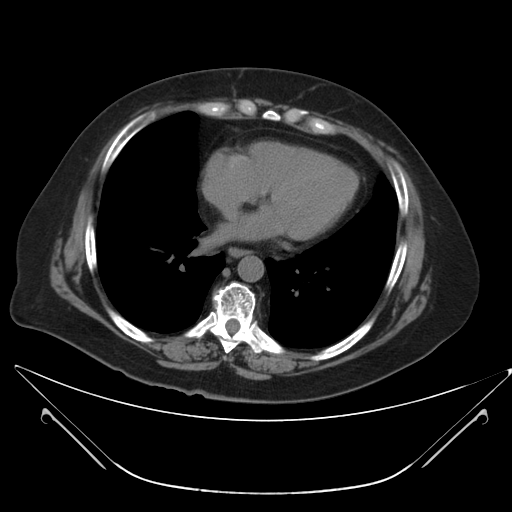

[cor · coronal · 0.95mm/px · 3 of 74 slices shown]
[im 25/74  soft-tissue]
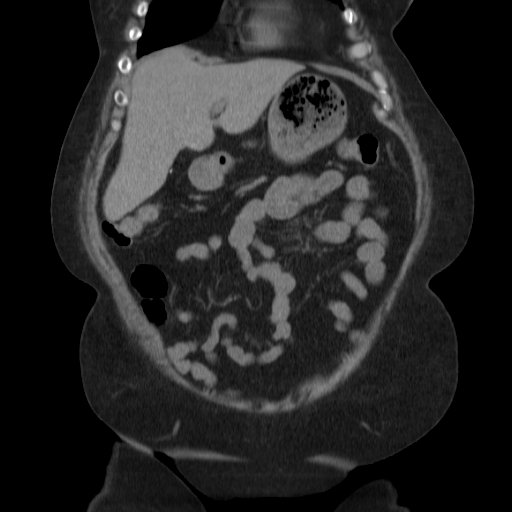
[im 33/74  soft-tissue]
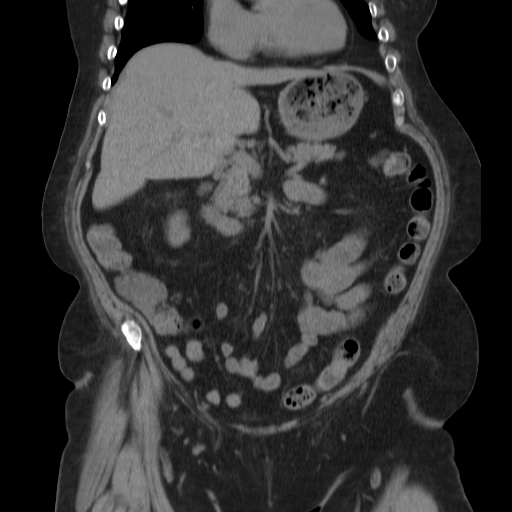
[im 41/74  soft-tissue]
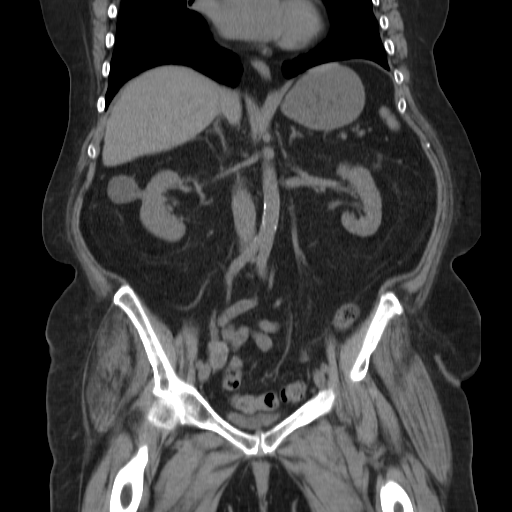

[17 of 46 positions shown; findings below may reference images not displayed]

FINDINGS: The lung bases appear clear. 

The liver, spleen, pancreas, adrenals, appendix, and left kidney appear unremarkable.  There are 3 large, exophytic renal cysts on the right that were also present on the prior CT dated April 30, 2020.

There is evidence of prior cholecystectomy.  The aorta is somewhat calcified and of normal caliber.  There is no mass, lymph node enlargement, abnormal fluid collection, inflammatory change, bowel obstruction, or free intraperitoneal gas.  There is no evidence of diverticulitis or abscess.  Moderate degenerative changes are noted in the visualized spine.
IMPRESSION: 1. Essentially unremarkable examination.

2. No acute abnormality is seen.

## 2022-08-19 ENCOUNTER — Other Ambulatory Visit: Payer: Self-pay

## 2022-08-19 ENCOUNTER — Ambulatory Visit: Payer: Medicare Other | Attending: Physician Assistant

## 2022-08-19 ENCOUNTER — Encounter (INDEPENDENT_AMBULATORY_CARE_PROVIDER_SITE_OTHER): Payer: Self-pay

## 2022-08-19 VITALS — HR 84 | Temp 98.4°F

## 2022-08-19 DIAGNOSIS — N390 Urinary tract infection, site not specified: Secondary | ICD-10-CM | POA: Insufficient documentation

## 2022-08-19 NOTE — Nursing Note (Signed)
Patient in office for repeat UA and culture .

## 2022-08-21 NOTE — Progress Notes (Signed)
I discussed the patient's care with the Resident prior to the patient leaving the clinic. Any significant discussion points are noted.    Boyd Litaker, PA-C 08/21/2022, 15:01

## 2022-08-26 ENCOUNTER — Other Ambulatory Visit (INDEPENDENT_AMBULATORY_CARE_PROVIDER_SITE_OTHER): Payer: Self-pay

## 2022-08-26 DIAGNOSIS — N390 Urinary tract infection, site not specified: Secondary | ICD-10-CM

## 2022-09-03 ENCOUNTER — Other Ambulatory Visit (INDEPENDENT_AMBULATORY_CARE_PROVIDER_SITE_OTHER): Payer: Self-pay | Admitting: Physician Assistant

## 2022-09-03 DIAGNOSIS — M479 Spondylosis, unspecified: Secondary | ICD-10-CM

## 2022-09-08 ENCOUNTER — Encounter (INDEPENDENT_AMBULATORY_CARE_PROVIDER_SITE_OTHER): Payer: Self-pay | Admitting: Physician Assistant

## 2022-09-08 ENCOUNTER — Other Ambulatory Visit: Payer: Self-pay

## 2022-09-08 ENCOUNTER — Ambulatory Visit: Payer: Medicare Other | Attending: Physician Assistant | Admitting: Physician Assistant

## 2022-09-08 ENCOUNTER — Telehealth (RURAL_HEALTH_CENTER): Payer: Self-pay | Admitting: Family Medicine

## 2022-09-08 VITALS — BP 112/72 | HR 84 | Temp 97.4°F | Ht 66.0 in | Wt 197.0 lb

## 2022-09-08 DIAGNOSIS — F419 Anxiety disorder, unspecified: Secondary | ICD-10-CM | POA: Insufficient documentation

## 2022-09-08 DIAGNOSIS — E538 Deficiency of other specified B group vitamins: Secondary | ICD-10-CM

## 2022-09-08 DIAGNOSIS — G894 Chronic pain syndrome: Secondary | ICD-10-CM

## 2022-09-08 DIAGNOSIS — E1165 Type 2 diabetes mellitus with hyperglycemia: Secondary | ICD-10-CM | POA: Insufficient documentation

## 2022-09-08 DIAGNOSIS — G8929 Other chronic pain: Secondary | ICD-10-CM | POA: Insufficient documentation

## 2022-09-08 DIAGNOSIS — I10 Essential (primary) hypertension: Secondary | ICD-10-CM

## 2022-09-08 DIAGNOSIS — Z87440 Personal history of urinary (tract) infections: Secondary | ICD-10-CM | POA: Insufficient documentation

## 2022-09-08 DIAGNOSIS — N390 Urinary tract infection, site not specified: Secondary | ICD-10-CM

## 2022-09-08 IMAGING — DX XRAY LUMBAR SPINE MINIMUM 4 VIEWS
1 series · 4 of 4 positions shown · non-contrast
Comparison: None available.

﻿EXAM:  [id number]      XRAY LUMBAR SPINE MINIMUM 4 VIEWS
INDICATION: Low back pain, chronic.  Sciatica.

[Series 1: AP · 0.14mm/px · 4 of 4 slices shown]
[im 1/4]
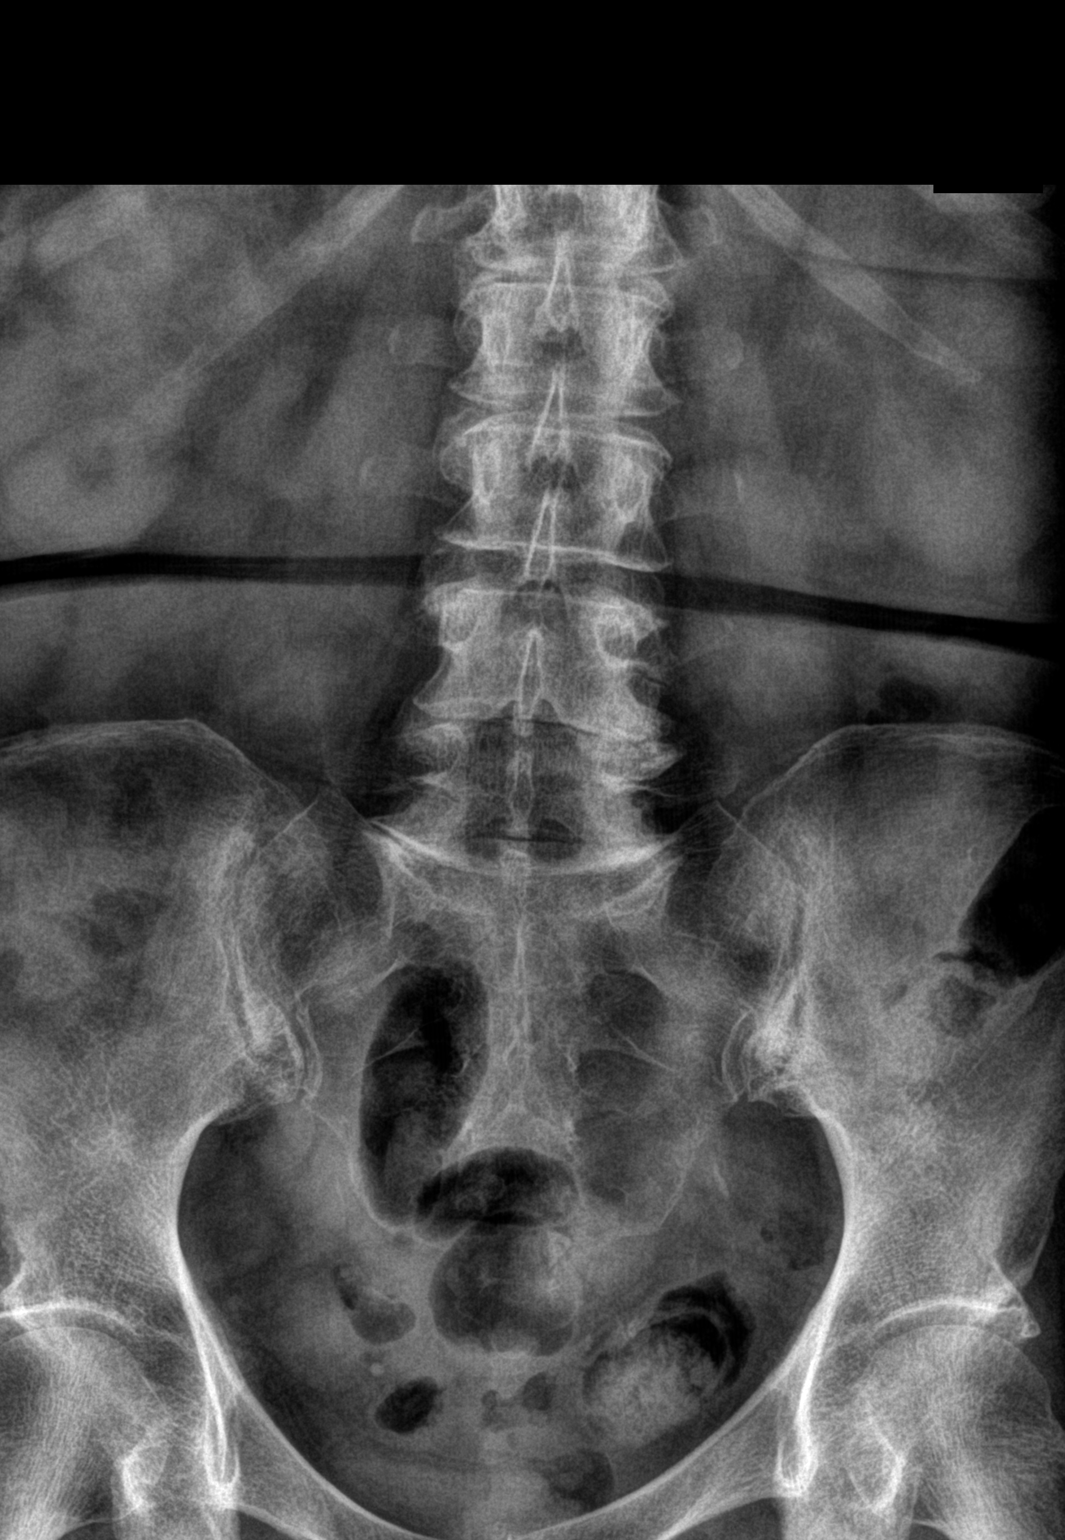
[im 2/4]
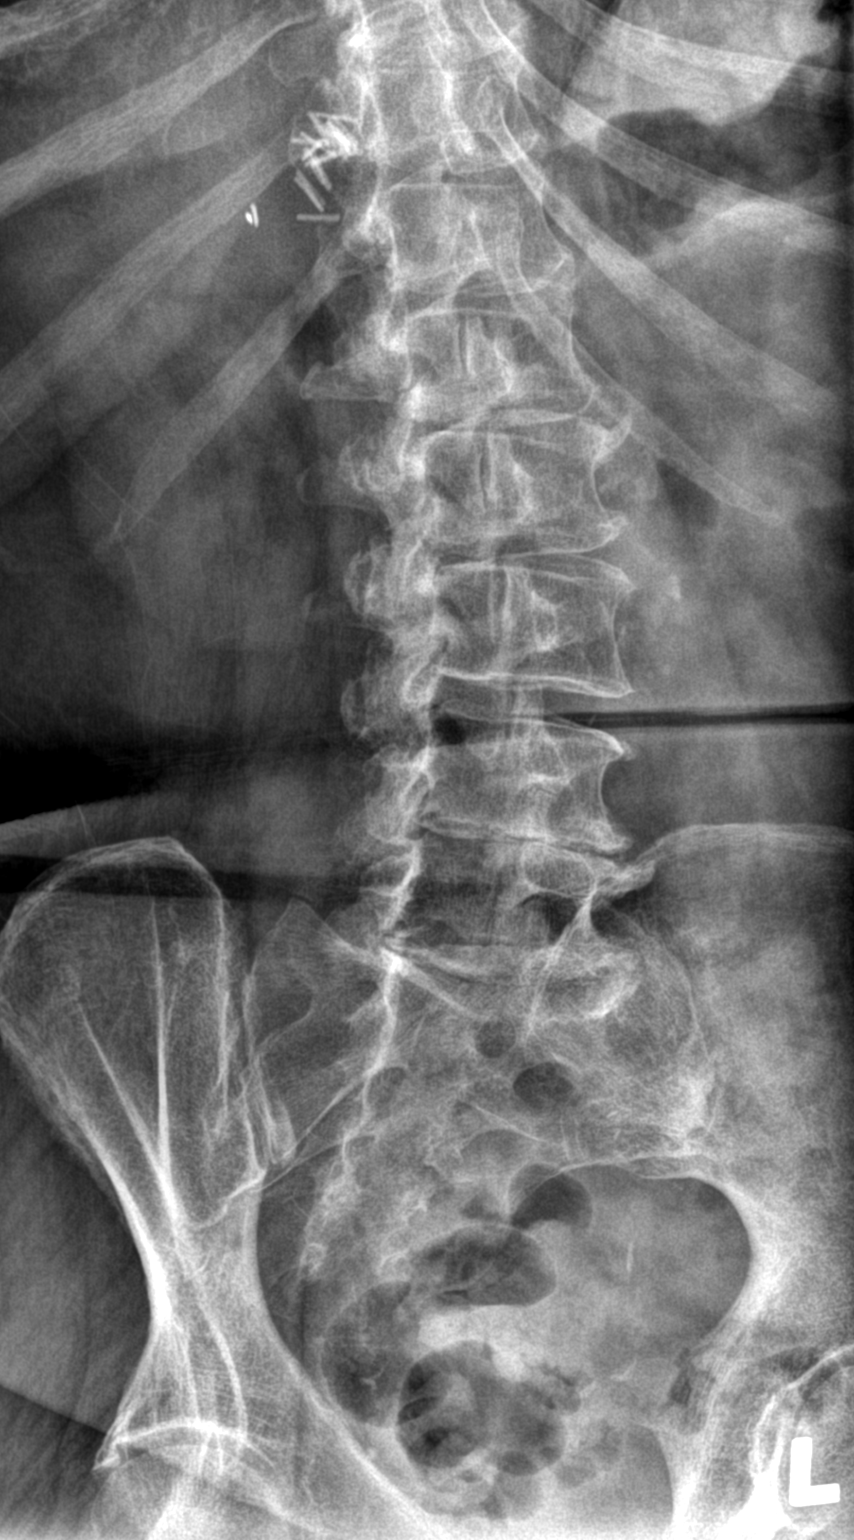
[im 3/4]
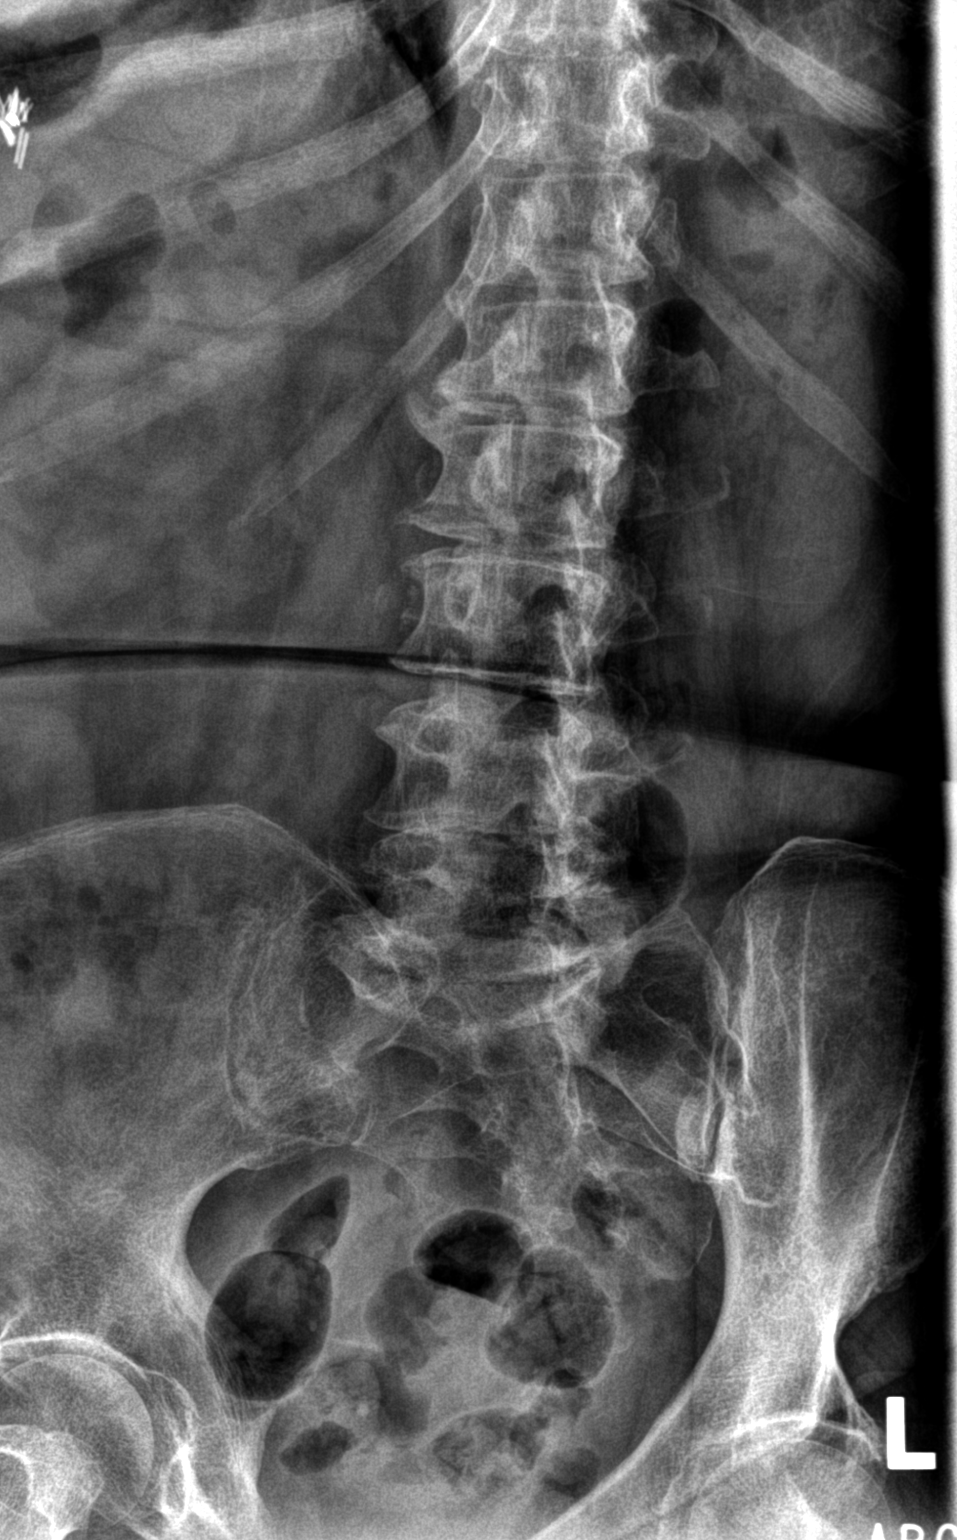
[im 4/4]
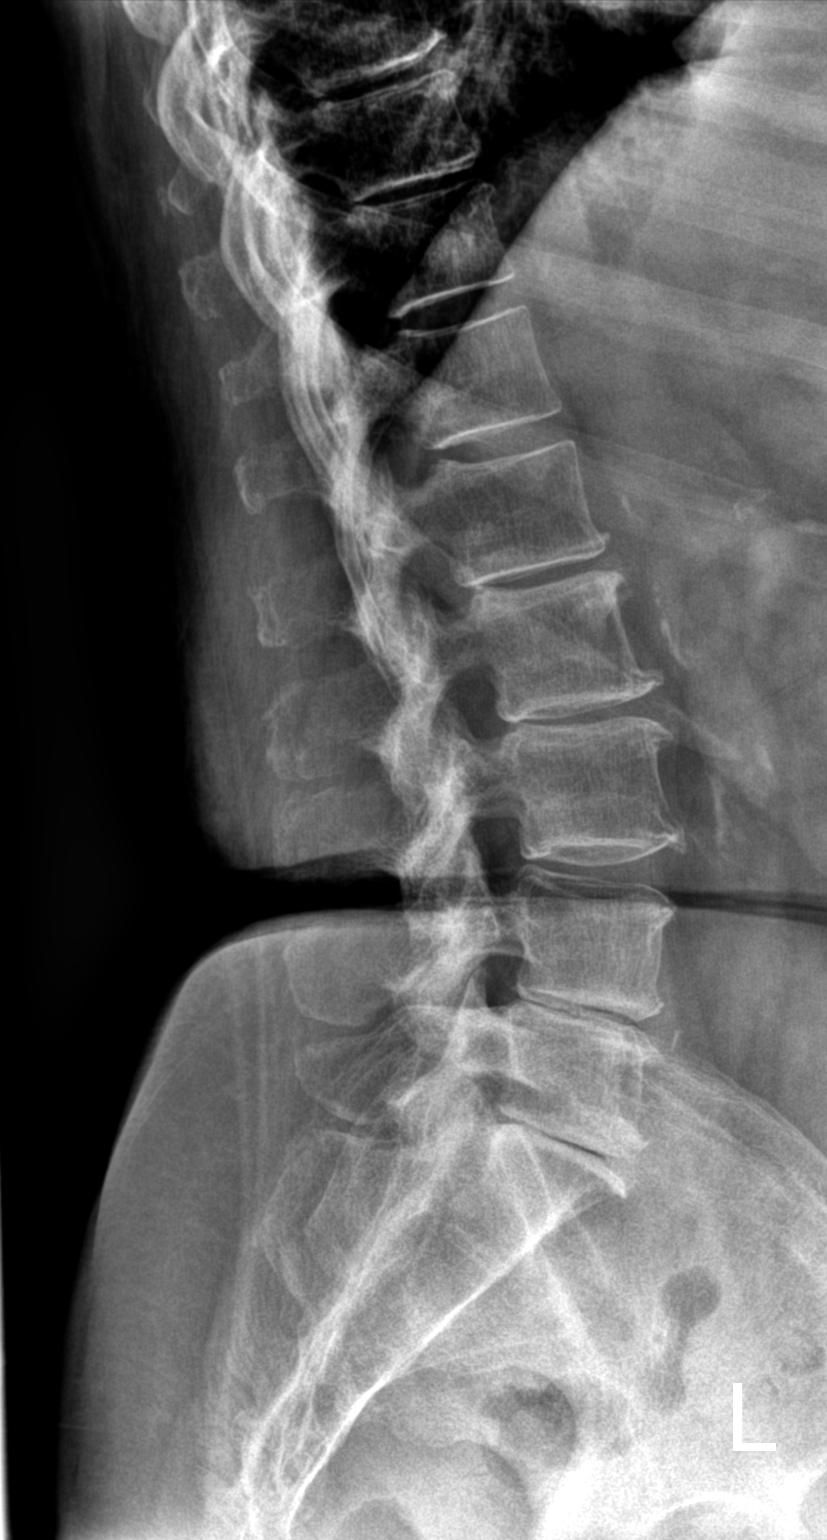

[4 of 4 positions shown; findings below may reference images not displayed]

FINDINGS: Five lumbar segments are noted.  Advanced degenerative disc changes at L5-S1 level and lesser degree at L4-5 level.  Milder degenerative changes of other lumbar discs are noted.  Faint calcific changes of abdominal aorta.
IMPRESSION: Significant multilevel degenerative disc changes most prominently at L4-5 and L5-S1 levels. If symptoms are significant and persistent, further evaluation with MRI is recommended.

## 2022-09-08 MED ORDER — CYANOCOBALAMIN (VIT B-12) 1,000 MCG/ML INJECTION SOLUTION
1000.0000 ug | Freq: Once | INTRAMUSCULAR | Status: AC
Start: 2022-09-08 — End: 2022-09-08
  Administered 2022-09-08: 1000 ug via INTRAMUSCULAR

## 2022-09-08 MED ORDER — DIAZEPAM 5 MG TABLET
5.0000 mg | ORAL_TABLET | Freq: Every day | ORAL | 0 refills | Status: DC
Start: 2022-09-08 — End: 2022-10-06

## 2022-09-08 NOTE — Nursing Note (Signed)
Patient in office today for 1 cdm 1 month refills

## 2022-09-08 NOTE — Progress Notes (Unsigned)
INTERNAL MEDICINE, BUILDING A  [address]  [place] [address]  Operated by [facility]  History and Physical     Name: Grace Hoffman MRN:  [id number]   Date: 09/08/2022 Age: 71 y.o.               Follow Up        Reason for Visit: Follow Up discussed CT abdomen results; med refills    History of Present Illness  Grace Hoffman is a 71 y.o. female who is being seen today in the office for f/u however multiple complaints noted.  Patient complaining of urinary issues with incontinence increased pressure frequency.  She has a history of recurrent UTIs noted but denies burning with urination blood fever chills.  She has been seen by a urologist in the past several times who retired that is referred to local urologist in [place] but states she went there was not impressed that she will not be going back.  Patient had been taking Mybetric started from her original urologist the states the medication was not covered by her insurance a very extensive.  She is not sure as to whether it even really work that great so would like for me to switch to something else if possible.  She states that family medication she has tried for the incontinence.    She continues to complain of dizziness which has been a chronic complaint of hers for a couple months.  She has been seen by the ENT where she had her crystals realigned.  She states this helped a little bit but she feels like her balance is still an issue.  She also reports a mild tremor which concerns her.  Patient states it is worse at times and mainly noted in her hands.  Patient does see Dr. Bosie Helper routinely and has had several test all of which were unremarkable.  States the neurologist wants her to have physical therapy but due to her weakness at this current time she does not feel like she is able to do so.  History of migraines also noted for which he treats.  Patient denies a falls or syncope.      Increased fatigue also noted worse over  the past week or so.  Patient has a chronic history of fatigue however noted.  Once again urine issues noted as well as B12 deficiency for which patient is requesting an injection today.    Follow-up blood pressure/hypertension currently 124/72 in the right arm 144/72 in the left.  Patient questioning why blood pressures are different today than she has had a history of this in the past.  We had discussed workup of her carotid and subclavian arteries however she had been seen by the neurologist as well as a cardiologist in the past that she thought had done ultrasound but today tells me she is not really sure is testing has been completed of her neck area and is willing to get ultrasounds thought necessary.  Patient has had issues in the past with low blood blood pressure for which medications were adjusted.  She is currently taking Vasotec 2.5 mg daily as well as hydrochlorothiazide 25 mg daily p.r.n.Marland Kitchen   Denies chest pain.    F/U BS w history of uncontrolled diabetes with hemoglobin A1c 7.3.  Currently patient is on metformin at 1000 mg twice daily along with glimepiride 4 mg twice daily.  Noted she was unable to tolerate being injectables such as Ozempic and Mounjaro  due to GI side effects.  Denies increased thirst or urination.    Follow-up chronic pain/DJD/fibromyalgia for which she has been on chronic pain therapy/narcotics for numerous years; pain is stable at this current time on dosing no abuse history is noted; patient also compliant w UDS; patient states that she had a MRI on her back and she states that she took it to Dr. Hardin Negus and Dr. Hardin Negus thinks that she has a bulging disc in her thoracic region; requesting medication refill.    PHQ Questionnaire  Little interest or pleasure in doing things.: Not at all  Feeling down, depressed, or hopeless: Not at all  PHQ 2 Total: 0      Past Medical History:   Diagnosis Date    Anxiety     Chronic back pain     COVID-19 vaccine series completed     Depression      Diabetes mellitus, type 2 (CMS HCC)     Fibromyalgia affecting multiple sites     Hypertension     Hypomagnesemia     Insomnia disorder     Migraine     Mixed hyperlipidemia     Orthostatic hypotension     Osteoarthritis     Unspecified glaucoma(365.9)     Vitamin D deficiency          Past Surgical History:   Procedure Laterality Date    HX CHOLECYSTECTOMY      HX KNEE REPLACMENT Left     HX ROTATOR CUFF REPAIR Left     SKIN CANCER EXCISION      forehead removed      Family Medical History:       Problem Relation (Age of Onset)    Kidney failure Mother    No Known Problems Sister, Brother, Half-Sister, Half-Brother, Maternal Aunt, Maternal Uncle, Paternal Aunt, Paternal Uncle, Maternal Grandmother, Maternal Grandfather, Paternal Grandmother, Paternal Grandfather, Daughter, Son    Respiratory Problems Father            Social History     Tobacco Use    Smoking status: Never    Smokeless tobacco: Never   Vaping Use    Vaping Use: Never used   Substance Use Topics    Alcohol use: Never    Drug use: Never     Medication:  d-mannose 500 mg Oral Capsule, Take 1 Capsule (500 mg total) by mouth Twice daily  diazePAM (VALIUM) 5 mg Oral Tablet, Take 1 Tablet (5 mg total) by mouth Once a day  enalapril (VASOTEC) 5 mg Oral Tablet, TAKE 1 TABLET BY MOUTH EVERY DAY  ergocalciferol, vitamin D2, (DRISDOL) 1,250 mcg (50,000 unit) Oral Capsule, Take 1 Capsule (50,000 Units total) by mouth Every 7 days  famotidine (PEPCID) 20 mg Oral Tablet, TAKE 1 TABLET BY MOUTH TWICE DAILY (Patient taking differently: Take 1 Tablet (20 mg total) by mouth Every evening)  glimepiride (AMARYL) 4 mg Oral Tablet, TAKE 1 TABLET(4 MG) BY MOUTH TWICE DAILY WITH A MEAL FOR DIABETES  hydroCHLOROthiazide (HYDRODIURIL) 25 mg Oral Tablet, Take 1 Tablet (25 mg total) by mouth Once per day as needed  HYDROcodone-acetaminophen (NORCO) 7.5-325 mg Oral Tablet, Take 1 Tablet by mouth Every 6 hours as needed for up to 30 days Indications: pain  insulin glargine  U-300 conc (TOUJEO MAX U-300 SOLOSTAR) 300 unit/mL (3 mL) Subcutaneous Insulin Pen, Inject 10 Units under the skin Every night  latanoprost (XALATAN) 0.005 % Ophthalmic Drops, Administer 1 Drop into the left eye Every night  Levetiracetam 500 mg Oral Tablet Sustained Release 24 hr, Take 1 Tablet (500 mg total) by mouth Every night for 90 days  magnesium oxide (MAG-OX) 400 mg Oral Tablet, Take 2 Tablets (800 mg total) by mouth Once a day  MetFORMIN (GLUCOPHAGE) 1,000 mg Oral Tablet, Take 1 Tablet (1,000 mg total) by mouth Twice daily with food  Pen Needle, Disposable, 32 gauge x 5/32" Needle, Use as directed once daily Indications: E11.9 Type 2 Diabetic  rosuvastatin (CRESTOR) 10 mg Oral Tablet, TAKE 1 TABLET BY MOUTH EVERY NIGHT AT BEDTIME  solifenacin (VESICARE) 5 mg Oral Tablet, TAKE 1 TABLET(5 MG) BY MOUTH EVERY DAY  SUMAtriptan (IMITREX) 50 mg Oral Tablet, sumatriptan 50 mg tablet   TAKE 1 TABLET BY MOUTH AT ONSET OF HEADACHE. IF NO RELIEF MAY REPEAT 1 AFTER AT LEAST 2 HOURS. MAX OF 4 TABS PER 24 HOURS  venlafaxine (EFFEXOR XR) 37.5 mg Oral Capsule, Sust. Release 24 hr, Take 1 Capsule (37.5 mg total) by mouth Every night    No facility-administered medications prior to visit.    Allergies:  Allergies   Allergen Reactions    Codeine Nausea/ Vomiting    Amoxicillin Diarrhea    Macrobid [Nitrofurantoin Monohyd/M-Cryst] Nausea/ Vomiting       Physical Exam:  Vitals:    09/08/22 1137   BP: 112/72   Pulse: 84   Temp: 36.3 C (97.4 F)   TempSrc: Tympanic   SpO2: 94%   Weight: 89.4 kg (197 lb)   Height: 1.676 m (5\' 6" )   BMI: 31.86      Physical Exam  Vitals and nursing note reviewed.   Constitutional:       Appearance: Normal appearance. She is obese.   HENT:      Right Ear: Tympanic membrane normal.      Left Ear: Tympanic membrane normal.      Mouth/Throat:      Mouth: Mucous membranes are moist.      Pharynx: Oropharynx is clear.   Eyes:      Extraocular Movements: Extraocular movements intact.      Pupils: Pupils  are equal, round, and reactive to light.   Cardiovascular:      Rate and Rhythm: Normal rate and regular rhythm.      Pulses: Normal pulses.   Pulmonary:      Effort: Pulmonary effort is normal.      Breath sounds: Normal breath sounds.   Abdominal:      General: Bowel sounds are normal.      Palpations: Abdomen is soft.      Tenderness: There is abdominal tenderness (Suprapubic). There is no right CVA tenderness, left CVA tenderness or rebound.   Musculoskeletal:         General: No swelling or tenderness. Normal range of motion.      Cervical back: Normal range of motion and neck supple.      Comments: Arthritic changes hands and knees noted bilateral  Chronic trigger points upper back and thigh area  Chronic low back pain with range of motion  Negative straight leg raises  Gait stable   Skin:     General: Skin is warm.      Findings: No lesion or rash.   Neurological:      General: No focal deficit present.      Mental Status: She is alert and oriented to person, place, and time.      Sensory: No sensory deficit.      Motor:  Weakness (Strength of upper and lower extremities decreased however equal bilateral) present.      Gait: Gait normal.   Psychiatric:         Mood and Affect: Mood normal.         Behavior: Behavior normal.         Thought Content: Thought content normal.         Judgment: Judgment normal.     Urine Dip Results:               Assessment/Plan:  Problem List Items Addressed This Visit    None    Labs obtained in office  Urine sent for culture  Cipro 500 b.i.d. times 10 days given  Mybetriq DC Will try VESIcare 5 mg daily; discussed with patient possibility of such medicines causing dizziness along with her Effexor.  Patient willing at this time to stop Effexor to see if symptoms improve  Carotid ultrasound ordered  Will continue follow-up with neurologist/ENT is scheduled   B12 shot given  Encouraged cane for stability  Continue blood pressure/blood sugar monitoring outpatient  Norco 7.5mg  #90  no refills given along with Valium 5 mg daily   Refuses colonoscopy  Follow-up pending lab results otherwise as noted    Follow up:   Post-Discharge Follow Up Appointments       Wednesday Aug 13, 2022    Return Patient Visit with Cleophus Molt, PA-C at  1:00 PM      Thursday Sep 11, 2022    Return Patient Visit with Cleophus Molt, PA-C at  2:30 PM      Tuesday Oct 07, 2022    Return Patient Visit with Landis Gandy, DO at 11:00 AM      Thursday Apr 09, 2023    Return Telephone Visit with Cleophus Molt, PA-C at  1:30 PM      Internal Medicine, Building A  [facility]  [address]  [place] [address]  [phone] Urology, [place], [place] [address]  [phone]           Seek medical attention for new or worsening symptoms.  Patient has been seen in this clinic within the last 3 years.     Joedy Eickhoff, PA-C          This note was partially created using MModal Fluency Direct system (voice recognition software) and is inherently subject to errors including those of syntax and "sound-alike" substitutions which may escape proofreading.  In such instances, original meaning may be extrapolated by contextual derivation.

## 2022-09-08 NOTE — Telephone Encounter (Signed)
Patient in office today for pain med refill

## 2022-09-11 ENCOUNTER — Ambulatory Visit (INDEPENDENT_AMBULATORY_CARE_PROVIDER_SITE_OTHER): Payer: Self-pay | Admitting: Physician Assistant

## 2022-09-17 ENCOUNTER — Other Ambulatory Visit (INDEPENDENT_AMBULATORY_CARE_PROVIDER_SITE_OTHER): Payer: Self-pay | Admitting: Physician Assistant

## 2022-09-17 DIAGNOSIS — G894 Chronic pain syndrome: Secondary | ICD-10-CM

## 2022-09-24 ENCOUNTER — Telehealth (INDEPENDENT_AMBULATORY_CARE_PROVIDER_SITE_OTHER): Payer: Self-pay | Admitting: Physician Assistant

## 2022-10-06 ENCOUNTER — Ambulatory Visit: Payer: Medicare Other | Attending: Physician Assistant | Admitting: Physician Assistant

## 2022-10-06 ENCOUNTER — Other Ambulatory Visit: Payer: Self-pay

## 2022-10-06 ENCOUNTER — Other Ambulatory Visit (INDEPENDENT_AMBULATORY_CARE_PROVIDER_SITE_OTHER): Payer: Self-pay | Admitting: Physician Assistant

## 2022-10-06 ENCOUNTER — Encounter (INDEPENDENT_AMBULATORY_CARE_PROVIDER_SITE_OTHER): Payer: Self-pay | Admitting: Physician Assistant

## 2022-10-06 VITALS — BP 140/80 | HR 78 | Temp 97.0°F | Ht 66.0 in | Wt 204.2 lb

## 2022-10-06 DIAGNOSIS — R5382 Chronic fatigue, unspecified: Secondary | ICD-10-CM | POA: Insufficient documentation

## 2022-10-06 DIAGNOSIS — I10 Essential (primary) hypertension: Secondary | ICD-10-CM | POA: Insufficient documentation

## 2022-10-06 DIAGNOSIS — G894 Chronic pain syndrome: Secondary | ICD-10-CM | POA: Insufficient documentation

## 2022-10-06 DIAGNOSIS — E1165 Type 2 diabetes mellitus with hyperglycemia: Secondary | ICD-10-CM | POA: Insufficient documentation

## 2022-10-06 DIAGNOSIS — F419 Anxiety disorder, unspecified: Secondary | ICD-10-CM | POA: Insufficient documentation

## 2022-10-06 DIAGNOSIS — G459 Transient cerebral ischemic attack, unspecified: Secondary | ICD-10-CM | POA: Insufficient documentation

## 2022-10-06 DIAGNOSIS — F32A Depression, unspecified: Secondary | ICD-10-CM | POA: Insufficient documentation

## 2022-10-06 DIAGNOSIS — E538 Deficiency of other specified B group vitamins: Secondary | ICD-10-CM | POA: Insufficient documentation

## 2022-10-06 DIAGNOSIS — R42 Dizziness and giddiness: Secondary | ICD-10-CM | POA: Insufficient documentation

## 2022-10-06 DIAGNOSIS — M47816 Spondylosis without myelopathy or radiculopathy, lumbar region: Secondary | ICD-10-CM | POA: Insufficient documentation

## 2022-10-06 IMAGING — MR MRI LUMBAR SPINE WITHOUT CONTRAST
6 series · 48 of 48 positions shown · IV contrast (gadolinium)
Comparison: Lumbar spine x-ray dated 09/08/2022.

﻿EXAM:  [id number]   MRI LUMBAR SPINE WITHOUT CONTRAST
INDICATION: 71-year-old female with chronic low back pain with radicular symptoms to right leg. No past history of malignancy, trauma or back surgery.
TECHNIQUE: Multiplanar, multisequential MRI of the lumbosacral spine was performed without gadolinium contrast.

[Series 7: T2 · sagittal · 5.0mm · 1.00mm/px · 7 of 15 slices shown (1 of 3)]
[im 1/15]
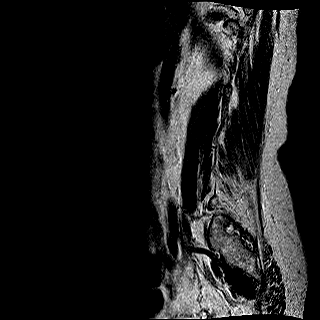
[im 3/15]
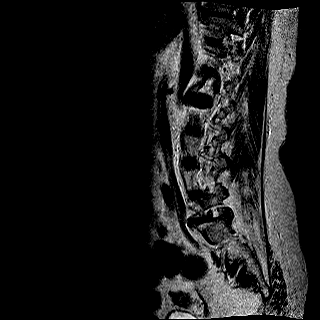
[im 5/15]
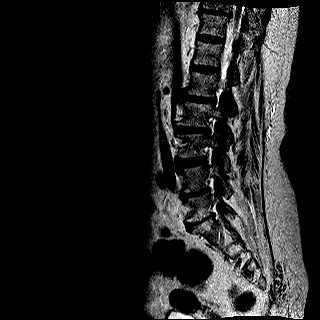
[im 8/15]
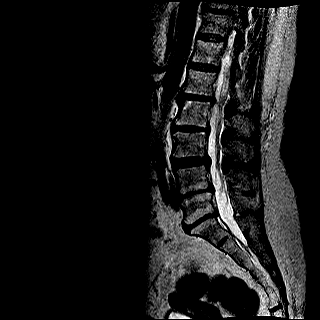
[im 10/15]
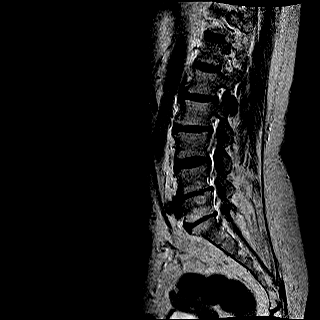
[im 12/15]
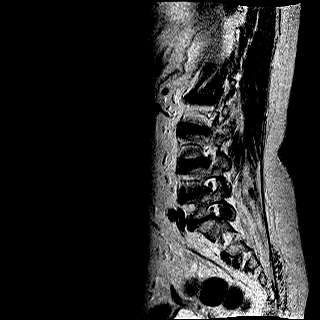
[im 15/15]
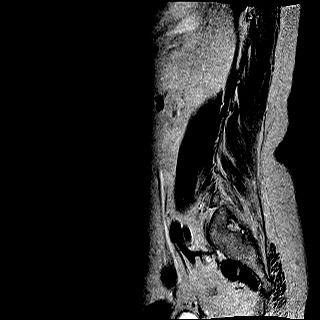

[Series 8: T1 · sagittal · 5.0mm · 1.00mm/px · 7 of 15 slices shown (1 of 2)]
[im 1/15]
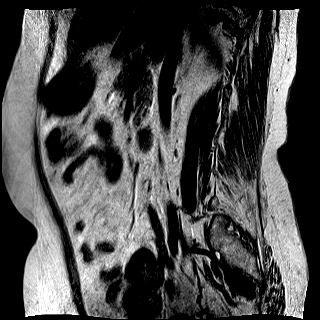
[im 3/15]
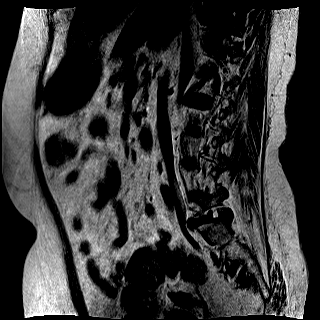
[im 5/15]
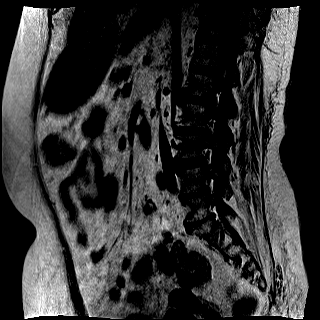
[im 8/15]
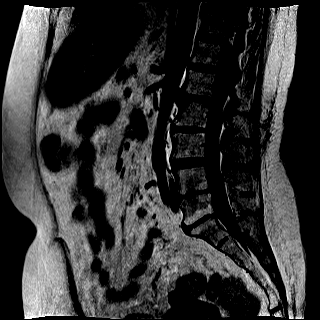
[im 10/15]
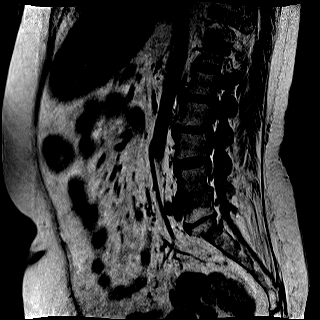
[im 12/15]
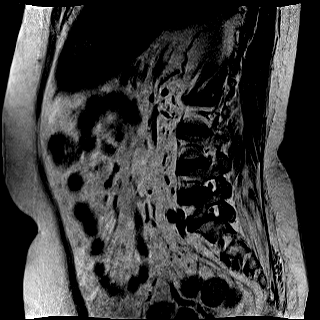
[im 15/15]
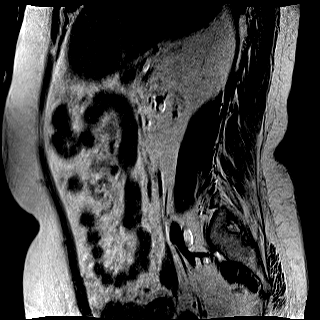

[Series 9: STIR · sagittal · 5.0mm · 1.25mm/px · 6 of 15 slices shown]
[im 1/15]
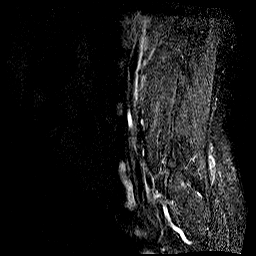
[im 3/15]
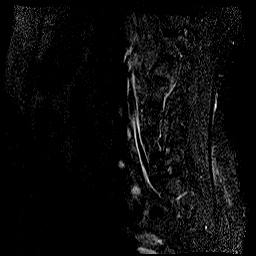
[im 6/15]
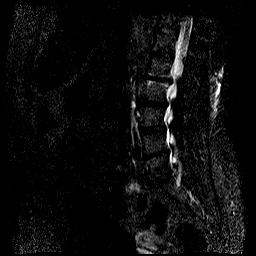
[im 9/15]
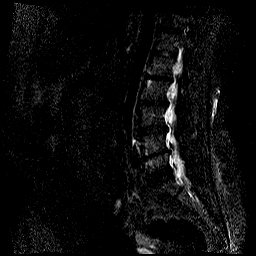
[im 12/15]
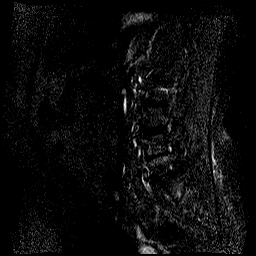
[im 15/15]
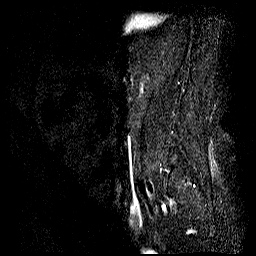

[Series 10: T2 · axial · 5.0mm · 0.89mm/px · z∈[-96,+85]mm · 10 of 25 slices shown (2 of 3)]
[im 1/25]
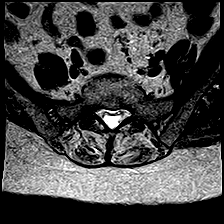
[im 3/25]
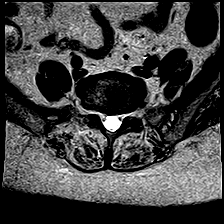
[im 6/25]
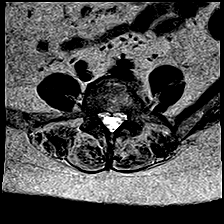
[im 9/25]
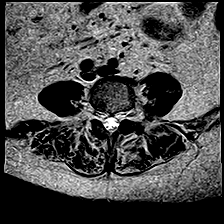
[im 11/25]
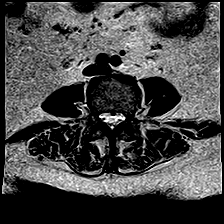
[im 14/25]
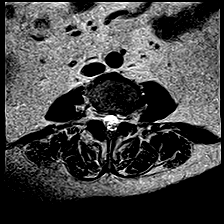
[im 17/25]
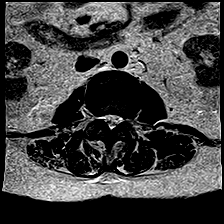
[im 19/25]
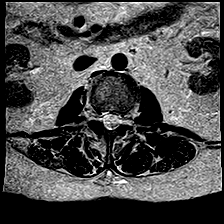
[im 22/25]
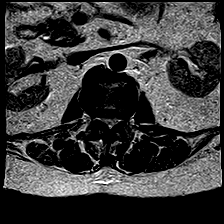
[im 25/25]
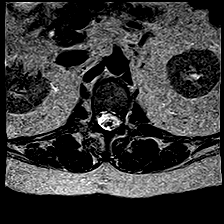

[Series 11: T1 · axial · 5.0mm · 0.89mm/px · z∈[-96,+85]mm · 10 of 25 slices shown (2 of 2)]
[im 1/25]
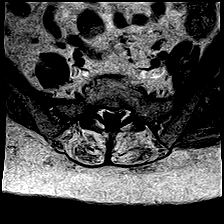
[im 3/25]
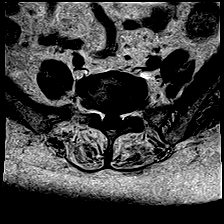
[im 6/25]
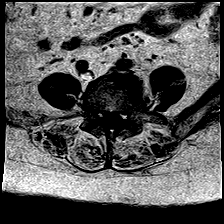
[im 9/25]
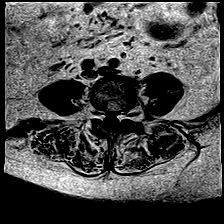
[im 11/25]
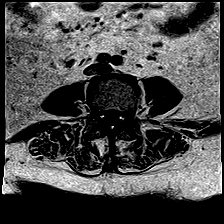
[im 14/25]
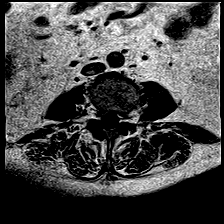
[im 17/25]
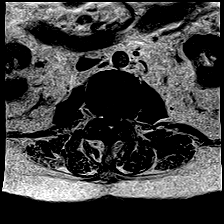
[im 19/25]
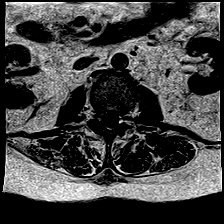
[im 22/25]
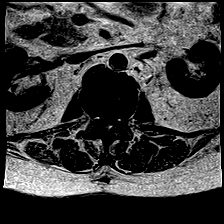
[im 25/25]
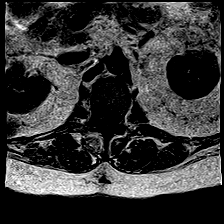

[Series 12: T2 · coronal · 4.0mm · 1.56mm/px · 8 of 20 slices shown (3 of 3)]
[im 1/20]
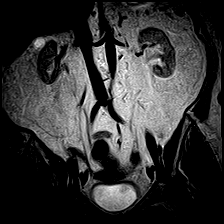
[im 3/20]
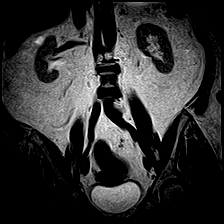
[im 6/20]
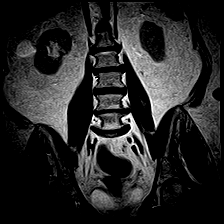
[im 9/20]
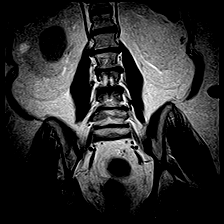
[im 11/20]
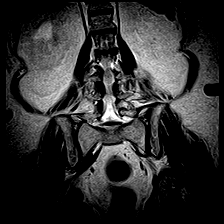
[im 14/20]
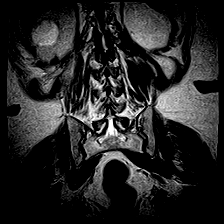
[im 17/20]
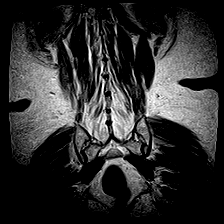
[im 20/20]
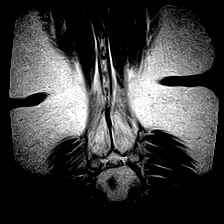

[48 of 48 positions shown; findings below may reference images not displayed]

FINDINGS: Overall visualization is slightly compromised in quality due to the body habitus and use of body coil instead of spine coil due to the body habitus.

No acute bony lesions of lumbar vertebrae are seen. 

At L1-2 level, degenerative disc disease with bulging annulus and facet arthropathy with hypertrophy of dorsal ligaments is causing moderately significant compromise of thecal sac in the midline measuring 6.7 mm in AP dimension.  Moderately significant compromise of both lateral recesses at this level. 

At L2-3 level, mild degenerative disc disease and facet arthropathy causing mild compromise of both lateral recesses. 

At L3-4 level, degenerative disc disease with bulging annulus and facet arthropathy are causing mild to moderate compromise of thecal sac and both lateral recesses with AP diameter in the midline measuring 9.1 mm. 

At L4-5 level, significant degenerative disc disease with bulging annulus causing mild to moderate compromise of both neural foramina.

At L5-S1 level, degenerative disc disease with bulging annulus is noted with minimal retrolisthesis. Mild compromise of both neural foramina due to the degenerative process.

Paravertebral soft tissues are unremarkable.
IMPRESSION: 1.  No acute bone changes of lumbar vertebrae. 

2.  At L1-2 level, degenerative disc disease with bulging annulus and facet arthropathy with hypertrophy of dorsal ligaments is causing moderately significant compromise of thecal sac in the midline measuring 6.7 mm in AP dimension.  Moderately significant compromise of both lateral recesses at this level. 

3. Findings at other disc levels are described above in detail.

4. Paravertebral soft tissues are unremarkable.

## 2022-10-06 MED ORDER — GLIMEPIRIDE 4 MG TABLET
ORAL_TABLET | ORAL | 1 refills | Status: DC
Start: 2022-10-06 — End: 2023-06-10

## 2022-10-06 MED ORDER — DIAZEPAM 5 MG TABLET
5.0000 mg | ORAL_TABLET | Freq: Every day | ORAL | 0 refills | Status: DC
Start: 2022-10-06 — End: 2022-11-03

## 2022-10-06 MED ORDER — CYANOCOBALAMIN (VIT B-12) 1,000 MCG/ML INJECTION SOLUTION
1000.0000 ug | Freq: Once | INTRAMUSCULAR | Status: AC
Start: 2022-10-06 — End: 2022-10-06
  Administered 2022-10-06: 1000 ug via INTRAMUSCULAR

## 2022-10-06 MED ORDER — SUMATRIPTAN 50 MG TABLET
50.0000 mg | ORAL_TABLET | Freq: Once | ORAL | 3 refills | Status: DC | PRN
Start: 2022-10-06 — End: 2022-12-24

## 2022-10-06 MED ORDER — SUMATRIPTAN 50 MG TABLET
ORAL_TABLET | ORAL | 1 refills | Status: DC
Start: 2022-10-06 — End: 2022-10-06

## 2022-10-06 NOTE — Progress Notes (Signed)
INTERNAL MEDICINE, BUILDING A  [address]  [place] [address]  Operated by [facility]  History and Physical     Name: Grace Hoffman MRN:  [id number]   Date: 10/06/2022 Age: 71 y.o.               Follow Up        Reason for Visit: Medication follow up (Has ha  and has vertigo since June/Wants b12)     History of Present Illness  KALISSA Hoffman is a 71 y.o. female who is being seen today in the office for f/u concerning blood pressure/hypertension currently 140/80.  She is currently taking Vasotec 2.5 mg daily as well as hydrochlorothiazide 25 mg daily p.r.n.Marland Kitchen   Denies chest pain.  She does continue to have chronic dizziness for which she is seeing the neurologist and has had a MRI of her brain which noted signs of TIAs.  She states she is currently doing physical therapy for the dizziness and gait issues which seems to be helping.  Occasional headaches noted also.  She has a follow-up with Dr. Aretha Parrot next week.  Note patient also has been seen by the ENT where she had her crystals realigned.  She states this helped a little bit but she feels like her balance is still an issue.  F/U BS w history of uncontrolled diabetes with hemoglobin A1c 7.3.  Currently patient is on metformin at 1000 mg twice daily along with glimepiride 4 mg twice daily.  Noted she was unable to tolerate being injectables such as Ozempic and Mounjaro due to GI side effects.  Denies increased thirst or urination.    Follow-up B12 deficiency with chronic fatigue issues noted.  Patient is requesting B12 injection today which do seem to help with her energy level.  Last B12 level when checked in lab stable however.    Follow-up anxiety/depression with patient currently on Valium 5 mg daily p.r.n. as well as Effexor 37.5 mg daily.  Patient denies panic attack and states anxiety depression overall stable at this current time.    Follow-up chronic pain/DJD/fibromyalgia for which she has been on chronic pain  therapy/narcotics for numerous years.  Patient had reported increased pain in her lower back with x-rays showing moderate DJD the MRI of spine ordered.  Patient states she is actually scheduled this afternoon to get the MRI.  She is currently taking Norco 7.5 mg q.6 hours p.r.n. which does help with her chronic pain issues.  She denies any side effects to medication and compliant w urine drug screen.  Requesting medication refill.      PHQ Questionnaire         Past Medical History:   Diagnosis Date    Anxiety     Chronic back pain     COVID-19 vaccine series completed     Depression     Diabetes mellitus, type 2 (CMS HCC)     Fibromyalgia affecting multiple sites     Hypertension     Hypomagnesemia     Insomnia disorder     Migraine     Mixed hyperlipidemia     Orthostatic hypotension     Osteoarthritis     Unspecified glaucoma(365.9)     Vitamin D deficiency          Past Surgical History:   Procedure Laterality Date    HX CHOLECYSTECTOMY      HX KNEE REPLACMENT Left     HX ROTATOR CUFF REPAIR  Left     SKIN CANCER EXCISION      forehead removed      Family Medical History:       Problem Relation (Age of Onset)    Kidney failure Mother    No Known Problems Sister, Brother, Half-Sister, Half-Brother, Maternal Aunt, Maternal Uncle, Paternal Aunt, Paternal Uncle, Maternal Grandmother, Maternal Grandfather, Paternal Grandmother, Paternal Grandfather, Daughter, Son    Respiratory Problems Father            Social History     Tobacco Use    Smoking status: Never    Smokeless tobacco: Never   Vaping Use    Vaping Use: Never used   Substance Use Topics    Alcohol use: Never    Drug use: Never     Medication:  d-mannose 500 mg Oral Capsule, Take 1 Capsule (500 mg total) by mouth Twice daily  enalapril (VASOTEC) 5 mg Oral Tablet, TAKE 1 TABLET BY MOUTH EVERY DAY  ergocalciferol, vitamin D2, (DRISDOL) 1,250 mcg (50,000 unit) Oral Capsule, Take 1 Capsule (50,000 Units total) by mouth Every 7 days  famotidine (PEPCID) 20 mg  Oral Tablet, TAKE 1 TABLET BY MOUTH TWICE DAILY (Patient taking differently: Take 1 Tablet (20 mg total) by mouth Every evening)  hydroCHLOROthiazide (HYDRODIURIL) 25 mg Oral Tablet, Take 1 Tablet (25 mg total) by mouth Once per day as needed  insulin glargine U-300 conc (TOUJEO MAX U-300 SOLOSTAR) 300 unit/mL (3 mL) Subcutaneous Insulin Pen, Inject 10 Units under the skin Every night  latanoprost (XALATAN) 0.005 % Ophthalmic Drops, Administer 1 Drop into the left eye Every night  Levetiracetam 500 mg Oral Tablet Sustained Release 24 hr, Take 1 Tablet (500 mg total) by mouth Every night for 90 days  magnesium oxide (MAG-OX) 400 mg Oral Tablet, Take 2 Tablets (800 mg total) by mouth Once a day  MetFORMIN (GLUCOPHAGE) 1,000 mg Oral Tablet, Take 1 Tablet (1,000 mg total) by mouth Twice daily with food  Pen Needle, Disposable, 32 gauge x 5/32" Needle, Use as directed once daily Indications: E11.9 Type 2 Diabetic  rosuvastatin (CRESTOR) 10 mg Oral Tablet, TAKE 1 TABLET BY MOUTH EVERY NIGHT AT BEDTIME  solifenacin (VESICARE) 5 mg Oral Tablet, TAKE 1 TABLET(5 MG) BY MOUTH EVERY DAY  venlafaxine (EFFEXOR XR) 37.5 mg Oral Capsule, Sust. Release 24 hr, Take 1 Capsule (37.5 mg total) by mouth Every night  diazePAM (VALIUM) 5 mg Oral Tablet, Take 1 Tablet (5 mg total) by mouth Once a day  glimepiride (AMARYL) 4 mg Oral Tablet, TAKE 1 TABLET(4 MG) BY MOUTH TWICE DAILY WITH A MEAL FOR DIABETES  SUMAtriptan (IMITREX) 50 mg Oral Tablet, sumatriptan 50 mg tablet   TAKE 1 TABLET BY MOUTH AT ONSET OF HEADACHE. IF NO RELIEF MAY REPEAT 1 AFTER AT LEAST 2 HOURS. MAX OF 4 TABS PER 24 HOURS    No facility-administered medications prior to visit.    Allergies:  Allergies   Allergen Reactions    Codeine Nausea/ Vomiting    Amoxicillin Diarrhea    Macrobid [Nitrofurantoin Monohyd/M-Cryst] Nausea/ Vomiting       Physical Exam:  Vitals:    10/06/22 1306   BP: (!) 140/80   Pulse: 78   Temp: 36.1 C (97 F)   TempSrc: Tympanic   SpO2: 95%    Weight: 92.6 kg (204 lb 3.2 oz)   Height: 1.676 m (5\' 6" )   BMI: 33.03      Physical Exam  Vitals and nursing note reviewed.  Constitutional:       Appearance: Normal appearance. She is obese.   HENT:      Right Ear: Tympanic membrane normal.      Left Ear: Tympanic membrane normal.      Mouth/Throat:      Mouth: Mucous membranes are moist.      Pharynx: Oropharynx is clear.   Eyes:      Extraocular Movements: Extraocular movements intact.      Pupils: Pupils are equal, round, and reactive to light.   Cardiovascular:      Rate and Rhythm: Normal rate and regular rhythm.      Pulses: Normal pulses.   Pulmonary:      Effort: Pulmonary effort is normal.      Breath sounds: Normal breath sounds.   Abdominal:      General: Bowel sounds are normal.      Palpations: Abdomen is soft.      Tenderness: There is no abdominal tenderness. There is no right CVA tenderness, left CVA tenderness or rebound.   Musculoskeletal:         General: No swelling or tenderness. Normal range of motion.      Cervical back: Normal range of motion and neck supple.      Comments: Arthritic changes hands and knees noted bilateral  Chronic trigger points upper back and thigh area  Chronic low back pain with range of motion  Negative straight leg raises  Gait stable   Skin:     General: Skin is warm.      Findings: No lesion or rash.   Neurological:      General: No focal deficit present.      Mental Status: She is alert and oriented to person, place, and time.      Sensory: No sensory deficit.      Motor: Weakness (Strength of upper and lower extremities decreased however equal bilateral) present.      Gait: Gait normal.   Psychiatric:         Mood and Affect: Mood normal.         Behavior: Behavior normal.         Thought Content: Thought content normal.         Judgment: Judgment normal.           Assessment/Plan:  Problem List Items Addressed This Visit          Cardiovascular System    Hypertension - Primary       Neurologic    Chronic pain     Mini stroke       Endocrine    Uncontrolled type 2 diabetes mellitus with hyperglycemia (CMS HCC)    B12 deficiency       Musculoskeletal    DJD (degenerative joint disease), lumbar       Psychiatric    Anxiety    Depression       Other    Dizziness    Fatigue     Labs up-to-date  Meds refilled as noted  Will get Senior flu shot at pharmacy    B12 shot given  Encouraged cane for stability  Continue blood pressure/blood sugar monitoring outpatient  Refuses colonoscopy  To follow-up with nephrologist/urologist as scheduled tomorrow  Will follow-up with neurologist next week    Follow up:   Post-Discharge Follow Up Appointments       Wednesday Aug 13, 2022    Return Patient Visit with Hyman Bible, Elyshia Kumagai, PA-C at  1:00  PM      Thursday Sep 11, 2022    Return Patient Visit with Bennie Dallas at  2:30 PM      Tuesday Oct 07, 2022    Return Patient Visit with Landis Gandy, DO at 11:00 AM      Thursday Apr 09, 2023    Return Telephone Visit with Cleophus Molt, PA-C at  1:30 PM      Internal Medicine, Building A  [facility]  [address]  [place] [address]  [phone] Urology, [place], [place] [address]  [phone]           Seek medical attention for new or worsening symptoms.  Patient has been seen in this clinic within the last 3 years.     Ryot Burrous, PA-C          This note was partially created using MModal Fluency Direct system (voice recognition software) and is inherently subject to errors including those of syntax and "sound-alike" substitutions which may escape proofreading.  In such instances, original meaning may be extrapolated by contextual derivation.

## 2022-10-07 ENCOUNTER — Encounter (INDEPENDENT_AMBULATORY_CARE_PROVIDER_SITE_OTHER): Payer: Self-pay | Admitting: Physician Assistant

## 2022-10-07 ENCOUNTER — Ambulatory Visit (INDEPENDENT_AMBULATORY_CARE_PROVIDER_SITE_OTHER): Payer: Medicare Other | Admitting: Student in an Organized Health Care Education/Training Program

## 2022-10-07 ENCOUNTER — Ambulatory Visit (INDEPENDENT_AMBULATORY_CARE_PROVIDER_SITE_OTHER): Payer: Medicare Other | Admitting: Nephrology

## 2022-10-07 ENCOUNTER — Encounter (INDEPENDENT_AMBULATORY_CARE_PROVIDER_SITE_OTHER): Payer: Self-pay | Admitting: Student in an Organized Health Care Education/Training Program

## 2022-10-07 ENCOUNTER — Encounter (INDEPENDENT_AMBULATORY_CARE_PROVIDER_SITE_OTHER): Payer: Self-pay | Admitting: Nephrology

## 2022-10-07 VITALS — BP 155/79 | HR 89 | Ht 64.0 in | Wt 204.0 lb

## 2022-10-07 VITALS — BP 174/91 | HR 81 | Ht 66.0 in | Wt 204.4 lb

## 2022-10-07 DIAGNOSIS — N3281 Overactive bladder: Secondary | ICD-10-CM

## 2022-10-07 DIAGNOSIS — I129 Hypertensive chronic kidney disease with stage 1 through stage 4 chronic kidney disease, or unspecified chronic kidney disease: Secondary | ICD-10-CM

## 2022-10-07 DIAGNOSIS — R899 Unspecified abnormal finding in specimens from other organs, systems and tissues: Secondary | ICD-10-CM

## 2022-10-07 DIAGNOSIS — R8271 Bacteriuria: Secondary | ICD-10-CM

## 2022-10-07 DIAGNOSIS — E119 Type 2 diabetes mellitus without complications: Secondary | ICD-10-CM

## 2022-10-07 DIAGNOSIS — N281 Cyst of kidney, acquired: Secondary | ICD-10-CM

## 2022-10-07 DIAGNOSIS — R32 Unspecified urinary incontinence: Secondary | ICD-10-CM

## 2022-10-07 DIAGNOSIS — E1165 Type 2 diabetes mellitus with hyperglycemia: Secondary | ICD-10-CM

## 2022-10-07 DIAGNOSIS — N390 Urinary tract infection, site not specified: Secondary | ICD-10-CM

## 2022-10-07 DIAGNOSIS — N182 Chronic kidney disease, stage 2 (mild): Secondary | ICD-10-CM

## 2022-10-07 DIAGNOSIS — E1122 Type 2 diabetes mellitus with diabetic chronic kidney disease: Secondary | ICD-10-CM

## 2022-10-07 DIAGNOSIS — I10 Essential (primary) hypertension: Secondary | ICD-10-CM

## 2022-10-07 MED ORDER — HYDROCODONE 7.5 MG-ACETAMINOPHEN 325 MG TABLET
1.0000 | ORAL_TABLET | Freq: Four times a day (QID) | ORAL | 0 refills | Status: DC | PRN
Start: 2022-10-07 — End: 2022-11-03

## 2022-10-07 MED ORDER — ENALAPRIL MALEATE 10 MG TABLET
10.0000 mg | ORAL_TABLET | Freq: Every day | ORAL | 1 refills | Status: DC
Start: 2022-10-07 — End: 2023-05-13

## 2022-10-07 NOTE — Addendum Note (Signed)
 Addended by: CATHRYNE IHA D on: 10/07/2022 02:38 PM     Modules accepted: Orders

## 2022-10-08 ENCOUNTER — Telehealth (INDEPENDENT_AMBULATORY_CARE_PROVIDER_SITE_OTHER): Payer: Self-pay | Admitting: Physician Assistant

## 2022-10-08 ENCOUNTER — Other Ambulatory Visit (INDEPENDENT_AMBULATORY_CARE_PROVIDER_SITE_OTHER): Payer: Self-pay | Admitting: Physician Assistant

## 2022-10-08 DIAGNOSIS — M5136 Other intervertebral disc degeneration, lumbar region: Secondary | ICD-10-CM

## 2022-10-08 DIAGNOSIS — M4726 Other spondylosis with radiculopathy, lumbar region: Secondary | ICD-10-CM

## 2022-10-09 ENCOUNTER — Telehealth (INDEPENDENT_AMBULATORY_CARE_PROVIDER_SITE_OTHER): Payer: Self-pay | Admitting: Physician Assistant

## 2022-10-21 ENCOUNTER — Other Ambulatory Visit (RURAL_HEALTH_CENTER): Payer: Self-pay | Admitting: Physician Assistant

## 2022-11-03 ENCOUNTER — Other Ambulatory Visit (INDEPENDENT_AMBULATORY_CARE_PROVIDER_SITE_OTHER): Payer: Self-pay | Admitting: Physician Assistant

## 2022-11-03 ENCOUNTER — Other Ambulatory Visit: Payer: Self-pay

## 2022-11-03 ENCOUNTER — Encounter (INDEPENDENT_AMBULATORY_CARE_PROVIDER_SITE_OTHER): Payer: Self-pay | Admitting: Physician Assistant

## 2022-11-03 ENCOUNTER — Ambulatory Visit: Payer: Medicare Other | Attending: Physician Assistant | Admitting: Physician Assistant

## 2022-11-03 VITALS — BP 118/78 | HR 74 | Temp 96.8°F | Ht 64.0 in | Wt 199.0 lb

## 2022-11-03 DIAGNOSIS — F32A Depression, unspecified: Secondary | ICD-10-CM

## 2022-11-03 DIAGNOSIS — E559 Vitamin D deficiency, unspecified: Secondary | ICD-10-CM

## 2022-11-03 DIAGNOSIS — E1165 Type 2 diabetes mellitus with hyperglycemia: Secondary | ICD-10-CM

## 2022-11-03 DIAGNOSIS — E538 Deficiency of other specified B group vitamins: Secondary | ICD-10-CM

## 2022-11-03 DIAGNOSIS — M5136 Other intervertebral disc degeneration, lumbar region: Secondary | ICD-10-CM

## 2022-11-03 DIAGNOSIS — F419 Anxiety disorder, unspecified: Secondary | ICD-10-CM

## 2022-11-03 DIAGNOSIS — M549 Dorsalgia, unspecified: Secondary | ICD-10-CM

## 2022-11-03 DIAGNOSIS — I129 Hypertensive chronic kidney disease with stage 1 through stage 4 chronic kidney disease, or unspecified chronic kidney disease: Secondary | ICD-10-CM

## 2022-11-03 DIAGNOSIS — G8929 Other chronic pain: Secondary | ICD-10-CM

## 2022-11-03 DIAGNOSIS — F418 Other specified anxiety disorders: Secondary | ICD-10-CM | POA: Insufficient documentation

## 2022-11-03 DIAGNOSIS — R5383 Other fatigue: Secondary | ICD-10-CM

## 2022-11-03 DIAGNOSIS — G894 Chronic pain syndrome: Secondary | ICD-10-CM

## 2022-11-03 DIAGNOSIS — R799 Abnormal finding of blood chemistry, unspecified: Secondary | ICD-10-CM

## 2022-11-03 DIAGNOSIS — E785 Hyperlipidemia, unspecified: Secondary | ICD-10-CM

## 2022-11-03 DIAGNOSIS — N182 Chronic kidney disease, stage 2 (mild): Secondary | ICD-10-CM

## 2022-11-03 DIAGNOSIS — I10 Essential (primary) hypertension: Secondary | ICD-10-CM

## 2022-11-03 DIAGNOSIS — Z79899 Other long term (current) drug therapy: Secondary | ICD-10-CM

## 2022-11-03 DIAGNOSIS — E1122 Type 2 diabetes mellitus with diabetic chronic kidney disease: Secondary | ICD-10-CM

## 2022-11-03 LAB — COMPREHENSIVE METABOLIC PNL, FASTING
ALBUMIN/GLOBULIN RATIO: 1.3 (ref 0.8–1.4)
ALBUMIN: 4.3 g/dL (ref 3.5–5.7)
ALKALINE PHOSPHATASE: 75 U/L (ref 34–104)
ALT (SGPT): 14 U/L (ref 7–52)
ANION GAP: 8 mmol/L (ref 4–13)
AST (SGOT): 20 U/L (ref 13–39)
BILIRUBIN TOTAL: 0.5 mg/dL (ref 0.3–1.2)
BUN/CREA RATIO: 16 (ref 6–22)
BUN: 17 mg/dL (ref 7–25)
CALCIUM, CORRECTED: 9.4 mg/dL (ref 8.9–10.8)
CALCIUM: 9.6 mg/dL (ref 8.6–10.3)
CHLORIDE: 105 mmol/L (ref 98–107)
CO2 TOTAL: 26 mmol/L (ref 21–31)
CREATININE: 1.08 mg/dL (ref 0.60–1.30)
ESTIMATED GFR: 54 mL/min/{1.73_m2} — ABNORMAL LOW (ref >59)
GLOBULIN: 3.4 (ref 2.9–5.4)
GLUCOSE: 148 mg/dL — ABNORMAL HIGH (ref 74–109)
OSMOLALITY, CALCULATED: 282 mOsm/kg (ref 270–290)
POTASSIUM: 4 mmol/L (ref 3.5–5.1)
PROTEIN TOTAL: 7.7 g/dL (ref 6.4–8.9)
SODIUM: 139 mmol/L (ref 136–145)

## 2022-11-03 LAB — MICROALBUMIN/CREATININE RATIO, URINE, RANDOM
CREATININE RANDOM URINE: 209 mg/dL — ABNORMAL HIGH (ref 11–26)
MICROALBUMIN RANDOM URINE: 1.8 mg/dL
MICROALBUMIN/CREATININE RATIO RANDOM URINE: 8.6 mg/g

## 2022-11-03 LAB — URINALYSIS, MACROSCOPIC
BILIRUBIN: NEGATIVE mg/dL
BLOOD: NEGATIVE mg/dL
GLUCOSE: NEGATIVE mg/dL
KETONES: NEGATIVE mg/dL
LEUKOCYTES: 75 WBCs/uL — AB
NITRITE: NEGATIVE
PH: 5.5 (ref 5.0–9.0)
PROTEIN: 20 mg/dL
SPECIFIC GRAVITY: 1.026 (ref 1.002–1.030)
UROBILINOGEN: NORMAL mg/dL

## 2022-11-03 LAB — CBC WITH DIFF
BASOPHIL #: 0 10*3/uL (ref 0.00–0.10)
BASOPHIL %: 1 % (ref 0–1)
EOSINOPHIL #: 0.2 10*3/uL (ref 0.00–0.50)
EOSINOPHIL %: 3 %
HCT: 42.4 % — ABNORMAL HIGH (ref 31.2–41.9)
HGB: 14.1 g/dL (ref 10.9–14.3)
LYMPHOCYTE #: 2.7 10*3/uL (ref 1.00–3.00)
LYMPHOCYTE %: 33 % (ref 16–44)
MCH: 32.9 pg — ABNORMAL HIGH (ref 24.7–32.8)
MCHC: 33.4 g/dL (ref 32.3–35.6)
MCV: 98.5 fL — ABNORMAL HIGH (ref 75.5–95.3)
MONOCYTE #: 0.6 10*3/uL (ref 0.30–1.00)
MONOCYTE %: 7 % (ref 5–13)
MPV: 11 fL — ABNORMAL HIGH (ref 7.9–10.8)
NEUTROPHIL #: 4.7 10*3/uL (ref 1.85–7.80)
NEUTROPHIL %: 57 % (ref 43–77)
PLATELETS: 221 10*3/uL (ref 140–440)
RBC: 4.3 10*6/uL (ref 3.63–4.92)
RDW: 13.1 % (ref 12.3–17.7)
WBC: 8.2 10*3/uL (ref 3.8–11.8)

## 2022-11-03 LAB — URINE DRUG SCREEN
AMPHET QL: NEGATIVE
BARB QL: NEGATIVE
BENZO QL: POSITIVE — AB
BUP QL: NEGATIVE
CANNAQL: NEGATIVE
COCQL: NEGATIVE
FENTANYL, RANDOM URINE: NEGATIVE
METHQL: NEGATIVE
OPIATE: POSITIVE — AB
OXYCODONE URINE: NEGATIVE
PCP QL: NEGATIVE

## 2022-11-03 LAB — VITAMIN D 25 TOTAL: VITAMIN D: 65 ng/mL (ref 30–100)

## 2022-11-03 LAB — URINALYSIS, MICROSCOPIC
HYALINE CASTS: 11 /lpf — ABNORMAL HIGH (ref <0)
RBCS: 4 /hpf — ABNORMAL HIGH (ref <4)
SQUAMOUS EPITHELIAL: 6 /hpf (ref <28)
WBC CASTS: 1 /lpf — ABNORMAL HIGH (ref <1)
WBCS: 10 /hpf — ABNORMAL HIGH (ref <6)

## 2022-11-03 LAB — IRON TRANSFERRIN AND TIBC
IRON (TRANSFERRIN) SATURATION: 33 % (ref 15–50)
IRON: 122 ug/dL (ref 50–212)
TOTAL IRON BINDING CAPACITY: 374 ug/dL (ref 250–450)
TRANSFERRIN: 267 mg/dL (ref 203–362)
UIBC: 252 ug/dL (ref 130–375)

## 2022-11-03 LAB — LIPID PANEL
CHOL/HDL RATIO: 4.3
CHOLESTEROL: 189 mg/dL (ref <200)
HDL CHOL: 44 mg/dL (ref 23–92)
LDL CALC: 113 mg/dL — ABNORMAL HIGH (ref 0–100)
TRIGLYCERIDES: 161 mg/dL — ABNORMAL HIGH (ref <=150)
VLDL CALC: 32 mg/dL (ref 0–50)

## 2022-11-03 LAB — THYROID STIMULATING HORMONE WITH FREE T4 REFLEX: TSH: 1.793 u[IU]/mL (ref 0.450–5.330)

## 2022-11-03 LAB — MAGNESIUM: MAGNESIUM: 1.8 mg/dL — ABNORMAL LOW (ref 1.9–2.7)

## 2022-11-03 MED ORDER — CYANOCOBALAMIN (VIT B-12) 1,000 MCG/ML INJECTION SOLUTION
1000.0000 ug | Freq: Once | INTRAMUSCULAR | Status: AC
Start: 2022-11-03 — End: 2022-11-03
  Administered 2022-11-03: 1000 ug via INTRAMUSCULAR

## 2022-11-03 MED ORDER — FLUTICASONE PROPIONATE 50 MCG/ACTUATION NASAL SPRAY,SUSPENSION
1.0000 | Freq: Every day | NASAL | 3 refills | Status: DC
Start: 2022-11-03 — End: 2022-12-10

## 2022-11-03 MED ORDER — LORATADINE 10 MG TABLET
10.0000 mg | ORAL_TABLET | Freq: Every day | ORAL | 3 refills | Status: DC | PRN
Start: 2022-11-03 — End: 2022-12-02

## 2022-11-03 MED ORDER — DIAZEPAM 5 MG TABLET
5.0000 mg | ORAL_TABLET | Freq: Every day | ORAL | 0 refills | Status: DC
Start: 2022-11-03 — End: 2022-12-02

## 2022-11-03 NOTE — Nursing Note (Signed)
Patient is here for routine 1 month follow up for medication refills.

## 2022-11-03 NOTE — Progress Notes (Signed)
[facility]  [address]  [place] [address]  Operated by [facility]  History and Physical     Name: Grace Hoffman MRN:  [id number]   Date: 11/03/2022 Age: 71 y.o.               Follow Up        Reason for Visit: Follow Up (Routine 1 month ) med refill lab work    History of Present Illness  Grace Hoffman is a 71 y.o. female who is being seen today in the office for f/u concerning blood pressure/hypertension currently  She is currently taking Vasotec 2.5 mg daily as well as hydrochlorothiazide 25 mg daily p.r.n.Marland Kitchen   Denies chest pain.  She does continue to have chronic dizziness for which she is seeing the neurologist and has had a MRI of her brain which noted signs of TIAs.  She states she is currently doing physical therapy for the dizziness and gait issues which seems to be helping.  Occasional headaches noted also.  She has a follow-up with Dr. Aretha Parrot .  Note patient also has been seen by the ENT where she had her crystals realigned.  She states this helped a little bit but she feels like her balance is still an issue.    F/U BS w history of uncontrolled diabetes with hemoglobin A1c 8.0.  Currently patient is on metformin at 1000 mg twice daily Toujeo 10 units at night along with glimepiride 4 mg twice daily.  Noted she was unable to tolerate being injectables such as Ozempic and Mounjaro due to GI side effects.  I have also tried to give her several other medications such as [place] which patient did not take due to reported side effects in literature.  She states when she checks her sugar home it is always low.  Denies increased thirst or urination.    Follow-up B12 deficiency with chronic fatigue issues noted.  Patient is requesting B12 injection today which do seem to help with her energy level.  Last B12 level when checked in lab stable however.    Follow-up cholesterol/lipids for which patient is currently on Crestor 10 mg nightly.  She  denies any side effects or issues with medication.    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  Patient does have recurrent UTI history but currently has no burning frequency odor noted.  She follows with urologist as well as nephrologist. Follow-up labs needed.    Follow-up hypo magnesium with last level 1.8.  Patient denies any leg cramps in his currently taking magnesium 400 mg 2 daily.    Follow-up anxiety/depression with patient currently on Valium 5 mg daily p.r.n. as well as Effexor 37.5 mg daily.  Patient denies panic attack and states anxiety depression overall stable at this current time.    Follow-up chronic pain/DJD/fibromyalgia for which she has been on chronic pain therapy/narcotics for numerous years.  Patient had reported increased pain in her lower back with x-rays showing moderate DJD and MRI obtained.  She is seeing Dr. Laurell [place] in [place] concerning her back.  She is currently taking Norco 7.5 mg q.6 hours p.r.n. which does help with her chronic pain issues.  She denies any side effects to medication and compliant w urine drug screen.  Requesting medication refill.    Flu vaccine up-to-date    PHQ Questionnaire  Little interest or pleasure in doing things.: Not at all  Feeling down,  depressed, or hopeless: Not at all  PHQ 2 Total: 0  Trouble falling or staying asleep, or sleeping too much.: Not at all  Feeling tired or having little energy: Not at all  Poor appetite or overeating: Not at all  Feeling bad about yourself/ that you are a failure in the past 2 weeks?: Not at all  Trouble concentrating on things in the past 2 weeks?: Not at all  Moving/Speaking slowly or being fidgety or restless  in the past 2 weeks?: Not at all  Thoughts that you would be better off DEAD, or of hurting yourself in some way.: Not at all  If you checked off any problems, how difficult have these problems made it for you to do your work, take care of things at home, or get along with other  people?: Not difficult at all  PHQ 9 Total: 0  Interpretation of Total Score: 0-4 No depression      Past Medical History:   Diagnosis Date    Anxiety     Chronic back pain     COVID-19 vaccine series completed     Depression     Diabetes mellitus, type 2 (CMS HCC)     Fibromyalgia affecting multiple sites     Hypertension     Hypomagnesemia     Insomnia disorder     Migraine     Mixed hyperlipidemia     Orthostatic hypotension     Osteoarthritis     TIA (transient ischemic attack)     Unspecified glaucoma(365.9)     Vitamin D deficiency          Past Surgical History:   Procedure Laterality Date    HX CHOLECYSTECTOMY      HX KNEE REPLACMENT Left     HX ROTATOR CUFF REPAIR Left     SKIN CANCER EXCISION      forehead removed      Family Medical History:       Problem Relation (Age of Onset)    Kidney failure Mother    No Known Problems Sister, Brother, Half-Sister, Half-Brother, Maternal Aunt, Maternal Uncle, Paternal Aunt, Paternal Uncle, Maternal Grandmother, Maternal Grandfather, Paternal Grandmother, Paternal Grandfather, Daughter, Son    Respiratory Problems Father            Social History     Tobacco Use    Smoking status: Never    Smokeless tobacco: Never   Vaping Use    Vaping Use: Never used   Substance Use Topics    Alcohol use: Never    Drug use: Never     Medication:  d-mannose 500 mg Oral Capsule, Take 1 Capsule (500 mg total) by mouth Twice daily  enalapril maleate (VASOTEC) 10 mg Oral Tablet, Take 1 Tablet (10 mg total) by mouth Once a day for 180 days  ergocalciferol, vitamin D2, (DRISDOL) 1,250 mcg (50,000 unit) Oral Capsule, Take 1 Capsule (50,000 Units total) by mouth Every 7 days  famotidine (PEPCID) 20 mg Oral Tablet, TAKE 1 TABLET BY MOUTH TWICE DAILY (Patient taking differently: Take 1 Tablet (20 mg total) by mouth Every evening)  glimepiride (AMARYL) 4 mg Oral Tablet, TAKE 1 TABLET(4 MG) BY MOUTH TWICE DAILY WITH A MEAL FOR DIABETES  hydroCHLOROthiazide (HYDRODIURIL) 25 mg Oral Tablet, Take 1  Tablet (25 mg total) by mouth Once per day as needed  latanoprost (XALATAN) 0.005 % Ophthalmic Drops, Administer 1 Drop into the left eye Every night  Levetiracetam 500 mg Oral Tablet Sustained  Release 24 hr, Take 1 Tablet (500 mg total) by mouth Every night for 90 days  magnesium oxide (MAG-OX) 400 mg Oral Tablet, Take 2 Tablets (800 mg total) by mouth Once a day  MetFORMIN (GLUCOPHAGE) 1,000 mg Oral Tablet, Take 1 Tablet (1,000 mg total) by mouth Twice daily with food  rosuvastatin (CRESTOR) 10 mg Oral Tablet, TAKE 1 TABLET BY MOUTH EVERY NIGHT AT BEDTIME  solifenacin (VESICARE) 5 mg Oral Tablet, TAKE 1 TABLET(5 MG) BY MOUTH EVERY DAY  SUMAtriptan (IMITREX) 50 mg Oral Tablet, Take 1 Tablet (50 mg total) by mouth Once, as needed for Migraine for up to 1 dose TAKE 1 TABLET BY MOUTH AT ONSET OF HEADACHE. IF NO RELIEF MAY REPEAT 1 AFTER AT LEAST 2 HOURS. MAX OF 4 TABS PER 24 HOURS  TOUJEO MAX U-300 SOLOSTAR 300 unit/mL (3 mL) Subcutaneous Insulin Pen, ADMINISTER 10 UNITS UNDER THE SKIN EVERY NIGHT  venlafaxine (EFFEXOR XR) 37.5 mg Oral Capsule, Sust. Release 24 hr, Take 1 Capsule (37.5 mg total) by mouth Every night  diazePAM (VALIUM) 5 mg Oral Tablet, Take 1 Tablet (5 mg total) by mouth Once a day  HYDROcodone-acetaminophen (NORCO) 7.5-325 mg Oral Tablet, Take 1 Tablet by mouth Every 6 hours as needed for up to 30 days Indications: pain  Pen Needle, Disposable, 32 gauge x 5/32" Needle, Use as directed once daily Indications: E11.9 Type 2 Diabetic    No facility-administered medications prior to visit.    Allergies:  Allergies   Allergen Reactions    Codeine Nausea/ Vomiting    Amoxicillin Diarrhea    Macrobid [Nitrofurantoin Monohyd/M-Cryst] Nausea/ Vomiting       Physical Exam:  Vitals:    11/03/22 1042   BP: 118/78   Pulse: 74   Temp: 36 C (96.8 F)   TempSrc: Tympanic   SpO2: 98%   Weight: 90.3 kg (199 lb)   Height: 1.626 m (5\' 4" )   BMI: 34.23      Physical Exam  Vitals and nursing note reviewed.    Constitutional:       Appearance: Normal appearance. She is obese.   HENT:      Right Ear: Tympanic membrane normal.      Left Ear: Tympanic membrane normal.      Mouth/Throat:      Mouth: Mucous membranes are moist.      Pharynx: Oropharynx is clear.   Eyes:      Extraocular Movements: Extraocular movements intact.      Pupils: Pupils are equal, round, and reactive to light.   Cardiovascular:      Rate and Rhythm: Normal rate and regular rhythm.      Pulses: Normal pulses.   Pulmonary:      Effort: Pulmonary effort is normal.      Breath sounds: Normal breath sounds.   Abdominal:      General: Bowel sounds are normal.      Palpations: Abdomen is soft.      Tenderness: There is no abdominal tenderness. There is no right CVA tenderness, left CVA tenderness or rebound.   Musculoskeletal:         General: No swelling or tenderness. Normal range of motion.      Cervical back: Normal range of motion and neck supple.      Comments: Arthritic changes hands and knees noted bilateral  Chronic trigger points upper back and thigh area  Chronic low back pain with range of motion  Negative straight leg raises  Gait stable   Skin:     General: Skin is warm.      Findings: No lesion or rash.   Neurological:      General: No focal deficit present.      Mental Status: She is alert and oriented to person, place, and time.      Sensory: No sensory deficit.      Motor: Weakness (Strength of upper and lower extremities decreased however equal bilateral) present.      Gait: Gait normal.   Psychiatric:         Mood and Affect: Mood normal.         Behavior: Behavior normal.         Thought Content: Thought content normal.         Judgment: Judgment normal.           Assessment/Plan:  Problem List Items Addressed This Visit          Cardiovascular System    Hypertension - Primary    Relevant Orders    COMPREHENSIVE METABOLIC PNL, FASTING (Completed)    Hyperlipidemia    Relevant Orders    LIPID PANEL (Completed)       Neurologic     Chronic pain       Nephrology    Chronic kidney disease (CKD), stage II (mild)    Relevant Orders    COMPREHENSIVE METABOLIC PNL, FASTING (Completed)       Endocrine    Uncontrolled type 2 diabetes mellitus with hyperglycemia (CMS HCC)    Relevant Orders    COMPREHENSIVE METABOLIC PNL, FASTING (Completed)    HGA1C (HEMOGLOBIN A1C WITH EST AVG GLUCOSE) (Completed)    URINALYSIS, MACROSCOPIC AND MICROSCOPIC W/CULTURE REFLEX (Completed)    MICROALBUMIN/CREATININE RATIO, URINE, RANDOM (Completed)    URINE CULTURE,ROUTINE (Completed)    B12 deficiency    Relevant Orders    CBC/DIFF (Completed)    Vitamin D deficiency    Relevant Orders    VITAMIN D 25 TOTAL (Completed)       Musculoskeletal    Chronic back pain    Bulging of lumbar intervertebral disc       Psychiatric    Anxiety    Depression       Other    Hypomagnesemia    Relevant Orders    MAGNESIUM (Completed)    Long-term use of high-risk medication    Relevant Orders    URINE DRUG SCREEN (Completed)    Fatigue    Relevant Orders    CBC/DIFF (Completed)    COMPREHENSIVE METABOLIC PNL, FASTING (Completed)    THYROID STIMULATING HORMONE WITH FREE T4 REFLEX (Completed)    IRON TRANSFERRIN AND TIBC (Completed)     Other Visit Diagnoses       Abnormal finding of blood chemistry, unspecified        Relevant Orders    IRON TRANSFERRIN AND TIBC (Completed)            Labs obtained in office   Meds refilled as noted   B12 shot given  Flu shot up-to-date  Continue diet  Continue blood pressure/blood sugar monitoring outpatient  Refuses colonoscopy  To follow-up with nephrologist/urologist/neurosurgeon/neurologist as scheduled  Follow-up pending lab results otherwise is scheduled    Post-Discharge Follow Up Appointments       Wednesday Dec 24, 2022    Hospital Follow Up with Cleophus Molt, PA-C at  9:00 AM      Tuesday Dec 30, 2022  Return Patient Visit with Cleophus Molt, PA-C at  1:30 PM      Thursday Apr 02, 2023    Return Patient Visit with Richardean Sale, MD at  1:45 PM       Thursday Apr 09, 2023    Return Telephone Visit with Cleophus Molt, PA-C at  1:30 PM      Friday Oct 09, 2023    Return Patient Visit with Landis Gandy, DO at  2:00 PM      [facility], [facility]  [facility]  [address]  [place] [address]  [phone] Nephrology, [place], [place] [address]  [phone] Urology, [place], [place] [address]  [phone]              Seek medical attention for new or worsening symptoms.  Patient has been seen in this clinic within the last 3 years.     Amy Lavone Orn, PA-C      I personally reviewed the documentation of care provided by the certified physician assistant and I agree with her medical decision making, Feliberto Harts, D.O.       This note was partially created using MModal Fluency Direct system (voice recognition software) and is inherently subject to errors including those of syntax and "sound-alike" substitutions which may escape proofreading.  In such instances, original meaning may be extrapolated by contextual derivation.

## 2022-11-03 NOTE — Telephone Encounter (Signed)
Patient is in office for routine 1 month follow up for medication refills.

## 2022-11-04 ENCOUNTER — Other Ambulatory Visit (RURAL_HEALTH_CENTER): Payer: Self-pay | Admitting: Family Medicine

## 2022-11-04 ENCOUNTER — Ambulatory Visit (INDEPENDENT_AMBULATORY_CARE_PROVIDER_SITE_OTHER): Payer: Medicare Other | Admitting: Physician Assistant

## 2022-11-04 ENCOUNTER — Telehealth (INDEPENDENT_AMBULATORY_CARE_PROVIDER_SITE_OTHER): Payer: Self-pay | Admitting: Physician Assistant

## 2022-11-04 LAB — HGA1C (HEMOGLOBIN A1C WITH EST AVG GLUCOSE): HEMOGLOBIN A1C: 8 % — ABNORMAL HIGH (ref 4.0–6.0)

## 2022-11-04 MED ORDER — HYDROCODONE 5 MG-ACETAMINOPHEN 325 MG TABLET
1.0000 | ORAL_TABLET | Freq: Three times a day (TID) | ORAL | 0 refills | Status: DC
Start: 2022-11-04 — End: 2022-12-02

## 2022-11-04 MED ORDER — HYDROCODONE 7.5 MG-ACETAMINOPHEN 325 MG TABLET
1.0000 | ORAL_TABLET | Freq: Four times a day (QID) | ORAL | 0 refills | Status: DC | PRN
Start: 2022-11-04 — End: 2022-11-04

## 2022-11-04 NOTE — Progress Notes (Signed)
FYI.    Pharmacy called the office. Unfortunately, they are out of hydrocodone 10 and hydrocodone 7.5mg . Will send in hydrocodone 5mg  po tid instead

## 2022-11-05 LAB — URINE CULTURE,ROUTINE: URINE CULTURE: 10000 — AB

## 2022-11-06 ENCOUNTER — Other Ambulatory Visit (INDEPENDENT_AMBULATORY_CARE_PROVIDER_SITE_OTHER): Payer: Self-pay | Admitting: Physician Assistant

## 2022-11-06 DIAGNOSIS — R7989 Other specified abnormal findings of blood chemistry: Secondary | ICD-10-CM

## 2022-11-06 DIAGNOSIS — R849 Unspecified abnormal finding in specimens from respiratory organs and thorax: Secondary | ICD-10-CM

## 2022-11-06 DIAGNOSIS — R809 Proteinuria, unspecified: Secondary | ICD-10-CM

## 2022-11-19 ENCOUNTER — Other Ambulatory Visit: Payer: Medicare Other

## 2022-11-19 ENCOUNTER — Other Ambulatory Visit: Payer: Self-pay

## 2022-11-19 DIAGNOSIS — R270 Ataxia, unspecified: Secondary | ICD-10-CM | POA: Insufficient documentation

## 2022-11-22 LAB — VITAMIN E, SERUM
ALPHA-TOCOPHEROL: 12.8 mg/L (ref 5.7–19.9)
BETA-GAMMA TOCOPHEROL: 1.3 mg/L (ref <=4.3)

## 2022-11-23 LAB — GAD65 AB ASSAY, S: GAD-65 ANTIBODY: <5 IU/mL (ref <5)

## 2022-11-28 LAB — QUEST MISC ORDER - REFRIGERATED

## 2022-12-02 ENCOUNTER — Other Ambulatory Visit: Payer: Self-pay

## 2022-12-02 ENCOUNTER — Encounter (INDEPENDENT_AMBULATORY_CARE_PROVIDER_SITE_OTHER): Payer: Self-pay | Admitting: Physician Assistant

## 2022-12-02 ENCOUNTER — Ambulatory Visit: Payer: Medicare Other | Attending: Physician Assistant | Admitting: Physician Assistant

## 2022-12-02 ENCOUNTER — Other Ambulatory Visit (INDEPENDENT_AMBULATORY_CARE_PROVIDER_SITE_OTHER): Payer: Self-pay | Admitting: Physician Assistant

## 2022-12-02 VITALS — BP 128/84 | HR 80 | Temp 96.8°F | Ht 64.0 in | Wt 206.0 lb

## 2022-12-02 DIAGNOSIS — Z79899 Other long term (current) drug therapy: Secondary | ICD-10-CM | POA: Insufficient documentation

## 2022-12-02 DIAGNOSIS — N182 Chronic kidney disease, stage 2 (mild): Secondary | ICD-10-CM | POA: Insufficient documentation

## 2022-12-02 DIAGNOSIS — E1165 Type 2 diabetes mellitus with hyperglycemia: Secondary | ICD-10-CM | POA: Insufficient documentation

## 2022-12-02 DIAGNOSIS — I10 Essential (primary) hypertension: Secondary | ICD-10-CM | POA: Insufficient documentation

## 2022-12-02 DIAGNOSIS — F39 Unspecified mood [affective] disorder: Secondary | ICD-10-CM | POA: Insufficient documentation

## 2022-12-02 DIAGNOSIS — M797 Fibromyalgia: Secondary | ICD-10-CM | POA: Insufficient documentation

## 2022-12-02 DIAGNOSIS — Z7984 Long term (current) use of oral hypoglycemic drugs: Secondary | ICD-10-CM | POA: Insufficient documentation

## 2022-12-02 DIAGNOSIS — Z87440 Personal history of urinary (tract) infections: Secondary | ICD-10-CM | POA: Insufficient documentation

## 2022-12-02 DIAGNOSIS — N390 Urinary tract infection, site not specified: Secondary | ICD-10-CM | POA: Insufficient documentation

## 2022-12-02 DIAGNOSIS — E538 Deficiency of other specified B group vitamins: Secondary | ICD-10-CM | POA: Insufficient documentation

## 2022-12-02 DIAGNOSIS — F419 Anxiety disorder, unspecified: Secondary | ICD-10-CM | POA: Insufficient documentation

## 2022-12-02 DIAGNOSIS — E1122 Type 2 diabetes mellitus with diabetic chronic kidney disease: Secondary | ICD-10-CM | POA: Insufficient documentation

## 2022-12-02 DIAGNOSIS — I129 Hypertensive chronic kidney disease with stage 1 through stage 4 chronic kidney disease, or unspecified chronic kidney disease: Secondary | ICD-10-CM | POA: Insufficient documentation

## 2022-12-02 DIAGNOSIS — M545 Low back pain, unspecified: Secondary | ICD-10-CM | POA: Insufficient documentation

## 2022-12-02 DIAGNOSIS — F32A Depression, unspecified: Secondary | ICD-10-CM | POA: Insufficient documentation

## 2022-12-02 DIAGNOSIS — Z794 Long term (current) use of insulin: Secondary | ICD-10-CM | POA: Insufficient documentation

## 2022-12-02 DIAGNOSIS — G8929 Other chronic pain: Secondary | ICD-10-CM | POA: Insufficient documentation

## 2022-12-02 MED ORDER — PEN NEEDLE, DIABETIC 32 GAUGE X 5/32"
1 refills | Status: DC
Start: 2022-12-02 — End: 2022-12-06

## 2022-12-02 MED ORDER — LORATADINE 10 MG TABLET
10.0000 mg | ORAL_TABLET | Freq: Every day | ORAL | 3 refills | Status: DC | PRN
Start: 2022-12-02 — End: 2024-02-24

## 2022-12-02 MED ORDER — CYANOCOBALAMIN (VIT B-12) 1,000 MCG/ML INJECTION SOLUTION
1000.0000 ug | Freq: Once | INTRAMUSCULAR | Status: AC
Start: 2022-12-02 — End: 2022-12-02
  Administered 2022-12-02: 1000 ug via INTRAMUSCULAR

## 2022-12-02 MED ORDER — DIAZEPAM 5 MG TABLET
5.0000 mg | ORAL_TABLET | Freq: Every day | ORAL | 0 refills | Status: DC
Start: 2022-12-02 — End: 2022-12-24

## 2022-12-02 NOTE — Nursing Note (Signed)
Patient is here for routine 1 month follow up for medication refills.

## 2022-12-02 NOTE — Progress Notes (Signed)
INTERNAL MEDICINE, BUILDING A  [address]  [place] [address]  Operated by [facility]  History and Physical     Name: Grace Hoffman MRN:  [id number]   Date: 12/02/2022 Age: 71 y.o.               Follow Up        Reason for Visit: Follow Up (Routine 1 month ), B 12 Injection, and Urine Test     History of Present Illness  Grace Hoffman is a 71 y.o. female who is being seen today in the office for f/u concerning blood pressure/hypertension currently 128/84.  She is currently taking Vasotec 10 mg daily as well as hydrochlorothiazide 25 mg daily p.r.n.Marland Kitchen   Denies chest pain.  She does continue to have chronic dizziness for which she is seeing the neurologist and has had a MRI of her brain which noted signs of TIAs.  She states she is currently doing physical therapy for the dizziness and gait issues which seems to be helping.  Occasional headaches noted also.  She has a follow-up with Dr. Aretha Parrot scheduled.  Note patient also has been seen by the ENT where she had her crystals realigned.  She states this helped a little bit but she feels like her balance is still an issue.    F/U BS w history of uncontrolled diabetes with last hemoglobin 8.0 which was up from previous A1c 7.3.  Currently patient is on metformin at 1000 mg twice daily along with glimepiride 4 mg twice daily and most recently added Toujeo 10 units nightly.  Noted she was unable to tolerate being injectables such as Ozempic and Mounjaro due to GI side effects.  States blood sugar is a little bit better and  Denies increased thirst or urination.    Follow-up B12 deficiency with chronic fatigue issues noted.  Patient is requesting B12 injection today which do seem to help with her energy level.  Last B12 level when checked in lab stable however.    Follow-up recurrent UTI which patient at this time denies any urinary symptoms.  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  Last GFR was 54.  Follow-up urinalysis needed today.    Follow-up chronic pain/DJD/fibromyalgia for which she has been on chronic pain therapy/narcotics for numerous years.  Patient had reported increased pain in her lower back with x-rays showing moderate DJD the MRI of spine ordered.  She did have an MRI was referred to [facility] neurosurgeon who she states she has an appointment on December 26 to obtain injections in her back area.  She also is currently taking Norco 7.5 mg q.6 hours p.r.n. which does help with her chronic pain issues.  She denies any side effects to medication and compliant w urine drug screen.  Requesting medication refill.    Follow-up anxiety/depression with patient currently on Valium 5 mg daily p.r.n. as well as Effexor 37.5 mg daily.  Patient denies panic attack and states anxiety depression overall stable at this current time.    PHQ Questionnaire         Past Medical History:   Diagnosis Date    Anxiety     Chronic back pain     COVID-19 vaccine series completed     Depression     Diabetes mellitus, type 2 (CMS HCC)     Fibromyalgia affecting multiple sites  Hypertension     Hypomagnesemia     Insomnia disorder     Migraine     Mixed hyperlipidemia     Orthostatic hypotension     Osteoarthritis     Unspecified glaucoma(365.9)     Vitamin D deficiency          Past Surgical History:   Procedure Laterality Date    HX CHOLECYSTECTOMY      HX KNEE REPLACMENT Left     HX ROTATOR CUFF REPAIR Left     SKIN CANCER EXCISION      forehead removed      Family Medical History:       Problem Relation (Age of Onset)    Kidney failure Mother    No Known Problems Sister, Brother, Half-Sister, Half-Brother, Maternal Aunt, Maternal Uncle, Paternal Aunt, Paternal Uncle, Maternal Grandmother, Maternal Grandfather, Paternal Grandmother, Paternal Grandfather, Daughter, Son    Respiratory Problems Father            Social History     Tobacco Use    Smoking status:  Never    Smokeless tobacco: Never   Vaping Use    Vaping Use: Never used   Substance Use Topics    Alcohol use: Never    Drug use: Never     Medication:  d-mannose 500 mg Oral Capsule, Take 1 Capsule (500 mg total) by mouth Twice daily  enalapril maleate (VASOTEC) 10 mg Oral Tablet, Take 1 Tablet (10 mg total) by mouth Once a day for 180 days  ergocalciferol, vitamin D2, (DRISDOL) 1,250 mcg (50,000 unit) Oral Capsule, Take 1 Capsule (50,000 Units total) by mouth Every 7 days  famotidine (PEPCID) 20 mg Oral Tablet, TAKE 1 TABLET BY MOUTH TWICE DAILY (Patient taking differently: Take 1 Tablet (20 mg total) by mouth Every evening)  fluticasone propionate (FLONASE) 50 mcg/actuation Nasal Spray, Suspension, Administer 1 Spray into each nostril Once a day for 30 days  glimepiride (AMARYL) 4 mg Oral Tablet, TAKE 1 TABLET(4 MG) BY MOUTH TWICE DAILY WITH A MEAL FOR DIABETES  hydroCHLOROthiazide (HYDRODIURIL) 25 mg Oral Tablet, Take 1 Tablet (25 mg total) by mouth Once per day as needed  latanoprost (XALATAN) 0.005 % Ophthalmic Drops, Administer 1 Drop into the left eye Every night  Levetiracetam 500 mg Oral Tablet Sustained Release 24 hr, Take 1 Tablet (500 mg total) by mouth Every night for 90 days  magnesium oxide (MAG-OX) 400 mg Oral Tablet, Take 2 Tablets (800 mg total) by mouth Once a day  MetFORMIN (GLUCOPHAGE) 1,000 mg Oral Tablet, Take 1 Tablet (1,000 mg total) by mouth Twice daily with food  rosuvastatin (CRESTOR) 10 mg Oral Tablet, TAKE 1 TABLET BY MOUTH EVERY NIGHT AT BEDTIME  solifenacin (VESICARE) 5 mg Oral Tablet, TAKE 1 TABLET(5 MG) BY MOUTH EVERY DAY  SUMAtriptan (IMITREX) 50 mg Oral Tablet, Take 1 Tablet (50 mg total) by mouth Once, as needed for Migraine for up to 1 dose TAKE 1 TABLET BY MOUTH AT ONSET OF HEADACHE. IF NO RELIEF MAY REPEAT 1 AFTER AT LEAST 2 HOURS. MAX OF 4 TABS PER 24 HOURS  TOUJEO MAX U-300 SOLOSTAR 300 unit/mL (3 mL) Subcutaneous Insulin Pen, ADMINISTER 10 UNITS UNDER THE SKIN EVERY  NIGHT  venlafaxine (EFFEXOR XR) 37.5 mg Oral Capsule, Sust. Release 24 hr, Take 1 Capsule (37.5 mg total) by mouth Every night  diazePAM (VALIUM) 5 mg Oral Tablet, Take 1 Tablet (5 mg total) by mouth Once a day  HYDROcodone-acetaminophen (NORCO) 5-325 mg Oral  Tablet, Take 1 Tablet by mouth Three times a day Indications: pain, Supply issue. Previous dose not available  loratadine (CLARITIN) 10 mg Oral Tablet, Take 1 Tablet (10 mg total) by mouth Once per day as needed  Pen Needle, Disposable, 32 gauge x 5/32" Needle, Use as directed once daily Indications: E11.9 Type 2 Diabetic    No facility-administered medications prior to visit.    Allergies:  Allergies   Allergen Reactions    Codeine Nausea/ Vomiting    Amoxicillin Diarrhea    Macrobid [Nitrofurantoin Monohyd/M-Cryst] Nausea/ Vomiting       Physical Exam:  Vitals:    12/02/22 1315   BP: 128/84   Pulse: 80   Temp: 36 C (96.8 F)   TempSrc: Tympanic   SpO2: 95%   Weight: 93.4 kg (206 lb)   Height: 1.626 m (5\' 4" )   BMI: 35.43      Physical Exam  Vitals and nursing note reviewed.   Constitutional:       Appearance: Normal appearance. She is obese.   HENT:      Right Ear: Tympanic membrane normal.      Left Ear: Tympanic membrane normal.      Mouth/Throat:      Mouth: Mucous membranes are moist.      Pharynx: Oropharynx is clear.   Eyes:      Extraocular Movements: Extraocular movements intact.      Pupils: Pupils are equal, round, and reactive to light.   Cardiovascular:      Rate and Rhythm: Normal rate and regular rhythm.      Pulses: Normal pulses.   Pulmonary:      Effort: Pulmonary effort is normal.      Breath sounds: Normal breath sounds.   Abdominal:      General: Bowel sounds are normal.      Palpations: Abdomen is soft.      Tenderness: There is no abdominal tenderness. There is no right CVA tenderness, left CVA tenderness or rebound.   Musculoskeletal:         General: No swelling or tenderness. Normal range of motion.      Cervical back: Normal range  of motion and neck supple.      Comments: Arthritic changes hands and knees noted bilateral  Chronic trigger points upper back and thigh area  Chronic low back pain with range of motion  Negative straight leg raises  Gait stable   Skin:     General: Skin is warm.      Findings: No lesion or rash.   Neurological:      General: No focal deficit present.      Mental Status: She is alert and oriented to person, place, and time.      Sensory: No sensory deficit.      Motor: Weakness (Strength of upper and lower extremities decreased however equal bilateral) present.      Gait: Gait normal.   Psychiatric:         Mood and Affect: Mood normal.         Behavior: Behavior normal.         Thought Content: Thought content normal.         Judgment: Judgment normal.       Urine Dip Results:   Time collected: 1520  Glucose (Ref Range: Negative mg/dL): Negative  Bilirubin (Ref Range: Negative mg/dL): Negative  Ketones (Ref Range: Negative mg/dL): (!) Trace (5 mg/dl)  Urine Specific Gravity (Ref Range:  1.005 - 1.030): 1.025  Blood (urine) (Ref Range: Negative mg/dL): Negative  pH (Ref Range: 5.0 - 8.0): 5.0  Protein (Ref Range: Negative mg/dL): (!) 1+ (30mg /dL)  Urobilinogen (Ref Range: Normal): 0.2mg /dL (Normal)  Nitrite (Ref Range: Negative): Negative  Leukocytes (Ref Range: Negative WBC's/uL): (!) Small         Assessment/Plan:  Problem List Items Addressed This Visit          Cardiovascular System    Essential hypertension - Primary       Nephrology    Recurrent UTI    Relevant Orders    POCT URINE DIPSTICK    Chronic kidney disease (CKD), stage II (mild)       Endocrine    Uncontrolled type 2 diabetes mellitus with hyperglycemia (CMS HCC)    B12 deficiency       Musculoskeletal    Chronic back pain       Psychiatric    Anxiety       Labs up-to-date  Urine sent for culture  Continue increase hydration/hygiene  Meds refilled as noted   B12 shot given  Continue blood pressure/blood sugar monitoring outpatient  Refuses  colonoscopy  To follow-up with nephrologist/urologist /neurologist as scheduled   Follow-up with neurosurgeon  December 26 to obtain spinal injections    Post-Discharge Follow Up Appointments       Tuesday Dec 30, 2022    Return Patient Visit with Jan 01, 2023, PA-C at  1:30 PM      Thursday Apr 02, 2023    Return Patient Visit with Apr 04, 2023, MD at  1:45 PM      Thursday Apr 09, 2023    Return Telephone Visit with Apr 11, 2023, PA-C at  1:30 PM      Friday Oct 09, 2023    Return Patient Visit with Oct 11, 2023, DO at  2:00 PM      Internal Medicine, Building A  Building Arlana Hove  [address]  [place]  [phone] Nephrology, [facility] Professional [facility] Professional [place], [place]  [address]  [place]  [phone] Urology, [facility] Professional [facility] Professional [place], [place]  [address]  [place]  [phone]           Seek medical attention for new or worsening symptoms.  Patient has been seen in this clinic within the last 3 years.     Nicolo Tomko, PA-C          This note was partially created using MModal Fluency Direct system (voice recognition software) and is inherently subject to errors including those of syntax and "sound-alike" substitutions which may escape proofreading.  In such instances, original meaning may be extrapolated by contextual derivation.

## 2022-12-02 NOTE — Telephone Encounter (Unsigned)
Patient is here for routine 1 month follow up for medication refills.

## 2022-12-02 NOTE — Nursing Note (Signed)
12/02/22 1500   Urine test  (Siemens Multistix 10 SG)   Performed Status: Manual   Time collected 1520   Color (Ref Range: Yellow) (!) Light Yellow   Glucose (Ref Range: Negative mg/dL) Negative   Bilirubin (Ref Range: Negative mg/dL) Negative   Ketones (Ref Range: Negative mg/dL) (!) Trace (5 mg/dl)   Urine Specific Gravity (Ref Range: 1.005 - 1.030) 1.025   Blood (urine) (Ref Range: Negative mg/dL) Negative   pH (Ref Range: 5.0 - 8.0) 5.0   Protein (Ref Range: Negative mg/dL) (!) 1+ (30mg /dL)   Urobilinogen (Ref Range: Normal) 0.2mg /dL (Normal)   Nitrite (Ref Range: Negative) Negative   Leukocytes (Ref Range: Negative WBC's/uL) (!) 1+   Initials KD,CMA

## 2022-12-03 MED ORDER — HYDROCODONE 7.5 MG-ACETAMINOPHEN 325 MG TABLET
1.0000 | ORAL_TABLET | Freq: Four times a day (QID) | ORAL | 0 refills | Status: DC | PRN
Start: 2022-12-03 — End: 2022-12-24

## 2022-12-05 ENCOUNTER — Emergency Department (HOSPITAL_BASED_OUTPATIENT_CLINIC_OR_DEPARTMENT_OTHER): Payer: Medicare Other

## 2022-12-05 ENCOUNTER — Other Ambulatory Visit: Payer: Self-pay

## 2022-12-05 ENCOUNTER — Encounter (HOSPITAL_BASED_OUTPATIENT_CLINIC_OR_DEPARTMENT_OTHER): Payer: Self-pay

## 2022-12-05 ENCOUNTER — Inpatient Hospital Stay
Admission: EM | Admit: 2022-12-05 | Discharge: 2022-12-10 | DRG: 177 | Disposition: A | Discharge status: Home / Self Care | Payer: Medicare Other | Attending: INTERNAL MEDICINE | Admitting: INTERNAL MEDICINE

## 2022-12-05 ENCOUNTER — Other Ambulatory Visit (INDEPENDENT_AMBULATORY_CARE_PROVIDER_SITE_OTHER): Payer: Self-pay

## 2022-12-05 ENCOUNTER — Other Ambulatory Visit (INDEPENDENT_AMBULATORY_CARE_PROVIDER_SITE_OTHER): Payer: Self-pay | Admitting: Physician Assistant

## 2022-12-05 DIAGNOSIS — N179 Acute kidney failure, unspecified: Secondary | ICD-10-CM | POA: Diagnosis present

## 2022-12-05 DIAGNOSIS — E782 Mixed hyperlipidemia: Secondary | ICD-10-CM | POA: Diagnosis present

## 2022-12-05 DIAGNOSIS — Z794 Long term (current) use of insulin: Secondary | ICD-10-CM

## 2022-12-05 DIAGNOSIS — M1990 Unspecified osteoarthritis, unspecified site: Secondary | ICD-10-CM | POA: Diagnosis present

## 2022-12-05 DIAGNOSIS — E119 Type 2 diabetes mellitus without complications: Secondary | ICD-10-CM | POA: Diagnosis present

## 2022-12-05 DIAGNOSIS — Z85828 Personal history of other malignant neoplasm of skin: Secondary | ICD-10-CM

## 2022-12-05 DIAGNOSIS — G4700 Insomnia, unspecified: Secondary | ICD-10-CM | POA: Diagnosis present

## 2022-12-05 DIAGNOSIS — M797 Fibromyalgia: Secondary | ICD-10-CM | POA: Diagnosis present

## 2022-12-05 DIAGNOSIS — N390 Urinary tract infection, site not specified: Secondary | ICD-10-CM

## 2022-12-05 DIAGNOSIS — R55 Syncope and collapse: Secondary | ICD-10-CM | POA: Diagnosis present

## 2022-12-05 DIAGNOSIS — Z7951 Long term (current) use of inhaled steroids: Secondary | ICD-10-CM

## 2022-12-05 DIAGNOSIS — U071 COVID-19: Principal | ICD-10-CM | POA: Diagnosis present

## 2022-12-05 DIAGNOSIS — E872 Acidosis, unspecified: Secondary | ICD-10-CM | POA: Diagnosis present

## 2022-12-05 DIAGNOSIS — E860 Dehydration: Secondary | ICD-10-CM | POA: Diagnosis present

## 2022-12-05 DIAGNOSIS — E1122 Type 2 diabetes mellitus with diabetic chronic kidney disease: Secondary | ICD-10-CM | POA: Diagnosis present

## 2022-12-05 DIAGNOSIS — E1165 Type 2 diabetes mellitus with hyperglycemia: Secondary | ICD-10-CM | POA: Diagnosis present

## 2022-12-05 DIAGNOSIS — M47816 Spondylosis without myelopathy or radiculopathy, lumbar region: Secondary | ICD-10-CM | POA: Diagnosis present

## 2022-12-05 DIAGNOSIS — R42 Dizziness and giddiness: Secondary | ICD-10-CM | POA: Diagnosis present

## 2022-12-05 DIAGNOSIS — F39 Unspecified mood [affective] disorder: Secondary | ICD-10-CM | POA: Diagnosis present

## 2022-12-05 DIAGNOSIS — N182 Chronic kidney disease, stage 2 (mild): Secondary | ICD-10-CM | POA: Diagnosis present

## 2022-12-05 DIAGNOSIS — I10 Essential (primary) hypertension: Secondary | ICD-10-CM

## 2022-12-05 DIAGNOSIS — Z8673 Personal history of transient ischemic attack (TIA), and cerebral infarction without residual deficits: Secondary | ICD-10-CM

## 2022-12-05 DIAGNOSIS — F419 Anxiety disorder, unspecified: Secondary | ICD-10-CM | POA: Diagnosis present

## 2022-12-05 DIAGNOSIS — J9601 Acute respiratory failure with hypoxia: Secondary | ICD-10-CM | POA: Diagnosis present

## 2022-12-05 DIAGNOSIS — I129 Hypertensive chronic kidney disease with stage 1 through stage 4 chronic kidney disease, or unspecified chronic kidney disease: Secondary | ICD-10-CM | POA: Diagnosis present

## 2022-12-05 DIAGNOSIS — K529 Noninfective gastroenteritis and colitis, unspecified: Secondary | ICD-10-CM | POA: Diagnosis present

## 2022-12-05 DIAGNOSIS — G43909 Migraine, unspecified, not intractable, without status migrainosus: Secondary | ICD-10-CM | POA: Diagnosis present

## 2022-12-05 DIAGNOSIS — Z7984 Long term (current) use of oral hypoglycemic drugs: Secondary | ICD-10-CM

## 2022-12-05 DIAGNOSIS — Z79899 Other long term (current) drug therapy: Secondary | ICD-10-CM

## 2022-12-05 DIAGNOSIS — M545 Low back pain, unspecified: Secondary | ICD-10-CM

## 2022-12-05 HISTORY — DX: Transient cerebral ischemic attack, unspecified: G45.9

## 2022-12-05 LAB — COMPREHENSIVE METABOLIC PANEL, NON-FASTING
ALBUMIN/GLOBULIN RATIO: 0.8 (ref 0.8–1.4)
ALBUMIN: 3.4 g/dL (ref 3.4–5.0)
ALKALINE PHOSPHATASE: 81 U/L (ref 46–116)
ALT (SGPT): 15 U/L (ref <=78)
ANION GAP: 14 mmol/L — ABNORMAL HIGH (ref 4–13)
AST (SGOT): 19 U/L (ref 15–37)
BILIRUBIN TOTAL: 0.5 mg/dL (ref 0.2–1.0)
BUN/CREA RATIO: 12
BUN: 15 mg/dL (ref 7–18)
CALCIUM, CORRECTED: 10.2 mg/dL
CALCIUM: 9.7 mg/dL (ref 8.5–10.1)
CHLORIDE: 99 mmol/L (ref 98–107)
CO2 TOTAL: 22 mmol/L (ref 21–32)
CREATININE: 1.29 mg/dL — ABNORMAL HIGH (ref 0.55–1.02)
ESTIMATED GFR: 44 mL/min/{1.73_m2} — ABNORMAL LOW (ref >59)
GLOBULIN: 4.4
GLUCOSE: 235 mg/dL — ABNORMAL HIGH (ref 74–106)
OSMOLALITY, CALCULATED: 279 mOsm/kg (ref 270–290)
POTASSIUM: 3.9 mmol/L (ref 3.5–5.1)
PROTEIN TOTAL: 7.8 g/dL (ref 6.4–8.2)
SODIUM: 135 mmol/L — ABNORMAL LOW (ref 136–145)

## 2022-12-05 LAB — VENOUS BLOOD GAS/LACTATE
%FIO2 (VENOUS): 21 %
BICARBONATE (VENOUS): 25.1 mmol/L (ref 22.0–26.0)
LACTATE: 4.1 mmol/L (ref <=2.0)
PCO2 (VENOUS): 46 mm/Hg (ref 41–51)
PH (VENOUS): 7.34 (ref 7.31–7.41)
PO2 (VENOUS): 18 mm/Hg — CL (ref 35–50)

## 2022-12-05 LAB — COVID-19, FLU A/B, RSV RAPID BY PCR
INFLUENZA VIRUS TYPE A: NOT DETECTED
INFLUENZA VIRUS TYPE B: NOT DETECTED
RESPIRATORY SYNCTIAL VIRUS (RSV): NOT DETECTED
SARS-CoV-2: DETECTED — AB

## 2022-12-05 LAB — CBC WITH DIFF
BASOPHIL #: 0.02 10*3/uL (ref 0.00–0.30)
BASOPHIL %: 0 % (ref 0–3)
EOSINOPHIL #: 0.03 10*3/uL (ref 0.00–0.80)
EOSINOPHIL %: 0 % (ref 0–7)
HCT: 42.5 % (ref 37.0–47.0)
HGB: 14.2 g/dL (ref 12.5–16.0)
LYMPHOCYTE #: 1.34 10*3/uL (ref 1.10–5.00)
LYMPHOCYTE %: 18 % — ABNORMAL LOW (ref 25–45)
MCH: 33.8 pg — ABNORMAL HIGH (ref 27.0–32.0)
MCHC: 33.5 g/dL (ref 32.0–36.0)
MCV: 101 fL — ABNORMAL HIGH (ref 78.0–99.0)
MONOCYTE #: 0.54 10*3/uL (ref 0.00–1.30)
MONOCYTE %: 7 % (ref 0–12)
MPV: 9 fL (ref 7.4–10.4)
NEUTROPHIL #: 5.64 10*3/uL (ref 1.80–8.40)
NEUTROPHIL %: 75 % (ref 40–76)
PLATELETS: 181 10*3/uL (ref 140–440)
RBC: 4.21 10*6/uL (ref 4.20–5.40)
RDW: 15.5 % — ABNORMAL HIGH (ref 11.6–14.8)
WBC: 7.6 10*3/uL (ref 4.0–10.5)

## 2022-12-05 LAB — POC BLOOD GLUCOSE (RESULTS): GLUCOSE, POC: 181 mg/dl (ref 50–500)

## 2022-12-05 LAB — MAGNESIUM: MAGNESIUM: 1.3 mg/dL — ABNORMAL LOW (ref 1.8–2.4)

## 2022-12-05 LAB — LACTIC ACID LEVEL W/ REFLEX FOR LEVEL >2.0: LACTIC ACID: 4.9 mmol/L (ref 0.4–2.0)

## 2022-12-05 MED ORDER — SODIUM CHLORIDE 0.9 % IV BOLUS
1000.0000 mL | INJECTION | Status: AC
Start: 2022-12-05 — End: 2022-12-06
  Administered 2022-12-05: 1000 mL via INTRAVENOUS
  Administered 2022-12-06: 0 mL via INTRAVENOUS

## 2022-12-05 MED ORDER — MAGNESIUM SULFATE 1 GRAM/100 ML IN DEXTROSE 5 % INTRAVENOUS PIGGYBACK
INJECTION | INTRAVENOUS | Status: AC
Start: 2022-12-05 — End: 2022-12-05
  Filled 2022-12-05: qty 300

## 2022-12-05 MED ORDER — SODIUM CHLORIDE 0.9 % INTRAVENOUS SOLUTION
500.0000 mg | INTRAVENOUS | Status: DC
Start: 2022-12-06 — End: 2022-12-10
  Administered 2022-12-05: 500 mg via INTRAVENOUS
  Administered 2022-12-06: 0 mg via INTRAVENOUS
  Administered 2022-12-07: 500 mg via INTRAVENOUS
  Administered 2022-12-07 – 2022-12-08 (×2): 0 mg via INTRAVENOUS
  Administered 2022-12-08 (×2): 500 mg via INTRAVENOUS
  Administered 2022-12-09 – 2022-12-10 (×2): 0 mg via INTRAVENOUS
  Administered 2022-12-10: 500 mg via INTRAVENOUS
  Filled 2022-12-05 (×5): qty 5

## 2022-12-05 MED ORDER — SODIUM CHLORIDE 0.9 % INTRAVENOUS PIGGYBACK
1.0000 g | INTRAVENOUS | Status: AC
Start: 2022-12-05 — End: 2022-12-05
  Administered 2022-12-05: 1 g via INTRAVENOUS
  Administered 2022-12-05: 0 g via INTRAVENOUS

## 2022-12-05 MED ORDER — SODIUM CHLORIDE 0.9 % (FLUSH) INJECTION SYRINGE
3.0000 mL | INJECTION | INTRAMUSCULAR | Status: DC | PRN
Start: 2022-12-05 — End: 2022-12-10

## 2022-12-05 MED ORDER — MAGNESIUM SULFATE 1 GRAM/100 ML IN DEXTROSE 5 % INTRAVENOUS PIGGYBACK
1.0000 g | INJECTION | Freq: Once | INTRAVENOUS | Status: AC
Start: 2022-12-06 — End: 2022-12-05
  Administered 2022-12-05: 0 g via INTRAVENOUS
  Administered 2022-12-05: 1 g via INTRAVENOUS

## 2022-12-05 MED ORDER — SODIUM CHLORIDE 0.9 % INTRAVENOUS PIGGYBACK
1.0000 g | INTRAVENOUS | Status: DC
Start: 2022-12-06 — End: 2022-12-10
  Administered 2022-12-06: 1 g via INTRAVENOUS
  Administered 2022-12-06 – 2022-12-07 (×2): 0 g via INTRAVENOUS
  Administered 2022-12-07 – 2022-12-08 (×3): 1 g via INTRAVENOUS
  Administered 2022-12-08 – 2022-12-10 (×3): 0 g via INTRAVENOUS
  Administered 2022-12-10: 1 g via INTRAVENOUS
  Filled 2022-12-05 (×4): qty 10

## 2022-12-05 MED ORDER — MAGNESIUM SULFATE 1 GRAM/100 ML IN DEXTROSE 5 % INTRAVENOUS PIGGYBACK
1.0000 g | INJECTION | Freq: Once | INTRAVENOUS | Status: AC
Start: 2022-12-06 — End: 2022-12-05
  Administered 2022-12-05: 1 g via INTRAVENOUS
  Administered 2022-12-05: 0 g via INTRAVENOUS

## 2022-12-05 MED ORDER — CEFTRIAXONE 1 GRAM SOLUTION FOR INJECTION
INTRAMUSCULAR | Status: AC
Start: 2022-12-05 — End: 2022-12-05
  Filled 2022-12-05: qty 10

## 2022-12-05 MED ORDER — MAGNESIUM SULFATE 1 GRAM/100 ML IN DEXTROSE 5 % INTRAVENOUS PIGGYBACK
1.0000 g | INJECTION | Freq: Once | INTRAVENOUS | Status: AC
Start: 2022-12-06 — End: 2022-12-06
  Administered 2022-12-06: 0 g via INTRAVENOUS
  Administered 2022-12-06: 1 g via INTRAVENOUS

## 2022-12-05 MED ORDER — SODIUM CHLORIDE 0.9 % (FLUSH) INJECTION SYRINGE
3.0000 mL | INJECTION | Freq: Three times a day (TID) | INTRAMUSCULAR | Status: DC
Start: 2022-12-05 — End: 2022-12-10
  Administered 2022-12-05: 0 mL
  Administered 2022-12-06 (×3): 3 mL
  Administered 2022-12-07 (×2): 0 mL
  Administered 2022-12-07: 3 mL
  Administered 2022-12-08 (×2): 0 mL
  Administered 2022-12-08: 3 mL
  Administered 2022-12-09 (×2): 0 mL
  Administered 2022-12-09 – 2022-12-10 (×2): 3 mL

## 2022-12-05 MED ORDER — ONDANSETRON HCL (PF) 4 MG/2 ML INJECTION SOLUTION
INTRAMUSCULAR | Status: AC
Start: 2022-12-05 — End: 2022-12-05
  Filled 2022-12-05: qty 2

## 2022-12-05 MED ORDER — ONDANSETRON HCL (PF) 4 MG/2 ML INJECTION SOLUTION
4.0000 mg | INTRAMUSCULAR | Status: AC
Start: 2022-12-05 — End: 2022-12-05
  Administered 2022-12-05: 4 mg via INTRAVENOUS

## 2022-12-05 MED ORDER — PANTOPRAZOLE 40 MG INTRAVENOUS SOLUTION
40.0000 mg | INTRAVENOUS | Status: AC
Start: 2022-12-05 — End: 2022-12-05
  Administered 2022-12-05: 40 mg via INTRAVENOUS

## 2022-12-05 MED ORDER — SODIUM CHLORIDE 0.9 % IV BOLUS
1000.0000 mL | INJECTION | Status: AC
Start: 2022-12-05 — End: 2022-12-05
  Administered 2022-12-05: 0 mL via INTRAVENOUS
  Administered 2022-12-05: 1000 mL via INTRAVENOUS

## 2022-12-05 MED ORDER — FAMOTIDINE (PF) 20 MG/2 ML INTRAVENOUS SOLUTION
INTRAVENOUS | Status: AC
Start: 2022-12-05 — End: 2022-12-05
  Filled 2022-12-05: qty 2

## 2022-12-05 MED ORDER — FAMOTIDINE (PF) 20 MG/2 ML INTRAVENOUS SOLUTION
20.0000 mg | INTRAVENOUS | Status: AC
Start: 2022-12-05 — End: 2022-12-05
  Administered 2022-12-05: 20 mg via INTRAVENOUS

## 2022-12-05 MED ORDER — PANTOPRAZOLE 40 MG INTRAVENOUS SOLUTION
INTRAVENOUS | Status: AC
Start: 2022-12-05 — End: 2022-12-05
  Filled 2022-12-05: qty 10

## 2022-12-05 MED ORDER — AZITHROMYCIN 500 MG INTRAVENOUS SOLUTION
INTRAVENOUS | Status: AC
Start: 2022-12-05 — End: 2022-12-05
  Filled 2022-12-05: qty 5

## 2022-12-05 NOTE — ED Triage Notes (Signed)
Pt reports " think i have a UTI." + diarrhea, nausea X 1 day . Hx of frequent UTIs . Denies fever at home

## 2022-12-05 NOTE — ED Provider Notes (Signed)
[facility] Emergency Department  ED Primary Provider Note  History of Present Illness   Chief Complaint   Patient presents with    Back Pain    Diarrhea    Nausea     Arrival: The patient arrived by Car  Grace Hoffman is a 71 y.o. female who had concerns including Back Pain, Diarrhea, and Nausea. Nvd dysuria. States hx of vertigo since June off on had mri and ct scant comm rad told TIA. States gets UTI often and gets nv. Denies, had, dizziness, nor flank pain denies hx of stones.     Review of Systems   Constitutional: No fever, chills or weakness   Skin: No rash or diaphoresis  HENT: No headaches, or congestion  Eyes: No vision changes or photophobia   Cardio: No chest pain, palpitations or leg swelling   Respiratory: No cough, wheezing or SOB  GI:  + nausea, vomiting  stool changes  GU:  + dysuria, no hematuria, or increased frequency  MSK: No muscle aches, joint or back pain  Neuro: No seizures, LOC, numbness, tingling, or focal weakness  Psychiatric: No depression, SI or substance abuse  All other systems reviewed and are negative.    Historical Data   History Reviewed This Encounter: all noted and reviewed     Physical Exam   ED Triage Vitals [12/05/22 2134]   BP (Non-Invasive) (!) 159/83   Heart Rate 95   Respiratory Rate 20   Temperature 37.1 C (98.7 F)   SpO2 97 %   Weight 89.4 kg (197 lb)   Height 1.676 m (_0 )       Constitutional:  72 y.o. female who appears in no distress. Normal color, no cyanosis.   HENT:   Head: Normocephalic and atraumatic.   Mouth/Throat: Oropharynx is clear and dry    Eyes: EOMI, PERRL   Neck: Trachea midline. Neck supple.  Cardiovascular: RRR, No murmurs, rubs or gallops. Intact distal pulses.  Pulmonary/Chest: BS equal bilaterally. No respiratory distress. No wheezes, rales or chest tenderness.   Abdominal: Bowel sounds present and normal. Abdomen soft, no tenderness, no rebound and no guarding.  Back: No midline spinal  tenderness, no paraspinal tenderness, no CVA tenderness.           Musculoskeletal: No edema, tenderness or deformity.  Skin: warm and dry. No rash, erythema, pallor or cyanosis  Psychiatric: normal mood and affect. Behavior is normal.   Neurological: Patient keenly alert and responsive, easily able to raise eyebrows, facial muscles/expressions symmetric, speaking in fluent sentences, moving all extremities equally and fully, normal gait  Patient Data     Labs Ordered/Reviewed   COMPREHENSIVE METABOLIC PANEL, NON-FASTING - Abnormal; Notable for the following components:       Result Value    SODIUM 135 (*)     ANION GAP 14 (*)     CREATININE 1.29 (*)     ESTIMATED GFR 44 (*)     GLUCOSE 235 (*)     All other components within normal limits    Narrative:     Estimated Glomerular Filtration Rate (eGFR) is calculated using the CKD-EPI (2021) equation, intended for patients 81 years of age and older. If gender is not documented or "unknown", there will be no eGFR calculation.   LACTIC ACID LEVEL W/ REFLEX FOR LEVEL >2.0 - Abnormal; Notable for the following components:    LACTIC ACID 4.9 (*)     All other components within  normal limits   URINALYSIS, MACRO/MICRO - Abnormal; Notable for the following components:    KETONES 15 (*)     All other components within normal limits   CBC WITH DIFF - Abnormal; Notable for the following components:    MCV 101.0 (*)     MCH 33.8 (*)     RDW 15.5 (*)     LYMPHOCYTE % 18 (*)     All other components within normal limits   MAGNESIUM - Abnormal; Notable for the following components:    MAGNESIUM 1.3 (*)     All other components within normal limits   COVID-19, FLU A/B, RSV RAPID BY PCR - Abnormal; Notable for the following components:    SARS-CoV-2 Detected (*)     All other components within normal limits    Narrative:     Results are for the simultaneous qualitative identification of SARS-CoV-2 (formerly 2019-nCoV), Influenza A, Influenza B, and RSV RNA. These etiologic agents are  generally detectable in nasopharyngeal and nasal swabs during the ACUTE PHASE of infection. Hence, this test is intended to be performed on respiratory specimens collected from individuals with signs and symptoms of upper respiratory tract infection who meet Centers for Disease Control and Prevention (CDC) clinical and/or epidemiological criteria for Coronavirus Disease 2019 (COVID-19) testing. CDC COVID-19 criteria for testing on human specimens is available at [facility] webpage information for Healthcare Professionals: Coronavirus Disease 2019 (COVID-19) ([email or web address]).     False-negative results may occur if the virus has genomic mutations, insertions, deletions, or rearrangements or if performed very early in the course of illness. Otherwise, negative results indicate virus specific RNA targets are not detected, however negative results do not preclude SARS-CoV-2 infection/COVID-19, Influenza, or Respiratory syncytial virus infection. Results should not be used as the sole basis for patient management decisions. Negative results must be combined with clinical observations, patient history, and epidemiological information. If upper respiratory tract infection is still suspected based on exposure history together with other clinical findings, re-testing should be considered.    Disclaimer:   This assay has been authorized by FDA under an Emergency Use Authorization for use in laboratories certified under the Clinical Laboratory Improvement Amendments of 1988 (CLIA), 42 U.S.C. [phone], to perform high complexity tests. The impacts of vaccines, antiviral therapeutics, antibiotics, chemotherapeutic or immunosuppressant drugs have not been evaluated.     Test methodology:   Cepheid Xpert Xpress SARS-CoV-2/Flu/RSV Assay real-time polymerase chain reaction (RT-PCR) test on the GeneXpert Dx and Xpert Xpress systems.   LACTIC ACID - FIRST REFLEX - Abnormal; Notable for the following  components:    LACTIC ACID 3.1 (*)     All other components within normal limits   VENOUS BLOOD GAS/LACTATE - Abnormal; Notable for the following components:    PO2 (VENOUS) 18 (*)     LACTATE 4.1 (*)     All other components within normal limits    Narrative:     RESULT TO DR. Latif Nazareno   POC BLOOD GLUCOSE (RESULTS) - Normal   ADULT ROUTINE BLOOD CULTURE, SET OF 2 BOTTLES (BACTERIA AND YEAST)   ADULT ROUTINE BLOOD CULTURE, SET OF 2 BOTTLES (BACTERIA AND YEAST)   URINALYSIS WITH REFLEX MICROSCOPIC AND CULTURE IF POSITIVE    Narrative:     The following orders were created for panel order URINALYSIS WITH REFLEX MICROSCOPIC AND CULTURE IF POSITIVE.  Procedure  Abnormality         Status                     ---------                               -----------         ------                     URINALYSIS, MACRO/MICRO[[id number]]      Abnormal            Final result                 Please view results for these tests on the individual orders.   CBC/DIFF    Narrative:     The following orders were created for panel order CBC/DIFF.  Procedure                               Abnormality         Status                     ---------                               -----------         ------                     CBC WITH DIFF[[id number]]                Abnormal            Final result                 Please view results for these tests on the individual orders.   EXTRA TUBES    Narrative:     The following orders were created for panel order EXTRA TUBES.  Procedure                               Abnormality         Status                     ---------                               -----------         ------                     LIGHT [place] TOP WGYK[[id number]]                             In process                   Please view results for these tests on the individual orders.   LIGHT [place] TOP TUBE   CBC/DIFF    Narrative:     The following orders were created for panel order CBC/DIFF.  Procedure  Abnormality         Status                     ---------                               -----------         ------                     CBC WITH DIFF[[id number]]                                                                 Please view results for these tests on the individual orders.   COMPREHENSIVE METABOLIC PANEL, NON-FASTING   MAGNESIUM   CBC WITH DIFF   LACTIC ACID - SECOND REFLEX   PERFORM POC WHOLE BLOOD GLUCOSE   PERFORM POC WHOLE BLOOD GLUCOSE   PERFORM POC WHOLE BLOOD GLUCOSE   PERFORM POC WHOLE BLOOD GLUCOSE     XR CHEST AP   Final Result by Edi, Radresults In (12/15 2322)   NO ACUTE FINDINGS.         Radiologist location ID: [id number]         CT ABDOMEN PELVIS WO IV CONTRAST   Final Result by Edi, Radresults In (12/15 2218)   NORMAL NONCONTRAST CT OF THE ABDOMEN AND PELVIS.          One or more dose reduction techniques were used (e.g., Automated exposure control, adjustment of the mA and/or kV according to patient size, use of iterative reconstruction technique).         Radiologist location ID: [place] Decision Making   Diff dx of viral syndrome. Dehydration , sepsis. UTI flu.   ED Course as of 12/06/22 0052   Fri Dec 05, 2022   2324 CT abdomen chest x-ray negative for any pathology patient is COVID positive   Sat Dec 06, 2022   0051 Patient discussed with Dr. Baldwin [place] AT [facility] ER PATIENT WILL BE ACCEPTED TO THE ER UNTIL ISOLATION BED IS AVAILABLE INPATIENT   Consideration of ischemic bowel due to lactic acid but has no heightened pain . No nvd while in er so far. Covid pos. 2249 pt wants to go to [facility], offered transfer to other places such as [facility] pt refused. Requested dr . Deon Pilling at [facility] place pt on wt list. No private beds open at this time . 2251 care endorsed to dr. Carlette Palmatier      Medications Administered in the ED   NS flush syringe (0 mL Intracatheter Not Given 12/05/22 2200)   NS flush syringe (has no administration in time range)    NS bolus infusion 1,000 mL (1,000 mL Intravenous New Bag/New Syringe 12/05/22 2222)   ondansetron (ZOFRAN) 2 mg/mL injection (4 mg Intravenous Given 12/05/22 2223)   famotidine (PEPCID) 10 mg/mL injection (20 mg Intravenous Given 12/05/22 2223)   pantoprazole (PROTONIX) injection (40 mg Intravenous Given 12/05/22 2222)   cefTRIAXone (ROCEPHIN) 1 g in NS 50 mL IVPB minibag (1 g Intravenous New Bag/New Syringe 12/05/22 2222)   NS bolus infusion 1,000 mL (  has no administration in time range)   NS bolus infusion 1,000 mL (has no administration in time range)   magnesium sulfate 1 G in D5W 100 mL premix IVPB (has no administration in time range)   magnesium sulfate 1 G in D5W 100 mL premix IVPB (has no administration in time range)   magnesium sulfate 1 G in D5W 100 mL premix IVPB (has no administration in time range)     Clinical Impression   Dehydration (Primary)   Gastroenteritis   Hypomagnesemia   Lactic acidosis   COVID-19       Disposition: Admitted

## 2022-12-05 NOTE — ED Nurses Note (Signed)
Critical Lactic Acid called from lab. 4.9.  NP notified.

## 2022-12-06 ENCOUNTER — Encounter (HOSPITAL_COMMUNITY): Payer: Self-pay | Admitting: Internal Medicine

## 2022-12-06 DIAGNOSIS — J9600 Acute respiratory failure, unspecified whether with hypoxia or hypercapnia: Secondary | ICD-10-CM

## 2022-12-06 DIAGNOSIS — U071 COVID-19: Secondary | ICD-10-CM | POA: Diagnosis present

## 2022-12-06 DIAGNOSIS — E119 Type 2 diabetes mellitus without complications: Secondary | ICD-10-CM

## 2022-12-06 DIAGNOSIS — E782 Mixed hyperlipidemia: Secondary | ICD-10-CM

## 2022-12-06 LAB — URINALYSIS, MACRO/MICRO
BILIRUBIN: NEGATIVE mg/dL
BLOOD: NEGATIVE mg/dL
GLUCOSE: NEGATIVE mg/dL
KETONES: 15 mg/dL — AB
LEUKOCYTES: NEGATIVE WBCs/uL
NITRITE: NEGATIVE
PH: 6 (ref 4.6–8.0)
PROTEIN: NEGATIVE mg/dL
SPECIFIC GRAVITY: 1.02 (ref 1.003–1.035)
UROBILINOGEN: 0.2 mg/dL (ref 0.2–1.0)

## 2022-12-06 LAB — LACTIC ACID - SECOND REFLEX: LACTIC ACID: 2.1 mmol/L (ref 0.5–2.2)

## 2022-12-06 LAB — COMPREHENSIVE METABOLIC PANEL, NON-FASTING
ALBUMIN/GLOBULIN RATIO: 1.2 (ref 0.8–1.4)
ALBUMIN: 3.6 g/dL (ref 3.5–5.7)
ALKALINE PHOSPHATASE: 54 U/L (ref 34–104)
ALT (SGPT): 11 U/L (ref 7–52)
ANION GAP: 10 mmol/L (ref 4–13)
AST (SGOT): 18 U/L (ref 13–39)
BILIRUBIN TOTAL: 0.4 mg/dL (ref 0.3–1.2)
BUN/CREA RATIO: 12 (ref 6–22)
BUN: 11 mg/dL (ref 7–25)
CALCIUM, CORRECTED: 8.5 mg/dL — ABNORMAL LOW (ref 8.9–10.8)
CALCIUM: 8.2 mg/dL — ABNORMAL LOW (ref 8.6–10.3)
CHLORIDE: 105 mmol/L (ref 98–107)
CO2 TOTAL: 21 mmol/L (ref 21–31)
CREATININE: 0.92 mg/dL (ref 0.60–1.30)
ESTIMATED GFR: 66 mL/min/{1.73_m2} (ref >59)
GLOBULIN: 3.1 (ref 2.9–5.4)
GLUCOSE: 180 mg/dL — ABNORMAL HIGH (ref 74–109)
OSMOLALITY, CALCULATED: 276 mOsm/kg (ref 270–290)
POTASSIUM: 4.6 mmol/L (ref 3.5–5.1)
PROTEIN TOTAL: 6.7 g/dL (ref 6.4–8.9)
SODIUM: 136 mmol/L (ref 136–145)

## 2022-12-06 LAB — BASIC METABOLIC PANEL
ANION GAP: 10 mmol/L (ref 4–13)
BUN/CREA RATIO: 12 (ref 6–22)
BUN: 11 mg/dL (ref 7–25)
CALCIUM: 8.2 mg/dL — ABNORMAL LOW (ref 8.6–10.3)
CHLORIDE: 105 mmol/L (ref 98–107)
CO2 TOTAL: 21 mmol/L (ref 21–31)
CREATININE: 0.92 mg/dL (ref 0.60–1.30)
ESTIMATED GFR: 66 mL/min/{1.73_m2} (ref >59)
GLUCOSE: 180 mg/dL — ABNORMAL HIGH (ref 74–109)
OSMOLALITY, CALCULATED: 276 mOsm/kg (ref 270–290)
POTASSIUM: 4.6 mmol/L (ref 3.5–5.1)
SODIUM: 136 mmol/L (ref 136–145)

## 2022-12-06 LAB — CBC WITH DIFF
BASOPHIL #: 0 10*3/uL (ref 0.00–0.10)
BASOPHIL %: 0 % (ref 0–1)
EOSINOPHIL #: 0 10*3/uL (ref 0.00–0.50)
EOSINOPHIL %: 0 % — ABNORMAL LOW
HCT: 37.4 % (ref 31.2–41.9)
HGB: 12.4 g/dL (ref 10.9–14.3)
LYMPHOCYTE #: 1.5 10*3/uL (ref 1.00–3.00)
LYMPHOCYTE %: 22 % (ref 16–44)
MCH: 32.7 pg (ref 24.7–32.8)
MCHC: 33.3 g/dL (ref 32.3–35.6)
MCV: 98.4 fL — ABNORMAL HIGH (ref 75.5–95.3)
MONOCYTE #: 0.4 10*3/uL (ref 0.30–1.00)
MONOCYTE %: 6 % (ref 5–13)
MPV: 9.2 fL (ref 7.9–10.8)
NEUTROPHIL #: 4.9 10*3/uL (ref 1.85–7.80)
NEUTROPHIL %: 72 % (ref 43–77)
PLATELETS: 152 10*3/uL (ref 140–440)
RBC: 3.8 10*6/uL (ref 3.63–4.92)
RDW: 13.6 % (ref 12.3–17.7)
WBC: 6.8 10*3/uL (ref 3.8–11.8)

## 2022-12-06 LAB — POC BLOOD GLUCOSE (RESULTS)
GLUCOSE, POC: 215 mg/dl (ref 50–500)
GLUCOSE, POC: 168 mg/dl (ref 50–500)
GLUCOSE, POC: 211 mg/dl (ref 50–500)
GLUCOSE, POC: 324 mg/dl (ref 50–500)
GLUCOSE, POC: 292 mg/dl (ref 50–500)

## 2022-12-06 LAB — ARTERIAL BLOOD GAS/LACTATE
%FIO2 (ARTERIAL): 30 %
ALLEN TEST: POSITIVE
BASE DEFICIT: 5 mmol/L — ABNORMAL HIGH (ref 0.0–2.0)
BICARBONATE (ARTERIAL): 20.5 mmol/L (ref 20.0–26.0)
CARBOXYHEMOGLOBIN: 1.1 % (ref <=1.5)
LACTATE: 2.8 mmol/L (ref <=2.0)
OXYHEMOGLOBIN: 97.6 % (ref 88.0–100.0)
PCO2 (ARTERIAL): 42 mm/Hg (ref 35–45)
PH (ARTERIAL): 7.31 — ABNORMAL LOW (ref 7.35–7.45)
PO2 (ARTERIAL): 113 mm/Hg — ABNORMAL HIGH (ref 80–100)

## 2022-12-06 LAB — LIGHT GREEN TOP TUBE

## 2022-12-06 LAB — MAGNESIUM: MAGNESIUM: 2.2 mg/dL (ref 1.9–2.7)

## 2022-12-06 LAB — LACTIC ACID - FIRST REFLEX: LACTIC ACID: 3.1 mmol/L — ABNORMAL HIGH (ref 0.4–2.0)

## 2022-12-06 MED ORDER — DEXTROSE 50 % IN WATER (D50W) INTRAVENOUS SYRINGE
25.0000 g | INJECTION | INTRAVENOUS | Status: DC | PRN
Start: 2022-12-06 — End: 2022-12-10

## 2022-12-06 MED ORDER — SODIUM CHLORIDE 0.9 % INTRAVENOUS SOLUTION
200.0000 mg | Freq: Once | INTRAVENOUS | Status: AC
Start: 2022-12-06 — End: 2022-12-06
  Administered 2022-12-06: 200 mg via INTRAVENOUS
  Administered 2022-12-06: 0 mg via INTRAVENOUS
  Filled 2022-12-06: qty 40

## 2022-12-06 MED ORDER — DEXTROSE 50 % IN WATER (D50W) INTRAVENOUS SYRINGE
12.5000 g | INJECTION | INTRAVENOUS | Status: DC | PRN
Start: 2022-12-06 — End: 2022-12-10

## 2022-12-06 MED ORDER — INSULIN REGULAR HUMAN 100 UNIT/ML INJECTION SSIP
0.0000 [IU] | INJECTION | Freq: Four times a day (QID) | SUBCUTANEOUS | Status: DC
Start: 2022-12-06 — End: 2022-12-10
  Administered 2022-12-06: 9 [IU] via SUBCUTANEOUS
  Administered 2022-12-06: 3 [IU] via SUBCUTANEOUS
  Administered 2022-12-06: 6 [IU] via SUBCUTANEOUS
  Administered 2022-12-06: 9 [IU] via SUBCUTANEOUS
  Administered 2022-12-07: 3 [IU] via SUBCUTANEOUS
  Administered 2022-12-07 – 2022-12-08 (×4): 9 [IU] via SUBCUTANEOUS
  Administered 2022-12-08: 3 [IU] via SUBCUTANEOUS
  Administered 2022-12-08 – 2022-12-09 (×3): 9 [IU] via SUBCUTANEOUS
  Administered 2022-12-09: 0 [IU] via SUBCUTANEOUS
  Administered 2022-12-09 (×2): 9 [IU] via SUBCUTANEOUS
  Administered 2022-12-10: 3 [IU] via SUBCUTANEOUS
  Filled 2022-12-06 (×2): qty 9
  Filled 2022-12-06 (×6): qty 27
  Filled 2022-12-06: qty 9
  Filled 2022-12-06 (×3): qty 27

## 2022-12-06 MED ORDER — INSULIN U-100 REGULAR HUMAN 100 UNIT/ML INJECTION SOLUTION
INTRAMUSCULAR | Status: AC
Start: 2022-12-06 — End: 2022-12-06
  Filled 2022-12-06: qty 9

## 2022-12-06 MED ORDER — FUROSEMIDE 10 MG/ML INJECTION SOLUTION
20.0000 mg | Freq: Once | INTRAMUSCULAR | Status: AC
Start: 2022-12-06 — End: 2022-12-06
  Administered 2022-12-06: 20 mg via INTRAVENOUS
  Filled 2022-12-06: qty 4

## 2022-12-06 MED ORDER — HEPARIN (PORCINE) 5,000 UNIT/ML INJECTION SOLUTION
INTRAMUSCULAR | Status: AC
Start: 2022-12-06 — End: 2022-12-06
  Filled 2022-12-06: qty 1

## 2022-12-06 MED ORDER — DEXAMETHASONE SODIUM PHOSPHATE (PF) 10 MG/ML INJECTION SOLUTION
6.0000 mg | Freq: Every day | INTRAMUSCULAR | Status: DC
Start: 2022-12-06 — End: 2022-12-10
  Administered 2022-12-06 – 2022-12-09 (×4): 6 mg via INTRAVENOUS
  Filled 2022-12-06 (×3): qty 1

## 2022-12-06 MED ORDER — ALBUTEROL SULFATE HFA 90 MCG/ACTUATION AEROSOL INHALER
2.0000 | INHALATION_SPRAY | Freq: Four times a day (QID) | RESPIRATORY_TRACT | Status: DC | PRN
Start: 2022-12-06 — End: 2022-12-10

## 2022-12-06 MED ORDER — GLUCAGON 1 MG/ML SOLUTION FOR INJECTION
1.0000 mg | INTRAMUSCULAR | Status: DC | PRN
Start: 2022-12-06 — End: 2022-12-10

## 2022-12-06 MED ORDER — HEPARIN (PORCINE) 5,000 UNIT/ML INJECTION SOLUTION
5000.0000 [IU] | Freq: Three times a day (TID) | INTRAMUSCULAR | Status: DC
Start: 2022-12-06 — End: 2022-12-10
  Administered 2022-12-06 – 2022-12-07 (×5): 5000 [IU] via SUBCUTANEOUS
  Administered 2022-12-07: 0 [IU] via SUBCUTANEOUS
  Administered 2022-12-08 – 2022-12-10 (×7): 5000 [IU] via SUBCUTANEOUS
  Filled 2022-12-06 (×12): qty 1

## 2022-12-06 MED ORDER — SODIUM CHLORIDE 0.9 % INTRAVENOUS SOLUTION
100.0000 mg | INTRAVENOUS | Status: AC
Start: 2022-12-07 — End: 2022-12-10
  Administered 2022-12-07: 0 mg via INTRAVENOUS
  Administered 2022-12-07 – 2022-12-08 (×2): 100 mg via INTRAVENOUS
  Administered 2022-12-08 – 2022-12-09 (×2): 0 mg via INTRAVENOUS
  Administered 2022-12-09 – 2022-12-10 (×2): 100 mg via INTRAVENOUS
  Administered 2022-12-10: 0 mg via INTRAVENOUS
  Filled 2022-12-06 (×5): qty 20

## 2022-12-06 MED ORDER — HYDROCODONE 5 MG-ACETAMINOPHEN 325 MG TABLET
ORAL_TABLET | ORAL | Status: AC
Start: 2022-12-06 — End: 2022-12-06
  Filled 2022-12-06: qty 2

## 2022-12-06 MED ORDER — DEXAMETHASONE SODIUM PHOSPHATE (PF) 10 MG/ML INJECTION SOLUTION
INTRAMUSCULAR | Status: AC
Start: 2022-12-06 — End: 2022-12-06
  Filled 2022-12-06: qty 1

## 2022-12-06 MED ORDER — INSULIN U-100 REGULAR HUMAN 100 UNIT/ML INJECTION SOLUTION
INTRAMUSCULAR | Status: AC
Start: 2022-12-06 — End: 2022-12-06
  Filled 2022-12-06: qty 18

## 2022-12-06 MED ORDER — HYDROCODONE 5 MG-ACETAMINOPHEN 325 MG TABLET
2.0000 | ORAL_TABLET | ORAL | Status: AC
Start: 2022-12-06 — End: 2022-12-06
  Administered 2022-12-06: 2 via ORAL

## 2022-12-06 MED ORDER — INSULIN U-100 REGULAR HUMAN 100 UNIT/ML INJECTION SOLUTION
INTRAMUSCULAR | Status: AC
Start: 2022-12-06 — End: 2022-12-06
  Filled 2022-12-06: qty 27

## 2022-12-06 MED ORDER — HYDROCODONE 5 MG-ACETAMINOPHEN 325 MG TABLET
ORAL_TABLET | ORAL | Status: AC
Start: 2022-12-06 — End: 2022-12-06
  Filled 2022-12-06: qty 1

## 2022-12-06 NOTE — ED Nurses Note (Signed)
Nurse notified of patient O2 saturation. Pt dropped to 85% on pulse oximetry. Nurse in to reinitiate O2 @ 2L.

## 2022-12-06 NOTE — ED Nurses Note (Signed)
Nurse in to see patient. Pt disconnected from oxygen for trial. Monitor tech made aware. No other requests at this.

## 2022-12-06 NOTE — ED Nurses Note (Addendum)
Report called to Caryl Pina on [address] and updated patient husband via phone.

## 2022-12-06 NOTE — ED Attending Handoff Note (Signed)
[facility]  Emergency Department  Course Note    Care/report received from Dr. elder at  12:30 a.m.     Per report:  Grace Hoffman is a 71 y.o. female who had concerns including Back Pain, Diarrhea, and Nausea.  Patient was seen at [facility].  She was diagnosed with COVID-19.  She also has some gastroenteritis symptoms.  Little bit of acute kidney injury hypomagnesemia and lactic acid doses.  Dr. Orson Slick (hospitalist) I would accepted the patient for admission; however, no beds were available on the floor.  We took the patient emergency room emergency room.  She was currently resting comfortably in room 20 blood pressure 129/55 with a heart rate of 87 respiratory rate of 18 with an oxygen saturation of 97% on room air.  She was afebrile at 98.7.  Patient's magnesium was low at 1.3.  IV replacement has been ordered.  Patient also given ceftriaxone and azithromycin.  She was denying any chest pain or shortness of breath at this time.  She was complaining of her chronic back pain.  She only takes Lortab 7.5 p.r.n..  I have ordered Lortab 5 2. P.o. at this time.  Dr. Orson Slick will be admitting patient to the hospitalist service.    Pending labs/imaging/consults:  Pending admission to hospitalist service.  Plan:  Admit to hospitalist service    Course:   ED Course as of 12/06/22 0245   Sat Dec 06, 2022   0241 COVID positive, influenza/RSV negative   0243 Venous blood gas shows a pH of 7.34 with a pCO2 of 46   0243 Initial lactic acid 4.9 with a repeat of 3.1   0243 Initial magnesium 1.3   0243 MCV is 101 otherwise CBC is unremarkable   0244 BUN 15 with a creatinine 1.29 and glucose of 235 otherwise CMP is unremarkable   0244 Single portable view chest shows no acute infiltrate, no acute disease process   0244 Noncontrast CT of abdomen pelvis shows no acute abnormality.     Patient will be admitted to the  service for further workup and management.    Disposition: Admitted    Clinical  Impression   Dehydration (Primary)   Gastroenteritis   Hypomagnesemia   Lactic acidosis   COVID-19         Madelaine Bhat, DO  12/06/2022, 02:40

## 2022-12-06 NOTE — H&P (Signed)
[place]    HOSPITALIST H&P    CORTANA VANDERFORD 71 y.o. female ED20/ED20   Date of Service: 12/06/2022    Date of Admission:  12/05/2022   PCP: Cleophus Molt, PA-C Code Status:No Order       Chief Complaint:  Cough, shortness for breath, fever    HPI:   This 71 year old white female with known history of diabetes, TIA, chronic pain, presents to the ED with complaints of cough, shortness for breath, low-grade fever for the last 3 days.  She reports no chest pain, she has not had any vomiting, but does report some diarrhea.  She has had COVID vaccine and booster x1.  She does not report any specific exposures.  She was evaluated in the ED today and found to be hypoxic with oxygen saturations dropping into the low 80s on room air.  She was placed on oxygen 2 L, and was found to be COVID positive.  Her chest x-ray was clear.  Laboratory studies do show white blood count of 7.6, hemoglobin 14.2, hematocrit 42.5, platelets of 181, sodium 135, potassium 3.9, bicarb of 22, BUN of 1.5, creatinine of 1.29 which is slightly higher than baseline of 1.08.  Glucose of 235.  Magnesium level was quite low at 1.3.  Patient's lactate initially was 4.9, however on repeat was 3.1.  She will receive IV fluids, and due to comorbidities, we will treat with Decadron, antibiotics, albuterol, remdesivir.      ED medications:   Medications Administered in the ED   NS flush syringe (0 mL Intracatheter Not Given 12/05/22 2200)   NS flush syringe (has no administration in time range)   NS bolus infusion 1,000 mL (1,000 mL Intravenous New Bag/New Syringe 12/05/22 2222)   ondansetron (ZOFRAN) 2 mg/mL injection (4 mg Intravenous Given 12/05/22 2223)   famotidine (PEPCID) 10 mg/mL injection (20 mg Intravenous Given 12/05/22 2223)   pantoprazole (PROTONIX) injection (40 mg Intravenous Given 12/05/22 2222)   cefTRIAXone (ROCEPHIN) 1 g in NS 50 mL IVPB minibag (1 g Intravenous New Bag/New Syringe 12/05/22 2222)   NS bolus  infusion 1,000 mL (has no administration in time range)   NS bolus infusion 1,000 mL (has no administration in time range)   magnesium sulfate 1 G in D5W 100 mL premix IVPB (has no administration in time range)   magnesium sulfate 1 G in D5W 100 mL premix IVPB (has no administration in time range)   magnesium sulfate 1 G in D5W 100 mL premix IVPB (has no administration in time range)         PMHx:    Past Medical History:   Diagnosis Date    Anxiety     Chronic back pain     COVID-19 vaccine series completed     Depression     Diabetes mellitus, type 2 (CMS HCC)     Fibromyalgia affecting multiple sites     Hypertension     Hypomagnesemia     Insomnia disorder     Migraine     Mixed hyperlipidemia     Orthostatic hypotension     Osteoarthritis     TIA (transient ischemic attack)     Unspecified glaucoma(365.9)     Vitamin D deficiency     PSHx:   Past Surgical History:   Procedure Laterality Date    HX CHOLECYSTECTOMY      HX KNEE REPLACMENT Left     HX ROTATOR CUFF REPAIR Left  SKIN CANCER EXCISION      forehead removed       Allergies:    Allergies   Allergen Reactions    Codeine Nausea/ Vomiting    Amoxicillin Diarrhea    Macrobid [Nitrofurantoin Monohyd/M-Cryst] Nausea/ Vomiting    Social History  Social History     Tobacco Use    Smoking status: Never    Smokeless tobacco: Never   Vaping Use    Vaping Use: Never used   Substance Use Topics    Alcohol use: Never    Drug use: Never       Family History  Family Medical History:       Problem Relation (Age of Onset)    Kidney failure Mother    No Known Problems Sister, Brother, Half-Sister, Half-Brother, Maternal 35, Maternal Uncle, Paternal 72, Paternal Uncle, Maternal Grandmother, Maternal Grandfather, Paternal 38, Paternal 42, Daughter, Son    Respiratory Problems Father               Home Meds:      Prior to Admission medications    Medication Sig Start Date End Date Taking? Authorizing Provider   d-mannose 500 mg Oral Capsule Take 1  Capsule (500 mg total) by mouth Twice daily    Provider, Historical   diazePAM (VALIUM) 5 mg Oral Tablet Take 1 Tablet (5 mg total) by mouth Once a day 12/02/22   Alvis, Amy, PA-C   enalapril maleate (VASOTEC) 10 mg Oral Tablet Take 1 Tablet (10 mg total) by mouth Once a day for 180 days 10/07/22 04/05/23  Rogue Jury, NP   ergocalciferol, vitamin D2, (DRISDOL) 1,250 mcg (50,000 unit) Oral Capsule Take 1 Capsule (50,000 Units total) by mouth Every 7 days 04/17/22   Alvis, Amy, PA-C   famotidine (PEPCID) 20 mg Oral Tablet TAKE 1 TABLET BY MOUTH TWICE DAILY  Patient taking differently: Take 1 Tablet (20 mg total) by mouth Every evening 03/27/22   Alvis, Amy, PA-C   fluticasone propionate (FLONASE) 50 mcg/actuation Nasal Spray, Suspension Administer 1 Spray into each nostril Once a day for 30 days 11/03/22 12/03/22  Alvis, Amy, PA-C   glimepiride (AMARYL) 4 mg Oral Tablet TAKE 1 TABLET(4 MG) BY MOUTH TWICE DAILY WITH A MEAL FOR DIABETES 10/06/22   Alvis, Amy, PA-C   hydroCHLOROthiazide (HYDRODIURIL) 25 mg Oral Tablet Take 1 Tablet (25 mg total) by mouth Once per day as needed 06/12/22   Alvis, Amy, PA-C   HYDROcodone-acetaminophen (NORCO) 7.5-325 mg Oral Tablet Take 1 Tablet by mouth Every 6 hours as needed for up to 30 days Indications: pain 12/03/22 01/02/23  Feliberto Harts, DO   latanoprost (XALATAN) 0.005 % Ophthalmic Drops Administer 1 Drop into the left eye Every night    Provider, Historical   Levetiracetam 500 mg Oral Tablet Sustained Release 24 hr Take 1 Tablet (500 mg total) by mouth Every night for 90 days 03/17/22 11/03/22  Alvis, Amy, PA-C   loratadine (CLARITIN) 10 mg Oral Tablet Take 1 Tablet (10 mg total) by mouth Once per day as needed 12/02/22   Alvis, Amy, PA-C   magnesium oxide (MAG-OX) 400 mg Oral Tablet Take 2 Tablets (800 mg total) by mouth Once a day    Provider, Historical   MetFORMIN (GLUCOPHAGE) 1,000 mg Oral Tablet Take 1 Tablet (1,000 mg total) by mouth Twice daily with food 04/23/22    Alvis, Amy, PA-C   Pen Needle, Disposable, 32 gauge x 5/32" Needle Use as directed once daily Indications: E11.9  Type 2 Diabetic 12/02/22   Alvis, Amy, PA-C   rosuvastatin (CRESTOR) 10 mg Oral Tablet TAKE 1 TABLET BY MOUTH EVERY NIGHT AT BEDTIME 04/21/22   Alvis, Amy, PA-C   solifenacin (VESICARE) 5 mg Oral Tablet TAKE 1 TABLET(5 MG) BY MOUTH EVERY DAY 07/17/22   Alvis, Amy, PA-C   SUMAtriptan (IMITREX) 50 mg Oral Tablet Take 1 Tablet (50 mg total) by mouth Once, as needed for Migraine for up to 1 dose TAKE 1 TABLET BY MOUTH AT ONSET OF HEADACHE. IF NO RELIEF MAY REPEAT 1 AFTER AT LEAST 2 HOURS. MAX OF 4 TABS PER 24 HOURS 10/06/22   Alvis, Amy, PA-C   TOUJEO MAX U-300 SOLOSTAR 300 unit/mL (3 mL) Subcutaneous Insulin Pen ADMINISTER 10 UNITS UNDER THE SKIN EVERY NIGHT 10/21/22   Alvis, Amy, PA-C   venlafaxine (EFFEXOR XR) 37.5 mg Oral Capsule, Sust. Release 24 hr Take 1 Capsule (37.5 mg total) by mouth Every night 02/17/22   Provider, Historical   diazePAM (VALIUM) 5 mg Oral Tablet Take 1 Tablet (5 mg total) by mouth Once a day 11/03/22 12/02/22  Alvis, Amy, PA-C   HYDROcodone-acetaminophen (NORCO) 5-325 mg Oral Tablet Take 1 Tablet by mouth Three times a day Indications: pain, Supply issue. Previous dose not available 11/04/22 12/02/22  Feliberto Harts, DO   loratadine (CLARITIN) 10 mg Oral Tablet Take 1 Tablet (10 mg total) by mouth Once per day as needed 11/03/22 12/02/22  Alvis, Amy, PA-C   Pen Needle, Disposable, 32 gauge x 5/32" Needle Use as directed once daily Indications: E11.9 Type 2 Diabetic 07/25/22 12/02/22  Alvis, Amy, PA-C          ROS:   General:  Low-grade fever for 3 days.  Weakness present.  HEENT: No headaches, dizziness, changes in vision, changes in hearing, or difficulty swallowing.    Skin:  No rashes, erythema or bruises.   Cardiac: No chest pain, palpitations, or arrhythmia.    Respiratory:  Shortness for breath with dyspnea on exertion nonproductive cough for 3 days.  GI: No nausea or vomiting.  No abdominal pain.  Diarrhea present  Urinary: No dysuria, hematuria, or change in frequency.    Vascular: No edema.     Musculoskeletal: No muscle weakness, pain, or decreased range of motion.   Neurologic: No loss of sensation, numbness or tingling.   Endocrine: No heat or cold intolerance or polydipsia.   Psychiatric: No insomnia, depression or anxiety.      Results for orders placed or performed during the hospital encounter of 12/05/22 (from the past 24 hour(s))   COMPREHENSIVE METABOLIC PANEL, NON-FASTING   Result Value Ref Range    SODIUM 135 (L) 136 - 145 mmol/L    POTASSIUM 3.9 3.5 - 5.1 mmol/L    CHLORIDE 99 98 - 107 mmol/L    CO2 TOTAL 22 21 - 32 mmol/L    ANION GAP 14 (H) 4 - 13 mmol/L    BUN 15 7 - 18 mg/dL    CREATININE 1.29 (H) 0.55 - 1.02 mg/dL    BUN/CREA RATIO 12     ESTIMATED GFR 44 (L) >59 mL/min/1.65m2    ALBUMIN 3.4 3.4 - 5.0 g/dL    CALCIUM 9.7 8.5 - 10.1 mg/dL    GLUCOSE 235 (H) 74 - 106 mg/dL    ALKALINE PHOSPHATASE 81 46 - 116 U/L    ALT (SGPT) 15 <=78 U/L    AST (SGOT) 19 15 - 37 U/L    BILIRUBIN TOTAL 0.5 0.2 -  1.0 mg/dL    PROTEIN TOTAL 7.8 6.4 - 8.2 g/dL    ALBUMIN/GLOBULIN RATIO 0.8 0.8 - 1.4    OSMOLALITY, CALCULATED 279 270 - 290 mOsm/kg    CALCIUM, CORRECTED 10.2 mg/dL    GLOBULIN 4.4    CBC WITH DIFF   Result Value Ref Range    WBC 7.6 4.0 - 10.5 x10^3/uL    RBC 4.21 4.20 - 5.40 x10^6/uL    HGB 14.2 12.5 - 16.0 g/dL    HCT 42.5 37.0 - 47.0 %    MCV 101.0 (H) 78.0 - 99.0 fL    MCH 33.8 (H) 27.0 - 32.0 pg    MCHC 33.5 32.0 - 36.0 g/dL    RDW 15.5 (H) 11.6 - 14.8 %    PLATELETS 181 140 - 440 x10^3/uL    MPV 9.0 7.4 - 10.4 fL    NEUTROPHIL % 75 40 - 76 %    LYMPHOCYTE % 18 (L) 25 - 45 %    MONOCYTE % 7 0 - 12 %    EOSINOPHIL % 0 0 - 7 %    BASOPHIL % 0 0 - 3 %    NEUTROPHIL # 5.64 1.80 - 8.40 x10^3/uL    LYMPHOCYTE # 1.34 1.10 - 5.00 x10^3/uL    MONOCYTE # 0.54 0.00 - 1.30 x10^3/uL    EOSINOPHIL # 0.03 0.00 - 0.80 x10^3/uL    BASOPHIL # 0.02 0.00 - 0.30 x10^3/uL   MAGNESIUM   Result  Value Ref Range    MAGNESIUM 1.3 (L) 1.8 - 2.4 mg/dL   LACTIC ACID LEVEL W/ REFLEX FOR LEVEL >2.0   Result Value Ref Range    LACTIC ACID 4.9 (HH) 0.4 - 2.0 mmol/L   COVID-19, FLU A/B, RSV RAPID BY PCR   Result Value Ref Range    SARS-CoV-2 Detected (A) Not Detected    INFLUENZA VIRUS TYPE A Not Detected Not Detected    INFLUENZA VIRUS TYPE B Not Detected Not Detected    RESPIRATORY SYNCTIAL VIRUS (RSV) Not Detected Not Detected   LIGHT Rostron TOP TUBE   Result Value Ref Range    RAINBOW/EXTRA TUBE AUTO RESULT Yes    VENOUS BLOOD GAS/LACTATE   Result Value Ref Range    PH (VENOUS) 7.34 7.31 - 7.41    PCO2 (VENOUS) 46 41 - 51 mm/Hg    PO2 (VENOUS) 18 (LL) 35 - 50 mm/Hg    BICARBONATE (VENOUS) 25.1 22.0 - 26.0 mmol/L    LACTATE 4.1 (HH) <=2.0 mmol/L    OXYHEMOGLOBIN      CARBOXYHEMOGLOBIN      MET-HEMOGLOBIN      %FIO2 (VENOUS) 21.0 %    O2CT     POC BLOOD GLUCOSE (RESULTS)   Result Value Ref Range    GLUCOSE, POC 181 50 - 500 mg/dl   LACTIC ACID - FIRST REFLEX   Result Value Ref Range    LACTIC ACID 3.1 (H) 0.4 - 2.0 mmol/L   URINALYSIS, MACRO/MICRO   Result Value Ref Range    COLOR Yellow Yellow    APPEARANCE Clear Clear    SPECIFIC GRAVITY 1.020 1.003 - 1.035    PH 6.0 4.6 - 8.0    LEUKOCYTES Negative Negative WBCs/uL    NITRITE Negative Negative    PROTEIN Negative Negative mg/dL    GLUCOSE Negative Negative mg/dL    KETONES 15 (A) Negative mg/dL    BILIRUBIN Negative Negative mg/dL    BLOOD Negative Negative mg/dL  UROBILINOGEN 0.2 0.2 - 1.0 mg/dL   POC BLOOD GLUCOSE (RESULTS)   Result Value Ref Range    GLUCOSE, POC 215 50 - 500 mg/dl          Physical:  Filed Vitals:    12/06/22 0045 12/06/22 0100 12/06/22 0130 12/06/22 0136   BP:  127/69  (!) 129/55   Pulse: 83 82 87    Resp: 17 18     Temp:       SpO2: 95% 96% 97%       General: Patient is alert and oriented to person, place, and time. No acute distress. Communicates appropriately.   Head: Normocephalic and atraumatic.    Eyes: Pupils equally round and react  to light and accommodate. Extraocular movements intact.  Conjunctiva normal. Sclerae are normal.    Nose: Nasal passages clear. Mucosa moist.    Throat: Moist oral mucosa. No erythema or exudate of the pharynx. Clear oropharynx.    Neck: Supple. No cervical lymphadenopathy or supraclavicular nodes detected. Trachea midline   Heart: Regular rate and rhythm. S1 & S2 present. No S3 or S4. No rubs, gallops, or murmurs appreciated.   Lungs: Clear to auscultation bilaterally with no wheezes or rales. Equal chest excursion.  No conversational dyspnea. No respiratory distress noted.  Diminished breath sounds throughout.  Abdomen: Soft, nontender, nondistended belly. Bowel sounds are present in all four quadrants. No rigidity.  No guarding.  No ascites.   Extremities: No edema, cyanosis, or clubbing. Grossly moves all extremities.    Skin: Warm and dry without lesions. No ecchymosis noted.    Neurologic: Cranial nerves II through XII are grossly intact.Strength 5/5 in upper extremities and lower extremities bilaterally.    Genitourinary:  No urinary incontinence or Foley catheter   Psychiatric: Judgment and insight are intact. Mood and affect are appropriate for the situation.       Diagnostic studies:  No results found.       EKG interpretation:     _0 @    Assessment:  Active Hospital Problems   (*Primary Problem)    Diagnosis    *Acute respiratory failure due to COVID-19 (CMS HCC)    Mixed hyperlipidemia    Diabetes mellitus, type 2 (CMS HCC)       Plan:  Patient will be admitted for the above problems.  1. Acute respiratory failure due to COVID-19:  Patient will be started on treatment with oxygen at 2 L, remdesivir, Decadron, antibiotics, albuterol.  We will repeat labs daily, and try to wean oxygen as soon as possible.    2.  Mixed hyperlipidemia continue home med    3. Diabetes mellitus monitor sliding scale insulin.        Labs to be repeated daily, we will await culture results.  Further interventions will be  based on his clinical course.  The hospitalist has examined patient, and reviewed all material, and agrees with the above medical management at this time.      DVT prophylaxis:  Heparin      Annamarie Dawley, PA-C    [facility]

## 2022-12-06 NOTE — ED Nurses Note (Signed)
Pt found to be up wondering around. Pt keeps removing O2, pulse ox and BP monitoring devices.  This nurse has educated the pt as to why those things are needed.  Pt has been redirected to stretcher and hooked back up to monitor and O2 at this time.  Pt still continuing to remove monitoring devices and O2.  Provider notified at this time.

## 2022-12-06 NOTE — ED Nurses Note (Signed)
Report called to [facility] at [facility] ER.  [facility] here to transport patient.  VSS.  IV intact.  Left ER via stretcher in stable condition.

## 2022-12-06 NOTE — Progress Notes (Signed)
[facility]  IP PROGRESS NOTE      Grace Hoffman, Grace Hoffman  Date of Admission:  12/05/2022  Date of Birth:  03/07/1951  Date of Service:  12/06/2022    Hospital Day:  LOS: 0 days     Subjective:   Patient seen examined for follow-up of acute respiratory failure with hypoxia, COVID-19.  Patient is not normally on oxygen at home however on admission she has required up to 2 L oxygen via nasal cannula.    Vital Signs:  Temp (24hrs) Max:37.1 C (98.7 F)      Temperature: 37.1 C (98.7 F)  BP (Non-Invasive): (!) 129/55  MAP (Non-Invasive): 74 mmHG  Heart Rate: 87  Respiratory Rate: 18  SpO2: 97 %    Current Medications:  albuterol 90 mcg per inhalation oral inhaler - "Respiratory to administer", 2 Puff, Inhalation, Q6H PRN  azithromycin (ZITHROMAX) 500 mg in NS 250 mL IVPB, 500 mg, Intravenous, Q24H  cefTRIAXone (ROCEPHIN) 1 g in NS 50 mL IVPB minibag, 1 g, Intravenous, Q24H  dexAMETHasone (PF) 10 mg/mL injection, 6 mg, Intravenous, Daily  dextrose 50% (0.5 g/mL) injection - syringe, 25 g, Intravenous, Q15 Min PRN   Or  dextrose 50% (0.5 g/mL) injection - syringe, 12.5 g, Intravenous, Q15 Min PRN   Or  glucagon (GLUCAGEN DIAGNOSTIC KIT) injection 1 mg, 1 mg, Subcutaneous, Q15 Min PRN   Or  glucagon (GLUCAGEN DIAGNOSTIC KIT) injection 1 mg, 1 mg, IntraMUSCULAR, Q15 Min PRN  heparin 5,000 unit/mL injection, 5,000 Units, Subcutaneous, Q8HRS  NS flush syringe, 3 mL, Intracatheter, Q8HRS  NS flush syringe, 3 mL, Intracatheter, Q1H PRN  [START ON 12/07/2022] remdesivir 100 mg in NS 250 mL (tot vol) IVPB, 100 mg, Intravenous, Q24H  SSIP insulin R human (HumuLIN R) 100 units/mL injection, 0-12 Units, Subcutaneous, 4x/day AC        Current Orders:  Active Orders   Lab    LACTIC ACID - FIRST REFLEX     Frequency: ONE TIME     Number of Occurrences: 1 Occurrences   Diet    DIET CARDIAC (2G NA, LOWFAT, LOW CHOL) Do you want to initiate MNT Protocol? Yes     Frequency: All Meals      Number of Occurrences: 1 Occurrences   Nursing    TELEMETRY MONITORING - Continuous     Frequency: CONTINUOUS     Number of Occurrences: Until Specified   Isolation    ENHANCED DROPLET & CONTACT ISOLATION     Frequency: CONTINUOUS     Number of Occurrences: Until Specified     Order Comments: Private room  Wear a fitted N95/MSA/CAPR when entering room  Wear gown and gloves when entering room  Wear eye protection if exposure anticipated  Prior to transport, notify receiving department of precautions  Prior to transport, ensure patient is wearing a mask and follows Respiratory Hygiene/Cough Etiquette  Provide patient and family education regarding isolation    For the CDC's complete guidelines on contact isolation, see page 24 of https://wu.com/.pdf     Respiratory Care    OXYGEN - NASAL CANNULA     Frequency: CONTINUOUS     Number of Occurrences: Until Specified     Order Comments: Flowrate should not exeed 6L/min except [facility] facility.          Point of Care Testing    PERFORM POC WHOLE BLOOD GLUCOSE     Frequency: TID AC & HS  Number of Occurrences: Until Specified    PERFORM POC WHOLE BLOOD GLUCOSE     Frequency: TID AC & HS     Number of Occurrences: Until Specified   IV    INSERT & MAINTAIN PERIPHERAL IV ACCESS     Frequency: UNTIL DISCONTINUED     Number of Occurrences: Until Specified    PERIPHERAL IV DRESSING CHANGE     Frequency: PRN     Number of Occurrences: Until Specified   Admission    PATIENT CLASS/LEVEL OF CARE DESIGNATION - PRN     Frequency: ONE TIME     Number of Occurrences: 1 Occurrences     Order Comments: I certify hospital services are required for this patient based on their diagnosis, age, co-morbidities, and risk of adverse event if discharged.  All hospital services provided to the patient will be in accordance to 42 CFR 412.3.  Information regarding the patients diagnosis, testing, and treatment may be found in the H&P and subsequent progress  notes.       Medications    albuterol 90 mcg per inhalation oral inhaler - "Respiratory to administer"     Frequency: Q6H PRN     Dose: 2 Puff     Route: Inhalation    azithromycin (ZITHROMAX) 500 mg in NS 250 mL IVPB     Frequency: Q24H     Dose: 500 mg     Route: Intravenous    cefTRIAXone (ROCEPHIN) 1 g in NS 50 mL IVPB minibag     Frequency: Q24H     Dose: 1 g     Route: Intravenous    dexAMETHasone (PF) 10 mg/mL injection     Frequency: Daily     Dose: 6 mg     Route: Intravenous    dextrose 50% (0.5 g/mL) injection - syringe     Linked Order: Or     Frequency: Q15 Min PRN     Dose: 25 g     Route: Intravenous    dextrose 50% (0.5 g/mL) injection - syringe     Linked Order: Or     Frequency: Q15 Min PRN     Dose: 12.5 g     Route: Intravenous    glucagon (GLUCAGEN DIAGNOSTIC KIT) injection 1 mg     Linked Order: Or     Frequency: Q15 Min PRN     Dose: 1 mg     Route: Subcutaneous    glucagon (GLUCAGEN DIAGNOSTIC KIT) injection 1 mg     Linked Order: Or     Frequency: Q15 Min PRN     Dose: 1 mg     Route: IntraMUSCULAR    heparin 5,000 unit/mL injection     Frequency: Q8HRS     Dose: 5,000 Units     Route: Subcutaneous    NS flush syringe     Frequency: Q8HRS     Dose: 3 mL     Route: Intracatheter    NS flush syringe     Frequency: Q1H PRN     Dose: 3 mL     Route: Intracatheter    remdesivir 100 mg in NS 250 mL (tot vol) IVPB     Frequency: Q24H     Dose: 100 mg     Route: Intravenous    SSIP insulin R human (HumuLIN R) 100 units/mL injection     Frequency: 4x/day AC     Dose: 0-12 Units     Route: Subcutaneous  Review of Systems:  Focused review of system was completed. Refer to the HPI for ROS details.     Today's Physical Exam:  Physical Exam  Constitutional:       Appearance: Normal appearance.   Cardiovascular:      Rate and Rhythm: Normal rate and regular rhythm.   Pulmonary:      Effort: Pulmonary effort is normal.      Breath sounds: Normal breath sounds.   Abdominal:      General: Abdomen is  flat.      Palpations: Abdomen is soft.   Skin:     General: Skin is warm and dry.   Neurological:      Mental Status: She is alert and oriented to person, place, and time.   Psychiatric:         Mood and Affect: Mood normal.          I/O:  I/O last 24 hours:    Intake/Output Summary (Last 24 hours) at 12/06/2022 1601  Last data filed at 12/06/2022 1048  Gross per 24 hour   Intake 3925 ml   Output --   Net 3925 ml     I/O current shift:  12/16 0700 - 12/16 1859  In: 250   Out: -     Labs  Please indicate ordered or reviewed)  Reviewed: I have reviewed all lab results.    Problem List:  Active Hospital Problems   (*Primary Problem)    Diagnosis    *Acute respiratory failure due to COVID-19 (CMS HCC)    Diabetes mellitus, type 2 (CMS HCC)       Assessment/ Plan:     Acute respiratory failure with hypoxia likely secondary to COVID-19: Continue with remdesivir, IV antibiotics, dexamethasone.  Continue titrate oxygen.    Carron Brazen, DO      DVT/PE Prophylaxis: Heparin    Disposition Planning: Home discharge      Advance Care Planning Discussed:  No

## 2022-12-06 NOTE — ED Nurses Note (Signed)
Nurse in to see patient. Pt and husband stating that he wants another covid test to say if pt still has covid and that patient is still in the ER and doesn't have a bed. Pt was informed that I could get ahold of the treating physician and ask for another covid test but the probability of a negative test at this point in treatment is very unlikely. Pt also stated pt has been in ER and that we need to get her a bed. Pt was informed that there are no beds available upstairs and that patient is still receiving treatment. Nurse asked pt and family if they would like to speak with charge nurse which patient denied at this time. Meal tray provided at this time as well. Call bell in reach and pt directed to hit call bell if they needed anything.

## 2022-12-06 NOTE — ED Nurses Note (Signed)
Pt resting quietly in bed with eyes closed.  Ntd O2 drop to 86-88%.  Discussed with MD.  Placed on 2 L NC O2.  Improved to 95

## 2022-12-06 NOTE — ED Nurses Note (Signed)
Pt arrived to [facility] ED via [facility]. No distress or issues during transfer. Pt placed in Room 20 via EMS.

## 2022-12-06 NOTE — Care Plan (Signed)
Problem: Adult Inpatient Plan of Care  Goal: Plan of Care Review  Outcome: Ongoing (see interventions/notes)  Goal: Patient-Specific Goal (Individualized)  Outcome: Ongoing (see interventions/notes)  Flowsheets (Taken 12/06/2022 1800)  Individualized Care Needs: Telemetry, IV antibiotics  Anxieties, Fears or Concerns: none voiced  Patient-Specific Goals (Include Timeframe): "Get rid of this dizziness"  Goal: Absence of Hospital-Acquired Illness or Injury  Outcome: Ongoing (see interventions/notes)  Intervention: Identify and Manage Fall Risk  Recent Flowsheet Documentation  Taken 12/06/2022 1800 by Osborn Coho, RN  Safety Promotion/Fall Prevention:   activity supervised   fall prevention program maintained   nonskid shoes/slippers when out of bed   safety round/check completed  Intervention: Prevent Infection  Recent Flowsheet Documentation  Taken 12/06/2022 1800 by Osborn Coho, RN  Infection Prevention:   rest/sleep promoted   promote handwashing  Goal: Optimal Comfort and Wellbeing  Outcome: Ongoing (see interventions/notes)  Goal: Rounds/Family Conference  Outcome: Ongoing (see interventions/notes)     Problem: Health Knowledge, Opportunity to Enhance (Adult,Obstetrics,Pediatric)  Goal: Knowledgeable about Health Subject/Topic  Description: Patient will demonstrate the desired outcomes by discharge/transition of care.  Outcome: Ongoing (see interventions/notes)     Problem: Fall Injury Risk  Goal: Absence of Fall and Fall-Related Injury  Outcome: Ongoing (see interventions/notes)  Intervention: Promote Injury-Free Environment  Recent Flowsheet Documentation  Taken 12/06/2022 1800 by Osborn Coho, RN  Safety Promotion/Fall Prevention:   activity supervised   fall prevention program maintained   nonskid shoes/slippers when out of bed   safety round/check completed     Problem: Gas Exchange Impaired  Goal: Optimal Gas Exchange  Outcome: Ongoing (see interventions/notes)     Problem: Diarrhea  Goal: Effective  Diarrhea Management  Outcome: Ongoing (see interventions/notes)

## 2022-12-07 ENCOUNTER — Inpatient Hospital Stay (HOSPITAL_COMMUNITY): Payer: Medicare Other

## 2022-12-07 ENCOUNTER — Other Ambulatory Visit: Payer: Self-pay

## 2022-12-07 LAB — POC BLOOD GLUCOSE (RESULTS)
GLUCOSE, POC: 193 mg/dl (ref 50–500)
GLUCOSE, POC: 270 mg/dl (ref 50–500)
GLUCOSE, POC: 334 mg/dl (ref 50–500)
GLUCOSE, POC: 337 mg/dl (ref 50–500)

## 2022-12-07 LAB — CBC WITH DIFF
BASOPHIL #: 0 10*3/uL (ref 0.00–0.10)
BASOPHIL %: 1 % (ref 0–1)
EOSINOPHIL #: 0 10*3/uL (ref 0.00–0.50)
EOSINOPHIL %: 0 % — ABNORMAL LOW
HCT: 36.8 % (ref 31.2–41.9)
HGB: 12.2 g/dL (ref 10.9–14.3)
LYMPHOCYTE #: 1 10*3/uL (ref 1.00–3.00)
LYMPHOCYTE %: 18 % (ref 16–44)
MCH: 32.8 pg (ref 24.7–32.8)
MCHC: 33.2 g/dL (ref 32.3–35.6)
MCV: 98.8 fL — ABNORMAL HIGH (ref 75.5–95.3)
MONOCYTE #: 0.5 10*3/uL (ref 0.30–1.00)
MONOCYTE %: 9 % (ref 5–13)
MPV: 10.6 fL (ref 7.9–10.8)
NEUTROPHIL #: 4.1 10*3/uL (ref 1.85–7.80)
NEUTROPHIL %: 72 % (ref 43–77)
PLATELETS: 154 10*3/uL (ref 140–440)
RBC: 3.73 10*6/uL (ref 3.63–4.92)
RDW: 13.7 % (ref 12.3–17.7)
WBC: 5.7 10*3/uL (ref 3.8–11.8)

## 2022-12-07 LAB — COMPREHENSIVE METABOLIC PANEL, NON-FASTING
ALBUMIN/GLOBULIN RATIO: 1.2 (ref 0.8–1.4)
ALBUMIN: 3.7 g/dL (ref 3.5–5.7)
ALKALINE PHOSPHATASE: 57 U/L (ref 34–104)
ALT (SGPT): 10 U/L (ref 7–52)
ANION GAP: 11 mmol/L (ref 4–13)
AST (SGOT): 14 U/L (ref 13–39)
BILIRUBIN TOTAL: 0.3 mg/dL (ref 0.3–1.2)
BUN/CREA RATIO: 17 (ref 6–22)
BUN: 16 mg/dL (ref 7–25)
CALCIUM, CORRECTED: 8.8 mg/dL — ABNORMAL LOW (ref 8.9–10.8)
CALCIUM: 8.6 mg/dL (ref 8.6–10.3)
CHLORIDE: 107 mmol/L (ref 98–107)
CO2 TOTAL: 20 mmol/L — ABNORMAL LOW (ref 21–31)
CREATININE: 0.93 mg/dL (ref 0.60–1.30)
ESTIMATED GFR: 65 mL/min/{1.73_m2} (ref >59)
GLOBULIN: 3.2 (ref 2.9–5.4)
GLUCOSE: 208 mg/dL — ABNORMAL HIGH (ref 74–109)
OSMOLALITY, CALCULATED: 283 mOsm/kg (ref 270–290)
POTASSIUM: 4.5 mmol/L (ref 3.5–5.1)
PROTEIN TOTAL: 6.9 g/dL (ref 6.4–8.9)
SODIUM: 138 mmol/L (ref 136–145)

## 2022-12-07 MED ORDER — ACETAMINOPHEN 325 MG TABLET
650.0000 mg | ORAL_TABLET | ORAL | Status: DC | PRN
Start: 2022-12-07 — End: 2022-12-10
  Administered 2022-12-07 – 2022-12-08 (×2): 650 mg via ORAL
  Filled 2022-12-07 (×2): qty 2

## 2022-12-07 MED ORDER — VENLAFAXINE ER 37.5 MG CAPSULE,EXTENDED RELEASE 24 HR
37.5000 mg | ORAL_CAPSULE | Freq: Every evening | ORAL | Status: DC
Start: 2022-12-07 — End: 2022-12-10
  Administered 2022-12-07 – 2022-12-09 (×3): 37.5 mg via ORAL
  Filled 2022-12-07 (×3): qty 1

## 2022-12-07 MED ORDER — ATORVASTATIN 20 MG TABLET
20.0000 mg | ORAL_TABLET | Freq: Every evening | ORAL | Status: DC
Start: 2022-12-07 — End: 2022-12-10
  Administered 2022-12-07 – 2022-12-09 (×3): 20 mg via ORAL
  Filled 2022-12-07 (×3): qty 1

## 2022-12-07 MED ORDER — LEVETIRACETAM 500 MG/5 ML (5 ML) ORAL SOLUTION
250.0000 mg | Freq: Two times a day (BID) | ORAL | Status: DC
Start: 2022-12-08 — End: 2022-12-08
  Filled 2022-12-07 (×2): qty 5

## 2022-12-07 MED ORDER — OXYBUTYNIN CHLORIDE 5 MG TABLET
5.0000 mg | ORAL_TABLET | Freq: Three times a day (TID) | ORAL | Status: DC
Start: 2022-12-07 — End: 2022-12-10
  Administered 2022-12-07 – 2022-12-10 (×10): 5 mg via ORAL
  Filled 2022-12-07 (×10): qty 1

## 2022-12-07 MED ORDER — SODIUM CHLORIDE 0.9 % INTRAVENOUS SOLUTION
INTRAVENOUS | Status: DC
Start: 2022-12-07 — End: 2022-12-10

## 2022-12-07 MED ORDER — LOPERAMIDE 2 MG CAPSULE
2.0000 mg | ORAL_CAPSULE | ORAL | Status: DC | PRN
Start: 2022-12-07 — End: 2022-12-10
  Administered 2022-12-07 – 2022-12-08 (×3): 2 mg via ORAL
  Filled 2022-12-07 (×3): qty 1

## 2022-12-07 MED ORDER — MAGNESIUM OXIDE 400 MG (241.3 MG MAGNESIUM) TABLET
800.0000 mg | ORAL_TABLET | Freq: Every day | ORAL | Status: DC
Start: 2022-12-07 — End: 2022-12-10
  Administered 2022-12-07 – 2022-12-10 (×4): 800 mg via ORAL
  Filled 2022-12-07 (×4): qty 2

## 2022-12-07 MED ORDER — LEVETIRACETAM ER 500 MG TABLET,EXTENDED RELEASE 24 HR
1.0000 | ORAL_TABLET | Freq: Every evening | ORAL | Status: DC
Start: 2022-12-07 — End: 2022-12-08
  Administered 2022-12-07: 0 mg via ORAL

## 2022-12-07 NOTE — Nurses Notes (Signed)
Went in to check on patient, patient attempted to re-position herself independently and increased weakness is noted. Patient noted to be pale in face. Vitals are stable. Orthostatics obtained, patient reports dizziness. States she has been having dizzy spells x1 month. Provider beeped. Orders placed.

## 2022-12-07 NOTE — Nurses Notes (Signed)
Informed T Prescott PA-C of patient's request for pain medication for generalized aches and pains, and request for medication for diarrhea.

## 2022-12-07 NOTE — Care Plan (Signed)
Problem: Fall Injury Risk  Goal: Absence of Fall and Fall-Related Injury  Intervention: Identify and Manage Contributors  Recent Flowsheet Documentation  Taken 12/07/2022 2303 by Georgette Dover, RN  Medication Review/Management: medications reviewed     Problem: Diarrhea  Goal: Effective Diarrhea Management  Intervention: Manage Diarrhea  Flowsheets (Taken 12/07/2022 2303)  Medication Review/Management: medications reviewed   Denies pain. 2 L at night. Consulted provider about medications. No dizzy spells noted thus far.

## 2022-12-07 NOTE — Progress Notes (Signed)
[facility]  IP PROGRESS NOTE      Grace Hoffman, Grace Hoffman  Date of Admission:  12/05/2022  Date of Birth:  27-Dec-1950  Date of Service:  12/07/2022    Hospital Day:  LOS: 1 day     Subjective:   Patient seen examined for follow-up of acute respiratory failure with hypoxia, COVID-19.  Patient is now on room air with oxygen saturations in the mid 90s.  She reports increased diarrhea and abdominal discomfort overnight.    Vital Signs:  Temp (24hrs) Max:36.8 C (98.3 F)      Temperature: 36.1 C (97 F)  BP (Non-Invasive): 126/75  MAP (Non-Invasive): 90 mmHG  Heart Rate: 95  Respiratory Rate: 16  SpO2: 98 %    Current Medications:  acetaminophen (TYLENOL) tablet, 650 mg, Oral, Q4H PRN  albuterol 90 mcg per inhalation oral inhaler - "Respiratory to administer", 2 Puff, Inhalation, Q6H PRN  atorvastatin (LIPITOR) tablet, 20 mg, Oral, NIGHTLY  azithromycin (ZITHROMAX) 500 mg in NS 250 mL IVPB, 500 mg, Intravenous, Q24H  cefTRIAXone (ROCEPHIN) 1 g in NS 50 mL IVPB minibag, 1 g, Intravenous, Q24H  dexAMETHasone (PF) 10 mg/mL injection, 6 mg, Intravenous, Daily  dextrose 50% (0.5 g/mL) injection - syringe, 25 g, Intravenous, Q15 Min PRN   Or  dextrose 50% (0.5 g/mL) injection - syringe, 12.5 g, Intravenous, Q15 Min PRN   Or  glucagon (GLUCAGEN DIAGNOSTIC KIT) injection 1 mg, 1 mg, Subcutaneous, Q15 Min PRN   Or  glucagon (GLUCAGEN DIAGNOSTIC KIT) injection 1 mg, 1 mg, IntraMUSCULAR, Q15 Min PRN  heparin 5,000 unit/mL injection, 5,000 Units, Subcutaneous, Q8HRS  Levetiracetam Tablet Sustained Release 24 hr 500 mg, 1 Tablet, Oral, NIGHTLY  loperamide (IMODIUM) capsule, 2 mg, Oral, Q4H PRN  magnesium oxide (MAG-OX) 448m (2419melemental magnesium) tablet, 800 mg, Oral, Daily  NS flush syringe, 3 mL, Intracatheter, Q8HRS  NS flush syringe, 3 mL, Intracatheter, Q1H PRN  oxyBUTYnin (DITROPAN) tablet, 5 mg, Oral, 3x/day  remdesivir 100 mg in NS 250 mL (tot vol) IVPB, 100 mg,  Intravenous, Q24H  SSIP insulin R human (HumuLIN R) 100 units/mL injection, 0-12 Units, Subcutaneous, 4x/day AC  venlafaxine (EFFEXOR XR) 24 hr extended release capsule, 37.5 mg, Oral, NIGHTLY        Current Orders:  Active Orders   Diet    DIET CARDIAC (2G NA, LOWFAT, LOW CHOL) Do you want to initiate MNT Protocol? Yes     Frequency: All Meals     Number of Occurrences: 1 Occurrences   Nursing    TELEMETRY MONITORING - Continuous     Frequency: CONTINUOUS     Number of Occurrences: Until Specified   Code Status    FULL CODE     Frequency: CONTINUOUS     Number of Occurrences: Until Specified   Isolation    ENHANCED DROPLET & CONTACT ISOLATION     Frequency: CONTINUOUS     Number of Occurrences: Until Specified     Order Comments: Private room  Wear a fitted N95/MSA/CAPR when entering room  Wear gown and gloves when entering room  Wear eye protection if exposure anticipated  Prior to transport, notify receiving department of precautions  Prior to transport, ensure patient is wearing a mask and follows Respiratory Hygiene/Cough Etiquette  Provide patient and family education regarding isolation    For the CDC's complete guidelines on contact isolation, see page 8463f hthttps://wu.com/df     PT  PT EVALUATE AND TREAT     Frequency: Per Therapist Discretion     Number of Occurrences: 1 Occurrences     Order Comments: If patient's O2 level drops, PT may increase O2 until sats are > 93%.       Scheduling Instructions:      Isolation Status:  Enhanced Droplet & Contact         Respiratory Care    OXYGEN - NASAL CANNULA     Frequency: CONTINUOUS     Number of Occurrences: Until Specified     Order Comments: Flowrate should not exeed 6L/min except [facility] facility.          Point of Care Testing    PERFORM POC WHOLE BLOOD GLUCOSE     Frequency: TID AC & HS     Number of Occurrences: Until Specified    PERFORM POC WHOLE BLOOD GLUCOSE     Frequency: TID AC & HS     Number of Occurrences:  Until Specified   IV    INSERT & MAINTAIN PERIPHERAL IV ACCESS     Frequency: UNTIL DISCONTINUED     Number of Occurrences: Until Specified    PERIPHERAL IV DRESSING CHANGE     Frequency: PRN     Number of Occurrences: Until Specified   Medications    acetaminophen (TYLENOL) tablet     Frequency: Q4H PRN     Dose: 650 mg     Route: Oral    albuterol 90 mcg per inhalation oral inhaler - "Respiratory to administer"     Frequency: Q6H PRN     Dose: 2 Puff     Route: Inhalation    atorvastatin (LIPITOR) tablet     Frequency: NIGHTLY     Dose: 20 mg     Route: Oral    azithromycin (ZITHROMAX) 500 mg in NS 250 mL IVPB     Frequency: Q24H     Dose: 500 mg     Route: Intravenous    cefTRIAXone (ROCEPHIN) 1 g in NS 50 mL IVPB minibag     Frequency: Q24H     Dose: 1 g     Route: Intravenous    dexAMETHasone (PF) 10 mg/mL injection     Frequency: Daily     Dose: 6 mg     Route: Intravenous    dextrose 50% (0.5 g/mL) injection - syringe     Linked Order: Or     Frequency: Q15 Min PRN     Dose: 25 g     Route: Intravenous    dextrose 50% (0.5 g/mL) injection - syringe     Linked Order: Or     Frequency: Q15 Min PRN     Dose: 12.5 g     Route: Intravenous    glucagon (GLUCAGEN DIAGNOSTIC KIT) injection 1 mg     Linked Order: Or     Frequency: Q15 Min PRN     Dose: 1 mg     Route: Subcutaneous    glucagon (GLUCAGEN DIAGNOSTIC KIT) injection 1 mg     Linked Order: Or     Frequency: Q15 Min PRN     Dose: 1 mg     Route: IntraMUSCULAR    heparin 5,000 unit/mL injection     Frequency: Q8HRS     Dose: 5,000 Units     Route: Subcutaneous    Levetiracetam Tablet Sustained Release 24 hr 500 mg     Frequency: NIGHTLY  Dose: 1 Tablet     Route: Oral    loperamide (IMODIUM) capsule     Frequency: Q4H PRN     Dose: 2 mg     Route: Oral    magnesium oxide (MAG-OX) 447m (2483melemental magnesium) tablet     Frequency: Daily     Dose: 800 mg     Route: Oral    NS flush syringe     Frequency: Q8HRS     Dose: 3 mL     Route: Intracatheter     NS flush syringe     Frequency: Q1H PRN     Dose: 3 mL     Route: Intracatheter    oxyBUTYnin (DITROPAN) tablet     Frequency: 3x/day     Dose: 5 mg     Route: Oral    remdesivir 100 mg in NS 250 mL (tot vol) IVPB     Frequency: Q24H     Dose: 100 mg     Route: Intravenous    SSIP insulin R human (HumuLIN R) 100 units/mL injection     Frequency: 4x/day AC     Dose: 0-12 Units     Route: Subcutaneous    venlafaxine (EFFEXOR XR) 24 hr extended release capsule     Frequency: NIGHTLY     Dose: 37.5 mg     Route: Oral        Review of Systems:  Focused review of system was completed. Refer to the HPI for ROS details.     Today's Physical Exam:  Physical Exam  Constitutional:       Appearance: Normal appearance.   Cardiovascular:      Rate and Rhythm: Normal rate and regular rhythm.   Pulmonary:      Effort: Pulmonary effort is normal.      Breath sounds: Normal breath sounds.   Abdominal:      General: Abdomen is flat.      Palpations: Abdomen is soft.   Skin:     General: Skin is warm and dry.   Neurological:      Mental Status: She is alert and oriented to person, place, and time.   Psychiatric:         Mood and Affect: Mood normal.          I/O:  I/O last 24 hours:    Intake/Output Summary (Last 24 hours) at 12/07/2022 1002  Last data filed at 12/07/2022 0646  Gross per 24 hour   Intake 500 ml   Output --   Net 500 ml     I/O current shift:  No intake/output data recorded.    Labs  Please indicate ordered or reviewed)  Reviewed: I have reviewed all lab results.    Problem List:  Active Hospital Problems   (*Primary Problem)    Diagnosis    *COVID-19    Diabetes mellitus, type 2 (CMS HCC)       Assessment/ Plan:     Acute respiratory failure with hypoxia likely secondary to COVID-19: resolved, Continue with remdesivir, IV antibiotics, dexamethasone.      Diarrhea/abdominal pain likely secondary to COVID-19: KUB pending, continue with p.r.n. Imodium    RaCarron BrazenDO      DVT/PE Prophylaxis: Heparin    Disposition  Planning: Home discharge      Advance Care Planning Discussed:  No

## 2022-12-08 ENCOUNTER — Inpatient Hospital Stay (HOSPITAL_COMMUNITY): Payer: Medicare Other

## 2022-12-08 DIAGNOSIS — R42 Dizziness and giddiness: Secondary | ICD-10-CM

## 2022-12-08 DIAGNOSIS — J9601 Acute respiratory failure with hypoxia: Secondary | ICD-10-CM

## 2022-12-08 DIAGNOSIS — R197 Diarrhea, unspecified: Secondary | ICD-10-CM

## 2022-12-08 DIAGNOSIS — R55 Syncope and collapse: Secondary | ICD-10-CM

## 2022-12-08 DIAGNOSIS — I10 Essential (primary) hypertension: Secondary | ICD-10-CM

## 2022-12-08 DIAGNOSIS — U071 COVID-19: Principal | ICD-10-CM

## 2022-12-08 DIAGNOSIS — R109 Unspecified abdominal pain: Secondary | ICD-10-CM

## 2022-12-08 LAB — POC BLOOD GLUCOSE (RESULTS)
GLUCOSE, POC: 174 mg/dl (ref 50–500)
GLUCOSE, POC: 264 mg/dl (ref 50–500)
GLUCOSE, POC: 409 mg/dl (ref 50–500)
GLUCOSE, POC: 360 mg/dl (ref 50–500)
GLUCOSE, POC: 304 mg/dl (ref 50–500)

## 2022-12-08 MED ORDER — MECLIZINE 25 MG TABLET
25.0000 mg | ORAL_TABLET | Freq: Four times a day (QID) | ORAL | Status: DC
Start: 2022-12-08 — End: 2022-12-10
  Administered 2022-12-08 – 2022-12-10 (×8): 25 mg via ORAL
  Filled 2022-12-08 (×8): qty 1

## 2022-12-08 MED ORDER — LACTOBACILLUS RHAMNOSUS GG 10 BILLION CELL CAPSULE
1.0000 | ORAL_CAPSULE | Freq: Two times a day (BID) | ORAL | Status: DC
Start: 2022-12-08 — End: 2022-12-10
  Administered 2022-12-08 – 2022-12-10 (×4): 1 via ORAL
  Filled 2022-12-08 (×4): qty 1

## 2022-12-08 MED ORDER — DIAZEPAM 5 MG TABLET
5.0000 mg | ORAL_TABLET | Freq: Every day | ORAL | Status: DC
Start: 2022-12-08 — End: 2022-12-10
  Administered 2022-12-08 – 2022-12-10 (×3): 5 mg via ORAL
  Filled 2022-12-08 (×3): qty 1

## 2022-12-08 MED ORDER — SODIUM CHLORIDE 0.9 % INJECTION SOLUTION
2.0000 mL | Freq: Once | INTRAVENOUS | Status: DC | PRN
Start: 2022-12-08 — End: 2022-12-10
  Administered 2022-12-08: 2 mL via INTRAVENOUS

## 2022-12-08 MED ORDER — HYDROCODONE 7.5 MG-ACETAMINOPHEN 325 MG TABLET
1.0000 | ORAL_TABLET | Freq: Four times a day (QID) | ORAL | Status: DC | PRN
Start: 2022-12-08 — End: 2022-12-10
  Administered 2022-12-08 (×2): 1 via ORAL
  Filled 2022-12-08 (×2): qty 1

## 2022-12-08 MED ORDER — LEVETIRACETAM 500 MG TABLET
250.0000 mg | ORAL_TABLET | Freq: Two times a day (BID) | ORAL | Status: DC
Start: 2022-12-08 — End: 2022-12-10
  Administered 2022-12-08 – 2022-12-10 (×6): 250 mg via ORAL
  Filled 2022-12-08 (×6): qty 1

## 2022-12-08 NOTE — Progress Notes (Signed)
[facility]  IP PROGRESS NOTE      Grace Hoffman, Grace Hoffman  Date of Admission:  12/05/2022  Date of Birth:  Jul 19, 1951  Date of Service:  12/08/2022    Hospital Day:  LOS: 2 days     Subjective:   Patient seen examined for follow-up of acute respiratory failure with hypoxia, COVID-19.  Patient is now on room air with oxygen saturations in the mid 90s.  Patient had episode of dizziness yesterday afternoon and felt pre-syncopal.    Vital Signs:  Temp (24hrs) Max:37.3 C (99.1 F)      Temperature: 36 C (96.8 F)  BP (Non-Invasive): (!) 157/82  MAP (Non-Invasive): 103 mmHG  Heart Rate: 68  Respiratory Rate: 20  SpO2: 96 %    Current Medications:  acetaminophen (TYLENOL) tablet, 650 mg, Oral, Q4H PRN  albuterol 90 mcg per inhalation oral inhaler - "Respiratory to administer", 2 Puff, Inhalation, Q6H PRN  atorvastatin (LIPITOR) tablet, 20 mg, Oral, NIGHTLY  azithromycin (ZITHROMAX) 500 mg in NS 250 mL IVPB, 500 mg, Intravenous, Q24H  cefTRIAXone (ROCEPHIN) 1 g in NS 50 mL IVPB minibag, 1 g, Intravenous, Q24H  dexAMETHasone (PF) 10 mg/mL injection, 6 mg, Intravenous, Daily  dextrose 50% (0.5 g/mL) injection - syringe, 25 g, Intravenous, Q15 Min PRN   Or  dextrose 50% (0.5 g/mL) injection - syringe, 12.5 g, Intravenous, Q15 Min PRN   Or  glucagon (GLUCAGEN DIAGNOSTIC KIT) injection 1 mg, 1 mg, Subcutaneous, Q15 Min PRN   Or  glucagon (GLUCAGEN DIAGNOSTIC KIT) injection 1 mg, 1 mg, IntraMUSCULAR, Q15 Min PRN  heparin 5,000 unit/mL injection, 5,000 Units, Subcutaneous, Q8HRS  HYDROcodone-acetaminophen (NORCO) 7.5 mg-325 mg per tablet, 1 Tablet, Oral, Q6H PRN  lactobacillus rhamnosus (CULTURELLE) active cultures capsule, 1 Capsule, Oral, 2x/day-Food  levETIRAcetam (KEPPRA) tablet, 250 mg, Oral, 2x/day  loperamide (IMODIUM) capsule, 2 mg, Oral, Q4H PRN  magnesium oxide (MAG-OX) 41m (2462melemental magnesium) tablet, 800 mg, Oral, Daily  meclizine (ANTIVERT) tablet, 25  mg, Oral, Q6HRS  NS flush syringe, 3 mL, Intracatheter, Q8HRS  NS flush syringe, 3 mL, Intracatheter, Q1H PRN  NS premix infusion, , Intravenous, Continuous  oxyBUTYnin (DITROPAN) tablet, 5 mg, Oral, 3x/day  remdesivir 100 mg in NS 250 mL (tot vol) IVPB, 100 mg, Intravenous, Q24H  SSIP insulin R human (HumuLIN R) 100 units/mL injection, 0-12 Units, Subcutaneous, 4x/day AC  venlafaxine (EFFEXOR XR) 24 hr extended release capsule, 37.5 mg, Oral, NIGHTLY        Current Orders:  Active Orders   Imaging    MRI BRAIN WO CONTRAST     Frequency: ONE TIME     Number of Occurrences: 1 Occurrences     Scheduling Instructions:      Isolation Status:  Enhanced Droplet         Lab    CBC WITH DIFF     Frequency: Once     Number of Occurrences: 1 Occurrences    CBC/DIFF     Frequency: 0530 - AM DRAW     Number of Occurrences: 1 Occurrences    COMPREHENSIVE METABOLIC PANEL, NON-FASTING     Frequency: 0530 - AM DRAW     Number of Occurrences: 1 Occurrences   Diet    DIET CARDIAC (2G NA, LOWFAT, LOW CHOL) Do you want to initiate MNT Protocol? Yes     Frequency: All Meals     Number of Occurrences: 1 Occurrences   Nursing  TELEMETRY MONITORING - Continuous     Frequency: CONTINUOUS     Number of Occurrences: Until Specified   Code Status    FULL CODE     Frequency: CONTINUOUS     Number of Occurrences: Until Specified   Isolation    ENHANCED DROPLET ISOLATION     Frequency: CONTINUOUS     Number of Occurrences: Until Specified     Order Comments: Private room  Wear a fitted N95/MSA/CAPR when entering room  Wear gown, gloves, and eye protection as indicated  Prior to transport, notify receiving department of precautions  Prior to transport, ensure patient is wearing a mask and follows Respiratory Hygiene/Cough Etiquette         PT    PT EVALUATE AND TREAT     Frequency: Per Therapist Discretion     Number of Occurrences: 1 Occurrences     Order Comments: If patient's O2 level drops, PT may increase O2 until sats are > 93%.        Scheduling Instructions:      Isolation Status:  Enhanced Droplet & Contact         Respiratory Care    OXYGEN - NASAL CANNULA     Frequency: CONTINUOUS     Number of Occurrences: Until Specified     Order Comments: Flowrate should not exeed 6L/min except [facility] facility.          Point of Care Testing    PERFORM POC WHOLE BLOOD GLUCOSE     Frequency: TID AC & HS     Number of Occurrences: Until Specified    PERFORM POC WHOLE BLOOD GLUCOSE     Frequency: TID AC & HS     Number of Occurrences: Until Specified   ECHO    TRANSTHORACIC ECHOCARDIOGRAM - ADULT     Frequency: ONE TIME     Number of Occurrences: 1 Occurrences     Order Comments: Assess lvef       Scheduling Instructions:      Isolation Status:  Enhanced Droplet & Contact         IV    INSERT & MAINTAIN PERIPHERAL IV ACCESS     Frequency: UNTIL DISCONTINUED     Number of Occurrences: Until Specified    PERIPHERAL IV DRESSING CHANGE     Frequency: PRN     Number of Occurrences: Until Specified   Medications    acetaminophen (TYLENOL) tablet     Frequency: Q4H PRN     Dose: 650 mg     Route: Oral    albuterol 90 mcg per inhalation oral inhaler - "Respiratory to administer"     Frequency: Q6H PRN     Dose: 2 Puff     Route: Inhalation    atorvastatin (LIPITOR) tablet     Frequency: NIGHTLY     Dose: 20 mg     Route: Oral    azithromycin (ZITHROMAX) 500 mg in NS 250 mL IVPB     Frequency: Q24H     Dose: 500 mg     Route: Intravenous    cefTRIAXone (ROCEPHIN) 1 g in NS 50 mL IVPB minibag     Frequency: Q24H     Dose: 1 g     Route: Intravenous    dexAMETHasone (PF) 10 mg/mL injection     Frequency: Daily     Dose: 6 mg     Route: Intravenous    dextrose 50% (0.5 g/mL) injection - syringe  Linked Order: Or     Frequency: Q15 Min PRN     Dose: 25 g     Route: Intravenous    dextrose 50% (0.5 g/mL) injection - syringe     Linked Order: Or     Frequency: Q15 Min PRN     Dose: 12.5 g     Route: Intravenous    glucagon (GLUCAGEN DIAGNOSTIC KIT) injection 1 mg      Linked Order: Or     Frequency: Q15 Min PRN     Dose: 1 mg     Route: Subcutaneous    glucagon (GLUCAGEN DIAGNOSTIC KIT) injection 1 mg     Linked Order: Or     Frequency: Q15 Min PRN     Dose: 1 mg     Route: IntraMUSCULAR    heparin 5,000 unit/mL injection     Frequency: Q8HRS     Dose: 5,000 Units     Route: Subcutaneous    HYDROcodone-acetaminophen (NORCO) 7.5 mg-325 mg per tablet     Frequency: Q6H PRN     Dose: 1 Tablet     Route: Oral    lactobacillus rhamnosus (CULTURELLE) active cultures capsule     Frequency: 2x/day-Food     Dose: 1 Capsule     Route: Oral    levETIRAcetam (KEPPRA) tablet     Frequency: 2x/day     Dose: 250 mg     Route: Oral    loperamide (IMODIUM) capsule     Frequency: Q4H PRN     Dose: 2 mg     Route: Oral    magnesium oxide (MAG-OX) 451m (2468melemental magnesium) tablet     Frequency: Daily     Dose: 800 mg     Route: Oral    meclizine (ANTIVERT) tablet     Frequency: Q6HRS     Dose: 25 mg     Route: Oral    NS flush syringe     Frequency: Q8HRS     Dose: 3 mL     Route: Intracatheter    NS flush syringe     Frequency: Q1H PRN     Dose: 3 mL     Route: Intracatheter    NS premix infusion     Frequency: Continuous     Route: Intravenous    oxyBUTYnin (DITROPAN) tablet     Frequency: 3x/day     Dose: 5 mg     Route: Oral    remdesivir 100 mg in NS 250 mL (tot vol) IVPB     Frequency: Q24H     Dose: 100 mg     Route: Intravenous    SSIP insulin R human (HumuLIN R) 100 units/mL injection     Frequency: 4x/day AC     Dose: 0-12 Units     Route: Subcutaneous    venlafaxine (EFFEXOR XR) 24 hr extended release capsule     Frequency: NIGHTLY     Dose: 37.5 mg     Route: Oral        Review of Systems:  Focused review of system was completed. Refer to the HPI for ROS details.     Today's Physical Exam:  Physical Exam  Constitutional:       Appearance: Normal appearance.   Cardiovascular:      Rate and Rhythm: Normal rate and regular rhythm.   Pulmonary:      Effort: Pulmonary effort is  normal.      Breath sounds: Normal  breath sounds.   Abdominal:      General: Abdomen is flat.      Palpations: Abdomen is soft.   Skin:     General: Skin is warm and dry.   Neurological:      Mental Status: She is alert and oriented to person, place, and time.   Psychiatric:         Mood and Affect: Mood normal.          I/O:  I/O last 24 hours:    Intake/Output Summary (Last 24 hours) at 12/08/2022 1437  Last data filed at 12/07/2022 1800  Gross per 24 hour   Intake 660 ml   Output --   Net 660 ml     I/O current shift:  No intake/output data recorded.    Labs  Please indicate ordered or reviewed)  Reviewed: I have reviewed all lab results.    Problem List:  Active Hospital Problems   (*Primary Problem)    Diagnosis    *Acute respiratory failure due to COVID-19 (CMS HCC)    Syncope    Vertigo    Diabetes mellitus, type 2 (CMS HCC)       Assessment/ Plan:     Acute respiratory failure with hypoxia likely secondary to COVID-19: resolved, Continue with remdesivir, IV antibiotics, dexamethasone.      Diarrhea/abdominal pain likely secondary to COVID-19: KUB unremarkable, continue with p.r.n. Imodium.  Probiotic pending    Dizziness/presyncope:  head ct and carotid US WNL, brain MRI Pending.  Antivert started for possible vertigo.  Brain MRI and echo pending.    Carron Brazen, DO      DVT/PE Prophylaxis: Heparin    Disposition Planning: Home discharge      Advance Care Planning Discussed:  No

## 2022-12-08 NOTE — Nurses Notes (Signed)
Informed T Prescott PA-C that patient's home medication of Keppra sustained release is non-formulary-per pharmacy.

## 2022-12-08 NOTE — Care Management Notes (Signed)
Grace Hoffman Management Initial Evaluation    Patient Name: Grace Hoffman  Date of Birth: 12-31-1950  Sex: female  Date/Time of Admission: 12/05/2022  9:35 PM  Room/Bed: 323/A  Payor: MEDICARE / Plan: MEDICARE PART A AND B / Product Type: Medicare /   Primary Care Providers:  Bennie Dallas, PA-C (General)    Pharmacy Info:   Preferred [facility] [place], [place] - [address]    [place] [address]    Phone: [phone] Fax: [phone]    Hours: Not open 24 hours    [place], [place]    [place] [address]    Phone: [phone] Fax: [phone]    Hours: Not open 24 hours          Emergency Contact Info:   Extended Emergency Contact Information  Primary Emergency Contact: [facility]  Mobile Phone: [phone]  Relation: Husband  Preferred language: English  Interpreter needed? No    History:   [name] is a 71 y.o., female, admitted to [facility] on12/15/23    Height/Weight: 167.6 cm (_0 ) / 89.4 kg (197 lb)     LOS: 2 days   Admitting Diagnosis: COVID-19 [U07.1]    Assessment:      12/08/22 1202   Assessment Details   Assessment Type Admission   Date of Care Management Update 12/08/22   Insurance Information/Type   Insurance type Medicare   Employment/Financial   Financial Concerns none   Living Environment   Select an age group to open "lives with" row.  Adult   Lives With spouse   Home Safety   Home Accessibility no concerns   Care Management Plan   Discharge Planning Status initial meeting   Discharge plan discussed with: Patient   CM will evaluate for rehabilitation potential yes   Patient choice offered to patient/family yes   Discharge Needs Assessment   Equipment Currently Used at Home walker, front wheeled;cane, straight;other (see comments)  (rolator)   Equipment Needed After Discharge none   Discharge  Facility/Level of Care Needs Home (Patient/Family Member/other)(code 1)   Transportation Available family or friend will provide   Referral Information   Admission Type inpatient   Arrived From home or self-care     Met with patient to discuss discharge plans. Patient states she lives with her husband in 1 story home with all necessary utilities and feels safe returning there.  Patient states she or her husband get her to doctor appointments and pick up her medications. Discussed HH or SNF with patient for short term rehab and patient refuses those thins at this time but says to check back with her at a later time. Patients goal is to return home upon discharge.     Discharge Plan:  Home (Patient/Family Member/other) (code 1)      The patient will continue to be evaluated for developing discharge needs.     Case Manager: Armando Reichert, RN  Phone: [phone]

## 2022-12-08 NOTE — PT Evaluation (Signed)
[facility]  [place], [address]  [phone]  [phone]  Rehabilitation Services  Physical Therapy Inpatient Initial Evaluation    Patient Name: Grace Hoffman  Date of Birth: [birth year]  Height: Height: 167.6 cm (5\' 6" )  Weight: Weight: 89.4 kg (197 lb)  Room/Bed: 323/A  Payor: MEDICARE / Plan: MEDICARE PART A AND B / Product Type: Medicare /       PMH:  Past Medical History:   Diagnosis Date    Anxiety     Chronic back pain     COVID-19 vaccine series completed     Depression     Diabetes mellitus, type 2 (CMS HCC)     Fibromyalgia affecting multiple sites     Hypertension     Hypomagnesemia     Insomnia disorder     Migraine     Mixed hyperlipidemia     Orthostatic hypotension     Osteoarthritis     TIA (transient ischemic attack)     Unspecified glaucoma(365.9)     Vitamin D deficiency            Assessment:      (P) 71 yo female admitted with covid. pt reports thatnormally she is indep with adls and used QC mostly for ambulation. lives with edlerly husband. today she reports doing OK. a bit weak and SOB. on supplimental oxygen. pts bed mobility and transfers cgax1. gait 50 ft in room with fww cgax1.  LE ROM/strength wfl.  pt will benefit fro PT for gait/transfers training    Discharge Needs:    Equipment Recommendation: (P) TBD      The patient presents with mobility limitations due to  impaired transfers/gait  that significantly impair/prevent patient's ability to participate in mobility-related activities of daily living (MRADLs) including  ambulation and transfers in order to safely complete. This functional mobility deficit can be sufficiently resolved with the use of a (P) TBD  in order to decrease the risk of falls, morbidity, and mortality in performance of these MRADLs.  Patient is able to safely use this assistive device.    Discharge Disposition: (P) home with assist, home with home health    JUSTIFICATION OF DISCHARGE RECOMMENDATION    Based on current diagnosis, functional performance prior to admission, and current functional performance, this patient requires continued PT services in (P) home with assist, home with home health in order to achieve significant functional improvements in these deficit areas:  .        Plan:   Current Intervention: (P) gait training, transfer training  To provide physical therapy services (P)  (1x daily at least 1 day weekly)  for duration of (P) until discharge.    The risks/benefits of therapy have been discussed with the patient/caregiver and he/she is in agreement with the established plan of care.       Subjective & Objective     Past Medical History:   Diagnosis Date    Anxiety     Chronic back pain     COVID-19 vaccine series completed     Depression     Diabetes mellitus, type 2 (CMS HCC)     Fibromyalgia affecting multiple sites     Hypertension     Hypomagnesemia     Insomnia disorder     Migraine     Mixed hyperlipidemia     Orthostatic hypotension     Osteoarthritis     TIA (transient ischemic  attack)     Unspecified glaucoma(365.9)     Vitamin D deficiency             Past Surgical History:   Procedure Laterality Date    HX CHOLECYSTECTOMY      HX KNEE REPLACMENT Left     HX ROTATOR CUFF REPAIR Left     SKIN CANCER EXCISION      forehead removed                 12/08/22 1540   Rehab Session   Document Type evaluation   Total PT Minutes: 12   Patient Effort good   Symptoms Noted During/After Treatment none   General Information   Patient Profile Reviewed yes   Mutuality/Individual Preferences   Anxieties, Fears or Concerns   (concerned about her ability to go home)   Living Environment   Lives With   (elderly husband)   Living Arrangements house   Home Accessibility   (3-4 steps to enter)   Functional Level Prior   Ambulation 1 - assistive equipment   Transferring 1 - assistive equipment   Bathing 0 - independent   Dressing 0 - independent   Eating 0 - independent   Prior Functional Level Comment pt  reports that she mostly use her quad cane but ocassionally rollator and fww   Cognitive Assessment/Interventions   Behavior/Mood Observations behavior appropriate to situation, WNL/WFL   Orientation Status oriented x 4   Pain Assessment   Pre/Posttreatment Pain Comment pt reports constand pain due to fibromyalgia   General Extremity Assessment   Comment LE ROM wfl with 4/5 strength   Bed Mobility Assessment/Treatment   Supine-Sit Independence contact guard assist   Sit to Supine, Independence contact guard assist   Transfer Assessment/Treatment   Sit-Stand Independence contact guard assist   Stand-Sit Independence contact guard assist   Bed-Chair Independence contact guard assist   Chair-Bed Independence contact guard assist   Gait Assessment/Treatment   Total Distance Ambulated 50   Assistive Device  walker, front wheeled   Physical Therapy Clinical Impression   Assessment 71 yo female admitted with covid. pt reports thatnormally she is indep with adls and used QC mostly for ambulation. lives with edlerly husband. today she reports doing OK. a bit weak and SOB. on supplimental oxygen. pts bed mobility and transfers cgax1. gait 50 ft in room with fww cgax1.  LE ROM/strength wfl.  pt will benefit fro PT for gait/transfers training   Criteria for Skilled Therapeutic yes   Rehab Potential fair   Therapy Frequency   (1x daily at least 1 day weekly)   Predicted Duration of Therapy Intervention (days/wks) until discharge   Anticipated Equipment Needs at Discharge (PT) TBD   Anticipated Discharge Disposition home with assist;home with home health   Evaluation Complexity Justification   Clinical Decision Making Low complexity   Evaluation Complexity Low complexity   Care Plan Goals   PT Rehab Goals Physical Therapy Goal   Physical Therapy Goal   PT  Goal, Date Established 12/08/22   PT Goal, Time to Achieve by discharge   PT Goal, Activity Type supine/sit/stand indep. ambulate 150 ft modI with fww   Planned Therapy  Interventions, PT Eval   Planned Therapy Interventions (PT) gait training;transfer training     (INSERT FLOWSHEET)            INTERVENTION MINUTES: EVALUATION 12 minutes    EVALUATION COMPLEXITY : CLINICAL DECISION MAKING OF LOW COMPLEXITY AS INDICATED BY  PMH, PHYSICAL THERAPY ASSESSMENT OF MUSCULOSKELETAL AND NEUROLOGICAL SYSTEMS AND ACTIVITY LIMITATIONS. CLINICAL PRESENTATION IS STABLE AND UNCOMPLICATED    Therapist:     Jacolyn Reedy, PT  12/08/2022, 16:37

## 2022-12-09 DIAGNOSIS — R531 Weakness: Secondary | ICD-10-CM

## 2022-12-09 DIAGNOSIS — Z794 Long term (current) use of insulin: Secondary | ICD-10-CM

## 2022-12-09 LAB — CBC WITH DIFF
BASOPHIL #: 0 10*3/uL (ref 0.00–0.10)
BASOPHIL %: 1 % (ref 0–1)
EOSINOPHIL #: 0 10*3/uL (ref 0.00–0.50)
EOSINOPHIL %: 0 % — ABNORMAL LOW
HCT: 34.1 % (ref 31.2–41.9)
HGB: 11.8 g/dL (ref 10.9–14.3)
LYMPHOCYTE #: 2.2 10*3/uL (ref 1.00–3.00)
LYMPHOCYTE %: 36 % (ref 16–44)
MCH: 33.3 pg — ABNORMAL HIGH (ref 24.7–32.8)
MCHC: 34.5 g/dL (ref 32.3–35.6)
MCV: 96.5 fL — ABNORMAL HIGH (ref 75.5–95.3)
MONOCYTE #: 0.5 10*3/uL (ref 0.30–1.00)
MONOCYTE %: 9 % (ref 5–13)
MPV: 10.8 fL (ref 7.9–10.8)
NEUTROPHIL #: 3.4 10*3/uL (ref 1.85–7.80)
NEUTROPHIL %: 55 % (ref 43–77)
PLATELETS: 168 10*3/uL (ref 140–440)
RBC: 3.54 10*6/uL — ABNORMAL LOW (ref 3.63–4.92)
RDW: 13.4 % (ref 12.3–17.7)
WBC: 6.2 10*3/uL (ref 3.8–11.8)

## 2022-12-09 LAB — COMPREHENSIVE METABOLIC PANEL, NON-FASTING
ALBUMIN/GLOBULIN RATIO: 1.1 (ref 0.8–1.4)
ALBUMIN: 3.3 g/dL — ABNORMAL LOW (ref 3.5–5.7)
ALKALINE PHOSPHATASE: 52 U/L (ref 34–104)
ALT (SGPT): 10 U/L (ref 7–52)
ANION GAP: 7 mmol/L (ref 4–13)
AST (SGOT): 12 U/L — ABNORMAL LOW (ref 13–39)
BILIRUBIN TOTAL: 0.2 mg/dL — ABNORMAL LOW (ref 0.3–1.2)
BUN/CREA RATIO: 27 — ABNORMAL HIGH (ref 6–22)
BUN: 26 mg/dL — ABNORMAL HIGH (ref 7–25)
CALCIUM, CORRECTED: 8.9 mg/dL (ref 8.9–10.8)
CALCIUM: 8.3 mg/dL — ABNORMAL LOW (ref 8.6–10.3)
CHLORIDE: 108 mmol/L — ABNORMAL HIGH (ref 98–107)
CO2 TOTAL: 23 mmol/L (ref 21–31)
CREATININE: 0.95 mg/dL (ref 0.60–1.30)
ESTIMATED GFR: 63 mL/min/{1.73_m2} (ref >59)
GLOBULIN: 2.9 (ref 2.9–5.4)
GLUCOSE: 151 mg/dL — ABNORMAL HIGH (ref 74–109)
OSMOLALITY, CALCULATED: 283 mOsm/kg (ref 270–290)
POTASSIUM: 4.2 mmol/L (ref 3.5–5.1)
PROTEIN TOTAL: 6.2 g/dL — ABNORMAL LOW (ref 6.4–8.9)
SODIUM: 138 mmol/L (ref 136–145)

## 2022-12-09 LAB — POC BLOOD GLUCOSE (RESULTS)
GLUCOSE, POC: 121 mg/dl (ref 50–500)
GLUCOSE, POC: 389 mg/dl (ref 50–500)
GLUCOSE, POC: 375 mg/dl (ref 50–500)
GLUCOSE, POC: 384 mg/dl (ref 50–500)

## 2022-12-09 NOTE — PT Treatment (Signed)
[facility]  [address]  [place], [address]  [phone]  (Fax) [phone]  Rehabilitation Department  Physical Therapy Daily Inpatient Note    Date: 12/09/2022  Patient's Name: Grace Hoffman  Date of Birth: [birth year]  Height: Height: 167.6 cm (5\' 6" )  Weight: Weight: 89.4 kg (197 lb)      Plan: Will continue under current POC.         Subjective/Objective/Assessment:  Flowsheet    12/09/22 1050   Rehab Session   Document Type therapy progress note (daily note)   General Information   Patient Profile Reviewed yes   Medical Lines PIV Line   Respiratory Status room air   Existing Precautions/Restrictions droplet isolation;fall precautions;full code   Pre Treatment Status   Pre Treatment Patient Status Nurse approved session;Patient sitting on edge of bed   Pre- Treatment Vital Signs   Pre-Treatment Heart Rate (beats/min) 58   Pre SpO2 (%) 96   O2 Delivery Pre Treatment room air   Pre-Treatment Pain   Pre/Posttreatment Pain Comment no reports of pain   Transfer Assessment/Treatment   Sit-Stand Independence contact guard assist   Stand-Sit Independence contact guard assist   Toilet Transfer Independence contact guard assist   Toilet Transfer Assist Device walker, front wheeled   Gait Assessment/Treatment   Total Distance Ambulated 40   Independence  contact guard assist   Assistive Device  walker, front wheeled   Distance in Feet within confines of room, to/from bathroom and then to/from door   Gait Speed slow   Safety Issues  step length decreased  (states LE's will sometimes begin shaking)   Impairments  endurance   Therapeutic Exercise   Comment seated LE therex LAQ's, marching, ankle pumps   Post Treatment Status   Post Treatment Patient Status Patient sitting in bedside chair or w/c;Call light within reach;Telephone within reach;Patient safety alarm activated   12/11/22 Nurse   Communication Post Treatment Comment IV complete   Patient Effort  good   Post-Treatment Vital Signs   Post-treatment Heart Rate (beats/min) 61   Post SpO2 (%) 96   O2 Delivery Post Treatment room air   Cognitive Assessment/Intervention   Behavior/Mood Observations behavior appropriate to situation, WNL/WFL   Physical Therapy Time and Intention   Total PT Minutes: 23     Pt tolerated mobility activities well with no c/o. CGA for ambulation.            Intervention minutes: THERAPEUTIC EXERCISE 8 minutes and GAIT TRAINING 15 MINUTES    THERAPIST  Film/video editor, PTA  12/09/2022, 12:40

## 2022-12-09 NOTE — Care Management Notes (Signed)
[facility]  Care Management Initial Evaluation    Patient Name: Grace Hoffman  Date of Birth: 26-Oct-1951  Sex: female  Date/Time of Admission: 12/05/2022  9:35 PM  Room/Bed: 325/A  Payor: MEDICARE / Plan: MEDICARE PART A AND B / Product Type: Medicare /   Primary Care Providers:  Rosalio Loud, PA-C (General)    Pharmacy Info:   Preferred Pharmacy       [facility] [phone] [place], [place] - [address] AT [facility] [address]    [address] [place] [address]    Phone: [phone] Fax: [phone]    Hours: Not open 24 hours    [facility] [address] [place] - [address]    [address] [place] [address]    Phone: [phone] Fax: [phone]    Hours: Not open 24 hours          Emergency Contact Info:   Extended Emergency Contact Information  Primary Emergency Contact: Grace Hoffman  Mobile Phone: [phone]  Relation: Husband  Preferred language: English  Interpreter needed? No    History:   Grace Hoffman is a 71 y.o., female, admitted     Height/Weight: 167.6 cm (5\' 6" ) / 89.4 kg (197 lb)     LOS: 3 days   Admitting Diagnosis: COVID-19 [U07.1]    Assessment:       Discharge Plan:  Home (Patient/Family Member/other) (code 1)  Cm spoke with the pt who states that she does not want rehab and has a procedure the day after Christmas. Pt states that she wants [facility] for PT and Nursing and uses Grace Hoffman.    The patient will continue to be evaluated for developing discharge needs.     Case Manager: 07-14-1972, SOCIAL WORKER  Phone: [phone]

## 2022-12-09 NOTE — Progress Notes (Signed)
[facility]               IP PROGRESS NOTE      Grace Hoffman, Grace Hoffman  Date of Admission:  12/05/2022  Date of Birth:  May 30, 1951  Date of Service:  12/09/2022    Hospital Day:  LOS: 3 days       Subjective:   Patient sitting up in vascular chair time around.  She is on room air 94% oxygen saturation.  Patient is feeling slightly better and diarrhea starting to form.  However patient states she feels weak and is not ready to be discharged yet.  Patient does not want to go to rehab.  She wants home health with physical therapy but she does not feel she can go today.    Vital Signs:  Temp (24hrs) Max:36.9 C (98.4 F)      Temperature: 36.8 C (98.2 F)  BP (Non-Invasive): 129/74  MAP (Non-Invasive): 90 mmHG  Heart Rate: 58  Respiratory Rate: 18  SpO2: 95 %    Current Medications:  acetaminophen (TYLENOL) tablet, 650 mg, Oral, Q4H PRN  albuterol 90 mcg per inhalation oral inhaler - "Respiratory to administer", 2 Puff, Inhalation, Q6H PRN  atorvastatin (LIPITOR) tablet, 20 mg, Oral, NIGHTLY  azithromycin (ZITHROMAX) 500 mg in NS 250 mL IVPB, 500 mg, Intravenous, Q24H  cefTRIAXone (ROCEPHIN) 1 g in NS 50 mL IVPB minibag, 1 g, Intravenous, Q24H  dexAMETHasone (PF) 10 mg/mL injection, 6 mg, Intravenous, Daily  dextrose 50% (0.5 g/mL) injection - syringe, 25 g, Intravenous, Q15 Min PRN   Or  dextrose 50% (0.5 g/mL) injection - syringe, 12.5 g, Intravenous, Q15 Min PRN   Or  glucagon (GLUCAGEN DIAGNOSTIC KIT) injection 1 mg, 1 mg, Subcutaneous, Q15 Min PRN   Or  glucagon (GLUCAGEN DIAGNOSTIC KIT) injection 1 mg, 1 mg, IntraMUSCULAR, Q15 Min PRN  diazePAM (VALIUM) tablet, 5 mg, Oral, Daily  heparin 5,000 unit/mL injection, 5,000 Units, Subcutaneous, Q8HRS  HYDROcodone-acetaminophen (NORCO) 7.5 mg-325 mg per tablet, 1 Tablet, Oral, Q6H PRN  lactobacillus rhamnosus (CULTURELLE) active cultures capsule, 1 Capsule, Oral, 2x/day-Food  levETIRAcetam (KEPPRA) tablet, 250 mg, Oral, 2x/day  loperamide (IMODIUM) capsule, 2  mg, Oral, Q4H PRN  magnesium oxide (MAG-OX) 47m (2423melemental magnesium) tablet, 800 mg, Oral, Daily  meclizine (ANTIVERT) tablet, 25 mg, Oral, Q6HRS  NS flush syringe, 3 mL, Intracatheter, Q8HRS  NS flush syringe, 3 mL, Intracatheter, Q1H PRN  NS premix infusion, , Intravenous, Continuous  oxyBUTYnin (DITROPAN) tablet, 5 mg, Oral, 3x/day  perflutren lipid microspheres (DEFINITY) 1.3 mL in NS 10 mL (tot vol) injection, 2 mL, Intravenous, Cardiology Once PRN  remdesivir 100 mg in NS 250 mL (tot vol) IVPB, 100 mg, Intravenous, Q24H  SSIP insulin R human (HumuLIN R) 100 units/mL injection, 0-12 Units, Subcutaneous, 4x/day AC  venlafaxine (EFFEXOR XR) 24 hr extended release capsule, 37.5 mg, Oral, NIGHTLY    Current Orders:  Active Orders   Lab    CBC     Frequency: ONE TIME     Number of Occurrences: 1 Occurrences    CBC     Frequency: ONE TIME     Number of Occurrences: 1 Occurrences    CBC     Frequency: ONE TIME     Number of Occurrences: 1 Occurrences    CBC WITH DIFF     Frequency: Once     Number of Occurrences: 1 Occurrences    CBC/DIFF     Frequency: 0530 - AM DRAW  Number of Occurrences: 1 Occurrences    COMPREHENSIVE METABOLIC PANEL, NON-FASTING     Frequency: 0530 - AM DRAW     Number of Occurrences: 1 Occurrences    COMPREHENSIVE METABOLIC PANEL, NON-FASTING     Frequency: ONE TIME     Number of Occurrences: 1 Occurrences    COMPREHENSIVE METABOLIC PANEL, NON-FASTING     Frequency: ONE TIME     Number of Occurrences: 1 Occurrences    COMPREHENSIVE METABOLIC PANEL, NON-FASTING     Frequency: ONE TIME     Number of Occurrences: 1 Occurrences    MAGNESIUM     Frequency: ONE TIME     Number of Occurrences: 1 Occurrences    MAGNESIUM     Frequency: ONE TIME     Number of Occurrences: 1 Occurrences    MAGNESIUM     Frequency: ONE TIME     Number of Occurrences: 1 Occurrences   Diet    DIET CARDIAC (2G NA, LOWFAT, LOW CHOL) Do you want to initiate MNT Protocol? Yes     Frequency: All Meals     Number of  Occurrences: 1 Occurrences   Nursing    TELEMETRY MONITORING - Continuous     Frequency: CONTINUOUS     Number of Occurrences: Until Specified   Code Status    FULL CODE     Frequency: CONTINUOUS     Number of Occurrences: Until Specified   Consult    IP CONSULT TO CARE MANAGEMENT     Frequency: ONE TIME     Number of Occurrences: 1 Occurrences   Isolation    ENHANCED DROPLET ISOLATION     Frequency: CONTINUOUS     Number of Occurrences: Until Specified     Order Comments: Private room  Wear a fitted N95/MSA/CAPR when entering room  Wear gown, gloves, and eye protection as indicated  Prior to transport, notify receiving department of precautions  Prior to transport, ensure patient is wearing a mask and follows Respiratory Hygiene/Cough Etiquette         PT    PT EVALUATE AND TREAT     Frequency: Per Therapist Discretion     Number of Occurrences: 1 Occurrences     Order Comments: If patient's O2 level drops, PT may increase O2 until sats are > 93%.       Scheduling Instructions:      Isolation Status:  Enhanced Droplet & Contact         Respiratory Care    OXYGEN - NASAL CANNULA     Frequency: CONTINUOUS     Number of Occurrences: Until Specified     Order Comments: Flowrate should not exeed 6L/min except [facility] facility.          Point of Care Testing    PERFORM POC WHOLE BLOOD GLUCOSE     Frequency: TID AC & HS     Number of Occurrences: Until Specified    PERFORM POC WHOLE BLOOD GLUCOSE     Frequency: TID AC & HS     Number of Occurrences: Until Specified   IV    INSERT & MAINTAIN PERIPHERAL IV ACCESS     Frequency: UNTIL DISCONTINUED     Number of Occurrences: Until Specified    PERIPHERAL IV DRESSING CHANGE     Frequency: PRN     Number of Occurrences: Until Specified   Medications    acetaminophen (TYLENOL) tablet     Frequency: Q4H PRN  Dose: 650 mg     Route: Oral    albuterol 90 mcg per inhalation oral inhaler - "Respiratory to administer"     Frequency: Q6H PRN     Dose: 2 Puff     Route: Inhalation     atorvastatin (LIPITOR) tablet     Frequency: NIGHTLY     Dose: 20 mg     Route: Oral    azithromycin (ZITHROMAX) 500 mg in NS 250 mL IVPB     Frequency: Q24H     Dose: 500 mg     Route: Intravenous    cefTRIAXone (ROCEPHIN) 1 g in NS 50 mL IVPB minibag     Frequency: Q24H     Dose: 1 g     Route: Intravenous    dexAMETHasone (PF) 10 mg/mL injection     Frequency: Daily     Dose: 6 mg     Route: Intravenous    dextrose 50% (0.5 g/mL) injection - syringe     Linked Order: Or     Frequency: Q15 Min PRN     Dose: 25 g     Route: Intravenous    dextrose 50% (0.5 g/mL) injection - syringe     Linked Order: Or     Frequency: Q15 Min PRN     Dose: 12.5 g     Route: Intravenous    diazePAM (VALIUM) tablet     Frequency: Daily     Dose: 5 mg     Route: Oral    glucagon (GLUCAGEN DIAGNOSTIC KIT) injection 1 mg     Linked Order: Or     Frequency: Q15 Min PRN     Dose: 1 mg     Route: Subcutaneous    glucagon (GLUCAGEN DIAGNOSTIC KIT) injection 1 mg     Linked Order: Or     Frequency: Q15 Min PRN     Dose: 1 mg     Route: IntraMUSCULAR    heparin 5,000 unit/mL injection     Frequency: Q8HRS     Dose: 5,000 Units     Route: Subcutaneous    HYDROcodone-acetaminophen (NORCO) 7.5 mg-325 mg per tablet     Frequency: Q6H PRN     Dose: 1 Tablet     Route: Oral    lactobacillus rhamnosus (CULTURELLE) active cultures capsule     Frequency: 2x/day-Food     Dose: 1 Capsule     Route: Oral    levETIRAcetam (KEPPRA) tablet     Frequency: 2x/day     Dose: 250 mg     Route: Oral    loperamide (IMODIUM) capsule     Frequency: Q4H PRN     Dose: 2 mg     Route: Oral    magnesium oxide (MAG-OX) 46m (2420melemental magnesium) tablet     Frequency: Daily     Dose: 800 mg     Route: Oral    meclizine (ANTIVERT) tablet     Frequency: Q6HRS     Dose: 25 mg     Route: Oral    NS flush syringe     Frequency: Q8HRS     Dose: 3 mL     Route: Intracatheter    NS flush syringe     Frequency: Q1H PRN     Dose: 3 mL     Route: Intracatheter    NS premix  infusion     Frequency: Continuous     Route: Intravenous    oxyBUTYnin (  DITROPAN) tablet     Frequency: 3x/day     Dose: 5 mg     Route: Oral    perflutren lipid microspheres (DEFINITY) 1.3 mL in NS 10 mL (tot vol) injection     Frequency: Cardiology Once PRN     Dose: 2 mL     Route: Intravenous    remdesivir 100 mg in NS 250 mL (tot vol) IVPB     Frequency: Q24H     Dose: 100 mg     Route: Intravenous    SSIP insulin R human (HumuLIN R) 100 units/mL injection     Frequency: 4x/day AC     Dose: 0-12 Units     Route: Subcutaneous    venlafaxine (EFFEXOR XR) 24 hr extended release capsule     Frequency: NIGHTLY     Dose: 37.5 mg     Route: Oral      Review of Systems:  Focused review of system was completed. Refer to the HPI for ROS details.     Today's Physical Exam:  Physical Exam  Constitutional:       General: She is not in acute distress.  Cardiovascular:      Rate and Rhythm: Normal rate and regular rhythm.   Pulmonary:      Effort: No respiratory distress.      Breath sounds: Normal breath sounds. No wheezing.   Abdominal:      General: Bowel sounds are normal. There is no distension.      Palpations: Abdomen is soft.      Tenderness: There is no abdominal tenderness.   Musculoskeletal:         General: No swelling.   Skin:     General: Skin is warm and dry.   Neurological:      General: No focal deficit present.      Mental Status: She is alert.     I/O:  I/O last 24 hours:    Intake/Output Summary (Last 24 hours) at 12/09/2022 1319  Last data filed at 12/08/2022 1802  Gross per 24 hour   Intake 450 ml   Output --   Net 450 ml     I/O current shift:  No intake/output data recorded.    Labs:  Reviewed: I have reviewed all lab results.  Lab Results Today:    Results for orders placed or performed during the hospital encounter of 12/05/22 (from the past 24 hour(s))   POC BLOOD GLUCOSE (RESULTS)   Result Value Ref Range    GLUCOSE, POC 409 50 - 500 mg/dl   POC BLOOD GLUCOSE (RESULTS)   Result Value Ref Range     GLUCOSE, POC 360 50 - 500 mg/dl   POC BLOOD GLUCOSE (RESULTS)   Result Value Ref Range    GLUCOSE, POC 304 50 - 500 mg/dl   COMPREHENSIVE METABOLIC PANEL, NON-FASTING   Result Value Ref Range    SODIUM 138 136 - 145 mmol/L    POTASSIUM 4.2 3.5 - 5.1 mmol/L    CHLORIDE 108 (H) 98 - 107 mmol/L    CO2 TOTAL 23 21 - 31 mmol/L    ANION GAP 7 4 - 13 mmol/L    BUN 26 (H) 7 - 25 mg/dL    CREATININE 0.95 0.60 - 1.30 mg/dL    BUN/CREA RATIO 27 (H) 6 - 22    ESTIMATED GFR 63 >59 mL/min/1.42m2    ALBUMIN 3.3 (L) 3.5 - 5.7 g/dL    CALCIUM 8.3 (L)  8.6 - 10.3 mg/dL    GLUCOSE 151 (H) 74 - 109 mg/dL    ALKALINE PHOSPHATASE 52 34 - 104 U/L    ALT (SGPT) 10 7 - 52 U/L    AST (SGOT) 12 (L) 13 - 39 U/L    BILIRUBIN TOTAL 0.2 (L) 0.3 - 1.2 mg/dL    PROTEIN TOTAL 6.2 (L) 6.4 - 8.9 g/dL    ALBUMIN/GLOBULIN RATIO 1.1 0.8 - 1.4    OSMOLALITY, CALCULATED 283 270 - 290 mOsm/kg    CALCIUM, CORRECTED 8.9 8.9 - 10.8 mg/dL    GLOBULIN 2.9 2.9 - 5.4   CBC WITH DIFF   Result Value Ref Range    WBC 6.2 3.8 - 11.8 x10^3/uL    RBC 3.54 (L) 3.63 - 4.92 x10^6/uL    HGB 11.8 10.9 - 14.3 g/dL    HCT 34.1 31.2 - 41.9 %    MCV 96.5 (H) 75.5 - 95.3 fL    MCH 33.3 (H) 24.7 - 32.8 pg    MCHC 34.5 32.3 - 35.6 g/dL    RDW 13.4 12.3 - 17.7 %    PLATELETS 168 140 - 440 x10^3/uL    MPV 10.8 7.9 - 10.8 fL    NEUTROPHIL % 55 43 - 77 %    LYMPHOCYTE % 36 16 - 44 %    MONOCYTE % 9 5 - 13 %    EOSINOPHIL % 0 (L) %    BASOPHIL % 1 0 - 1 %    NEUTROPHIL # 3.40 1.85 - 7.80 x10^3/uL    LYMPHOCYTE # 2.20 1.00 - 3.00 x10^3/uL    MONOCYTE # 0.50 0.30 - 1.00 x10^3/uL    EOSINOPHIL # 0.00 0.00 - 0.50 x10^3/uL    BASOPHIL # 0.00 0.00 - 0.10 x10^3/uL   POC BLOOD GLUCOSE (RESULTS)   Result Value Ref Range    GLUCOSE, POC 121 50 - 500 mg/dl   POC BLOOD GLUCOSE (RESULTS)   Result Value Ref Range    GLUCOSE, POC 389 50 - 500 mg/dl     Micro Results: No results found for any visits on 12/05/22 (from the past 24 hour(s)).  Images:   CT BRAIN WO IV CONTRAST    Result Date:  12/07/2022  Impression CHRONIC CHANGES.  NO ACUTE FINDINGS. One or more dose reduction techniques were used (e.g., Automated exposure control, adjustment of the mA and/or kV according to patient size, use of iterative reconstruction technique). Radiologist location ID: [id number]     MRI BRAIN WO CONTRAST   Final Result   NO ACUTE STROKE.      SCATTERED NONSPECIFIC WHITE MATTER DISEASE.            Radiologist location ID: [id number]         CT BRAIN WO IV CONTRAST   Final Result   CHRONIC CHANGES.  NO ACUTE FINDINGS.          One or more dose reduction techniques were used (e.g., Automated exposure control, adjustment of the mA and/or kV according to patient size, use of iterative reconstruction technique).         Radiologist location ID: [id number]         XR KUB AND UPRIGHT ABDOMEN   Final Result   NO ACUTE FINDINGS            Radiologist location ID: [id number]         XR CHEST AP   Final Result   NO ACUTE FINDINGS.  Radiologist location ID: [id number]         CT ABDOMEN PELVIS WO IV CONTRAST   Final Result   NORMAL NONCONTRAST CT OF THE ABDOMEN AND PELVIS.          One or more dose reduction techniques were used (e.g., Automated exposure control, adjustment of the mA and/or kV according to patient size, use of iterative reconstruction technique).         Radiologist location ID: [id number]            Problem List:  Active Hospital Problems   (*Primary Problem)    Diagnosis    *COVID-19    Syncope    Vertigo    Diabetes mellitus, type 2 (CMS HCC)     Assessment/ Plan:   Acute respiratory failure with hypoxia:  -felt likely secondary to COVID-19.  -resolved at this time.  Patient is on room air.  -continue Decadron, remdesivir, Rocephin, and azithromycin.    Covid-19:  -treatment as above.    Diarrhea:  -improving, felt to be due to COVID-19.    -KUB unremarkable  .-p.r.n. Imodium.    Vertigo/presyncope:  -continue Antivert.  -head CT and brain MRI are negative for acute  findings.  -echocardiogram showed an EF of 70%, no segmental/regional wall motion abnormalities, left ventricular diastolic parameters are normal.  See report.  -patient was able to ambulate with physical therapy 50 ft yesterday and 40 ft today.  Continue physical therapy.    Other:  -case management consulted for discharge planning as patient will want home health and physical therapy.  -further treatment adjustments were made based on clinical course.  See orders for further details.  -Hospitalist personally evaluated and examined the patient in conjunction with the MLP and agree with the assessments, treatment plan and disposition of the patient as recorded by the [facility].     DVT/PE Prophylaxis: Heparin    Joselyn Glassman, FNP-BC    Patient was seen and examined as part of a shared visit with the APP.  I personally spent more than 50% of the encounter with direct, face-to-face patient care including: obtaining history, performing physical exam, counseling the patient and family, and directing medical decision making.      Luci Bank, DO

## 2022-12-10 ENCOUNTER — Telehealth (INDEPENDENT_AMBULATORY_CARE_PROVIDER_SITE_OTHER): Payer: Self-pay | Admitting: Physician Assistant

## 2022-12-10 DIAGNOSIS — Z7984 Long term (current) use of oral hypoglycemic drugs: Secondary | ICD-10-CM

## 2022-12-10 LAB — COMPREHENSIVE METABOLIC PANEL, NON-FASTING
ALBUMIN/GLOBULIN RATIO: 1.1 (ref 0.8–1.4)
ALBUMIN: 3.2 g/dL — ABNORMAL LOW (ref 3.5–5.7)
ALKALINE PHOSPHATASE: 53 U/L (ref 34–104)
ALT (SGPT): 11 U/L (ref 7–52)
ANION GAP: 5 mmol/L (ref 4–13)
AST (SGOT): 11 U/L — ABNORMAL LOW (ref 13–39)
BILIRUBIN TOTAL: 0.3 mg/dL (ref 0.3–1.2)
BUN/CREA RATIO: 23 — ABNORMAL HIGH (ref 6–22)
BUN: 24 mg/dL (ref 7–25)
CALCIUM, CORRECTED: 9.1 mg/dL (ref 8.9–10.8)
CALCIUM: 8.5 mg/dL — ABNORMAL LOW (ref 8.6–10.3)
CHLORIDE: 107 mmol/L (ref 98–107)
CO2 TOTAL: 25 mmol/L (ref 21–31)
CREATININE: 1.06 mg/dL (ref 0.60–1.30)
ESTIMATED GFR: 55 mL/min/{1.73_m2} — ABNORMAL LOW (ref >59)
GLOBULIN: 2.9 (ref 2.9–5.4)
GLUCOSE: 241 mg/dL — ABNORMAL HIGH (ref 74–109)
OSMOLALITY, CALCULATED: 286 mOsm/kg (ref 270–290)
POTASSIUM: 4.5 mmol/L (ref 3.5–5.1)
PROTEIN TOTAL: 6.1 g/dL — ABNORMAL LOW (ref 6.4–8.9)
SODIUM: 137 mmol/L (ref 136–145)

## 2022-12-10 LAB — CBC
HCT: 34.9 % (ref 31.2–41.9)
HGB: 11.7 g/dL (ref 10.9–14.3)
MCH: 32.2 pg (ref 24.7–32.8)
MCHC: 33.4 g/dL (ref 32.3–35.6)
MCV: 96.5 fL — ABNORMAL HIGH (ref 75.5–95.3)
MPV: 10.9 fL — ABNORMAL HIGH (ref 7.9–10.8)
PLATELETS: 161 10*3/uL (ref 140–440)
RBC: 3.62 10*6/uL — ABNORMAL LOW (ref 3.63–4.92)
RDW: 13.4 % (ref 12.3–17.7)
WBC: 7.1 10*3/uL (ref 3.8–11.8)

## 2022-12-10 LAB — ADULT ROUTINE BLOOD CULTURE, SET OF 2 BOTTLES (BACTERIA AND YEAST)
BLOOD CULTURE, ROUTINE: NO GROWTH
BLOOD CULTURE, ROUTINE: NO GROWTH

## 2022-12-10 LAB — POC BLOOD GLUCOSE (RESULTS): GLUCOSE, POC: 152 mg/dl (ref 50–500)

## 2022-12-10 LAB — MAGNESIUM: MAGNESIUM: 1.7 mg/dL — ABNORMAL LOW (ref 1.9–2.7)

## 2022-12-10 MED ORDER — MAGNESIUM SULFATE 1 GRAM/100 ML IN DEXTROSE 5 % INTRAVENOUS PIGGYBACK
1.0000 g | INJECTION | Freq: Once | INTRAVENOUS | Status: DC
Start: 2022-12-10 — End: 2022-12-10
  Administered 2022-12-10 (×2): 0 g via INTRAVENOUS
  Filled 2022-12-10: qty 100

## 2022-12-10 MED ORDER — MAGNESIUM OXIDE 400 MG (241.3 MG MAGNESIUM) TABLET
400.0000 mg | ORAL_TABLET | ORAL | Status: AC
Start: 2022-12-10 — End: 2022-12-10
  Administered 2022-12-10: 400 mg via ORAL
  Filled 2022-12-10: qty 1

## 2022-12-10 MED ORDER — LACTOBACILLUS RHAMNOSUS GG 10 BILLION CELL CAPSULE
1.0000 | ORAL_CAPSULE | Freq: Two times a day (BID) | ORAL | 0 refills | Status: AC
Start: 2022-12-10 — End: 2023-01-09

## 2022-12-10 MED ORDER — FLUTICASONE PROPIONATE 50 MCG/ACTUATION NASAL SPRAY,SUSPENSION
1.0000 | Freq: Every day | NASAL | 0 refills | Status: AC
Start: 2022-12-10 — End: 2023-01-09

## 2022-12-10 MED ORDER — MECLIZINE 25 MG TABLET
25.0000 mg | ORAL_TABLET | Freq: Four times a day (QID) | ORAL | 0 refills | Status: AC
Start: 2022-12-10 — End: 2022-12-24

## 2022-12-10 NOTE — Nurses Notes (Signed)
Discharge reviewed with patient. IV and tele removed. Patient denies any questions at time of DC, pt wheeled down to car to leave with husband.

## 2022-12-10 NOTE — Discharge Summary (Signed)
[facility]  DISCHARGE SUMMARY      PATIENT NAME:  Grace Hoffman, Grace Hoffman  MRN:  [id number]  DOB:  07-Oct-1951    ADMISSION DATE:  12/05/2022  DISCHARGE DATE:  12/10/2022    ATTENDING PHYSICIAN: Luci Bank, DO   PRIMARY CARE PHYSICIAN: Cleophus Molt, [facility] Problems:   Active Hospital Problems    Diagnosis Date Noted    Principal Problem: COVID-19 [U07.1] 12/06/2022    Vertigo [R42] 05/20/2022    Diabetes mellitus, type 2 (CMS HCC) [E11.9] 03/12/2022      Resolved Hospital Problems    Diagnosis     Syncope [R55]      Active Non-Hospital Problems    Diagnosis Date Noted    Mood disorder (CMS [place]) 12/02/2022    Chronic back pain 11/03/2022    Bulging of lumbar intervertebral disc 11/03/2022    Essential hypertension 10/07/2022    Chronic kidney disease (CKD), stage II (mild) 10/07/2022    DJD (degenerative joint disease), lumbar 10/06/2022    Renal cyst, acquired, right 08/13/2022    Urinary incontinence 07/16/2022    Fatigue 07/16/2022    Recurrent UTI 06/16/2022    TIA (transient ischemic attack) 06/12/2022    B12 deficiency 05/20/2022    Uncontrolled type 2 diabetes mellitus with hyperglycemia (CMS HCC) 04/03/2022    Hypomagnesemia 04/03/2022    Myalgia 04/03/2022    Long-term use of high-risk medication 04/03/2022    Chronic pain 04/03/2022    Orthostatic hypotension 03/12/2022    Hypertension 03/12/2022    Osteoarthritis 03/12/2022    Anxiety 03/12/2022    Depression 03/12/2022        Allergies:  Allergies   Allergen Reactions    Codeine Nausea/ Vomiting    Amoxicillin Diarrhea    Macrobid [Nitrofurantoin Monohyd/M-Cryst] Nausea/ Vomiting        DISCHARGE MEDICATIONS:     Current Discharge Medication List        START taking these medications.        Details   lactobacillus rhamnosus (GG) 10 B CELL/cap Capsule  Commonly known as: CULTURELLE   1 Capsule, Oral, 2 TIMES DAILY WITH FOOD  Qty: 60 Capsule  Refills: 0     meclizine 25 mg Tablet  Commonly known as: ANTIVERT   25 mg, Oral, EVERY 6 HOURS  (SCHEDULED)  Qty: 56 Tablet  Refills: 0            CONTINUE these medications which have CHANGED during your visit.        Details   famotidine 20 mg Tablet  Commonly known as: PEPCID  What changed: when to take this   TAKE 1 TABLET BY MOUTH TWICE DAILY  Qty: 60 Tablet  Refills: 3            CONTINUE these medications - NO CHANGES were made during your visit.        Details   d-mannose 500 mg Capsule   1 Capsule, Oral, 2 TIMES DAILY  Refills: 0     diazePAM 5 mg Tablet  Commonly known as: VALIUM   5 mg, Oral, DAILY  Qty: 30 Tablet  Refills: 0     enalapril maleate 10 mg Tablet  Commonly known as: VASOTEC   10 mg, Oral, DAILY  Qty: 90 Tablet  Refills: 1     ergocalciferol (vitamin D2) 1,250 mcg (50,000 unit) Capsule  Commonly known as: DRISDOL   50,000 Units, Oral, EVERY 7 DAYS  Qty: 4 Capsule  Refills: 5     fluticasone propionate 50 mcg/actuation Spray, Suspension  Commonly known as: FLONASE   1 Spray, Each Nostril, DAILY  Qty: 16 g  Refills: 0     glimepiride 4 mg Tablet  Commonly known as: AMARYL   TAKE 1 TABLET(4 MG) BY MOUTH TWICE DAILY WITH A MEAL FOR DIABETES  Qty: 180 Tablet  Refills: 1     hydroCHLOROthiazide 25 mg Tablet  Commonly known as: HYDRODIURIL   25 mg, Oral, DAILY PRN  Qty: 30 Tablet  Refills: 3     HYDROcodone-acetaminophen 7.5-325 mg Tablet  Commonly known as: NORCO   1 Tablet, Oral, EVERY 6 HOURS PRN  Qty: 90 Tablet  Refills: 0     latanoprost 0.005 % Drops  Commonly known as: XALATAN   1 Drop, Left Eye, NIGHTLY  Refills: 0     Levetiracetam 500 mg Tablet Sustained Release 24 hr   500 mg, Oral, NIGHTLY  Qty: 90 Tablet  Refills: 1     loratadine 10 mg Tablet  Commonly known as: CLARITIN   10 mg, Oral, DAILY PRN  Qty: 90 Tablet  Refills: 3     magnesium oxide 400 mg Tablet  Commonly known as: MAG-OX   2 Tablets, Oral, DAILY  Refills: 0     MetFORMIN 1,000 mg Tablet  Commonly known as: GLUCOPHAGE   1,000 mg, Oral, 2 TIMES DAILY WITH FOOD  Qty: 180 Tablet  Refills: 1     rosuvastatin 10 mg  Tablet  Commonly known as: CRESTOR   TAKE 1 TABLET BY MOUTH EVERY NIGHT AT BEDTIME  Qty: 90 Tablet  Refills: 1     solifenacin 5 mg Tablet  Commonly known as: VESICARE   TAKE 1 TABLET(5 MG) BY MOUTH EVERY DAY  Qty: 90 Tablet  Refills: 1     SUMAtriptan 50 mg Tablet  Commonly known as: IMITREX   50 mg, Oral, ONCE PRN, TAKE 1 TABLET BY MOUTH AT ONSET OF HEADACHE. IF NO RELIEF MAY REPEAT 1 AFTER AT LEAST 2 HOURS. MAX OF 4 TABS PER 24 HOURS  Qty: 18 Tablet  Refills: 3     Toujeo Max U-300 SoloStar 300 unit/mL (3 mL) Insulin Pen  Generic drug: insulin glargine U-300 conc   ADMINISTER 10 UNITS UNDER THE SKIN EVERY NIGHT  Qty: 3 mL  Refills: 2     venlafaxine 37.5 mg Capsule, Sust. Release 24 hr  Commonly known as: EFFEXOR XR   37.5 mg, Oral, NIGHTLY  Refills: 0              DISCHARGE INSTRUCTIONS:      DISCHARGE INSTRUCTION - DIET - RESUME HOME DIET     Diet: RESUME HOME DIET      DISCHARGE INSTRUCTION - ACTIVITY - MAY RESUME PREVIOUS ACTIVITY     Activity: MAY RESUME PREVIOUS ACTIVITY      DISCHARGE INSTRUCTION - FOLLOW SAFETY PLAN    Follow safety plan.     DISCHARGE INSTRUCTION - KEEP FOLLOW-UP APPOINTMENTS AS SCHEDULED    Keep follow-up appointments as scheduled.     DISCHARGE INSTRUCTION - TAKE  MEDICATIONS AS ORDERED      REASON FOR HOSPITALIZATION AND HOSPITAL COURSE: This serves as a brief accounting of the course of hospitalization. For full accounting of labs and day to day occurrences, please see daily progress notes. Grace Hoffman is a 71 y.o., female was admitted with acute respiratory failure due to COVID-19, diabetes, and hyperlipidemia.  Patient placed on  supplemental oxygen therapy to maintain O2 saturation 90-92%.  She was also placed on DuoNebs, remdesivir, Decadron, and antibiotic therapy.  Patient's oxygen was titrated and she is on room air.  Patient did have increased diarrhea felt likely secondary to COVID.  KUB was negative.  Imodium p.r.n. was started.  She also had an episode of dizziness.   Head CT and brain MRI were negative for acute findings.  Carotid artery ultrasound was within normal limits.  Antivert was started for possible vertigo.  Physical therapy was consulted for evaluation.  Patient was able to go 40 ft with them yesterday.  Patient is improved.  Patient offered rehab but she would like to go home with home health and physical therapy.  Patient to be discharged home with family home health and physical therapy.  She is to follow up with her PCP in 1 week.  Follow up with her back surgeon as scheduled for injections on 12/26. The hospitalist examined the patient, reviewed material, and agreed with discharge at this time. This discharge took 40 minutes.     DISCHARGE ASSESSMENT:   BP (!) 152/90   Pulse 51   Temp 36.4 C (97.6 F)   Resp 20   Ht 1.676 m (5\' 6" )   Wt 89.4 kg (197 lb)   SpO2 95%   BMI 31.80 kg/m       Physical Exam  Constitutional:       General: She is not in acute distress.  Cardiovascular:      Rate and Rhythm: Normal rate and regular rhythm.   Pulmonary:      Effort: No respiratory distress.      Breath sounds: Normal breath sounds. No wheezing.   Abdominal:      General: Bowel sounds are normal. There is no distension.      Palpations: Abdomen is soft.   Musculoskeletal:         General: No swelling.   Skin:     General: Skin is warm and dry.   Neurological:      General: No focal deficit present.      Mental Status: She is alert.     DISCHARGE LABS/IMAGES:     Labs within last 24 hours.   Results for orders placed or performed during the hospital encounter of 12/05/22 (from the past 24 hour(s))   POC BLOOD GLUCOSE (RESULTS)   Result Value Ref Range    GLUCOSE, POC 389 50 - 500 mg/dl   POC BLOOD GLUCOSE (RESULTS)   Result Value Ref Range    GLUCOSE, POC 375 50 - 500 mg/dl   POC BLOOD GLUCOSE (RESULTS)   Result Value Ref Range    GLUCOSE, POC 384 50 - 500 mg/dl   CBC   Result Value Ref Range    WBC 7.1 3.8 - 11.8 x10^3/uL    RBC 3.62 (L) 3.63 - 4.92 x10^6/uL    HGB  11.7 10.9 - 14.3 g/dL    HCT 34.9 31.2 - 41.9 %    MCV 96.5 (H) 75.5 - 95.3 fL    MCH 32.2 24.7 - 32.8 pg    MCHC 33.4 32.3 - 35.6 g/dL    RDW 13.4 12.3 - 17.7 %    PLATELETS 161 140 - 440 x10^3/uL    MPV 10.9 (H) 7.9 - 10.8 fL   COMPREHENSIVE METABOLIC PANEL, NON-FASTING   Result Value Ref Range    SODIUM 137 136 - 145 mmol/L    POTASSIUM 4.5 3.5 -  5.1 mmol/L    CHLORIDE 107 98 - 107 mmol/L    CO2 TOTAL 25 21 - 31 mmol/L    ANION GAP 5 4 - 13 mmol/L    BUN 24 7 - 25 mg/dL    CREATININE 1.06 0.60 - 1.30 mg/dL    BUN/CREA RATIO 23 (H) 6 - 22    ESTIMATED GFR 55 (L) >59 mL/min/1.7m^2    ALBUMIN 3.2 (L) 3.5 - 5.7 g/dL    CALCIUM 8.5 (L) 8.6 - 10.3 mg/dL    GLUCOSE 241 (H) 74 - 109 mg/dL    ALKALINE PHOSPHATASE 53 34 - 104 U/L    ALT (SGPT) 11 7 - 52 U/L    AST (SGOT) 11 (L) 13 - 39 U/L    BILIRUBIN TOTAL 0.3 0.3 - 1.2 mg/dL    PROTEIN TOTAL 6.1 (L) 6.4 - 8.9 g/dL    ALBUMIN/GLOBULIN RATIO 1.1 0.8 - 1.4    OSMOLALITY, CALCULATED 286 270 - 290 mOsm/kg    CALCIUM, CORRECTED 9.1 8.9 - 10.8 mg/dL    GLOBULIN 2.9 2.9 - 5.4   MAGNESIUM   Result Value Ref Range    MAGNESIUM 1.7 (L) 1.9 - 2.7 mg/dL   POC BLOOD GLUCOSE (RESULTS)   Result Value Ref Range    GLUCOSE, POC 152 50 - 500 mg/dl      Images within last 24 hours.   MRI BRAIN WO CONTRAST    Result Date: 12/08/2022  Impression NO ACUTE STROKE. SCATTERED NONSPECIFIC WHITE MATTER DISEASE. Radiologist location ID: [id number]    MRI BRAIN WO CONTRAST   Final Result   NO ACUTE STROKE.      SCATTERED NONSPECIFIC WHITE MATTER DISEASE.            Radiologist location ID: [id number]         CT BRAIN WO IV CONTRAST   Final Result   CHRONIC CHANGES.  NO ACUTE FINDINGS.          One or more dose reduction techniques were used (e.g., Automated exposure control, adjustment of the mA and/or kV according to patient size, use of iterative reconstruction technique).         Radiologist location ID: [id number]         XR KUB AND UPRIGHT ABDOMEN   Final Result   NO ACUTE FINDINGS             Radiologist location ID: [id number]         XR CHEST AP   Final Result   NO ACUTE FINDINGS.         Radiologist location ID: [id number]         CT ABDOMEN PELVIS WO IV CONTRAST   Final Result   NORMAL NONCONTRAST CT OF THE ABDOMEN AND PELVIS.          One or more dose reduction techniques were used (e.g., Automated exposure control, adjustment of the mA and/or kV according to patient size, use of iterative reconstruction technique).         Radiologist location ID: [id number]             Microbiolgy    No results found for any visits on 12/05/22 (from the past 24 hour(s)).       Joselyn Glassman, FNP-BC    Referring providers can utilize [email or web address] to access their referred [place] patient's information.

## 2022-12-10 NOTE — Care Plan (Signed)
Pt on floor, and resting in bed. Vitals baseline, no complaints at this time. Care ongoing.   Problem: Adult Inpatient Plan of Care  Goal: Plan of Care Review  Outcome: Ongoing (see interventions/notes)  Goal: Patient-Specific Goal (Individualized)  Outcome: Ongoing (see interventions/notes)  Goal: Absence of Hospital-Acquired Illness or Injury  Outcome: Ongoing (see interventions/notes)  Intervention: Identify and Manage Fall Risk  Recent Flowsheet Documentation  Taken 12/09/2022 2000 by Rico Junker, GN  Safety Promotion/Fall Prevention:   activity supervised   nonskid shoes/slippers when out of bed   safety round/check completed  Goal: Optimal Comfort and Wellbeing  Outcome: Ongoing (see interventions/notes)  Goal: Rounds/Family Conference  Outcome: Ongoing (see interventions/notes)     Problem: Health Knowledge, Opportunity to Enhance (Adult,Obstetrics,Pediatric)  Goal: Knowledgeable about Health Subject/Topic  Description: Patient will demonstrate the desired outcomes by discharge/transition of care.  Outcome: Ongoing (see interventions/notes)     Problem: Fall Injury Risk  Goal: Absence of Fall and Fall-Related Injury  Outcome: Ongoing (see interventions/notes)  Intervention: Promote Injury-Free Environment  Recent Flowsheet Documentation  Taken 12/09/2022 2000 by Rico Junker, GN  Safety Promotion/Fall Prevention:   activity supervised   nonskid shoes/slippers when out of bed   safety round/check completed     Problem: Fall Injury Risk  Goal: Absence of Fall and Fall-Related Injury  Outcome: Ongoing (see interventions/notes)  Intervention: Promote Injury-Free Environment  Recent Flowsheet Documentation  Taken 12/09/2022 2000 by Rico Junker, GN  Safety Promotion/Fall Prevention:   activity supervised   nonskid shoes/slippers when out of bed   safety round/check completed     Problem: Gas Exchange Impaired  Goal: Optimal Gas Exchange  Outcome: Ongoing (see interventions/notes)     Problem:  Diarrhea  Goal: Effective Diarrhea Management  Outcome: Ongoing (see interventions/notes)

## 2022-12-11 NOTE — Telephone Encounter (Signed)
Patient states that she was discharged from the hospital with COVID, and she states that she had no business being discharged. Patient states that she is really dissatisfied with the hospital at this time.

## 2022-12-19 ENCOUNTER — Encounter (INDEPENDENT_AMBULATORY_CARE_PROVIDER_SITE_OTHER): Payer: Self-pay | Admitting: Physician Assistant

## 2022-12-19 DIAGNOSIS — E785 Hyperlipidemia, unspecified: Secondary | ICD-10-CM | POA: Insufficient documentation

## 2022-12-19 DIAGNOSIS — E559 Vitamin D deficiency, unspecified: Secondary | ICD-10-CM | POA: Insufficient documentation

## 2022-12-24 ENCOUNTER — Ambulatory Visit: Payer: Medicare Other | Attending: Physician Assistant | Admitting: Physician Assistant

## 2022-12-24 ENCOUNTER — Other Ambulatory Visit: Payer: Self-pay

## 2022-12-24 ENCOUNTER — Other Ambulatory Visit (INDEPENDENT_AMBULATORY_CARE_PROVIDER_SITE_OTHER): Payer: Self-pay | Admitting: Physician Assistant

## 2022-12-24 ENCOUNTER — Encounter (INDEPENDENT_AMBULATORY_CARE_PROVIDER_SITE_OTHER): Payer: Self-pay | Admitting: Physician Assistant

## 2022-12-24 VITALS — BP 121/81 | HR 105 | Temp 99.1°F

## 2022-12-24 DIAGNOSIS — R5383 Other fatigue: Secondary | ICD-10-CM | POA: Insufficient documentation

## 2022-12-24 DIAGNOSIS — U071 COVID-19: Secondary | ICD-10-CM

## 2022-12-24 DIAGNOSIS — Z79899 Other long term (current) drug therapy: Secondary | ICD-10-CM | POA: Insufficient documentation

## 2022-12-24 DIAGNOSIS — G894 Chronic pain syndrome: Secondary | ICD-10-CM

## 2022-12-24 DIAGNOSIS — Z09 Encounter for follow-up examination after completed treatment for conditions other than malignant neoplasm: Secondary | ICD-10-CM | POA: Insufficient documentation

## 2022-12-24 DIAGNOSIS — M545 Low back pain, unspecified: Secondary | ICD-10-CM | POA: Insufficient documentation

## 2022-12-24 DIAGNOSIS — F32A Depression, unspecified: Secondary | ICD-10-CM | POA: Insufficient documentation

## 2022-12-24 DIAGNOSIS — G8929 Other chronic pain: Secondary | ICD-10-CM | POA: Insufficient documentation

## 2022-12-24 DIAGNOSIS — F419 Anxiety disorder, unspecified: Secondary | ICD-10-CM | POA: Insufficient documentation

## 2022-12-24 DIAGNOSIS — Z20822 Contact with and (suspected) exposure to covid-19: Secondary | ICD-10-CM | POA: Insufficient documentation

## 2022-12-24 DIAGNOSIS — Z8616 Personal history of COVID-19: Secondary | ICD-10-CM | POA: Insufficient documentation

## 2022-12-24 LAB — POCT RAPID COVID (SOFIA) (AMB ONLY): COVID-19 AG: NEGATIVE

## 2022-12-24 MED ORDER — MAGNESIUM OXIDE 400 MG (241.3 MG MAGNESIUM) TABLET
800.0000 mg | ORAL_TABLET | Freq: Every day | ORAL | 1 refills | Status: DC
Start: 2022-12-24 — End: 2023-11-11

## 2022-12-24 MED ORDER — CYANOCOBALAMIN (VIT B-12) 1,000 MCG/ML INJECTION SOLUTION
1000.0000 ug | Freq: Once | INTRAMUSCULAR | Status: AC
Start: 2022-12-24 — End: 2022-12-24
  Administered 2022-12-24: 1000 ug via INTRAMUSCULAR

## 2022-12-24 MED ORDER — DIAZEPAM 5 MG TABLET
5.0000 mg | ORAL_TABLET | Freq: Every day | ORAL | 0 refills | Status: DC
Start: 2022-12-24 — End: 2023-01-21

## 2022-12-24 MED ORDER — SUMATRIPTAN 50 MG TABLET
50.0000 mg | ORAL_TABLET | Freq: Once | ORAL | 3 refills | Status: DC | PRN
Start: 2022-12-24 — End: 2023-05-13

## 2022-12-24 NOTE — Progress Notes (Signed)
INTERNAL MEDICINE, BUILDING A  [address]  [place] [address]  Operated by [facility]  Transitional Care Management Note    Name: Grace Hoffman MRN:  [id number]   Date: 12/24/2022 Age: 72 y.o.     Chief Complaint: Hospital Discharge Transition       SUBJECTIVE:  Grace Hoffman is a 72 y.o. female presenting today for follow-up after being discharged. The main problem requiring admission was     PATIENT NAME:  Grace Hoffman, Grace Hoffman  MRN:  [id number]  DOB:  [birth year]     ADMISSION DATE:  12/05/2022  DISCHARGE DATE:  12/10/2022     ATTENDING PHYSICIAN: Luci Bank, DO   PRIMARY CARE PHYSICIAN: Cleophus Molt, [facility] Problems:        Active Hospital Problems     Diagnosis Date Noted    Principal Problem: COVID-19 [U07.1] 12/06/2022    Vertigo [R42] 05/20/2022    Diabetes mellitus, type 2 (CMS [facility]) [E11.9] 03/12/2022       Resolved Hospital Problems     Diagnosis      Syncope [R55]             Active Non-Hospital Problems     Diagnosis Date Noted    Mood disorder (CMS [facility]) 12/02/2022    Chronic back pain 11/03/2022    Bulging of lumbar intervertebral disc 11/03/2022    Essential hypertension 10/07/2022    Chronic kidney disease (CKD), stage II (mild) 10/07/2022    DJD (degenerative joint disease), lumbar 10/06/2022    Renal cyst, acquired, right 08/13/2022    Urinary incontinence 07/16/2022    Fatigue 07/16/2022    Recurrent UTI 06/16/2022    TIA (transient ischemic attack) 06/12/2022    B12 deficiency 05/20/2022    Uncontrolled type 2 diabetes mellitus with hyperglycemia (CMS HCC) 04/03/2022    Hypomagnesemia 04/03/2022    Myalgia 04/03/2022    Long-term use of high-risk medication 04/03/2022    Chronic pain 04/03/2022    Orthostatic hypotension 03/12/2022    Hypertension 03/12/2022    Osteoarthritis 03/12/2022    Anxiety 03/12/2022    Depression 03/12/2022         Allergies:       Allergies   Allergen Reactions    Codeine Nausea/ Vomiting    Amoxicillin Diarrhea    Macrobid [Nitrofurantoin  Monohyd/M-Cryst] Nausea/ Vomiting         DISCHARGE MEDICATIONS:      Current Discharge Medication List          START taking these medications.         Details   lactobacillus rhamnosus (GG) 10 B CELL/cap Capsule  Commonly known as: CULTURELLE    1 Capsule, Oral, 2 TIMES DAILY WITH FOOD  Qty: 60 Capsule  Refills: 0      meclizine 25 mg Tablet  Commonly known as: ANTIVERT    25 mg, Oral, EVERY 6 HOURS (SCHEDULED)  Qty: 56 Tablet  Refills: 0                CONTINUE these medications which have CHANGED during your visit.         Details   famotidine 20 mg Tablet  Commonly known as: PEPCID  What changed: when to take this    TAKE 1 TABLET BY MOUTH TWICE DAILY  Qty: 60 Tablet  Refills: 3                CONTINUE these medications -  NO CHANGES were made during your visit.         Details   d-mannose 500 mg Capsule    1 Capsule, Oral, 2 TIMES DAILY  Refills: 0      diazePAM 5 mg Tablet  Commonly known as: VALIUM    5 mg, Oral, DAILY  Qty: 30 Tablet  Refills: 0      enalapril maleate 10 mg Tablet  Commonly known as: VASOTEC    10 mg, Oral, DAILY  Qty: 90 Tablet  Refills: 1      ergocalciferol (vitamin D2) 1,250 mcg (50,000 unit) Capsule  Commonly known as: DRISDOL    50,000 Units, Oral, EVERY 7 DAYS  Qty: 4 Capsule  Refills: 5      fluticasone propionate 50 mcg/actuation Spray, Suspension  Commonly known as: FLONASE    1 Spray, Each Nostril, DAILY  Qty: 16 g  Refills: 0      glimepiride 4 mg Tablet  Commonly known as: AMARYL    TAKE 1 TABLET(4 MG) BY MOUTH TWICE DAILY WITH A MEAL FOR DIABETES  Qty: 180 Tablet  Refills: 1      hydroCHLOROthiazide 25 mg Tablet  Commonly known as: HYDRODIURIL    25 mg, Oral, DAILY PRN  Qty: 30 Tablet  Refills: 3      HYDROcodone-acetaminophen 7.5-325 mg Tablet  Commonly known as: NORCO    1 Tablet, Oral, EVERY 6 HOURS PRN  Qty: 90 Tablet  Refills: 0      latanoprost 0.005 % Drops  Commonly known as: XALATAN    1 Drop, Left Eye, NIGHTLY  Refills: 0      Levetiracetam 500 mg Tablet Sustained  Release 24 hr    500 mg, Oral, NIGHTLY  Qty: 90 Tablet  Refills: 1      loratadine 10 mg Tablet  Commonly known as: CLARITIN    10 mg, Oral, DAILY PRN  Qty: 90 Tablet  Refills: 3      magnesium oxide 400 mg Tablet  Commonly known as: MAG-OX    2 Tablets, Oral, DAILY  Refills: 0      MetFORMIN 1,000 mg Tablet  Commonly known as: GLUCOPHAGE    1,000 mg, Oral, 2 TIMES DAILY WITH FOOD  Qty: 180 Tablet  Refills: 1      rosuvastatin 10 mg Tablet  Commonly known as: CRESTOR    TAKE 1 TABLET BY MOUTH EVERY NIGHT AT BEDTIME  Qty: 90 Tablet  Refills: 1      solifenacin 5 mg Tablet  Commonly known as: VESICARE    TAKE 1 TABLET(5 MG) BY MOUTH EVERY DAY  Qty: 90 Tablet  Refills: 1      SUMAtriptan 50 mg Tablet  Commonly known as: IMITREX    50 mg, Oral, ONCE PRN, TAKE 1 TABLET BY MOUTH AT ONSET OF HEADACHE. IF NO RELIEF MAY REPEAT 1 AFTER AT LEAST 2 HOURS. MAX OF 4 TABS PER 24 HOURS  Qty: 18 Tablet  Refills: 3      Toujeo Max U-300 SoloStar 300 unit/mL (3 mL) Insulin Pen  Generic drug: insulin glargine U-300 conc    ADMINISTER 10 UNITS UNDER THE SKIN EVERY NIGHT  Qty: 3 mL  Refills: 2      venlafaxine 37.5 mg Capsule, Sust. Release 24 hr  Commonly known as: EFFEXOR XR    37.5 mg, Oral, NIGHTLY  Refills: 0  DISCHARGE INSTRUCTIONS:       DISCHARGE INSTRUCTION - DIET - RESUME HOME DIET      Diet: RESUME HOME DIET        DISCHARGE INSTRUCTION - ACTIVITY - MAY RESUME PREVIOUS ACTIVITY      Activity: MAY RESUME PREVIOUS ACTIVITY            DISCHARGE INSTRUCTION - FOLLOW SAFETY PLAN     Follow safety plan.          DISCHARGE INSTRUCTION - KEEP FOLLOW-UP APPOINTMENTS AS SCHEDULED     Keep follow-up appointments as scheduled.      DISCHARGE INSTRUCTION - TAKE  MEDICATIONS AS ORDERED   REASON FOR HOSPITALIZATION AND HOSPITAL COURSE: This serves as a brief accounting of the course of hospitalization. For full accounting of labs and day to day occurrences, please see daily progress notes. Grace Hoffman is a 72 y.o.,  female was admitted with acute respiratory failure due to COVID-19, diabetes, and hyperlipidemia.  Patient placed on supplemental oxygen therapy to maintain O2 saturation 90-92%.  She was also placed on DuoNebs, remdesivir, Decadron, and antibiotic therapy.  Patient's oxygen was titrated and she is on room air.  Patient did have increased diarrhea felt likely secondary to COVID.  KUB was negative.  Imodium p.r.n. was started.  She also had an episode of dizziness.  Head CT and brain MRI were negative for acute findings.  Carotid artery ultrasound was within normal limits.  Antivert was started for possible vertigo.  Physical therapy was consulted for evaluation.  Patient was able to go 40 ft with them yesterday.  Patient is improved.  Patient offered rehab but she would like to go home with home health and physical therapy.  Patient to be discharged home with family home health and physical therapy.  She is to follow up with her PCP in 1 week.  Follow up with her back surgeon as scheduled for injections on 12/26. The hospitalist examined the patient, reviewed material, and agreed with discharge at this time. This discharge took 40 minutes.      DISCHARGE ASSESSMENT:   BP (!) 152/90   Pulse 51   Temp 36.4 C (97.6 F)   Resp 20   Ht 1.676 m (5\' 6" )   Wt 89.4 kg (197 lb)   SpO2 95%   BMI 31.80 kg/m         Physical Exam  Constitutional:       General: She is not in acute distress.  Cardiovascular:      Rate and Rhythm: Normal rate and regular rhythm.   Pulmonary:      Effort: No respiratory distress.      Breath sounds: Normal breath sounds. No wheezing.   Abdominal:      General: Bowel sounds are normal. There is no distension.      Palpations: Abdomen is soft.   Musculoskeletal:         General: No swelling.   Skin:     General: Skin is warm and dry.   Neurological:      General: No focal deficit present.      Mental Status: She is alert.      DISCHARGE LABS/IMAGES:      Labs within last 24 hours.          Results for orders placed or performed during the hospital encounter of 12/05/22 (from the past 24 hour(s))   POC BLOOD GLUCOSE (RESULTS)   Result Value Ref Range  GLUCOSE, POC 389 50 - 500 mg/dl   POC BLOOD GLUCOSE (RESULTS)   Result Value Ref Range     GLUCOSE, POC 375 50 - 500 mg/dl   POC BLOOD GLUCOSE (RESULTS)   Result Value Ref Range     GLUCOSE, POC 384 50 - 500 mg/dl   CBC   Result Value Ref Range     WBC 7.1 3.8 - 11.8 x10^3/uL     RBC 3.62 (L) 3.63 - 4.92 x10^6/uL     HGB 11.7 10.9 - 14.3 g/dL     HCT 34.9 31.2 - 41.9 %     MCV 96.5 (H) 75.5 - 95.3 fL     MCH 32.2 24.7 - 32.8 pg     MCHC 33.4 32.3 - 35.6 g/dL     RDW 13.4 12.3 - 17.7 %     PLATELETS 161 140 - 440 x10^3/uL     MPV 10.9 (H) 7.9 - 10.8 fL   COMPREHENSIVE METABOLIC PANEL, NON-FASTING   Result Value Ref Range     SODIUM 137 136 - 145 mmol/L     POTASSIUM 4.5 3.5 - 5.1 mmol/L     CHLORIDE 107 98 - 107 mmol/L     CO2 TOTAL 25 21 - 31 mmol/L     ANION GAP 5 4 - 13 mmol/L     BUN 24 7 - 25 mg/dL     CREATININE 1.06 0.60 - 1.30 mg/dL     BUN/CREA RATIO 23 (H) 6 - 22     ESTIMATED GFR 55 (L) >59 mL/min/1.65m^2     ALBUMIN 3.2 (L) 3.5 - 5.7 g/dL     CALCIUM 8.5 (L) 8.6 - 10.3 mg/dL     GLUCOSE 241 (H) 74 - 109 mg/dL     ALKALINE PHOSPHATASE 53 34 - 104 U/L     ALT (SGPT) 11 7 - 52 U/L     AST (SGOT) 11 (L) 13 - 39 U/L     BILIRUBIN TOTAL 0.3 0.3 - 1.2 mg/dL     PROTEIN TOTAL 6.1 (L) 6.4 - 8.9 g/dL     ALBUMIN/GLOBULIN RATIO 1.1 0.8 - 1.4     OSMOLALITY, CALCULATED 286 270 - 290 mOsm/kg     CALCIUM, CORRECTED 9.1 8.9 - 10.8 mg/dL     GLOBULIN 2.9 2.9 - 5.4   MAGNESIUM   Result Value Ref Range     MAGNESIUM 1.7 (L) 1.9 - 2.7 mg/dL   POC BLOOD GLUCOSE (RESULTS)   Result Value Ref Range     GLUCOSE, POC 152 50 - 500 mg/dl      Images within last 24 hours.   MRI BRAIN WO CONTRAST     Result Date: 12/08/2022  Impression NO ACUTE STROKE. SCATTERED NONSPECIFIC WHITE MATTER DISEASE. Radiologist location ID: [id number]    MRI BRAIN WO CONTRAST   Final  Result   NO ACUTE STROKE.       SCATTERED NONSPECIFIC WHITE MATTER DISEASE.               Radiologist location ID: [id number]           CT BRAIN WO IV CONTRAST   Final Result   CHRONIC CHANGES.  NO ACUTE FINDINGS.            One or more dose reduction techniques were used (e.g., Automated exposure control, adjustment of the mA and/or kV according to patient size, use of iterative reconstruction technique).  Radiologist location ID: [id number]           XR KUB AND UPRIGHT ABDOMEN   Final Result   NO ACUTE FINDINGS               Radiologist location ID: [id number]           XR CHEST AP   Final Result   NO ACUTE FINDINGS.           Radiologist location ID: [id number]           CT ABDOMEN PELVIS WO IV CONTRAST   Final Result   NORMAL NONCONTRAST CT OF THE ABDOMEN AND PELVIS.            One or more dose reduction techniques were used (e.g., Automated exposure control, adjustment of the mA and/or kV according to patient size, use of iterative reconstruction technique).           Radiologist location ID: [id number]               Microbiolgy    No results found for any visits on 12/05/22 (from the past 24 hour(s)).         Joselyn Glassman, FNP-BC     Upon follow-up today patient states that she is still extremely tired/fatigued.  She would like to have her COVID test repeated to see if it is still positive because of her current symptoms.  Occasional cough noted that overall improved.  Explained to patient that even very unlikely COVID testing would be positive with recent infection however she is insistent on the test.  She also requests B12 injection for her fatigue issues.  Denies any fever chills body aches night sweats.    She also need med refill on her magnesium with history of low magnesium level previously 1.8.  Patient states she still has occasional cramps in her legs but is taking magnesium supplement.    Follow-up chronic pain/DJD/fibromyalgia for which she has been on chronic pain  therapy/narcotics for numerous years.  Patient had reported increased pain in her lower back with x-rays showing moderate DJD and MRI recently obtained in October noting:    No acute bony changes  At L1-2 level degenerative disc disease with bulging annulus and facet arthropathy with hypertrophy of distal ligament causing moderately significant compromise of the thecal sac in the midline measuring 6.7 mm AP diameter markedly significant compromise of both lateral recesses at this level  Findings at other disc levels note DJD    Patient states she does have an appointment again in follow-up with her neurosurgeon and recently had injection which did help.   She is currently taking Norco 7.5 mg q.6 hours p.r.n for her chronic pain issues.  She denies any side effects to medication and compliant w urine drug screen.  Requesting medication refill.    Follow-up anxiety/depression with patient currently on Valium 5 mg daily p.r.n. as well as Effexor 37.5 mg daily.  Patient denies panic attack and states anxiety depression overall stable at this current time.    Patient also has a follow-up with her nephrologist Dr. Darius Bump scheduled for April and will see neurologist next week Dr Bosie Helper    Allergies   Allergen Reactions    Codeine Nausea/ Vomiting    Amoxicillin Diarrhea    Macrobid [Nitrofurantoin Monohyd/M-Cryst] Nausea/ Vomiting        d-mannose 500 mg Oral Capsule, Take 1 Capsule (500 mg total) by mouth Twice daily  enalapril maleate (VASOTEC) 10  mg Oral Tablet, Take 1 Tablet (10 mg total) by mouth Once a day for 180 days  ergocalciferol, vitamin D2, (DRISDOL) 1,250 mcg (50,000 unit) Oral Capsule, Take 1 Capsule (50,000 Units total) by mouth Every 7 days  famotidine (PEPCID) 20 mg Oral Tablet, TAKE 1 TABLET BY MOUTH TWICE DAILY (Patient taking differently: Take 1 Tablet (20 mg total) by mouth Every evening)  fluticasone propionate (FLONASE) 50 mcg/actuation Nasal Spray, Suspension, Administer 1 Spray into each  nostril Once a day for 30 days  glimepiride (AMARYL) 4 mg Oral Tablet, TAKE 1 TABLET(4 MG) BY MOUTH TWICE DAILY WITH A MEAL FOR DIABETES  lactobacillus rhamnosus, GG, (CULTURELLE) 10 B CELL/cap Oral Capsule, Take 1 Capsule by mouth Twice daily with food for 30 days  latanoprost (XALATAN) 0.005 % Ophthalmic Drops, Administer 1 Drop into the left eye Every night  Levetiracetam 500 mg Oral Tablet Sustained Release 24 hr, Take 1 Tablet (500 mg total) by mouth Every night for 90 days  loratadine (CLARITIN) 10 mg Oral Tablet, Take 1 Tablet (10 mg total) by mouth Once per day as needed  meclizine (ANTIVERT) 25 mg Oral Tablet, Take 1 Tablet (25 mg total) by mouth Every 6 hours for 14 days  MetFORMIN (GLUCOPHAGE) 1,000 mg Oral Tablet, Take 1 Tablet (1,000 mg total) by mouth Twice daily with food  rosuvastatin (CRESTOR) 10 mg Oral Tablet, TAKE 1 TABLET BY MOUTH EVERY NIGHT AT BEDTIME  solifenacin (VESICARE) 5 mg Oral Tablet, TAKE 1 TABLET(5 MG) BY MOUTH EVERY DAY  TOUJEO MAX U-300 SOLOSTAR 300 unit/mL (3 mL) Subcutaneous Insulin Pen, ADMINISTER 10 UNITS UNDER THE SKIN EVERY NIGHT  venlafaxine (EFFEXOR XR) 37.5 mg Oral Capsule, Sust. Release 24 hr, Take 1 Capsule (37.5 mg total) by mouth Every night  diazePAM (VALIUM) 5 mg Oral Tablet, Take 1 Tablet (5 mg total) by mouth Once a day  hydroCHLOROthiazide (HYDRODIURIL) 25 mg Oral Tablet, Take 1 Tablet (25 mg total) by mouth Once per day as needed  HYDROcodone-acetaminophen (NORCO) 7.5-325 mg Oral Tablet, Take 1 Tablet by mouth Every 6 hours as needed for up to 30 days Indications: pain  magnesium oxide (MAG-OX) 400 mg Oral Tablet, Take 2 Tablets (800 mg total) by mouth Once a day  SUMAtriptan (IMITREX) 50 mg Oral Tablet, Take 1 Tablet (50 mg total) by mouth Once, as needed for Migraine for up to 1 dose TAKE 1 TABLET BY MOUTH AT ONSET OF HEADACHE. IF NO RELIEF MAY REPEAT 1 AFTER AT LEAST 2 HOURS. MAX OF 4 TABS PER 24 HOURS    No facility-administered medications prior to visit.      Past Medical History:   Diagnosis Date    Anxiety     Chronic back pain     COVID-19 vaccine series completed     Depression     Diabetes mellitus, type 2 (CMS HCC)     Fibromyalgia affecting multiple sites     Hypertension     Hypomagnesemia     Insomnia disorder     Migraine     Mixed hyperlipidemia     Orthostatic hypotension     Osteoarthritis     TIA (transient ischemic attack)     Unspecified glaucoma(365.9)     Vitamin D deficiency         Social History     Socioeconomic History    Marital status: Married    Number of children: 1    Years of education: 11   Tobacco Use  Smoking status: Never    Smokeless tobacco: Never   Vaping Use    Vaping Use: Never used   Substance and Sexual Activity    Alcohol use: Never    Drug use: Never    Sexual activity: Not Currently     Partners: Male     Social Determinants of Health     Social Connections: Low Risk  (12/06/2022)    Social Connections     SDOH Social Isolation: 5 or more times a week      Past Surgical History:   Procedure Laterality Date    HX CHOLECYSTECTOMY      HX KNEE REPLACMENT Left     HX ROTATOR CUFF REPAIR Left     SKIN CANCER EXCISION      forehead removed      OBJECTIVE:   BP 121/81   Pulse (!) 105   Temp 37.3 C (99.1 F)   SpO2 95%      Physical Exam  Vitals and nursing note reviewed.   Constitutional:       Appearance: Normal appearance. She is obese.   HENT:      Right Ear: Tympanic membrane normal.      Left Ear: Tympanic membrane normal.      Mouth/Throat:      Mouth: Mucous membranes are moist.   Eyes:      Extraocular Movements: Extraocular movements intact.      Pupils: Pupils are equal, round, and reactive to light.   Cardiovascular:      Rate and Rhythm: Normal rate and regular rhythm.      Pulses: Normal pulses.      Heart sounds: Normal heart sounds. No murmur heard.  Pulmonary:      Breath sounds: Normal breath sounds. No wheezing or rhonchi.   Abdominal:      General: Bowel sounds are normal.      Palpations: Abdomen is soft.       Tenderness: There is no abdominal tenderness.      Comments: Obese abdomen   Musculoskeletal:         General: No tenderness or deformity. Normal range of motion.      Cervical back: Normal range of motion and neck supple.      Comments: Chronic multiple trigger points  Arthritic changes hands and knees  Gait stable   Skin:     General: Skin is warm.      Findings: No rash.   Neurological:      General: No focal deficit present.      Mental Status: She is alert and oriented to person, place, and time.   Psychiatric:         Mood and Affect: Mood normal.         Behavior: Behavior normal.         Thought Content: Thought content normal.         Judgment: Judgment normal.      Rapid COVID results:    Negative       Transition of Care Contact Information  Discharge date: Discharge Date: Not Found  Transition Facility Type--Hospital (Inpatient or Observation)  Facility Name Interactive Contact(s):  Completed Contact: 12/11/2022  1:32 PM  Contact Method(s)-- Patient/Caregiver Telephone  Clinical Staff Name/Role who contacted--Kayla Day, CMA     Data Reviewed  Medication Reconciliation completed    Assessment and Plan    ICD-10-CM    1. Hospital discharge follow-up  Z09  2. COVID-19  U07.1 POCT Rapid Covid ([facility]) (AMB Only)    resolved      3. Fatigue, unspecified type  R53.83       4. Hypomagnesemia  E83.42       5. Chronic low back pain, unspecified back pain laterality, unspecified whether sciatica present  M54.50     G89.29       6. Chronic pain syndrome  G89.4       7. Anxiety  F41.9       8. Depression, unspecified depression type  F32.A         Other transition actions (Optional) -: Discharge documentation was reviewed    B12 injection given  Continue rest fluids  Meds refilled as noted  Continue diet blood sugar monitoring  Will follow-up with neurosurgeon/neurologist/nephrologist as scheduled      Post-Discharge Follow Up Appointments       Wednesday Jan 21, 2023    Return Patient Visit with Cleophus Molt,  PA-C at 11:30 AM      Thursday Apr 02, 2023    Return Patient Visit with Richardean Sale, MD at  1:45 PM      Thursday Apr 09, 2023    Return Telephone Visit with Cleophus Molt, PA-C at  1:30 PM      Friday Oct 09, 2023    Return Patient Visit with Landis Gandy, DO at  2:00 PM      Internal Medicine, Building A  [facility]  [address]  [place] [address]  [phone] Nephrology, [place], [place] [address]  [phone] Urology, [place], [place] [address]  [place], PA-C      I personally reviewed the documentation of care provided by the certified physician assistant and I agree with her medical decision making, Feliberto Harts, D.O.

## 2022-12-24 NOTE — Telephone Encounter (Signed)
Patient was here for routine 1 month follow up for medication refills.

## 2022-12-24 NOTE — Nursing Note (Signed)
Patient is following up from being hospitalized with COVID 12-05-2022.

## 2022-12-26 MED ORDER — HYDROCODONE 7.5 MG-ACETAMINOPHEN 325 MG TABLET
1.0000 | ORAL_TABLET | Freq: Four times a day (QID) | ORAL | 0 refills | Status: DC | PRN
Start: 2022-12-26 — End: 2023-01-21

## 2022-12-30 ENCOUNTER — Ambulatory Visit (INDEPENDENT_AMBULATORY_CARE_PROVIDER_SITE_OTHER): Payer: Medicare Other | Admitting: Physician Assistant

## 2023-01-21 ENCOUNTER — Other Ambulatory Visit (INDEPENDENT_AMBULATORY_CARE_PROVIDER_SITE_OTHER): Payer: Self-pay | Admitting: Physician Assistant

## 2023-01-21 ENCOUNTER — Other Ambulatory Visit: Payer: Self-pay

## 2023-01-21 ENCOUNTER — Encounter (INDEPENDENT_AMBULATORY_CARE_PROVIDER_SITE_OTHER): Payer: Self-pay | Admitting: Physician Assistant

## 2023-01-21 ENCOUNTER — Ambulatory Visit: Payer: Medicare Other | Attending: Physician Assistant | Admitting: Physician Assistant

## 2023-01-21 VITALS — BP 124/74 | HR 84 | Ht 66.0 in

## 2023-01-21 DIAGNOSIS — F32A Depression, unspecified: Secondary | ICD-10-CM

## 2023-01-21 DIAGNOSIS — I10 Essential (primary) hypertension: Secondary | ICD-10-CM | POA: Insufficient documentation

## 2023-01-21 DIAGNOSIS — Z7984 Long term (current) use of oral hypoglycemic drugs: Secondary | ICD-10-CM

## 2023-01-21 DIAGNOSIS — R195 Other fecal abnormalities: Secondary | ICD-10-CM

## 2023-01-21 DIAGNOSIS — E1165 Type 2 diabetes mellitus with hyperglycemia: Secondary | ICD-10-CM | POA: Insufficient documentation

## 2023-01-21 DIAGNOSIS — G8929 Other chronic pain: Secondary | ICD-10-CM

## 2023-01-21 DIAGNOSIS — M545 Low back pain, unspecified: Secondary | ICD-10-CM

## 2023-01-21 DIAGNOSIS — F419 Anxiety disorder, unspecified: Secondary | ICD-10-CM

## 2023-01-21 DIAGNOSIS — M1990 Unspecified osteoarthritis, unspecified site: Secondary | ICD-10-CM

## 2023-01-21 DIAGNOSIS — E538 Deficiency of other specified B group vitamins: Secondary | ICD-10-CM

## 2023-01-21 MED ORDER — DIAZEPAM 5 MG TABLET
5.0000 mg | ORAL_TABLET | Freq: Every day | ORAL | 0 refills | Status: DC
Start: 2023-01-21 — End: 2023-02-18

## 2023-01-21 NOTE — Nursing Note (Signed)
Patient is here for routine 1 month follow up for medication refills. Patient is requesting a medication to help with nausea and diarrhea.

## 2023-01-21 NOTE — Telephone Encounter (Signed)
Patient is here for routine 1 month follow up for medication refill.

## 2023-01-21 NOTE — Progress Notes (Signed)
INTERNAL MEDICINE, BUILDING A  [address]  [place] [address]  Operated by [facility]  History and Physical     Name: Grace Hoffman MRN:  [id number]   Date: 01/21/2023 Age: 72 y.o.               Follow Up        Reason for Visit: Follow Up (Routine 1 month ) loose stools med refill    History of Present Illness  Grace Hoffman is a 71 y.o. female who is being seen today in the office for f/u however has complaints of loose stools with increased gas noted.  Patient states she has had issues ever since she had COVID right after Christmas so not sure if related.  Occasionally mucus noted in the stool however no blood.  Patient denies any abdominal pain but once again has increased gas flatulence.  Patient states she at least has to loose stools daily if not more.    Follow-up  blood pressure/hypertension currently 124/74.  She is currently taking Vasotec 10 mg daily as well as hydrochlorothiazide 25 mg daily p.r.n.Marland Kitchen   Denies chest pain.  She does continue to have chronic dizziness for which she is seeing the neurologist and has had a MRI of her brain which noted signs of TIAs.  She states she is currently doing physical therapy for the dizziness and gait issues which seems to be helping.  Occasional headaches noted also.  She has a follow-up with Dr. Bosie Helper scheduled.  Note patient also has been seen by the ENT where she had her crystals realigned.  She states this helped a little bit but she feels like her balance is still an issue.    F/U BS w history of uncontrolled diabetes with last hemoglobin 8.0 which was up from previous A1c 7.3.  Currently patient is on metformin at 1000 mg twice daily along with glimepiride 4 mg twice daily and most recently added Toujeo 10 units nightly.  Noted she was unable to tolerate being injectables such as Ozempic and Mounjaro due to GI side effects.  States blood sugar is a little bit better and  Denies increased thirst or urination.  Patient  wanting to wait on labs till next visit.    Follow-up B12 deficiency with chronic fatigue issues noted.  Last B12 level stable.    Follow-up chronic pain/DJD/fibromyalgia for which she has been on chronic pain therapy/narcotics for numerous years.  Patient had reported increased pain in her lower back with x-rays showing moderate DJD the MRI of spine ordered.  She did have an MRI was referred to [facility] neurosurgeon who she states she had an appointment on December 26 to obtain injections in her back area.  She also is currently taking Norco 7.5 mg q.6 hours p.r.n. which does help with her chronic pain issues.  She denies any side effects to medication and compliant w urine drug screen.  Requesting medication refill.    Follow-up anxiety/depression with patient currently on Valium 5 mg daily p.r.n. as well as Effexor 37.5 mg daily.  Patient denies panic attack and states anxiety depression overall stable at this current time.    PHQ Questionnaire         Past Medical History:   Diagnosis Date    Anxiety     Chronic back pain     COVID-19 vaccine series completed     Depression     Diabetes mellitus, type 2 (CMS HCC)  Fibromyalgia affecting multiple sites     Hypertension     Hypomagnesemia     Insomnia disorder     Migraine     Mixed hyperlipidemia     Orthostatic hypotension     Osteoarthritis     TIA (transient ischemic attack)     Unspecified glaucoma(365.9)     Vitamin D deficiency          Past Surgical History:   Procedure Laterality Date    HX CHOLECYSTECTOMY      HX KNEE REPLACMENT Left     HX ROTATOR CUFF REPAIR Left     SKIN CANCER EXCISION      forehead removed      Family Medical History:       Problem Relation (Age of Onset)    Kidney failure Mother    No Known Problems Sister, Brother, Half-Sister, Half-Brother, Maternal Aunt, Maternal Uncle, Paternal Aunt, Paternal Uncle, Maternal Grandmother, Maternal Grandfather, Paternal Grandmother, Paternal Grandfather, Daughter, Son    Respiratory Problems  Father            Social History     Tobacco Use    Smoking status: Never    Smokeless tobacco: Never   Vaping Use    Vaping Use: Never used   Substance Use Topics    Alcohol use: Never    Drug use: Never     Medication:  d-mannose 500 mg Oral Capsule, Take 1 Capsule (500 mg total) by mouth Twice daily  enalapril maleate (VASOTEC) 10 mg Oral Tablet, Take 1 Tablet (10 mg total) by mouth Once a day for 180 days  ergocalciferol, vitamin D2, (DRISDOL) 1,250 mcg (50,000 unit) Oral Capsule, Take 1 Capsule (50,000 Units total) by mouth Every 7 days  famotidine (PEPCID) 20 mg Oral Tablet, TAKE 1 TABLET BY MOUTH TWICE DAILY (Patient taking differently: Take 1 Tablet (20 mg total) by mouth Every evening)  glimepiride (AMARYL) 4 mg Oral Tablet, TAKE 1 TABLET(4 MG) BY MOUTH TWICE DAILY WITH A MEAL FOR DIABETES  HYDROcodone-acetaminophen (NORCO) 7.5-325 mg Oral Tablet, Take 1 Tablet by mouth Every 6 hours as needed for up to 30 days Indications: pain  latanoprost (XALATAN) 0.005 % Ophthalmic Drops, Administer 1 Drop into the left eye Every night  Levetiracetam 500 mg Oral Tablet Sustained Release 24 hr, Take 1 Tablet (500 mg total) by mouth Every night for 90 days  loratadine (CLARITIN) 10 mg Oral Tablet, Take 1 Tablet (10 mg total) by mouth Once per day as needed  magnesium oxide (MAG-OX) 400 mg Oral Tablet, Take 2 Tablets (800 mg total) by mouth Once a day  MetFORMIN (GLUCOPHAGE) 1,000 mg Oral Tablet, Take 1 Tablet (1,000 mg total) by mouth Twice daily with food  rosuvastatin (CRESTOR) 10 mg Oral Tablet, TAKE 1 TABLET BY MOUTH EVERY NIGHT AT BEDTIME  solifenacin (VESICARE) 5 mg Oral Tablet, TAKE 1 TABLET(5 MG) BY MOUTH EVERY DAY  SUMAtriptan (IMITREX) 50 mg Oral Tablet, Take 1 Tablet (50 mg total) by mouth Once, as needed for Migraine for up to 1 dose TAKE 1 TABLET BY MOUTH AT ONSET OF HEADACHE. IF NO RELIEF MAY REPEAT 1 AFTER AT LEAST 2 HOURS. MAX OF 4 TABS PER 24 HOURS  TOUJEO MAX U-300 SOLOSTAR 300 unit/mL (3 mL)  Subcutaneous Insulin Pen, ADMINISTER 10 UNITS UNDER THE SKIN EVERY NIGHT  venlafaxine (EFFEXOR XR) 37.5 mg Oral Capsule, Sust. Release 24 hr, Take 1 Capsule (37.5 mg total) by mouth Every night  diazePAM (VALIUM) 5 mg  Oral Tablet, Take 1 Tablet (5 mg total) by mouth Once a day    No facility-administered medications prior to visit.    Allergies:  Allergies   Allergen Reactions    Codeine Nausea/ Vomiting    Amoxicillin Diarrhea    Macrobid [Nitrofurantoin Monohyd/M-Cryst] Nausea/ Vomiting       Physical Exam:  Vitals:    01/21/23 1159   BP: 124/74   Pulse: 84   SpO2: 97%   Height: 1.676 m (5\' 6" )      Physical Exam  Vitals and nursing note reviewed.   Constitutional:       Appearance: Normal appearance. She is obese.   HENT:      Right Ear: Tympanic membrane normal.      Left Ear: Tympanic membrane normal.      Mouth/Throat:      Mouth: Mucous membranes are moist.      Pharynx: Oropharynx is clear.   Eyes:      Extraocular Movements: Extraocular movements intact.      Pupils: Pupils are equal, round, and reactive to light.   Cardiovascular:      Rate and Rhythm: Normal rate and regular rhythm.      Pulses: Normal pulses.   Pulmonary:      Effort: Pulmonary effort is normal.      Breath sounds: Normal breath sounds.   Abdominal:      General: Bowel sounds are normal.      Palpations: Abdomen is soft.      Tenderness: There is no abdominal tenderness. There is no right CVA tenderness, left CVA tenderness or rebound.   Musculoskeletal:         General: No swelling or tenderness. Normal range of motion.      Cervical back: Normal range of motion and neck supple.      Comments: Arthritic changes hands and knees noted bilateral  Chronic trigger points upper back and thigh area  Chronic low back pain with range of motion  Negative straight leg raises  Gait stable   Skin:     General: Skin is warm.      Findings: No lesion or rash.   Neurological:      General: No focal deficit present.      Mental Status: She is alert and  oriented to person, place, and time.      Sensory: No sensory deficit.      Motor: Weakness (Strength of upper and lower extremities decreased however equal bilateral) present.      Gait: Gait normal.   Psychiatric:         Mood and Affect: Mood normal.         Behavior: Behavior normal.         Thought Content: Thought content normal.         Judgment: Judgment normal.         Assessment/Plan:  Problem List Items Addressed This Visit          Cardiovascular System    Essential hypertension       Endocrine    Uncontrolled type 2 diabetes mellitus with hyperglycemia (CMS HCC)    B12 deficiency       Musculoskeletal    Osteoarthritis    Chronic back pain       Psychiatric    Anxiety    Depression     Other Visit Diagnoses       Loose stools    -  Primary  Patient refusing labs today will obtain at next visit  Samples of xifanan 550 mg t.i.d. times 14 days given  Parke Simmers diet encouraged  Meds refilled as noted  Continue blood pressure/blood sugar monitoring outpatient  Refuses colonoscopy  To follow-up with nephrologist/urologist /neurologist /neurosurgeon as scheduled       Post-Discharge Follow Up Appointments       Wednesday Feb 18, 2023    Return Patient Visit with Eyvonne Mechanic, PA-C at  1:30 PM      Wednesday Mar 18, 2023    Return Patient Visit with Eyvonne Mechanic, PA-C at 10:30 AM      Thursday Apr 02, 2023    Return Patient Visit with Janyce Llanos, MD at  1:45 PM      Wednesday Apr 22, 2023    Return Telephone Visit with Eyvonne Mechanic, PA-C at  1:30 PM      Friday Oct 09, 2023    Return Patient Visit with Arlana Hove, DO at  2:00 PM      Internal Medicine, Building A  Building [facility]  [address]  [place] [address]  [phone] Nephrology, [facility] Professional [facility] Professional [facility]  [address]  [place] [address]  [phone] Urology, [facility] Professional [facility] Professional [place], [place]  [address]  [place]  [address]  [phone]              Seek medical attention for new or worsening symptoms.  Patient has been seen in this clinic within the last 3 years.     Amy Hyman Bible, PA-C      I personally reviewed the documentation of care provided by the certified physician assistant and I agree with her medical decision making, Weyman Pedro, D.O.       This note was partially created using MModal Fluency Direct system (voice recognition software) and is inherently subject to errors including those of syntax and "sound-alike" substitutions which may escape proofreading.  In such instances, original meaning may be extrapolated by contextual derivation.

## 2023-01-22 MED ORDER — HYDROCODONE 7.5 MG-ACETAMINOPHEN 325 MG TABLET
1.0000 | ORAL_TABLET | Freq: Four times a day (QID) | ORAL | 0 refills | Status: DC | PRN
Start: 2023-01-22 — End: 2023-02-18

## 2023-02-18 ENCOUNTER — Other Ambulatory Visit (INDEPENDENT_AMBULATORY_CARE_PROVIDER_SITE_OTHER): Payer: Self-pay | Admitting: Physician Assistant

## 2023-02-18 ENCOUNTER — Other Ambulatory Visit: Payer: Self-pay

## 2023-02-18 ENCOUNTER — Encounter (INDEPENDENT_AMBULATORY_CARE_PROVIDER_SITE_OTHER): Payer: Self-pay | Admitting: Physician Assistant

## 2023-02-18 ENCOUNTER — Ambulatory Visit: Payer: Medicare Other | Attending: Physician Assistant | Admitting: Physician Assistant

## 2023-02-18 VITALS — BP 116/78 | HR 78 | Temp 98.6°F | Wt 199.0 lb

## 2023-02-18 DIAGNOSIS — E785 Hyperlipidemia, unspecified: Secondary | ICD-10-CM | POA: Insufficient documentation

## 2023-02-18 DIAGNOSIS — F39 Unspecified mood [affective] disorder: Secondary | ICD-10-CM | POA: Insufficient documentation

## 2023-02-18 DIAGNOSIS — I10 Essential (primary) hypertension: Secondary | ICD-10-CM | POA: Insufficient documentation

## 2023-02-18 DIAGNOSIS — E1165 Type 2 diabetes mellitus with hyperglycemia: Secondary | ICD-10-CM | POA: Insufficient documentation

## 2023-02-18 DIAGNOSIS — Z79899 Other long term (current) drug therapy: Secondary | ICD-10-CM | POA: Insufficient documentation

## 2023-02-18 DIAGNOSIS — M545 Low back pain, unspecified: Secondary | ICD-10-CM | POA: Insufficient documentation

## 2023-02-18 DIAGNOSIS — E538 Deficiency of other specified B group vitamins: Secondary | ICD-10-CM | POA: Insufficient documentation

## 2023-02-18 DIAGNOSIS — R5383 Other fatigue: Secondary | ICD-10-CM | POA: Insufficient documentation

## 2023-02-18 DIAGNOSIS — N182 Chronic kidney disease, stage 2 (mild): Secondary | ICD-10-CM | POA: Insufficient documentation

## 2023-02-18 DIAGNOSIS — G8929 Other chronic pain: Secondary | ICD-10-CM | POA: Insufficient documentation

## 2023-02-18 LAB — MICROALBUMIN/CREATININE RATIO, URINE, RANDOM
CREATININE RANDOM URINE: 145 mg/dL — ABNORMAL HIGH (ref 30–125)
MICROALBUMIN RANDOM URINE: 0.9 mg/dL
MICROALBUMIN/CREATININE RATIO RANDOM URINE: 6.2 mg/g

## 2023-02-18 LAB — VITAMIN B12: VITAMIN B 12: >1500 pg/mL — ABNORMAL HIGH (ref 180–914)

## 2023-02-18 LAB — URINALYSIS, MACROSCOPIC
BILIRUBIN: NEGATIVE mg/dL
BLOOD: NEGATIVE mg/dL
GLUCOSE: 500 mg/dL — AB
KETONES: NEGATIVE mg/dL
LEUKOCYTES: 75 WBCs/uL — AB
NITRITE: NEGATIVE
PH: 5 (ref 5.0–9.0)
PROTEIN: NEGATIVE mg/dL
SPECIFIC GRAVITY: 1.027 (ref 1.002–1.030)
UROBILINOGEN: NORMAL mg/dL

## 2023-02-18 LAB — CBC WITH DIFF
BASOPHIL #: 0 10*3/uL (ref 0.00–0.10)
BASOPHIL %: 1 % (ref 0–1)
EOSINOPHIL #: 0.2 10*3/uL (ref 0.00–0.50)
EOSINOPHIL %: 4 %
HCT: 39 % (ref 31.2–41.9)
HGB: 13 g/dL (ref 10.9–14.3)
LYMPHOCYTE #: 2.3 10*3/uL (ref 1.00–3.00)
LYMPHOCYTE %: 44 % (ref 16–44)
MCH: 33.2 pg — ABNORMAL HIGH (ref 24.7–32.8)
MCHC: 33.4 g/dL (ref 32.3–35.6)
MCV: 99.5 fL — ABNORMAL HIGH (ref 75.5–95.3)
MONOCYTE #: 0.4 10*3/uL (ref 0.30–1.00)
MONOCYTE %: 7 % (ref 5–13)
MPV: 10.4 fL (ref 7.9–10.8)
NEUTROPHIL #: 2.4 10*3/uL (ref 1.85–7.80)
NEUTROPHIL %: 44 % (ref 43–77)
PLATELETS: 206 10*3/uL (ref 140–440)
RBC: 3.92 10*6/uL (ref 3.63–4.92)
RDW: 13.3 % (ref 12.3–17.7)
WBC: 5.4 10*3/uL (ref 3.8–11.8)

## 2023-02-18 LAB — COMPREHENSIVE METABOLIC PNL, FASTING
ALBUMIN/GLOBULIN RATIO: 1.2 (ref 0.8–1.4)
ALBUMIN: 3.7 g/dL (ref 3.5–5.7)
ALKALINE PHOSPHATASE: 64 U/L (ref 34–104)
ALT (SGPT): 10 U/L (ref 7–52)
ANION GAP: 6 mmol/L (ref 4–13)
AST (SGOT): 18 U/L (ref 13–39)
BILIRUBIN TOTAL: 0.3 mg/dL (ref 0.3–1.2)
BUN/CREA RATIO: 16 (ref 6–22)
BUN: 15 mg/dL (ref 7–25)
CALCIUM, CORRECTED: 9.8 mg/dL (ref 8.9–10.8)
CALCIUM: 9.6 mg/dL (ref 8.6–10.3)
CHLORIDE: 105 mmol/L (ref 98–107)
CO2 TOTAL: 25 mmol/L (ref 21–31)
CREATININE: 0.92 mg/dL (ref 0.60–1.30)
ESTIMATED GFR: 65 mL/min/{1.73_m2} (ref >59)
GLOBULIN: 3.2 (ref 2.9–5.4)
GLUCOSE: 324 mg/dL — ABNORMAL HIGH (ref 74–109)
OSMOLALITY, CALCULATED: 286 mOsm/kg (ref 270–290)
POTASSIUM: 5.1 mmol/L (ref 3.5–5.1)
PROTEIN TOTAL: 6.9 g/dL (ref 6.4–8.9)
SODIUM: 136 mmol/L (ref 136–145)

## 2023-02-18 LAB — LIPID PANEL
CHOL/HDL RATIO: 5.6
CHOLESTEROL: 218 mg/dL — ABNORMAL HIGH (ref <200)
HDL CHOL: 39 mg/dL (ref 23–92)
LDL CALC: 120 mg/dL — ABNORMAL HIGH (ref 0–100)
TRIGLYCERIDES: 293 mg/dL — ABNORMAL HIGH (ref <=150)
VLDL CALC: 59 mg/dL — ABNORMAL HIGH (ref 0–50)

## 2023-02-18 LAB — URINALYSIS, MICROSCOPIC
HYALINE CASTS: 1 /lpf — ABNORMAL HIGH (ref <0)
RBCS: 4 /hpf — ABNORMAL HIGH (ref <4)
SQUAMOUS EPITHELIAL: 2 /hpf (ref <28)
WBCS: 6 /hpf — ABNORMAL HIGH (ref <6)

## 2023-02-18 LAB — FOLATE: FOLATE: 14.4 ng/mL (ref 5.9–24.4)

## 2023-02-18 MED ORDER — ERGOCALCIFEROL (VITAMIN D2) 1,250 MCG (50,000 UNIT) CAPSULE
50000.0000 [IU] | ORAL_CAPSULE | ORAL | 5 refills | Status: DC
Start: 2023-02-18 — End: 2023-06-10

## 2023-02-18 MED ORDER — DIAZEPAM 5 MG TABLET
5.0000 mg | ORAL_TABLET | Freq: Every day | ORAL | 0 refills | Status: DC
Start: 2023-02-18 — End: 2023-03-18

## 2023-02-18 MED ORDER — CYANOCOBALAMIN (VIT B-12) 1,000 MCG/ML INJECTION SOLUTION
1000.0000 ug | Freq: Once | INTRAMUSCULAR | Status: AC
Start: 2023-02-18 — End: 2023-02-18
  Administered 2023-02-18: 1000 ug via INTRAMUSCULAR

## 2023-02-18 NOTE — Telephone Encounter (Signed)
Pt here for 36mCDM for med refills

## 2023-02-18 NOTE — Nursing Note (Signed)
Pt here for 89mCDM

## 2023-02-18 NOTE — Progress Notes (Signed)
INTERNAL MEDICINE, BUILDING A  [address]  [place] [address]  Operated by [facility]  History and Physical     Name: Grace Hoffman MRN:  [id number]   Date: 02/18/2023 Age: 72 y.o.               Follow Up        Reason for Visit: Follow Up medication refill labs B12 shot    History of Present Illness  Grace Hoffman is a 72 y.o. female who is being seen today in the office for f/u concerning blood pressure/hypertension currently 116/78  She is currently taking Vasotec 10 mg daily as well as hydrochlorothiazide 25 mg daily p.r.n.Marland Kitchen   Denies chest pain.  She is now following with nephrologist due to chronic kidney disease related to her blood pressure as well as diabetes..  She does continue to have chronic dizziness for which she is seeing the neurologist and has had a MRI of her brain which noted signs of TIAs.  She states she is currently doing physical therapy for the dizziness and gait issues which seems to be helping.  Occasional headaches noted also.  She has a follow-up with Dr. Bosie Helper scheduled.  Note patient also has been seen by the ENT where she had her crystals realigned.  She states this helped a little bit but she feels like her balance is still an issue.    Date:              10/07/2022 Age:            72 y.o.            Nephrology New Patient Visit         Reason for the visit/consultation:  CKD 2  HPI : 72 y.o. white female patient with CKD 2 diabetic type 2 hypertensive poorly controlled will increase lisinopril 5 mg twice a day keep checking blood pressure stay on a low-salt diet make sure blood pressure is better controlled follow-up with primary care doctor mild chronic kidney disease GFR more 66 serum creatinine 0.92 hemoglobin is 13.2 hemoglobin A1c 8.0 magnesium 1.8 start supplement magnesium oxide 400 mg once a day recurrent UTI in D mannose drink plenty of fluids avoid NSAIDs keep sugar blood pressure controlled follow-up with Urology for UTI recurrent and  urine incontinence continue treatments per Urology will start her on magnesium oxide and out increase lisinopril 5 mg twice a day repeat lab work see her back in 6 months check kidney ultrasound       ASSESSMENT / PLAN:        ENCOUNTER DIAGNOSES       ICD-10-CM   1. Chronic kidney disease (CKD), stage II (mild)  N18.2   2. Urinary incontinence, unspecified type  R32   3. Renal cyst, acquired, right  N28.1   4. Hypomagnesemia  E83.42   5. Recurrent UTI  N39.0   6. Essential hypertension  I10   7. Type 2 diabetes mellitus (CMS HCC)  E11.9      Plan:  CKD stage 2 mild chronic kidney disease keep sugar blood pressure controlled drink plenty of fluids avoid NSAIDs repeat lab work see her back could check lab and see her back in 6 months  Essential hypertension poorly controlled blood pressure increase lisinopril 5 mg twice a day keep checking blood pressure low-salt diet follow-up with primary care doctor make sure blood pressure is better controlled  Recurrent UTI started on a  medicine per Urology continue to follow on that  Renal cysts check kidney ultrasound  Hypomagnesemia start magnesium oxide supplements 400 mg once a day  Richardean Sale, MD     F/U BS w history of uncontrolled diabetes with last hemoglobin 8.0 which was up from previous A1c 7.3.  Currently patient is on metformin at 1000 mg twice daily along with glimepiride 4 mg twice daily and most recently added Toujeo 10 units nightly.  Noted she was unable to tolerate being injectables such as Ozempic and Mounjaro due to GI side effects.  States blood sugar is a little bit better and  Denies increased thirst or urination.  Follow-up labs needed today.    Follow-up B12 deficiency with chronic fatigue issues noted.  Patient is requesting B12 injection today which do seem to help with her energy level.  Last B12 level when checked in lab stable however.    Follow-up cholesterol/lipids which patient is currently on Crestor 10 mg nightly.  She denies any side  effects or issues with medication follow-up labs needed.    Follow-up chronic pain/DJD/fibromyalgia for which she has been on chronic pain therapy/narcotics for numerous years.  Patient had reported increased pain in her lower back with x-rays showing moderate DJD the MRI of spine ordered.  She did have an MRI was referred to [facility] neurosurgeon and has received injections in her back along with physical therapy which is helping.  She also is currently taking Norco 7.5 mg q.6 hours p.r.n. which does help with her chronic pain issues.  She denies any side effects to medication and compliant w urine drug screen.  Requesting medication refill.    Follow-up anxiety/depression with patient currently on Valium 5 mg daily p.r.n. as well as Effexor 37.5 mg daily.  Patient denies panic attack and states anxiety depression overall stable at this current time.    Also noted on recent labs magnesium low at 1.7.  Patient is taking magnesium supplement denies any leg cramps.    PHQ Questionnaire         Past Medical History:   Diagnosis Date    Anxiety     Chronic back pain     COVID-19 vaccine series completed     Depression     Diabetes mellitus, type 2 (CMS HCC)     Fibromyalgia affecting multiple sites     Hypertension     Hypomagnesemia     Insomnia disorder     Migraine     Mixed hyperlipidemia     Orthostatic hypotension     Osteoarthritis     TIA (transient ischemic attack)     Unspecified glaucoma(365.9)     Vitamin D deficiency          Past Surgical History:   Procedure Laterality Date    HX CHOLECYSTECTOMY      HX KNEE REPLACMENT Left     HX ROTATOR CUFF REPAIR Left     SKIN CANCER EXCISION      forehead removed      Family Medical History:       Problem Relation (Age of Onset)    Kidney failure Mother    No Known Problems Sister, Brother, Half-Sister, Half-Brother, Maternal Aunt, Maternal Uncle, Paternal 58, Paternal Uncle, Maternal Grandmother, Maternal Grandfather, Paternal 46, Paternal 43,  Daughter, Son    Respiratory Problems Father            Social History     Tobacco Use    Smoking status:  Never    Smokeless tobacco: Never   Vaping Use    Vaping status: Never Used   Substance Use Topics    Alcohol use: Never    Drug use: Never     Medication:  d-mannose 500 mg Oral Capsule, Take 1 Capsule (500 mg total) by mouth Twice daily  enalapril maleate (VASOTEC) 10 mg Oral Tablet, Take 1 Tablet (10 mg total) by mouth Once a day for 180 days  famotidine (PEPCID) 20 mg Oral Tablet, TAKE 1 TABLET BY MOUTH TWICE DAILY (Patient taking differently: Take 1 Tablet (20 mg total) by mouth Every evening)  glimepiride (AMARYL) 4 mg Oral Tablet, TAKE 1 TABLET(4 MG) BY MOUTH TWICE DAILY WITH A MEAL FOR DIABETES  HYDROcodone-acetaminophen (NORCO) 7.5-325 mg Oral Tablet, Take 1 Tablet by mouth Every 6 hours as needed for up to 30 days Indications: pain  latanoprost (XALATAN) 0.005 % Ophthalmic Drops, Administer 1 Drop into the left eye Every night  Levetiracetam 500 mg Oral Tablet Sustained Release 24 hr, Take 1 Tablet (500 mg total) by mouth Every night for 90 days  loratadine (CLARITIN) 10 mg Oral Tablet, Take 1 Tablet (10 mg total) by mouth Once per day as needed  magnesium oxide (MAG-OX) 400 mg Oral Tablet, Take 2 Tablets (800 mg total) by mouth Once a day  MetFORMIN (GLUCOPHAGE) 1,000 mg Oral Tablet, Take 1 Tablet (1,000 mg total) by mouth Twice daily with food  rosuvastatin (CRESTOR) 10 mg Oral Tablet, TAKE 1 TABLET BY MOUTH EVERY NIGHT AT BEDTIME  solifenacin (VESICARE) 5 mg Oral Tablet, TAKE 1 TABLET(5 MG) BY MOUTH EVERY DAY  SUMAtriptan (IMITREX) 50 mg Oral Tablet, Take 1 Tablet (50 mg total) by mouth Once, as needed for Migraine for up to 1 dose TAKE 1 TABLET BY MOUTH AT ONSET OF HEADACHE. IF NO RELIEF MAY REPEAT 1 AFTER AT LEAST 2 HOURS. MAX OF 4 TABS PER 24 HOURS  TOUJEO MAX U-300 SOLOSTAR 300 unit/mL (3 mL) Subcutaneous Insulin Pen, ADMINISTER 10 UNITS UNDER THE SKIN EVERY NIGHT  venlafaxine (EFFEXOR XR) 37.5  mg Oral Capsule, Sust. Release 24 hr, Take 1 Capsule (37.5 mg total) by mouth Every night  diazePAM (VALIUM) 5 mg Oral Tablet, Take 1 Tablet (5 mg total) by mouth Once a day  ergocalciferol, vitamin D2, (DRISDOL) 1,250 mcg (50,000 unit) Oral Capsule, Take 1 Capsule (50,000 Units total) by mouth Every 7 days    No facility-administered medications prior to visit.    Allergies:  Allergies   Allergen Reactions    Codeine Nausea/ Vomiting    Amoxicillin Diarrhea    Macrobid [Nitrofurantoin Monohyd/M-Cryst] Nausea/ Vomiting       Physical Exam:  Vitals:    02/18/23 1345   BP: 116/78   Pulse: 78   Temp: 37 C (98.6 F)   SpO2: 100%   Weight: 90.3 kg (199 lb)      Physical Exam  Vitals and nursing note reviewed.   Constitutional:       Appearance: Normal appearance. She is obese.   HENT:      Right Ear: Tympanic membrane normal.      Left Ear: Tympanic membrane normal.      Mouth/Throat:      Mouth: Mucous membranes are moist.      Pharynx: Oropharynx is clear.   Eyes:      Extraocular Movements: Extraocular movements intact.      Pupils: Pupils are equal, round, and reactive to light.   Cardiovascular:  Rate and Rhythm: Normal rate and regular rhythm.      Pulses: Normal pulses.   Pulmonary:      Effort: Pulmonary effort is normal.      Breath sounds: Normal breath sounds.   Abdominal:      General: Bowel sounds are normal.      Palpations: Abdomen is soft.      Tenderness: There is no abdominal tenderness. There is no right CVA tenderness, left CVA tenderness or rebound.   Musculoskeletal:         General: No swelling or tenderness. Normal range of motion.      Cervical back: Normal range of motion and neck supple.      Comments: Arthritic changes hands and knees noted bilateral  Chronic trigger points upper back and thigh area  Chronic low back pain with range of motion  Negative straight leg raises  Gait stable   Skin:     General: Skin is warm.      Findings: No lesion or rash.   Neurological:      General: No  focal deficit present.      Mental Status: She is alert and oriented to person, place, and time.      Sensory: No sensory deficit.      Motor: Weakness (Strength of upper and lower extremities decreased however equal bilateral) present.      Gait: Gait normal.   Psychiatric:         Mood and Affect: Mood normal.         Behavior: Behavior normal.         Thought Content: Thought content normal.         Judgment: Judgment normal.         Assessment/Plan:  Problem List Items Addressed This Visit       Uncontrolled type 2 diabetes mellitus with hyperglycemia (CMS HCC)    Relevant Orders    COMPREHENSIVE METABOLIC PNL, FASTING (Completed)    HGA1C (HEMOGLOBIN A1C WITH EST AVG GLUCOSE)    URINALYSIS, MACROSCOPIC AND MICROSCOPIC W/CULTURE REFLEX (Completed)    MICROALBUMIN/CREATININE RATIO, URINE, RANDOM (Completed)    URINE CULTURE,ROUTINE    Hypomagnesemia    Long-term use of high-risk medication    B12 deficiency    Relevant Orders    CBC/DIFF (Completed)    VITAMIN B12 (Completed)    Fatigue    Relevant Orders    FOLATE (Completed)    Essential hypertension - Primary    Chronic kidney disease (CKD), stage II (mild)    Chronic back pain    Mood disorder (CMS HCC)    Hyperlipidemia    Relevant Orders    LIPID PANEL (Completed)         Labs obtained in office   Meds refilled as noted   B12 shot given  Continue blood pressure/blood sugar monitoring outpatient  Refuses colonoscopy  To follow-up with nephrologist/urologist /neurologist as well as neurosurgeon as scheduled   Will follow-up pending lab results otherwise as scheduled    Return in about 1 month (around 03/19/2023) for In Person Visit.        Seek medical attention for new or worsening symptoms.  Patient has been seen in this clinic within the last 3 years.     Amy Lavone Orn, PA-C          I personally reviewed the documentation of care provided by the certified physician assistant and I agree with her medical decision making, Feliberto Harts, D.O.  This note was  partially created using MModal Fluency Direct system (voice recognition software) and is inherently subject to errors including those of syntax and "sound-alike" substitutions which may escape proofreading.  In such instances, original meaning may be extrapolated by contextual derivation.

## 2023-02-19 LAB — HGA1C (HEMOGLOBIN A1C WITH EST AVG GLUCOSE): HEMOGLOBIN A1C: 9.1 % — ABNORMAL HIGH (ref 4.0–6.0)

## 2023-02-19 MED ORDER — HYDROCODONE 7.5 MG-ACETAMINOPHEN 325 MG TABLET
1.0000 | ORAL_TABLET | Freq: Four times a day (QID) | ORAL | 0 refills | Status: DC | PRN
Start: 2023-02-19 — End: 2023-03-18

## 2023-02-20 LAB — URINE CULTURE,ROUTINE: URINE CULTURE: 5000 — AB

## 2023-02-23 ENCOUNTER — Other Ambulatory Visit (INDEPENDENT_AMBULATORY_CARE_PROVIDER_SITE_OTHER): Payer: Self-pay

## 2023-02-25 ENCOUNTER — Telehealth (INDEPENDENT_AMBULATORY_CARE_PROVIDER_SITE_OTHER): Payer: Self-pay | Admitting: Physician Assistant

## 2023-03-02 ENCOUNTER — Other Ambulatory Visit (INDEPENDENT_AMBULATORY_CARE_PROVIDER_SITE_OTHER): Payer: Self-pay | Admitting: Physician Assistant

## 2023-03-02 MED ORDER — DULAGLUTIDE 0.75 MG/0.5 ML SUBCUTANEOUS PEN INJECTOR
0.7500 mg | PEN_INJECTOR | SUBCUTANEOUS | 1 refills | Status: DC
Start: 2023-03-02 — End: 2023-03-18

## 2023-03-18 ENCOUNTER — Other Ambulatory Visit (INDEPENDENT_AMBULATORY_CARE_PROVIDER_SITE_OTHER): Payer: Self-pay | Admitting: Physician Assistant

## 2023-03-18 ENCOUNTER — Encounter (INDEPENDENT_AMBULATORY_CARE_PROVIDER_SITE_OTHER): Payer: Self-pay | Admitting: Physician Assistant

## 2023-03-18 ENCOUNTER — Other Ambulatory Visit: Payer: Self-pay

## 2023-03-18 ENCOUNTER — Ambulatory Visit: Payer: Medicare Other | Attending: Physician Assistant | Admitting: Physician Assistant

## 2023-03-18 VITALS — BP 122/78 | HR 72 | Temp 97.1°F | Ht 66.0 in | Wt 198.0 lb

## 2023-03-18 DIAGNOSIS — G8929 Other chronic pain: Secondary | ICD-10-CM

## 2023-03-18 DIAGNOSIS — M545 Low back pain, unspecified: Secondary | ICD-10-CM

## 2023-03-18 DIAGNOSIS — Z7984 Long term (current) use of oral hypoglycemic drugs: Secondary | ICD-10-CM

## 2023-03-18 DIAGNOSIS — F419 Anxiety disorder, unspecified: Secondary | ICD-10-CM

## 2023-03-18 DIAGNOSIS — E1165 Type 2 diabetes mellitus with hyperglycemia: Secondary | ICD-10-CM

## 2023-03-18 DIAGNOSIS — E538 Deficiency of other specified B group vitamins: Secondary | ICD-10-CM

## 2023-03-18 DIAGNOSIS — Z794 Long term (current) use of insulin: Secondary | ICD-10-CM

## 2023-03-18 DIAGNOSIS — N390 Urinary tract infection, site not specified: Secondary | ICD-10-CM

## 2023-03-18 DIAGNOSIS — I10 Essential (primary) hypertension: Secondary | ICD-10-CM

## 2023-03-18 MED ORDER — CYANOCOBALAMIN (VIT B-12) 1,000 MCG/ML INJECTION SOLUTION
1000.0000 ug | Freq: Once | INTRAMUSCULAR | Status: AC
Start: 2023-03-18 — End: 2023-03-18
  Administered 2023-03-18: 1000 ug via INTRAMUSCULAR

## 2023-03-18 MED ORDER — CIPROFLOXACIN 500 MG TABLET
500.0000 mg | ORAL_TABLET | Freq: Two times a day (BID) | ORAL | 0 refills | Status: DC
Start: 2023-03-18 — End: 2023-04-15

## 2023-03-18 MED ORDER — INSULIN GLARGINE (U-300) CONC. 300 UNIT/ML (3 ML) SUBCUTANEOUS PEN
20.0000 [IU] | PEN_INJECTOR | Freq: Every evening | SUBCUTANEOUS | 2 refills | Status: DC
Start: 2023-03-18 — End: 2023-07-09

## 2023-03-18 MED ORDER — DIAZEPAM 5 MG TABLET
5.0000 mg | ORAL_TABLET | Freq: Every day | ORAL | 2 refills | Status: DC
Start: 2023-03-18 — End: 2023-04-15

## 2023-03-18 NOTE — Nursing Note (Signed)
03/18/23 1000   Glucose  (Fingerstick)   Time Performed 1036   Glucose (Ref Range 74-106 mg/dl) 84   Initials KD,CMA

## 2023-03-18 NOTE — Nursing Note (Signed)
Patient is here for routine 1 month follow up for medication refills.

## 2023-03-18 NOTE — Progress Notes (Signed)
V         INTERNAL MEDICINE, BUILDING A  [address]  [place] [address]  Operated by [facility]  History and Physical     Name: Grace Hoffman MRN:  [id number]   Date: 03/18/2023 Age: 72 y.o.               Follow Up        Reason for Visit: Follow Up (Routine 1 month ) discussed blood sugar medications; urine symptoms    History of Present Illness  Grace Hoffman is a 72 y.o. female who is being seen today in the office for f/u concerning history of uncontrolled diabetes with last hemoglobin 8.0 which was up from previous A1c 7.3.  Currently patient is on metformin at 1000 mg twice daily along with glimepiride 4 mg twice daily Toujeo 15 units nightly  and recently added Trulicity weekly.  Noted she was unable to tolerate previously used Ozempic and Mounjaro due to GI side effects.  States Trulicity is making her sick to her stomach so she stopped it.  Reports that her blood sugar is dropping in the morning and prior to coming here 58.  She did drink some juice so recheck in the office currently 84.  We did discuss today time of frequency of medication being used noting that she is taking her Amaryl and metformin before bedtime.  Will encourage her to start these doses earlier in the day with dinner.  Patient is noted to be poorly compliant with her diet.    Patient also has recurrent urinary pressure pain with history of urinary tract infections noted.  States she has had little nausea but no vomiting fever chills requests urine checked today.    Follow-up blood pressure/hypertension currently 122/78.  She is currently taking Vasotec 10 mg daily as well as hydrochlorothiazide 25 mg daily p.r.n.Marland Kitchen   Denies chest pain.  She does continue to have chronic dizziness for which she is seeing the neurologist and has had a MRI of her brain which noted signs of TIAs.  She states she is currently doing physical therapy  for the dizziness and gait issues which seems to be helping.  Occasional headaches noted also.  She has a follow-up with Dr. Aretha Parrot scheduled.  Note patient also has been seen by the ENT where she had her crystals realigned.  She states this helped a little bit but she feels like her balance is still an issue.    Follow-up B12 deficiency with chronic fatigue issues noted.  Patient is requesting B12 injection today which do seem to help with her energy level.  Last B12 level when checked in lab stable however.    Follow-up chronic pain/DJD/fibromyalgia for which she has been on chronic pain therapy/narcotics for numerous years.  Patient had reported increased pain in her lower back with x-rays showing moderate DJD the MRI of spine ordered.  She did have an MRI was referred to [facility] neurosurgeon who she states she has an appointment on December 26 to obtain injections in her back area.  She also is currently taking Norco 7.5 mg q.6 hours p.r.n. which does help with her chronic pain issues.  She denies any side effects to medication and compliant w urine drug screen.  Requesting medication refill.    Follow-up anxiety/depression with patient currently on Valium 5 mg daily p.r.n. as well as Effexor 37.5 mg daily.  Patient denies panic attack and states anxiety depression overall stable at  this current time.    PHQ Questionnaire         Past Medical History:   Diagnosis Date    Anxiety     Chronic back pain     COVID-19 vaccine series completed     Depression     Diabetes mellitus, type 2 (CMS HCC)     Fibromyalgia affecting multiple sites     Hypertension     Hypomagnesemia     Insomnia disorder     Migraine     Mixed hyperlipidemia     Orthostatic hypotension     Osteoarthritis     TIA (transient ischemic attack)     Unspecified glaucoma(365.9)     Vitamin D deficiency          Past Surgical History:   Procedure Laterality Date    HX CHOLECYSTECTOMY      HX KNEE REPLACMENT Left     HX ROTATOR CUFF REPAIR Left      SKIN CANCER EXCISION      forehead removed      Family Medical History:       Problem Relation (Age of Onset)    Kidney failure Mother    No Known Problems Sister, Brother, Half-Sister, Half-Brother, Maternal Aunt, Maternal Uncle, Paternal Aunt, Paternal Uncle, Maternal Grandmother, Maternal Grandfather, Paternal Grandmother, Paternal Grandfather, Daughter, Son    Respiratory Problems Father            Social History     Tobacco Use    Smoking status: Never    Smokeless tobacco: Never   Vaping Use    Vaping status: Never Used   Substance Use Topics    Alcohol use: Never    Drug use: Never     Medication:  d-mannose 500 mg Oral Capsule, Take 1 Capsule (500 mg total) by mouth Twice daily  enalapril maleate (VASOTEC) 10 mg Oral Tablet, Take 1 Tablet (10 mg total) by mouth Once a day for 180 days  ergocalciferol, vitamin D2, (DRISDOL) 1,250 mcg (50,000 unit) Oral Capsule, Take 1 Capsule (50,000 Units total) by mouth Every 7 days  famotidine (PEPCID) 20 mg Oral Tablet, TAKE 1 TABLET BY MOUTH TWICE DAILY (Patient taking differently: Take 1 Tablet (20 mg total) by mouth Every evening)  glimepiride (AMARYL) 4 mg Oral Tablet, TAKE 1 TABLET(4 MG) BY MOUTH TWICE DAILY WITH A MEAL FOR DIABETES  latanoprost (XALATAN) 0.005 % Ophthalmic Drops, Administer 1 Drop into the left eye Every night  Levetiracetam 500 mg Oral Tablet Sustained Release 24 hr, Take 1 Tablet (500 mg total) by mouth Every night for 90 days  loratadine (CLARITIN) 10 mg Oral Tablet, Take 1 Tablet (10 mg total) by mouth Once per day as needed  magnesium oxide (MAG-OX) 400 mg Oral Tablet, Take 2 Tablets (800 mg total) by mouth Once a day  MetFORMIN (GLUCOPHAGE) 1,000 mg Oral Tablet, Take 1 Tablet (1,000 mg total) by mouth Twice daily with food  rosuvastatin (CRESTOR) 10 mg Oral Tablet, TAKE 1 TABLET BY MOUTH EVERY NIGHT AT BEDTIME  solifenacin (VESICARE) 5 mg Oral Tablet, TAKE 1 TABLET(5 MG) BY MOUTH EVERY DAY  SUMAtriptan (IMITREX) 50 mg Oral Tablet, Take 1  Tablet (50 mg total) by mouth Once, as needed for Migraine for up to 1 dose TAKE 1 TABLET BY MOUTH AT ONSET OF HEADACHE. IF NO RELIEF MAY REPEAT 1 AFTER AT LEAST 2 HOURS. MAX OF 4 TABS PER 24 HOURS  venlafaxine (EFFEXOR XR) 37.5 mg Oral Capsule, Sust. Release  24 hr, Take 1 Capsule (37.5 mg total) by mouth Every night  diazePAM (VALIUM) 5 mg Oral Tablet, Take 1 Tablet (5 mg total) by mouth Once a day  dulaglutide 0.75 mg/0.5 mL Subcutaneous Pen Injector, Inject 0.5 mL (0.75 mg total) under the skin Every 7 days  HYDROcodone-acetaminophen (NORCO) 7.5-325 mg Oral Tablet, Take 1 Tablet by mouth Every 6 hours as needed for up to 30 days Indications: pain  TOUJEO MAX U-300 SOLOSTAR 300 unit/mL (3 mL) Subcutaneous Insulin Pen, ADMINISTER 10 UNITS UNDER THE SKIN EVERY NIGHT    No facility-administered medications prior to visit.    Allergies:  Allergies   Allergen Reactions    Codeine Nausea/ Vomiting    Amoxicillin Diarrhea    Macrobid [Nitrofurantoin Monohyd/M-Cryst] Nausea/ Vomiting       Physical Exam:  Vitals:    03/18/23 1028   BP: 122/78   Pulse: 72   Temp: 36.2 C (97.1 F)   TempSrc: Tympanic   SpO2: 98%   Weight: 89.8 kg (198 lb)   Height: 1.676 m ( )   BMI: 32.02      Physical Exam  Vitals and nursing note reviewed.   Constitutional:       Appearance: Normal appearance. She is obese.   HENT:      Right Ear: Tympanic membrane normal.      Left Ear: Tympanic membrane normal.      Mouth/Throat:      Mouth: Mucous membranes are moist.      Pharynx: Oropharynx is clear.   Eyes:      Extraocular Movements: Extraocular movements intact.      Pupils: Pupils are equal, round, and reactive to light.   Cardiovascular:      Rate and Rhythm: Normal rate and regular rhythm.      Pulses: Normal pulses.   Pulmonary:      Effort: Pulmonary effort is normal.      Breath sounds: Normal breath sounds.   Abdominal:      General: Bowel sounds are normal.      Palpations: Abdomen is soft.      Tenderness: There is abdominal  tenderness (Suprapubic). There is no right CVA tenderness, left CVA tenderness or rebound.   Musculoskeletal:         General: No swelling or tenderness. Normal range of motion.      Cervical back: Normal range of motion and neck supple.      Comments: Arthritic changes hands and knees noted bilateral  Chronic trigger points upper back and thigh area  Chronic low back pain with range of motion  Negative straight leg raises  Gait stable   Skin:     General: Skin is warm.      Findings: No lesion or rash.   Neurological:      General: No focal deficit present.      Mental Status: She is alert and oriented to person, place, and time.      Sensory: No sensory deficit.      Motor: Weakness (Strength of upper and lower extremities decreased however equal bilateral) present.      Gait: Gait normal.   Psychiatric:         Mood and Affect: Mood normal.         Behavior: Behavior normal.         Thought Content: Thought content normal.         Judgment: Judgment normal.       Urine  Dip Results:   Time collected: 1059  Glucose (Ref Range: Negative mg/dL): Negative  Bilirubin (Ref Range: Negative mg/dL): Negative  Ketones (Ref Range: Negative mg/dL): Negative  Urine Specific Gravity (Ref Range: 1.005 - 1.030): 1.020  Blood (urine) (Ref Range: Negative mg/dL): Negative  pH (Ref Range: 5.0 - 8.0): 7.0  Protein (Ref Range: Negative mg/dL): Negative  Urobilinogen (Ref Range: Normal): 0.2mg /dL (Normal)  Nitrite (Ref Range: Negative): Negative  Leukocytes (Ref Range: Negative WBC's/uL): (!) Large         Assessment/Plan:  Problem List Items Addressed This Visit       Hypertension    Anxiety    Uncontrolled type 2 diabetes mellitus with hyperglycemia (CMS HCC) - Primary    Relevant Orders    POCT Blood Glucose/Fingerstick (AMB)    B12 deficiency    Recurrent UTI    Relevant Orders    POCT URINE DIPSTICK    URINALYSIS, MACROSCOPIC AND MICROSCOPIC W/CULTURE REFLEX (Completed)    Chronic back pain         Labs up-to-date  Urine sent for  culture  Cipro 500 mg bid x 10 days  Continue increase hydration/hygiene  Samples of jardiance 10 mg daily  Toujeo increase to 20 units daily  Will start taking Amaryl and Glucophage with  Dinner   encouraged 6 small decreased car meals daily  Encouraged to increase activity  Meds refilled as noted   B12 shot given  Continue blood pressure/blood sugar monitoring outpatient  Refuses colonoscopy  To follow-up with nephrologist/urologist /neurologist /neurosurgeon as scheduled   Follow-up pending lab results otherwise is scheduled    Post-Discharge Follow Up Appointments       Tuesday Apr 14, 2023    Lab with Lab, Nephrology Prn Mab Obetz at  2:00 PM      Wednesday Apr 15, 2023    Return Patient Visit with Eyvonne Mechanic, PA-C at 11:30 AM      Tuesday Apr 21, 2023    Return Patient Visit with Janyce Llanos, MD at  9:15 AM      Wednesday Apr 22, 2023    Return Telephone Visit with Eyvonne Mechanic, PA-C at  1:30 PM      Wednesday May 13, 2023    Return Patient Visit with Eyvonne Mechanic, PA-C at 11:30 AM      Wednesday Jun 10, 2023    Return Patient Visit with Eyvonne Mechanic, PA-C at  2:00 PM      Wednesday Jul 08, 2023    Return Patient Visit with Eyvonne Mechanic, PA-C at 10:30 AM      Wednesday Aug 05, 2023    Return Patient Visit with Eyvonne Mechanic, PA-C at 10:30 AM      Wednesday Sep 02, 2023    Return Patient Visit with Eyvonne Mechanic, PA-C at 10:30 AM      Friday Oct 09, 2023    Return Patient Visit with Arlana Hove, DO at  2:00 PM      Internal Medicine, Building A  [facility]  [address]  [place] [address]  [phone] Nephrology, [facility]  [facility] [place]  [address]  [place] [address]  [phone] Urology, [facility] [place], [place]  [address]  [place] [address]  [phone]              Seek medical attention for new or worsening symptoms.  Patient has been seen  in this clinic within the last 3 years.      Amy Hyman Bible, PA-C        I personally reviewed the documentation of care provided by the certified physician assistant and I agree with her medical decision making, Weyman Pedro, D.O.     This note was partially created using MModal Fluency Direct system (voice recognition software) and is inherently subject to errors including those of syntax and "sound-alike" substitutions which may escape proofreading.  In such instances, original meaning may be extrapolated by contextual derivation.

## 2023-03-18 NOTE — Telephone Encounter (Signed)
Patient is here for routine 1 month follow up for medication refills.

## 2023-03-18 NOTE — Nursing Note (Signed)
03/18/23 1000   Urine test  (Siemens Multistix 10 SG)   Performed Status: Automated   Time collected 1059   Color (Ref Range: Yellow) (!) Dark Yellow   Clarity (Ref Range: Clear) (!) Cloudy   Glucose (Ref Range: Negative mg/dL) Negative   Bilirubin (Ref Range: Negative mg/dL) Negative   Ketones (Ref Range: Negative mg/dL) Negative   Urine Specific Gravity (Ref Range: 1.005 - 1.030) 1.020   Blood (urine) (Ref Range: Negative mg/dL) Negative   pH (Ref Range: 5.0 - 8.0) 7.0   Protein (Ref Range: Negative mg/dL) Negative   Urobilinogen (Ref Range: Normal) 0.2mg /dL (Normal)   Nitrite (Ref Range: Negative) Negative   Leukocytes (Ref Range: Negative WBC's/uL) (!) 3+   Initials KD,CMA   Glucose  (Fingerstick)   Time Performed 1036   Glucose (Ref Range 74-106 mg/dl) 84   Initials KD,CMA

## 2023-03-19 MED ORDER — HYDROCODONE 7.5 MG-ACETAMINOPHEN 325 MG TABLET
1.0000 | ORAL_TABLET | Freq: Four times a day (QID) | ORAL | 0 refills | Status: DC | PRN
Start: 2023-03-19 — End: 2023-04-15

## 2023-03-25 ENCOUNTER — Other Ambulatory Visit (INDEPENDENT_AMBULATORY_CARE_PROVIDER_SITE_OTHER): Payer: Self-pay

## 2023-03-25 ENCOUNTER — Telehealth (INDEPENDENT_AMBULATORY_CARE_PROVIDER_SITE_OTHER): Payer: Self-pay | Admitting: Physician Assistant

## 2023-03-25 DIAGNOSIS — N390 Urinary tract infection, site not specified: Secondary | ICD-10-CM

## 2023-04-02 ENCOUNTER — Encounter (INDEPENDENT_AMBULATORY_CARE_PROVIDER_SITE_OTHER): Payer: Self-pay | Admitting: Nephrology

## 2023-04-09 ENCOUNTER — Ambulatory Visit (INDEPENDENT_AMBULATORY_CARE_PROVIDER_SITE_OTHER): Payer: Medicare Other | Admitting: Physician Assistant

## 2023-04-14 ENCOUNTER — Other Ambulatory Visit (INDEPENDENT_AMBULATORY_CARE_PROVIDER_SITE_OTHER): Payer: Medicare Other

## 2023-04-15 ENCOUNTER — Ambulatory Visit: Payer: Medicare Other | Attending: Physician Assistant | Admitting: Physician Assistant

## 2023-04-15 ENCOUNTER — Other Ambulatory Visit: Payer: Self-pay

## 2023-04-15 ENCOUNTER — Encounter (INDEPENDENT_AMBULATORY_CARE_PROVIDER_SITE_OTHER): Payer: Self-pay | Admitting: Physician Assistant

## 2023-04-15 ENCOUNTER — Other Ambulatory Visit (INDEPENDENT_AMBULATORY_CARE_PROVIDER_SITE_OTHER): Payer: Self-pay | Admitting: Physician Assistant

## 2023-04-15 VITALS — BP 118/78 | HR 82 | Ht 66.0 in | Wt 199.0 lb

## 2023-04-15 DIAGNOSIS — N182 Chronic kidney disease, stage 2 (mild): Secondary | ICD-10-CM | POA: Insufficient documentation

## 2023-04-15 DIAGNOSIS — E785 Hyperlipidemia, unspecified: Secondary | ICD-10-CM | POA: Insufficient documentation

## 2023-04-15 DIAGNOSIS — Z7984 Long term (current) use of oral hypoglycemic drugs: Secondary | ICD-10-CM

## 2023-04-15 DIAGNOSIS — E538 Deficiency of other specified B group vitamins: Secondary | ICD-10-CM | POA: Insufficient documentation

## 2023-04-15 DIAGNOSIS — F419 Anxiety disorder, unspecified: Secondary | ICD-10-CM | POA: Insufficient documentation

## 2023-04-15 DIAGNOSIS — Z794 Long term (current) use of insulin: Secondary | ICD-10-CM

## 2023-04-15 DIAGNOSIS — I129 Hypertensive chronic kidney disease with stage 1 through stage 4 chronic kidney disease, or unspecified chronic kidney disease: Secondary | ICD-10-CM

## 2023-04-15 DIAGNOSIS — E1165 Type 2 diabetes mellitus with hyperglycemia: Secondary | ICD-10-CM | POA: Insufficient documentation

## 2023-04-15 DIAGNOSIS — I10 Essential (primary) hypertension: Secondary | ICD-10-CM | POA: Insufficient documentation

## 2023-04-15 DIAGNOSIS — G894 Chronic pain syndrome: Secondary | ICD-10-CM | POA: Insufficient documentation

## 2023-04-15 DIAGNOSIS — R399 Unspecified symptoms and signs involving the genitourinary system: Secondary | ICD-10-CM | POA: Insufficient documentation

## 2023-04-15 DIAGNOSIS — R319 Hematuria, unspecified: Secondary | ICD-10-CM | POA: Insufficient documentation

## 2023-04-15 DIAGNOSIS — Z79899 Other long term (current) drug therapy: Secondary | ICD-10-CM | POA: Insufficient documentation

## 2023-04-15 LAB — COMPREHENSIVE METABOLIC PANEL, NON-FASTING
ALBUMIN/GLOBULIN RATIO: 1.3 (ref 0.8–1.4)
ALBUMIN: 4 g/dL (ref 3.5–5.7)
ALKALINE PHOSPHATASE: 79 U/L (ref 34–104)
ALT (SGPT): 14 U/L (ref 7–52)
ANION GAP: 6 mmol/L (ref 4–13)
AST (SGOT): 17 U/L (ref 13–39)
BILIRUBIN TOTAL: 0.4 mg/dL (ref 0.3–1.2)
BUN/CREA RATIO: 17 (ref 6–22)
BUN: 18 mg/dL (ref 7–25)
CALCIUM, CORRECTED: 9.6 mg/dL (ref 8.9–10.8)
CALCIUM: 9.6 mg/dL (ref 8.6–10.3)
CHLORIDE: 104 mmol/L (ref 98–107)
CO2 TOTAL: 28 mmol/L (ref 21–31)
CREATININE: 1.03 mg/dL (ref 0.60–1.30)
ESTIMATED GFR: 57 mL/min/{1.73_m2} — ABNORMAL LOW (ref >59)
GLOBULIN: 3.1 (ref 2.9–5.4)
GLUCOSE: 312 mg/dL — ABNORMAL HIGH (ref 74–109)
OSMOLALITY, CALCULATED: 290 mOsm/kg (ref 270–290)
POTASSIUM: 4.7 mmol/L (ref 3.5–5.1)
PROTEIN TOTAL: 7.1 g/dL (ref 6.4–8.9)
SODIUM: 138 mmol/L (ref 136–145)

## 2023-04-15 LAB — MAGNESIUM: MAGNESIUM: 1.8 mg/dL — ABNORMAL LOW (ref 1.9–2.7)

## 2023-04-15 LAB — HGA1C (HEMOGLOBIN A1C WITH EST AVG GLUCOSE): HEMOGLOBIN A1C: 8.3 % — ABNORMAL HIGH (ref 4.0–6.0)

## 2023-04-15 LAB — CBC
HCT: 41.6 % (ref 31.2–41.9)
HGB: 14 g/dL (ref 10.9–14.3)
MCH: 33.1 pg — ABNORMAL HIGH (ref 24.7–32.8)
MCHC: 33.6 g/dL (ref 32.3–35.6)
MCV: 98.6 fL — ABNORMAL HIGH (ref 75.5–95.3)
MPV: 10.6 fL (ref 7.9–10.8)
PLATELETS: 204 10*3/uL (ref 140–440)
RBC: 4.22 10*6/uL (ref 3.63–4.92)
RDW: 12.8 % (ref 12.3–17.7)
WBC: 7.1 10*3/uL (ref 3.8–11.8)

## 2023-04-15 LAB — URIC ACID: URIC ACID: 5.1 mg/dL (ref 2.3–7.6)

## 2023-04-15 LAB — LIPID PANEL
CHOL/HDL RATIO: 5.7
CHOLESTEROL: 240 mg/dL — ABNORMAL HIGH (ref <200)
HDL CHOL: 42 mg/dL (ref >=40)
LDL CALC: 150 mg/dL — ABNORMAL HIGH (ref 0–100)
TRIGLYCERIDES: 240 mg/dL — ABNORMAL HIGH (ref <=150)
VLDL CALC: 48 mg/dL (ref 0–50)

## 2023-04-15 LAB — PHOSPHORUS: PHOSPHORUS: 4.1 mg/dL (ref 3.7–7.2)

## 2023-04-15 LAB — THYROID STIMULATING HORMONE WITH FREE T4 REFLEX: TSH: 1.146 u[IU]/mL (ref 0.450–5.330)

## 2023-04-15 LAB — PARATHYROID HORMONE (PTH): PTH: 30.8 pg/mL (ref 12.0–88.0)

## 2023-04-15 MED ORDER — EMPAGLIFLOZIN 25 MG TABLET
25.0000 mg | ORAL_TABLET | Freq: Every day | ORAL | 1 refills | Status: DC
Start: 2023-04-15 — End: 2023-05-13

## 2023-04-15 MED ORDER — MOUNJARO 2.5 MG/0.5 ML SUBCUTANEOUS PEN INJECTOR
2.5000 mg | PEN_INJECTOR | SUBCUTANEOUS | 2 refills | Status: DC
Start: 2023-04-15 — End: 2023-06-10

## 2023-04-15 MED ORDER — CYANOCOBALAMIN (VIT B-12) 1,000 MCG/ML INJECTION SOLUTION
1000.0000 ug | Freq: Once | INTRAMUSCULAR | Status: AC
Start: 2023-04-15 — End: 2023-04-15
  Administered 2023-04-15: 1000 ug via INTRAMUSCULAR

## 2023-04-15 MED ORDER — FREESTYLE LIBRE 3 SENSOR DEVICE
1.0000 | 5 refills | Status: DC
Start: 2023-04-15 — End: 2023-04-21

## 2023-04-15 MED ORDER — HYDROCODONE 7.5 MG-ACETAMINOPHEN 325 MG TABLET
1.0000 | ORAL_TABLET | Freq: Four times a day (QID) | ORAL | 0 refills | Status: DC | PRN
Start: 2023-04-15 — End: 2023-05-13

## 2023-04-15 MED ORDER — DIAZEPAM 5 MG TABLET
5.0000 mg | ORAL_TABLET | Freq: Every day | ORAL | 2 refills | Status: DC
Start: 2023-04-15 — End: 2023-05-13

## 2023-04-15 NOTE — Nursing Note (Signed)
Patient is here for routine 1 month follow up for medication refills.

## 2023-04-15 NOTE — Telephone Encounter (Signed)
Patient is here for routine 1 month follow up for medication refills.

## 2023-04-15 NOTE — Nursing Note (Signed)
04/15/23 1200   Urine test  (Siemens Multistix 10 SG)   Performed Status: Automated   Time collected 1134   Color (Ref Range: Yellow) Yellow   Clarity (Ref Range: Clear) (!) Cloudy   Glucose (Ref Range: Negative mg/dL) (!) 2+ (478 mg/dl)   Bilirubin (Ref Range: Negative mg/dL) Negative   Ketones (Ref Range: Negative mg/dL) Negative   Urine Specific Gravity (Ref Range: 1.005 - 1.030) 1.010   Blood (urine) (Ref Range: Negative mg/dL) (!) Small (1+)   pH (Ref Range: 5.0 - 8.0) 5.5   Protein (Ref Range: Negative mg/dL) Negative   Urobilinogen (Ref Range: Normal) 0.2mg /dL (Normal)   Nitrite (Ref Range: Negative) Negative   Leukocytes (Ref Range: Negative WBC's/uL) Negative   Initials KD,CMA

## 2023-04-15 NOTE — Progress Notes (Signed)
INTERNAL MEDICINE, BUILDING A  [address]  [place] [address]  Operated by [facility]  History and Physical     Name: Grace Hoffman MRN:  [id number]   Date: 04/15/2023 Age: 72 y.o.               Follow Up        Reason for Visit: Follow Up (Routine )  urine complaints; BS up    History of Present Illness  Grace Hoffman is a 72 y.o. female who is being seen today in the office for f/u as well as complaints of urinary pressure/freq. She has some itching as well but no discharge. Pt has hx of UTI's noted so request urine check. Denies any abd pain fever chills.     Complaints also of elevated BS w history of uncontrolled diabetes. Pts  last hemoglobin  up to 9.1 previously 8.0. Currently patient is on metformin at 1000 mg twice daily along with glimepiride 4 mg twice daily  jardiance 10 mg daily and Toujeo 10 units nightly.  Pt has sliding scale coverage but has not been using it.  Noted she was unable to tolerate  Ozempic due to GI side effects.  States she will try the mounjaro if thought would help her BS.  Pt has hx of poor dietary compliance. F/u labs needed.    F/U blood pressure/hypertension currently 118/78.  She is currently taking Vasotec 10 mg daily as well as hydrochlorothiazide 25 mg daily p.r.n.Marland Kitchen   Denies chest pain.  She does continue to have chronic dizziness for which she is seeing the neurologist and has had a MRI of her brain which noted signs of TIAs.  She has also done physical therapy for the dizziness and gait issues. Occasional headaches noted also.  She has a follow-up with Dr. Aretha Parrot scheduled.  Note patient also has been seen by the ENT where she had her crystals realigned.  She states this helped a little bit but she feels like her balance is still an issue.  She also has hx of CKD and referred to nephrologist.    F/u chol/lipids w pt currently taking Crestor 20 mg q hs increased after last levels were elevated at chol 218 trig 293 HDL 39 LDL 120.. F/u  labs needed.    Follow-up B12 deficiency with chronic fatigue issues noted.  Patient is requesting B12 injection today which do seem to help with her energy level.    Hx of low mag also noted but denies leg cramps.    Follow-up chronic pain/DJD/fibromyalgia for which she has been on chronic pain therapy/narcotics for numerous years.  Patient had reported increased pain in her lower back with x-rays showing moderate DJD the MRI of spine ordered.  She did have an MRI was referred to [facility] neurosurgeon who she states she has an appointment on December 26 to obtain injections in her back area.  She also is currently taking Norco 7.5 mg q.6 hours p.r.n. which does help with her chronic pain issues.  She denies any side effects to medication and compliant w urine drug screen.  Requesting medication refill.    Follow-up anxiety/depression with patient currently on Valium 5 mg daily p.r.n. as well as Effexor 37.5 mg daily.  Patient denies panic attack and states anxiety depression overall stable at this current time.    PHQ Questionnaire         Past Medical History:   Diagnosis Date    Anxiety  Chronic back pain     COVID-19 vaccine series completed     Depression     Diabetes mellitus, type 2 (CMS HCC)     Fibromyalgia affecting multiple sites     Hypertension     Hypomagnesemia     Insomnia disorder     Migraine     Mixed hyperlipidemia     Orthostatic hypotension     Osteoarthritis     TIA (transient ischemic attack)     Unspecified glaucoma(365.9)     Vitamin D deficiency          Past Surgical History:   Procedure Laterality Date    HX CHOLECYSTECTOMY      HX KNEE REPLACMENT Left     HX ROTATOR CUFF REPAIR Left     SKIN CANCER EXCISION      forehead removed      Family Medical History:       Problem Relation (Age of Onset)    Kidney failure Mother    No Known Problems Sister, Brother, Half-Sister, Half-Brother, Maternal Aunt, Maternal Uncle, Paternal Aunt, Paternal Uncle, Maternal Grandmother, Maternal  Grandfather, Paternal Grandmother, Paternal Grandfather, Daughter, Son    Respiratory Problems Father            Social History     Tobacco Use    Smoking status: Never    Smokeless tobacco: Never   Vaping Use    Vaping status: Never Used   Substance Use Topics    Alcohol use: Never    Drug use: Never     Medication:  d-mannose 500 mg Oral Capsule, Take 1 Capsule (500 mg total) by mouth Twice daily  ergocalciferol, vitamin D2, (DRISDOL) 1,250 mcg (50,000 unit) Oral Capsule, Take 1 Capsule (50,000 Units total) by mouth Every 7 days  famotidine (PEPCID) 20 mg Oral Tablet, TAKE 1 TABLET BY MOUTH TWICE DAILY (Patient taking differently: Take 1 Tablet (20 mg total) by mouth Every evening)  glimepiride (AMARYL) 4 mg Oral Tablet, TAKE 1 TABLET(4 MG) BY MOUTH TWICE DAILY WITH A MEAL FOR DIABETES  insulin glargine U-300 conc (TOUJEO MAX U-300 SOLOSTAR) 300 unit/mL (3 mL) Subcutaneous Insulin Pen, Inject 20 Units under the skin Every night  latanoprost (XALATAN) 0.005 % Ophthalmic Drops, Administer 1 Drop into the left eye Every night  Levetiracetam 500 mg Oral Tablet Sustained Release 24 hr, Take 1 Tablet (500 mg total) by mouth Every night for 90 days  loratadine (CLARITIN) 10 mg Oral Tablet, Take 1 Tablet (10 mg total) by mouth Once per day as needed  magnesium oxide (MAG-OX) 400 mg Oral Tablet, Take 2 Tablets (800 mg total) by mouth Once a day  MetFORMIN (GLUCOPHAGE) 1,000 mg Oral Tablet, Take 1 Tablet (1,000 mg total) by mouth Twice daily with food  rosuvastatin (CRESTOR) 10 mg Oral Tablet, TAKE 1 TABLET BY MOUTH EVERY NIGHT AT BEDTIME  venlafaxine (EFFEXOR XR) 37.5 mg Oral Capsule, Sust. Release 24 hr, Take 1 Capsule (37.5 mg total) by mouth Every night  ciprofloxacin HCl (CIPRO) 500 mg Oral Tablet, Take 1 Tablet (500 mg total) by mouth Twice daily  diazePAM (VALIUM) 5 mg Oral Tablet, Take 1 Tablet (5 mg total) by mouth Once a day  enalapril maleate (VASOTEC) 10 mg Oral Tablet, Take 1 Tablet (10 mg total) by mouth Once  a day for 180 days  HYDROcodone-acetaminophen (NORCO) 7.5-325 mg Oral Tablet, Take 1 Tablet by mouth Every 6 hours as needed for up to 30 days Indications: pain  solifenacin (  VESICARE) 5 mg Oral Tablet, TAKE 1 TABLET(5 MG) BY MOUTH EVERY DAY  SUMAtriptan (IMITREX) 50 mg Oral Tablet, Take 1 Tablet (50 mg total) by mouth Once, as needed for Migraine for up to 1 dose TAKE 1 TABLET BY MOUTH AT ONSET OF HEADACHE. IF NO RELIEF MAY REPEAT 1 AFTER AT LEAST 2 HOURS. MAX OF 4 TABS PER 24 HOURS    No facility-administered medications prior to visit.    Allergies:  Allergies   Allergen Reactions    Codeine Nausea/ Vomiting    Amoxicillin Diarrhea    Macrobid [Nitrofurantoin Monohyd/M-Cryst] Nausea/ Vomiting       Physical Exam:  Vitals:    04/15/23 1143   BP: 118/78   Pulse: 82   SpO2: 94%   Weight: 90.3 kg (199 lb)   Height: 1.676 m (5\' 6" )   BMI: 32.19      Physical Exam  Vitals and nursing note reviewed.   Constitutional:       Appearance: Normal appearance. She is obese.   HENT:      Right Ear: Tympanic membrane normal.      Left Ear: Tympanic membrane normal.      Mouth/Throat:      Mouth: Mucous membranes are moist.      Pharynx: Oropharynx is clear.   Eyes:      Extraocular Movements: Extraocular movements intact.      Pupils: Pupils are equal, round, and reactive to light.   Cardiovascular:      Rate and Rhythm: Normal rate and regular rhythm.      Pulses: Normal pulses.   Pulmonary:      Effort: Pulmonary effort is normal.      Breath sounds: Normal breath sounds.   Abdominal:      General: Bowel sounds are normal.      Palpations: Abdomen is soft.      Tenderness: There is abdominal tenderness (suprapubic). There is no right CVA tenderness, left CVA tenderness or rebound.   Musculoskeletal:         General: No swelling or tenderness. Normal range of motion.      Cervical back: Normal range of motion and neck supple.      Comments: Arthritic changes hands and knees noted bilateral  Chronic trigger points upper back and  thigh area  Chronic low back pain with range of motion  Negative straight leg raises  Gait stable   Skin:     General: Skin is warm.      Findings: No lesion or rash.   Neurological:      General: No focal deficit present.      Mental Status: She is alert and oriented to person, place, and time.      Sensory: No sensory deficit.      Motor: Weakness (Strength of upper and lower extremities decreased however equal bilateral- chronic) present.      Gait: Gait normal.   Psychiatric:         Mood and Affect: Mood normal.         Behavior: Behavior normal.         Thought Content: Thought content normal.         Judgment: Judgment normal.       Urine Dip Results:   Time collected: 1134  Glucose (Ref Range: Negative mg/dL): (!) 2+ (161 mg/dl)  Bilirubin (Ref Range: Negative mg/dL): Negative  Ketones (Ref Range: Negative mg/dL): Negative  Urine Specific Gravity (Ref Range: 1.005 -  1.030): 1.010  Blood (urine) (Ref Range: Negative mg/dL): (!) Small (1+)  pH (Ref Range: 5.0 - 8.0): 5.5  Protein (Ref Range: Negative mg/dL): Negative  Urobilinogen (Ref Range: Normal): 0.2mg /dL (Normal)  Nitrite (Ref Range: Negative): Negative  Leukocytes (Ref Range: Negative WBC's/uL): Negative         Assessment/Plan:  Problem List Items Addressed This Visit       Hypertension    Relevant Orders    POCT URINE DIPSTICK    LIPID PANEL (Completed)    HGA1C (HEMOGLOBIN A1C WITH EST AVG GLUCOSE) (Completed)    THYROID STIMULATING HORMONE WITH FREE T4 REFLEX (Completed)    MAGNESIUM (Completed)    DRUG SCREEN, NO CONFIRMATION, URINE    Anxiety    Uncontrolled type 2 diabetes mellitus with hyperglycemia (CMS HCC)    Relevant Orders    POCT URINE DIPSTICK    LIPID PANEL (Completed)    HGA1C (HEMOGLOBIN A1C WITH EST AVG GLUCOSE) (Completed)    THYROID STIMULATING HORMONE WITH FREE T4 REFLEX (Completed)    MAGNESIUM (Completed)    DRUG SCREEN, NO CONFIRMATION, URINE    Hypomagnesemia    Relevant Orders    POCT URINE DIPSTICK    LIPID PANEL (Completed)     HGA1C (HEMOGLOBIN A1C WITH EST AVG GLUCOSE) (Completed)    THYROID STIMULATING HORMONE WITH FREE T4 REFLEX (Completed)    MAGNESIUM (Completed)    DRUG SCREEN, NO CONFIRMATION, URINE    Long-term use of high-risk medication    Relevant Orders    POCT URINE DIPSTICK    LIPID PANEL (Completed)    HGA1C (HEMOGLOBIN A1C WITH EST AVG GLUCOSE) (Completed)    THYROID STIMULATING HORMONE WITH FREE T4 REFLEX (Completed)    MAGNESIUM (Completed)    DRUG SCREEN, NO CONFIRMATION, URINE    Chronic pain    Relevant Orders    POCT URINE DIPSTICK    LIPID PANEL (Completed)    HGA1C (HEMOGLOBIN A1C WITH EST AVG GLUCOSE) (Completed)    THYROID STIMULATING HORMONE WITH FREE T4 REFLEX (Completed)    MAGNESIUM (Completed)    DRUG SCREEN, NO CONFIRMATION, URINE    B12 deficiency    Relevant Orders    POCT URINE DIPSTICK    LIPID PANEL (Completed)    HGA1C (HEMOGLOBIN A1C WITH EST AVG GLUCOSE) (Completed)    THYROID STIMULATING HORMONE WITH FREE T4 REFLEX (Completed)    MAGNESIUM (Completed)    DRUG SCREEN, NO CONFIRMATION, URINE    Chronic kidney disease (CKD), stage II (mild)    Hyperlipidemia    Relevant Orders    POCT URINE DIPSTICK    LIPID PANEL (Completed)    HGA1C (HEMOGLOBIN A1C WITH EST AVG GLUCOSE) (Completed)    THYROID STIMULATING HORMONE WITH FREE T4 REFLEX (Completed)    MAGNESIUM (Completed)    DRUG SCREEN, NO CONFIRMATION, URINE     Other Visit Diagnoses       Urinary symptom or sign    -  Primary    Relevant Orders    POCT URINE DIPSTICK    LIPID PANEL (Completed)    HGA1C (HEMOGLOBIN A1C WITH EST AVG GLUCOSE) (Completed)    THYROID STIMULATING HORMONE WITH FREE T4 REFLEX (Completed)    MAGNESIUM (Completed)    DRUG SCREEN, NO CONFIRMATION, URINE    Hematuria, unspecified type                  Labs obtained in office  Urine sent for culture  Continue increase hydration/hygiene  Increase  jardiance 25 mg daily  Increase toujeo 20 units nightly  Start mounjaro 2.5mg  weekly  Meds refilled as noted   B12 shot  given  Continue blood pressure/blood sugar monitoring outpatient  Refuses colonoscopy  To follow-up with nephrologist/urologist /neurologist/ neurosurgeon as scheduled   Follow-up pending lab results otherwise as noted    Post-Discharge Follow Up Appointments       Wednesday Jun 10, 2023    Return Patient Visit with Eyvonne Mechanic, PA-C at  2:00 PM      Wednesday Jul 08, 2023    Return Patient Visit with Eyvonne Mechanic, PA-C at 10:30 AM      Friday Jul 31, 2023    Lab with Lab, Nephrology Prn [place] [place] at  [facility] PM      Wednesday Aug 05, 2023    Return Patient Visit with Eyvonne Mechanic, PA-C at 10:30 AM      Friday Aug 07, 2023    Return Patient Visit with Cydney Ok, NP at  1:00 PM      Wednesday Sep 02, 2023    Return Patient Visit with Eyvonne Mechanic, PA-C at 10:30 AM      Friday Oct 09, 2023    Return Patient Visit with Arlana Hove, DO at  2:00 PM      Monday Apr 25, 2024    Return Telephone Visit with Eyvonne Mechanic, PA-C at  1:30 PM      Internal Medicine, Building A  Building [facility]  [address]  [place] [address]  [phone] Nephrology, [facility]  [facility], [place]  [address]  [place] [address]  [phone] Urology, [facility] [place], [place]  [address]  [place] [address]  [phone]              Seek medical attention for new or worsening symptoms.  Patient has been seen in this clinic within the last 3 years.     Amy Hyman Bible, PA-C      I personally reviewed the documentation of care provided by the certified physician assistant and I agree with her medical decision making, Weyman Pedro, D.O.       This note was partially created using MModal Fluency Direct system (voice recognition software) and is inherently subject to errors including those of syntax and "sound-alike" substitutions which may escape proofreading.  In such instances, original meaning may be extrapolated by contextual  derivation.

## 2023-04-17 ENCOUNTER — Telehealth (INDEPENDENT_AMBULATORY_CARE_PROVIDER_SITE_OTHER): Payer: Self-pay | Admitting: Physician Assistant

## 2023-04-17 NOTE — Telephone Encounter (Signed)
-----   Message from Eyvonne Mechanic, [place] sent at 04/17/2023  9:02 AM EDT -----  Increase Crestor to 20 mg tell to watch diet closely  Continue mag and increase mag in diet

## 2023-04-18 LAB — MICROALBUMIN URINE, RANDOM: MICROALBUMIN RANDOM URINE: <3

## 2023-04-18 LAB — CREATININE URINE, RANDOM: CREATININE, UR RAND: 44.5

## 2023-04-20 ENCOUNTER — Other Ambulatory Visit (INDEPENDENT_AMBULATORY_CARE_PROVIDER_SITE_OTHER): Payer: Self-pay | Admitting: Physician Assistant

## 2023-04-20 ENCOUNTER — Other Ambulatory Visit (INDEPENDENT_AMBULATORY_CARE_PROVIDER_SITE_OTHER): Payer: Self-pay

## 2023-04-20 DIAGNOSIS — R319 Hematuria, unspecified: Secondary | ICD-10-CM

## 2023-04-20 DIAGNOSIS — Z79899 Other long term (current) drug therapy: Secondary | ICD-10-CM

## 2023-04-20 DIAGNOSIS — R399 Unspecified symptoms and signs involving the genitourinary system: Secondary | ICD-10-CM

## 2023-04-20 DIAGNOSIS — E1165 Type 2 diabetes mellitus with hyperglycemia: Secondary | ICD-10-CM

## 2023-04-21 ENCOUNTER — Ambulatory Visit (INDEPENDENT_AMBULATORY_CARE_PROVIDER_SITE_OTHER): Payer: Medicare Other

## 2023-04-21 ENCOUNTER — Telehealth (INDEPENDENT_AMBULATORY_CARE_PROVIDER_SITE_OTHER): Payer: Self-pay | Admitting: Physician Assistant

## 2023-04-21 ENCOUNTER — Other Ambulatory Visit: Payer: Self-pay

## 2023-04-21 ENCOUNTER — Encounter (INDEPENDENT_AMBULATORY_CARE_PROVIDER_SITE_OTHER): Payer: Self-pay

## 2023-04-21 VITALS — BP 139/81 | HR 84 | Ht 66.0 in | Wt 196.2 lb

## 2023-04-21 DIAGNOSIS — N281 Cyst of kidney, acquired: Secondary | ICD-10-CM

## 2023-04-21 DIAGNOSIS — E1122 Type 2 diabetes mellitus with diabetic chronic kidney disease: Secondary | ICD-10-CM

## 2023-04-21 DIAGNOSIS — E559 Vitamin D deficiency, unspecified: Secondary | ICD-10-CM

## 2023-04-21 DIAGNOSIS — N183 Chronic kidney disease, stage 3 unspecified (CMS HCC): Secondary | ICD-10-CM

## 2023-04-21 DIAGNOSIS — I10 Essential (primary) hypertension: Secondary | ICD-10-CM

## 2023-04-21 DIAGNOSIS — E119 Type 2 diabetes mellitus without complications: Secondary | ICD-10-CM

## 2023-04-21 DIAGNOSIS — I129 Hypertensive chronic kidney disease with stage 1 through stage 4 chronic kidney disease, or unspecified chronic kidney disease: Secondary | ICD-10-CM

## 2023-04-21 NOTE — Telephone Encounter (Signed)
-----   Message from Eyvonne Mechanic, [place] sent at 04/20/2023  2:51 PM EDT -----  Lab good

## 2023-04-21 NOTE — Progress Notes (Signed)
NEPHROLOGY, [facility] BUILDING  [address] [place] [address]    Progress Note    Name: Grace Hoffman MRN:  [id number]   Date: 04/21/2023 DOB:  22-Mar-1951 (72 y.o.)              Nephrology Out  Patient Visit       HPI : 72 y.o.  female Chronic Kidney Disease Stage IIIa here for a follow up labs. Past medical History of Hypertension, diabetes mellitus type 2 non-insulin-dependent, and recurrent urinary tract infections.  Denies complaints of nausea and vomiting.  Reports no problems with edema.  Denies current NSAID use.  States blood pressure has been around 130s over 80s.  Hemoglobin A1c is 8.3.  Denies hematuria and dysuria.    ROS:      Assessment  was negative except what mentioned in in the HPI.     OBJECTIVE:   BP 139/81   Pulse 84   Ht 1.676 m (5\' 6" )   Wt 89 kg (196 lb 3.4 oz)   BMI 31.67 kg/m       General:  NAD, AAOx3  HEENT:  EOMI,  no icterus  NECK: No increased JVD.  Normal inspection   HEART: Normal S1 and S2. Regular rhythm. No murmurs or rubs. No chest wall tenderness   LUNGS: Clear to auscultation bilateral. No wheezes, rales, or rhonchi.   ABDOMEN: +BS, Soft, nontender and nondistended. No rebound or guarding present.   EXTREMITIES: No edema. No cyanosis or clubbing.No asterixis    NEURO : Normal speech, EOMI.       LABORATORY DATA:   Lab Results   Component Value Date    BUN 18 04/15/2023    BUN 15 02/18/2023    BUN 24 12/10/2022    CREATININE 1.03 04/15/2023    CREATININE 0.92 02/18/2023    CREATININE 1.06 12/10/2022    BUNCRRATIO 17 04/15/2023    BUNCRRATIO 16 02/18/2023    BUNCRRATIO 23 (H) 12/10/2022    GFR 57 (L) 04/15/2023    GFR 65 02/18/2023    GFR 55 (L) 12/10/2022    SODIUM 138 04/15/2023    SODIUM 136 02/18/2023    SODIUM 137 12/10/2022    POTASSIUM 4.7 04/15/2023    POTASSIUM 5.1 02/18/2023    POTASSIUM 4.5 12/10/2022    CHLORIDE 104 04/15/2023    CHLORIDE 105 02/18/2023    CHLORIDE 107 12/10/2022    CO2 28 04/15/2023    CO2 25 02/18/2023    CO2 25 12/10/2022     ANIONGAP 6 04/15/2023    ANIONGAP 6 02/18/2023    ANIONGAP 5 12/10/2022    CALCIUM 9.6 04/15/2023    CALCIUM 9.6 02/18/2023    CALCIUM 8.5 (L) 12/10/2022    PHOSPHORUS 4.1 04/15/2023    ALBUMIN 4.0 04/15/2023    ALBUMIN 3.7 02/18/2023    ALBUMIN 3.2 (L) 12/10/2022    HGB 14.0 04/15/2023    HGB 13.0 02/18/2023    HGB 11.7 12/10/2022    HCT 41.6 04/15/2023    HCT 39.0 02/18/2023    HCT 34.9 12/10/2022    INTACTPTH 30.8 04/15/2023    INTACTPTH 44.2 07/16/2022    IRON 122 11/03/2022    IRONBINDCAP 374 11/03/2022    IRONSAT 33 11/03/2022           MEDICATIONS:  Outpatient Medications Marked as Taking for the 04/21/23 encounter (Office Visit) with Cydney Ok, NP   Medication Sig    d-mannose 500 mg  Oral Capsule Take 1 Capsule (500 mg total) by mouth Twice daily    diazePAM (VALIUM) 5 mg Oral Tablet Take 1 Tablet (5 mg total) by mouth Once a day    ergocalciferol, vitamin D2, (DRISDOL) 1,250 mcg (50,000 unit) Oral Capsule Take 1 Capsule (50,000 Units total) by mouth Every 7 days    famotidine (PEPCID) 20 mg Oral Tablet TAKE 1 TABLET BY MOUTH TWICE DAILY (Patient taking differently: Take 1 Tablet (20 mg total) by mouth Every evening)    glimepiride (AMARYL) 4 mg Oral Tablet TAKE 1 TABLET(4 MG) BY MOUTH TWICE DAILY WITH A MEAL FOR DIABETES    HYDROcodone-acetaminophen (NORCO) 7.5-325 mg Oral Tablet Take 1 Tablet by mouth Every 6 hours as needed for up to 30 days Indications: pain    insulin glargine U-300 conc (TOUJEO MAX U-300 SOLOSTAR) 300 unit/mL (3 mL) Subcutaneous Insulin Pen Inject 20 Units under the skin Every night    latanoprost (XALATAN) 0.005 % Ophthalmic Drops Administer 1 Drop into the left eye Every night    Levetiracetam 500 mg Oral Tablet Sustained Release 24 hr Take 1 Tablet (500 mg total) by mouth Every night for 90 days    magnesium oxide (MAG-OX) 400 mg Oral Tablet Take 2 Tablets (800 mg total) by mouth Once a day    MetFORMIN (GLUCOPHAGE) 1,000 mg Oral Tablet Take 1 Tablet (1,000 mg total) by mouth  Twice daily with food    rosuvastatin (CRESTOR) 10 mg Oral Tablet TAKE 1 TABLET BY MOUTH EVERY NIGHT AT BEDTIME    solifenacin (VESICARE) 5 mg Oral Tablet TAKE 1 TABLET(5 MG) BY MOUTH EVERY DAY    SUMAtriptan (IMITREX) 50 mg Oral Tablet Take 1 Tablet (50 mg total) by mouth Once, as needed for Migraine for up to 1 dose TAKE 1 TABLET BY MOUTH AT ONSET OF HEADACHE. IF NO RELIEF MAY REPEAT 1 AFTER AT LEAST 2 HOURS. MAX OF 4 TABS PER 24 HOURS    venlafaxine (EFFEXOR XR) 37.5 mg Oral Capsule, Sust. Release 24 hr Take 1 Capsule (37.5 mg total) by mouth Every night     Current Outpatient Medications   Medication Instructions    d-mannose 500 mg Oral Capsule 1 Capsule, Oral, 2 TIMES DAILY    diazePAM (VALIUM) 5 mg, Oral, DAILY    empagliflozin (JARDIANCE) 25 mg, Oral, DAILY    enalapril maleate (VASOTEC) 10 mg, Oral, DAILY    ergocalciferol (vitamin D2) (DRISDOL) 50,000 Units, Oral, EVERY 7 DAYS    famotidine (PEPCID) 20 mg Oral Tablet TAKE 1 TABLET BY MOUTH TWICE DAILY    glimepiride (AMARYL) 4 mg Oral Tablet TAKE 1 TABLET(4 MG) BY MOUTH TWICE DAILY WITH A MEAL FOR DIABETES    HYDROcodone-acetaminophen (NORCO) 7.5-325 mg Oral Tablet 1 Tablet, Oral, EVERY 6 HOURS PRN    insulin glargine U-300 conc (TOUJEO MAX U-300 SOLOSTAR) 20 Units, Subcutaneous, NIGHTLY    latanoprost (XALATAN) 0.005 % Ophthalmic Drops 1 Drop, Left Eye, NIGHTLY    Levetiracetam 500 mg, Oral, NIGHTLY    loratadine (CLARITIN) 10 mg, Oral, DAILY PRN    magnesium oxide (MAG-OX) 800 mg, Oral, DAILY    MetFORMIN (GLUCOPHAGE) 1,000 mg, Oral, 2 TIMES DAILY WITH FOOD    Mounjaro 2.5 mg, Subcutaneous, EVERY 7 DAYS    rosuvastatin (CRESTOR) 10 mg Oral Tablet TAKE 1 TABLET BY MOUTH EVERY NIGHT AT BEDTIME    solifenacin (VESICARE) 5 mg Oral Tablet TAKE 1 TABLET(5 MG) BY MOUTH EVERY DAY    SUMAtriptan (IMITREX) 50 mg,  Oral, ONCE PRN, TAKE 1 TABLET BY MOUTH AT ONSET OF HEADACHE. IF NO RELIEF MAY REPEAT 1 AFTER AT LEAST 2 HOURS. MAX OF 4 TABS PER 24 HOURS    venlafaxine  (EFFEXOR XR) 37.5 mg, Oral, NIGHTLY       ASSESSMENT / PLAN:   ENCOUNTER DIAGNOSES     ICD-10-CM   1. CKD (chronic kidney disease) stage 3, GFR 30-59 ml/min (CMS HCC)  N18.30   2. DM (diabetes mellitus) (CMS HCC)  E11.9   3. Hypertension, unspecified type  I10   4. Vitamin D deficiency  E55.9   5. Hypomagnesemia  E83.42            Chronic kidney disease  -Stage IIIb  -Due to diabetes and hypertension  -Creatinine is stable    1.03/GFR 57  -Baseline creatinine 1.04  -Total protein to creatinine ratio no significant proteinuria  -CBC and a basic metabolic panel 1.0  -Return to clinic in 6 months  -Continue low-sodium diet  -balanced Fluid intake 40-50 oz a day.    -Avoid NSAIDs.  Tylenol only for pain  -Renal imaging: no hydronephrosis, Simple right renal cyst  -ACEI or ARBS:  yes  -continue enalapril 10 mg daily  -Sodium Glucose Cotransporter-2 (SGLT2) Inhibitors: Yes  -Continue Jardiance 25 mg daily          -  Hypertension  -Blood pressure is acceptable  -Goal less than 130/80  -Continue enalapril 10 mg daily  -Low-salt diet        Diabetes type 2  -A1c goal is less than 7   Lab Results   Component Value Date    HA1C 8.3 (H) 04/15/2023      -Continue Jardiance 25 mg daily.  -Diabetic diet  -Increase activity and exercise as possible  -Monitor A1C      Hypomagnesemia  -continue Magnesium supplements   -Monitor Magnesium level    Vitamin D Deficiency  -Monitor Vitamin D Level      I have discussed with the patient the following issues:  The main goal of treatment is to prevent progression of CKD to complete kidney failure. The best way to do this is to control the underlying cause.  the optimal diet is one similar to the Dietary Approaches to Stop Hypertension (DASH) diet, consisting of fruits, vegetables, legumes, fish, poultry, and whole grains.  Restrict sodium intake to less than 2 g/day  Education provided on diabetic diet blood sugar control.    Orders Placed This Encounter    BASIC METABOLIC PANEL    CBC/DIFF     MAGNESIUM    MICROALBUMIN/CREATININE RATIO, URINE, RANDOM    PARATHYROID HORMONE (PTH)    URIC ACID    PROTEIN/CREATININE RATIO, URINE, RANDOM    VITAMIN D 25 TOTAL       Cydney Ok, NP

## 2023-04-22 ENCOUNTER — Ambulatory Visit: Payer: Medicare Other | Attending: Physician Assistant | Admitting: Physician Assistant

## 2023-04-22 DIAGNOSIS — I10 Essential (primary) hypertension: Secondary | ICD-10-CM

## 2023-04-22 DIAGNOSIS — E1165 Type 2 diabetes mellitus with hyperglycemia: Secondary | ICD-10-CM

## 2023-04-22 DIAGNOSIS — E559 Vitamin D deficiency, unspecified: Secondary | ICD-10-CM

## 2023-04-22 DIAGNOSIS — F411 Generalized anxiety disorder: Secondary | ICD-10-CM

## 2023-04-22 DIAGNOSIS — I951 Orthostatic hypotension: Secondary | ICD-10-CM

## 2023-04-22 DIAGNOSIS — H409 Unspecified glaucoma: Secondary | ICD-10-CM

## 2023-04-22 DIAGNOSIS — G4709 Other insomnia: Secondary | ICD-10-CM

## 2023-04-22 DIAGNOSIS — E782 Mixed hyperlipidemia: Secondary | ICD-10-CM

## 2023-04-22 DIAGNOSIS — G43009 Migraine without aura, not intractable, without status migrainosus: Secondary | ICD-10-CM

## 2023-04-22 DIAGNOSIS — M1990 Unspecified osteoarthritis, unspecified site: Secondary | ICD-10-CM

## 2023-04-22 DIAGNOSIS — Z8673 Personal history of transient ischemic attack (TIA), and cerebral infarction without residual deficits: Secondary | ICD-10-CM

## 2023-04-22 DIAGNOSIS — Z0000 Encounter for general adult medical examination without abnormal findings: Secondary | ICD-10-CM

## 2023-04-22 DIAGNOSIS — F32A Depression, unspecified: Secondary | ICD-10-CM

## 2023-04-22 DIAGNOSIS — M797 Fibromyalgia: Secondary | ICD-10-CM

## 2023-04-22 NOTE — Progress Notes (Signed)
INTERNAL MEDICINE, BUILDING A  [address]  [place] [address]  Operated by [facility]  Medicare Annual Wellness Visit    Name: Grace Hoffman MRN:  [id number]   Date: 04/22/2023 Age: 72 y.o.       SUBJECTIVE:   Grace Hoffman is a 72 y.o. female for presenting for Medicare Wellness exam.   I have reviewed and reconciled the medication list with the patient today.    TELEMEDICINE DOCUMENTATION:    [facility]   Patient/family aware of provider location:  yes  Patient/family consent for telemedicine:  yes  Examination observed and performed by:  Eyvonne Mechanic, PA-C         04/07/2022     2:54 PM 04/22/2023     3:50 PM   Comprehensive Health Assessment-Adult   Do you wish to complete this form? Yes Yes   During the past 4 weeks, how would you rate your health in general? Fair Fair   During the past 4 weeks, how much difficulty have you had doing your usual activities inside and outside your home because of medical or emotional problems? A little bit of difficulty A little bit of difficulty   During the past 4 weeks, was someone available to help you if you needed and wanted help? Yes, as much as I wanted Yes, as much as I wanted   In the past year, how many times have you gone to the emergency department or been admitted to a hospital for a health problem? None 1 time   Are you generally satisfied with your sleep? Yes Yes   Do you have enough money to buy things you need in everyday life, such as food, clothing, medicines, and housing? Yes, always No   Can you get to places beyond walking distance without help?  (For example, can you drive your own car or travel alone on buses)? Yes Yes   Do you fasten your seatbelt when you are in a car? Yes, usually Yes, usually   Do you exercise 20 minutes 3 or more days per week (such as walking, dancing, biking, mowing grass, swimming)? Yes, most of the time Yes, most of the time   How often do you eat food that is healthy (fruits, vegetables,  lean meats) instead of unhealthy (sweets, fast food, junk food, fatty foods)? Most of the time Almost always   Have your parents, brothers or sisters had any of the following problems before the age of 81? (check all that apply)  Cancer   How often do you have trouble taking medicines the eay you are told to take them? I always take them as prescribed I always take them as prescribed   Do you need any help communicating with your doctors and nurses because of vision or hearing problems? No No   During the past 12 months, have you experienced confusion or memory loss that is happening more often or is getting worse? No No   Do you have one person you think of as your personal doctor (primary care provider or family doctor)?  Yes   If you are seeing a Primary Care Provider (PCP) or family doctor. please list their name Meredeth Furber Pa-C Armend Hochstatter   Are you now also seeing any specialist physician(s) (such as eye doctor, foot doctor, skin doctor)? No Yes   If you are seeing a specialist for anything such as foot, eye, skin, etc.  please list their name(s)  foot dr   How confident are you that you can control or manage most of your health problems? Very confident Very confident         I have reviewed and updated as appropriate the past medical, family and social history. 04/22/2023 as summarized below:  Past Medical History:   Diagnosis Date    Anxiety     Chronic back pain     COVID-19 vaccine series completed     Depression     Diabetes mellitus, type 2 (CMS HCC)     Fibromyalgia affecting multiple sites     Hypertension     Hypomagnesemia     Insomnia disorder     Migraine     Mixed hyperlipidemia     Orthostatic hypotension     Osteoarthritis     TIA (transient ischemic attack)     Unspecified glaucoma(365.9)     Vitamin D deficiency      Past Surgical History:   Procedure Laterality Date    Hx cholecystectomy      Hx knee replacment Left     Hx rotator cuff repair Left     Skin cancer excision       Current Outpatient  Medications   Medication Sig    d-mannose 500 mg Oral Capsule Take 1 Capsule (500 mg total) by mouth Twice daily    diazePAM (VALIUM) 5 mg Oral Tablet Take 1 Tablet (5 mg total) by mouth Once a day    empagliflozin (JARDIANCE) 25 mg Oral Tablet Take 1 Tablet (25 mg total) by mouth Once a day    enalapril maleate (VASOTEC) 10 mg Oral Tablet Take 1 Tablet (10 mg total) by mouth Once a day for 180 days    ergocalciferol, vitamin D2, (DRISDOL) 1,250 mcg (50,000 unit) Oral Capsule Take 1 Capsule (50,000 Units total) by mouth Every 7 days    famotidine (PEPCID) 20 mg Oral Tablet TAKE 1 TABLET BY MOUTH TWICE DAILY (Patient taking differently: Take 1 Tablet (20 mg total) by mouth Every evening)    glimepiride (AMARYL) 4 mg Oral Tablet TAKE 1 TABLET(4 MG) BY MOUTH TWICE DAILY WITH A MEAL FOR DIABETES    HYDROcodone-acetaminophen (NORCO) 7.5-325 mg Oral Tablet Take 1 Tablet by mouth Every 6 hours as needed for up to 30 days Indications: pain    insulin glargine U-300 conc (TOUJEO MAX U-300 SOLOSTAR) 300 unit/mL (3 mL) Subcutaneous Insulin Pen Inject 20 Units under the skin Every night    latanoprost (XALATAN) 0.005 % Ophthalmic Drops Administer 1 Drop into the left eye Every night    Levetiracetam 500 mg Oral Tablet Sustained Release 24 hr Take 1 Tablet (500 mg total) by mouth Every night for 90 days    loratadine (CLARITIN) 10 mg Oral Tablet Take 1 Tablet (10 mg total) by mouth Once per day as needed    magnesium oxide (MAG-OX) 400 mg Oral Tablet Take 2 Tablets (800 mg total) by mouth Once a day    MetFORMIN (GLUCOPHAGE) 1,000 mg Oral Tablet Take 1 Tablet (1,000 mg total) by mouth Twice daily with food    rosuvastatin (CRESTOR) 10 mg Oral Tablet TAKE 1 TABLET BY MOUTH EVERY NIGHT AT BEDTIME    solifenacin (VESICARE) 5 mg Oral Tablet TAKE 1 TABLET(5 MG) BY MOUTH EVERY DAY    SUMAtriptan (IMITREX) 50 mg Oral Tablet Take 1 Tablet (50 mg total) by mouth Once, as needed for Migraine for up to 1 dose TAKE 1 TABLET BY MOUTH AT  ONSET OF HEADACHE. IF NO RELIEF MAY REPEAT 1 AFTER AT LEAST 2 HOURS. MAX OF 4 TABS PER 24 HOURS    tirzepatide (MOUNJARO) 2.5 mg/0.5 mL Subcutaneous Pen Injector Inject 0.5 mL (2.5 mg total) under the skin Every 7 days for 30 days    venlafaxine (EFFEXOR XR) 37.5 mg Oral Capsule, Sust. Release 24 hr Take 1 Capsule (37.5 mg total) by mouth Every night     Family Medical History:       Problem Relation (Age of Onset)    Kidney failure Mother    No Known Problems Sister, Brother, Half-Sister, Half-Brother, Maternal Aunt, Maternal Uncle, Paternal Aunt, Paternal Uncle, Maternal Grandmother, Maternal Grandfather, Paternal Grandmother, Paternal Grandfather, Daughter, Son    Respiratory Problems Father            Social History     Socioeconomic History    Marital status: Married    Number of children: 1    Years of education: 11   Tobacco Use    Smoking status: Never    Smokeless tobacco: Never   Vaping Use    Vaping status: Never Used   Substance and Sexual Activity    Alcohol use: Never    Drug use: Never    Sexual activity: Not Currently     Partners: Male     Social Determinants of Health     Social Connections: Low Risk  (12/06/2022)    Social Connections     SDOH Social Isolation: 5 or more times a week   Health Literacy: Medium Risk (12/06/2022)    Health Literacy     SDOH Health Literacy: Occasionally         List of Current Health Care Providers   Care Team       PCP       Name Type Specialty Phone Number    Eyvonne Mechanic, [place] Physician Assistant PHYSICIAN ASSISTANT [phone]              Care Team       No care team found                      Health Maintenance   Topic Date Due    Medicare Annual Wellness Visit  04/08/2023    Diabetic A1C  07/15/2023    Diabetic Retinal Exam  11/05/2023    Diabetic Kidney Health eGFR  04/14/2024    Diabetic Kidney Health Microalb/Cr Ratio  04/14/2024    Adult Tdap-Td (2 - Td or Tdap) 08/21/2029    Hepatitis C screening  Completed    Influenza Vaccine  Completed    Shingles  Vaccine  Completed    Pneumococcal Vaccination, Age 73+  Completed    Meningococcal Vaccine  Aged Out    Osteoporosis screening  Discontinued    Mammography  Discontinued    Colonoscopy  Discontinued    Covid-19 Vaccine  Discontinued     Medicare Wellness Assessment   Medicare initial or wellness physical in the last year?: Yes  Advance Directives   Does patient have a living will or MPOA: No                  Activities of Daily Living   Do you need help with dressing, bathing, or walking?: No   Do you need help with shopping, housekeeping, medications, or finances?: No   Do you have rugs in hallways, broken steps, or poor lighting?: No   Do you have  grab bars in your bathroom, non-slip strips in your tub, and hand rails on your stairs?: Yes   Cognitive Function Screen (1=Yes, 0=No)   What is you age?: Correct   What is the time to the nearest hour?: Correct   What is the year?: Correct   What is the name of this clinic?: Correct   Can the patient recognize two persons (the doctor, the nurse, home help, etc.)?: Correct   What is the date of your birth? (day and month sufficient) : Correct   In what year did World War II end?: Incorrect   Who is the current president of the [place]?: Correct   Count from 20 down to 1?: Correct   What address did I give you earlier?: Incorrect   Total Score: 8   Interpretation of Total Score: Greater than 6 Normal   Fall Risk Screen   Do you feel unsteady when standing or walking?: Yes  Do you worry about falling?: No  Have you fallen in the past year?: No   Depression Screen     Little interest or pleasure in doing things.: Several Days  Feeling down, depressed, or hopeless: Not at all  PHQ 2 Total: 1     Pain Score   Pain Score:   5    Substance Use-Abuse Screening     Tobacco Use     In Past 12 MONTHS, how often have you used any tobacco product (for example, cigarettes, e-cigarettes, cigars, pipes, or smokeless tobacco)?: Never     Alcohol use     In the PAST 12 MONTHS, how  often have you had 5 (men)/4 (women) or more drinks containing alcohol in one day?: Never     Prescription Drug Use     In the PAST 12 months, how often have you used any prescription medications just for the feeling, more than prescribed, or that were not prescribed for you? Prescriptions may include: opioids, benzodiazepines, medications for ADHD: Never           Illicit Drug Use   In the PAST 12 MONTHS, how often have you used any drugs, including marijuana, cocaine or crack, heroin, methamphetamine, hallucinogens, ecstasy/MDMA?: Never            Urine Incontinence Screen   Urinary Incontinence Screen  Do you ever leak urine when you don't want to?: YES                     OBJECTIVE:   There were no vitals taken for this visit.       Other appropriate exam:    Health Maintenance Due   Topic Date Due    Medicare Annual Wellness Visit  04/08/2023      ASSESSMENT & PLAN:  Problem List Items Addressed This Visit    None       Identified Risk Factors/ Recommended Actions     Fall Risk Follow up plan of care: Discussed optimizing home safety    Urinary Incontinence Plan of Care: Behavioral interventions with bladder training, pelvic floor muscle training, and/or prompted voiding  Opioid use plan of care:         Opioids use: Plan: Assessment of pain is completed. Advised to follow up with opioid prescribing provider.  Pain stable at this time w current meds        No orders of the defined types were placed in this encounter.         The  patient has been educated about risk factors and recommended preventive care. Written Prevention Plan completed/ updated and given to patient (see After Visit Summary).    Total time on visit 30 mins    Post-Discharge Follow Up Appointments       Wednesday May 13, 2023    Return Patient Visit with Eyvonne Mechanic, PA-C at 11:30 AM      Wednesday Jun 10, 2023    Return Patient Visit with Eyvonne Mechanic, PA-C at  2:00 PM      Wednesday Jul 08, 2023    Return Patient Visit with Eyvonne Mechanic, PA-C at  10:30 AM      Friday Jul 31, 2023    Lab with Lab, Nephrology Prn Mab Sonterra at  [facility] PM      Wednesday Aug 05, 2023    Return Patient Visit with Eyvonne Mechanic, PA-C at 10:30 AM      Friday Aug 07, 2023    Return Patient Visit with Cydney Ok, NP at  1:00 PM      Wednesday Sep 02, 2023    Return Patient Visit with Eyvonne Mechanic, PA-C at 10:30 AM      Friday Oct 09, 2023    Return Patient Visit with Arlana Hove, DO at  2:00 PM      Monday Apr 25, 2024    Return Telephone Visit with Eyvonne Mechanic, PA-C at  1:30 PM      Internal Medicine, Building A  Building [facility], [place]  [address]  [place] [address]  [phone] Nephrology, [facility]  [facility] [place]  [address]  [place] [address]  [phone] Urology, [facility] [place], [place]  [address]  [place] [address]  [phone]           Medicare Preventive Services  Medicare coverage information Recommendation for YOU   Heart Disease and Diabetes   Lipid profile Every 5 years or more often if at risk for cardiovascular disease  Last Lipid Panel  (Last result in the past 2 years)        Cholesterol   HDL   LDL   Direct LDL   Triglycerides      04/15/23 1247 240   42   150     240             Diabetes Screening  yearly for those at risk for diabetes, 2 tests per year for those with prediabetes Last Glucose: 312    Diabetes Self Management Training or Medical Nutrition Therapy  For those with diabetes, up to 10 hrs initial training within a year, subsequent years up to 2 hrs of follow up training Optional for those with diabetes     Medical Nutrition Therapy Three hours of one-on-one counseling in first year, two hours in subsequent years Optional for those with diabetes, kidney disease   Intensive Behavioral Therapy for Obesity  Face-to-face counseling, first month every week, month 2-6 every other week, month 7-12 every month if continued progress is  documented Optional for those with Body Mass Index 30 or higher  Your There is no height or weight on file to calculate BMI.   Tobacco Cessation (Quitting) Counseling   Two attempts per year, max 4 sessions per attempt, up to 8 per year, for those with tobacco-related health condition Optional for those that use tobacco   Cancer Screening   Colorectal screening  For anyone age 43 to 8 or any age if high risk:  Screening Colonoscopy every 10 yrs if low risk,  more frequent if higher risk  OR  Cologuard Stool DNA test once every 3 years OR  Fecal Occult Blood Testing yearly OR  Flexible  Sigmoidoscopy  every 5 yr OR  CT Colonography every 5 yrs    See your schedule below   Screening Pap Test Recommended every 3 years for all women age 36 to 19, or every five years if combined with HPV test (routine screening not needed after total hysterectomy).  Medicare covers every 2 years, up to yearly if high risk.  Screening Pelvic Exam Medicare covers every 2 years, yearly if high risk or childbearing age with abnormal Pap in last 3 yrs. See your schedule below   Screening Mammogram   Recommended every 2 years for women age 49 to 47, or more frequent if you have a higher risk. Selectively recommended for women between 40-49 based on shared decisions about risk. Covered by Medicare up to every year for women age 74 or older See your schedule below      Lung Cancer Screening  Annual low dose computed tomography (LDCT scan) is recommended for those age 32-77 who smoked 20 pack-years and are current smokers or quit smoking within past 15 years (one pack-year= smoking one PPD for one year), after counseling by your doctor or nurse clinician about the possible benefits or harms. See your schedule below   Vaccinations   Pneumococcal Vaccine: Recommended routinely age 74+ with one or two separate vaccines based on your risk    Recommended before age 4 if medical conditions with increased risk  Seasonal Influenza Vaccine: Once every  flu season   Hepatitis B Vaccine: 3 doses if risk (including anyone with diabetes or liver disease)  Shingles Vaccine: Two doses at age 106 or older  Diphtheria Tetanus Pertussis Vaccine: ONCE as adult, booster every 10 years     Immunization History   Administered Date(s) Administered    Covid-19 Vaccine,Pfizer-BioNTech,Purple Top,33yrs+ 02/17/2020, 03/09/2020, 03/09/2020, 02/21/2021    FLUZONE HD VACCINE (ADMIN) 09/16/2016, 09/27/2017    High-Dose Influenza Vaccine, 65+ 10/14/2020    Influenza Vaccine, 6 month-adult 10/10/2013, 09/20/2021    PNEUMOCOCCAL CONJ VACCINES 05/10/2004    PREVNAR 13 03/09/2017    Shingrix - Zoster Vaccine 02/27/2017, 07/20/2017    Td (Adult) 5 Lf Tetanus Toxoid, Preservative Free, Absorbed 08/22/2019    Tetanus Toxoid/Diphtheria Toxoid/Acellular Pertussis Vaccine, Adsorbed 08/22/2019     Shingles vaccine and Diphtheria Tetanus Pertussis vaccines are available at pharmacies or local health department without a prescription.   Other Screening   Bone Densitometry   Every 24 months for anyone at risk, including postmenopausal       Glaucoma Screening   Yearly if in high risk group such as diabetes, family history, African American age 86+ or Hispanic American age 74+   See your Eye Care Provider   Hepatitis C Screening recommended ONCE for those born between 1945-1965, or high risk for HCV infection       HIV Testing recommended routinely at least ONCE, covered every year for age 34 to 74 regardless of risk, and every year for age over 41 who ask for the test or higher risk  Yearly or up to 3 times in pregnancy         Abdominal Aortic Aneurysm Screening Ultrasound   Once between the age of 73-75 with a family history of AAA  Your Personalized Schedule for Preventive Tests     Health Maintenance: Pending and Last Completed         Date Due Completion Date    Medicare Annual Wellness Visit 04/08/2023 04/07/2022    Diabetic A1C 07/15/2023 04/15/2023    Diabetic Retinal Exam 11/05/2023  11/04/2022    Override on 12/24/2021: Previously completed    Diabetic Kidney Health eGFR 04/14/2024 04/15/2023    Diabetic Kidney Health Microalb/Cr Ratio 04/14/2024 04/15/2023    Adult Tdap-Td (2 - Td or Tdap) 08/21/2029 08/22/2019               Non-Opioid Treatment for Chronic Pains     Treatment for chronic pain can be managed without opioids. Below are non-opioid options that may be considered and discussed with your provider to determine which options would be best for your health.    Over the counter or presciptions medications:  Acetaminophen (Tylenol) or Non-steroidal anti-inflammatories such as: Ibuprofen (Motrin, Advil), naproxen (Aleve), aspirin  Antidepressants such as amitriptyline, nortriptyline (Pamelor),  Doxepin (Silenor), Imipramine (Tofranil) and others.  Anticonvulsant Nerve pain medications: Gabapentin (Neurontin), pregabalin (Lyrica)  Externally applied medications such as NSAID'S, lidocaine, capsaisin, and others  Injections: pain specialists can sometimes inject medications at the site of pain.    Alternative therapies such as  Acupuncture  Osteopathic manipulation  Chiropractic  Massage therapy             Sou Nohr B. Xylia Scherger PA-C

## 2023-04-22 NOTE — Nursing Note (Deleted)
Patient is completing medicare annual today

## 2023-04-22 NOTE — Nursing Note (Signed)
Patient completed medicare annual today

## 2023-05-06 IMAGING — DX XRAY SHOULDER MINIMUM 2 VIEW LT
1 series · 2 of 2 positions shown · non-contrast
Comparison: Examination dated 06/19/2021.

﻿EXAM:  [id number]   XRAY SHOULDER MINIMUM 2 VIEW LT
INDICATION: Left shoulder pain.  History of rotator cuff surgery.
TECHNIQUE: Two views.

[Series 1: apobl · 0.14mm/px · 2 of 2 slices shown]
[im 1/2]
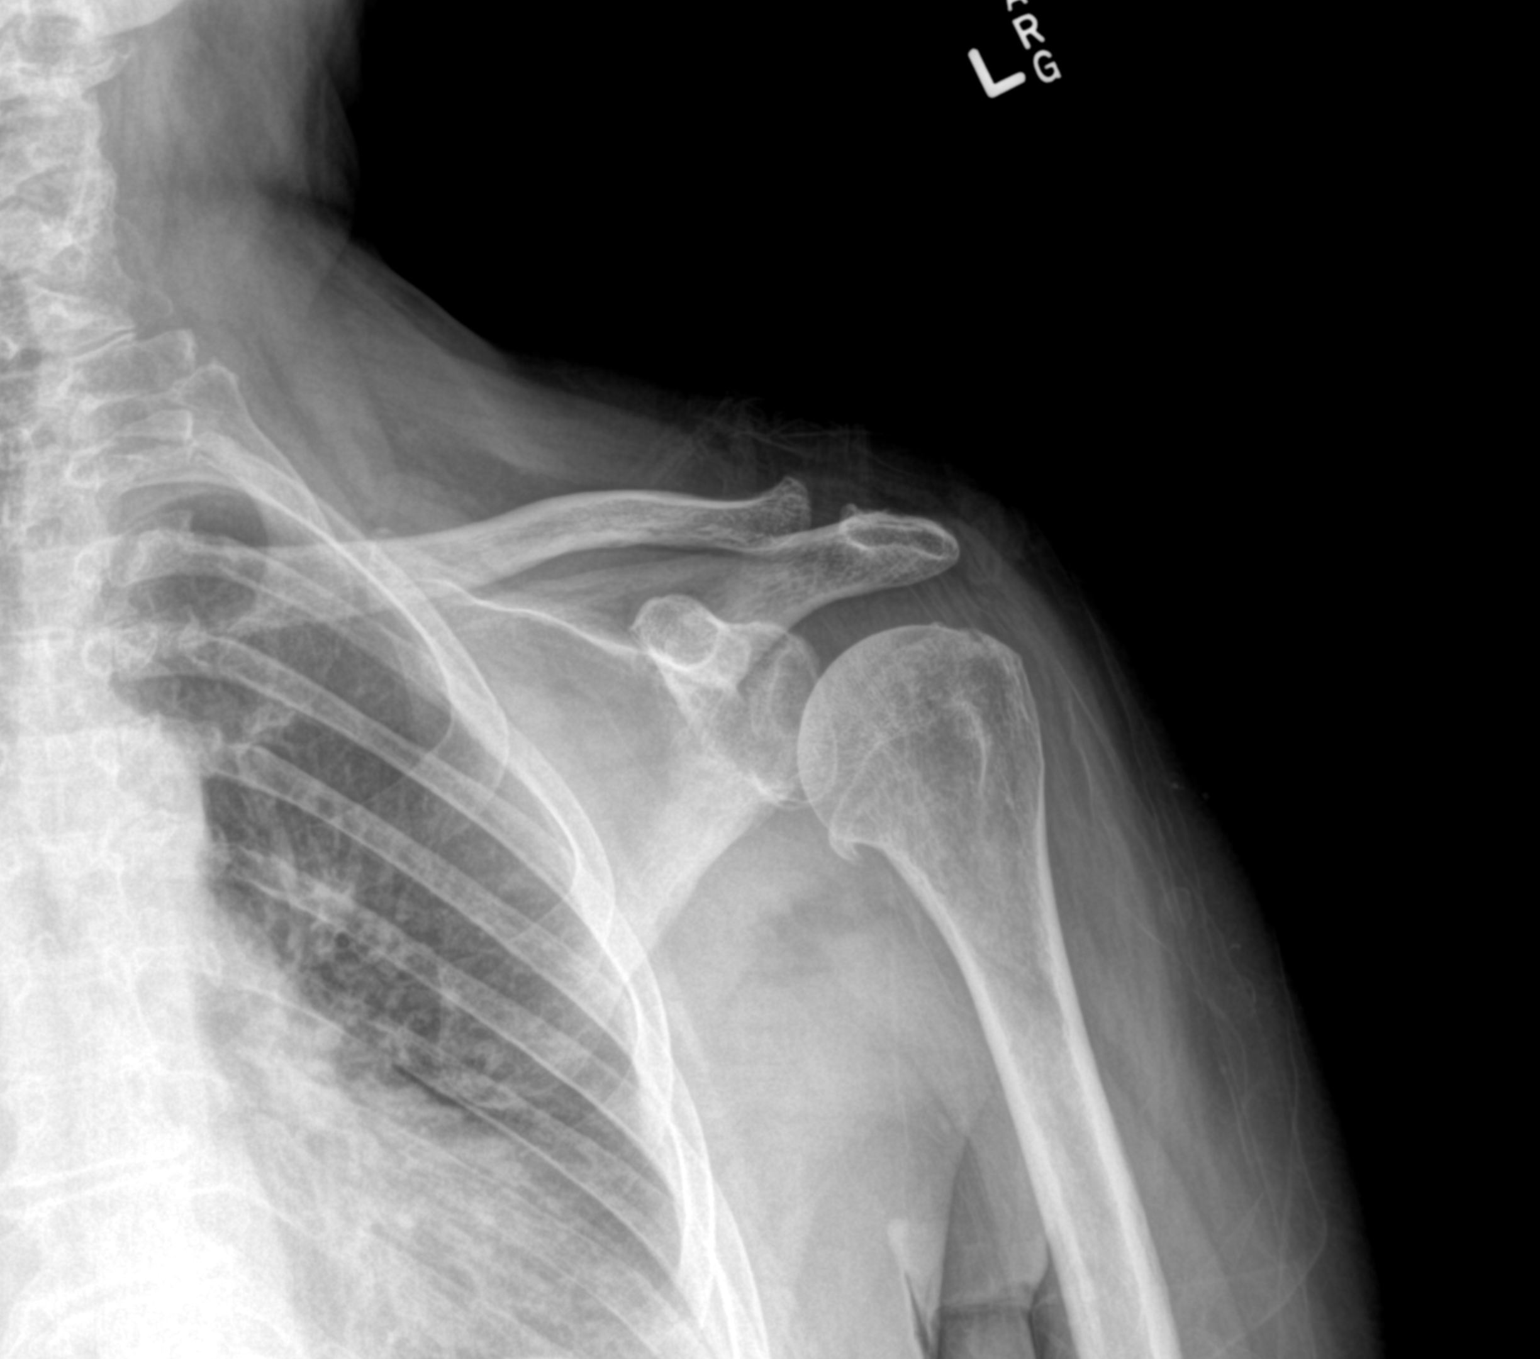
[im 2/2]
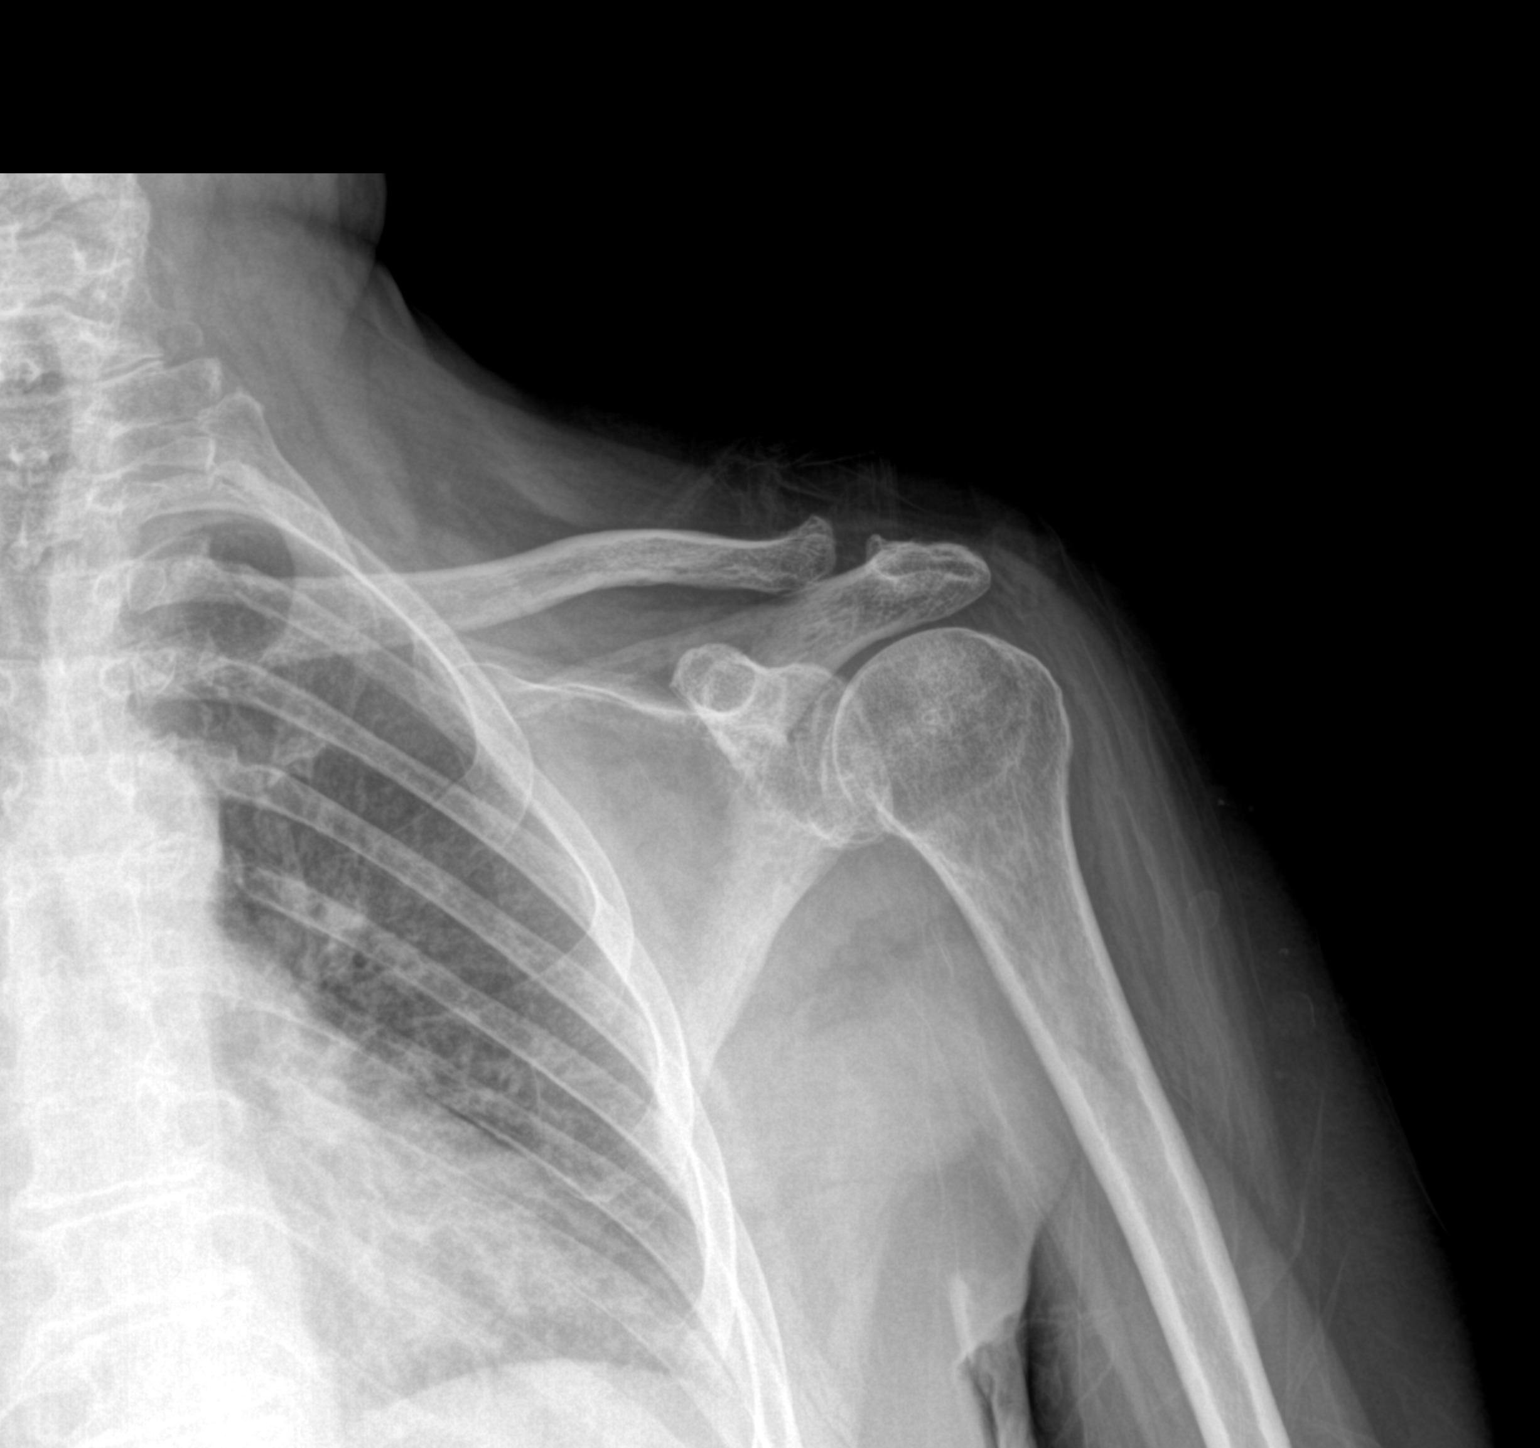

[2 of 2 positions shown; findings below may reference images not displayed]

FINDINGS: No acute bony lesions. Moderate degenerative arthritis of glenohumeral joint.  Soft tissues are unremarkable.
IMPRESSION: Moderate degenerative arthritis of the glenohumeral joint.

## 2023-05-13 ENCOUNTER — Other Ambulatory Visit (INDEPENDENT_AMBULATORY_CARE_PROVIDER_SITE_OTHER): Payer: Self-pay | Admitting: Physician Assistant

## 2023-05-13 ENCOUNTER — Other Ambulatory Visit: Payer: Self-pay

## 2023-05-13 ENCOUNTER — Ambulatory Visit: Payer: Medicare Other | Attending: Physician Assistant | Admitting: Physician Assistant

## 2023-05-13 ENCOUNTER — Encounter (INDEPENDENT_AMBULATORY_CARE_PROVIDER_SITE_OTHER): Payer: Self-pay | Admitting: Physician Assistant

## 2023-05-13 VITALS — BP 104/60 | HR 72 | Ht 66.0 in

## 2023-05-13 DIAGNOSIS — F419 Anxiety disorder, unspecified: Secondary | ICD-10-CM | POA: Insufficient documentation

## 2023-05-13 DIAGNOSIS — G894 Chronic pain syndrome: Secondary | ICD-10-CM | POA: Insufficient documentation

## 2023-05-13 DIAGNOSIS — E1165 Type 2 diabetes mellitus with hyperglycemia: Secondary | ICD-10-CM | POA: Insufficient documentation

## 2023-05-13 DIAGNOSIS — G8929 Other chronic pain: Secondary | ICD-10-CM | POA: Insufficient documentation

## 2023-05-13 DIAGNOSIS — I10 Essential (primary) hypertension: Secondary | ICD-10-CM

## 2023-05-13 DIAGNOSIS — M545 Low back pain, unspecified: Secondary | ICD-10-CM | POA: Insufficient documentation

## 2023-05-13 DIAGNOSIS — F32A Depression, unspecified: Secondary | ICD-10-CM | POA: Insufficient documentation

## 2023-05-13 DIAGNOSIS — M7918 Myalgia, other site: Secondary | ICD-10-CM | POA: Insufficient documentation

## 2023-05-13 DIAGNOSIS — M47816 Spondylosis without myelopathy or radiculopathy, lumbar region: Secondary | ICD-10-CM | POA: Insufficient documentation

## 2023-05-13 DIAGNOSIS — N182 Chronic kidney disease, stage 2 (mild): Secondary | ICD-10-CM | POA: Insufficient documentation

## 2023-05-13 DIAGNOSIS — E538 Deficiency of other specified B group vitamins: Secondary | ICD-10-CM | POA: Insufficient documentation

## 2023-05-13 MED ORDER — FREESTYLE LIBRE 2 SENSOR KIT
1.0000 | PACK | 3 refills | Status: DC
Start: 2023-05-13 — End: 2023-07-29

## 2023-05-13 MED ORDER — SOLIFENACIN 5 MG TABLET
5.0000 mg | ORAL_TABLET | Freq: Every day | ORAL | 1 refills | Status: DC
Start: 2023-05-13 — End: 2024-02-24

## 2023-05-13 MED ORDER — ENALAPRIL MALEATE 10 MG TABLET
10.0000 mg | ORAL_TABLET | Freq: Every day | ORAL | 1 refills | Status: DC
Start: 2023-05-13 — End: 2024-02-24

## 2023-05-13 MED ORDER — FREESTYLE LIBRE 2 READER
1.0000 | 0 refills | Status: DC
Start: 2023-05-13 — End: 2023-07-29

## 2023-05-13 MED ORDER — METHYLPREDNISOLONE ACETATE 80 MG/ML SUSPENSION FOR INJECTION
80.0000 mg | INTRAMUSCULAR | Status: AC
Start: 2023-05-13 — End: 2023-05-13
  Administered 2023-05-13: 80 mg via INTRAMUSCULAR

## 2023-05-13 MED ORDER — INSULIN LISPRO (U-100) 100 UNIT/ML SUBCUTANEOUS PEN
PEN_INJECTOR | SUBCUTANEOUS | 2 refills | Status: DC
Start: 2023-05-13 — End: 2023-07-09

## 2023-05-13 MED ORDER — EMPAGLIFLOZIN 25 MG TABLET
25.0000 mg | ORAL_TABLET | Freq: Every day | ORAL | 1 refills | Status: AC
Start: 2023-05-13 — End: ?

## 2023-05-13 MED ORDER — SUMATRIPTAN 50 MG TABLET
50.0000 mg | ORAL_TABLET | Freq: Once | ORAL | 2 refills | Status: DC | PRN
Start: 2023-05-13 — End: 2024-01-27

## 2023-05-13 MED ORDER — DIAZEPAM 5 MG TABLET
5.0000 mg | ORAL_TABLET | Freq: Every day | ORAL | 2 refills | Status: DC
Start: 2023-05-13 — End: 2023-06-10

## 2023-05-13 NOTE — Progress Notes (Signed)
INTERNAL MEDICINE, BUILDING A  [address]  [place] [address]  Operated by [facility]  History and Physical     Name: Grace Hoffman MRN:  [id number]   Date: 05/13/2023 Age: 72 y.o.               Follow Up        Reason for Visit: Follow Up (Routine 1 month ) med refills arms hurting    History of Present Illness  Grace Hoffman is a 72 y.o. female who is being seen today in the office for f/u as well as complaints of bilateral arm pain/muscles aching times 2-3 days.  Patient states last night when she was getting in the bath the pain seems to be increase in her arms even felt a little swollen.  She denies any new activities or injury.  Once again she does have fibromyalgia so questions whether could be related.  This morning both humeral area sore to touch and she feels like there is even "ridges" on them when she palpates.  Patient has used steroids in the past which she states really helps her muscle aches and pains in his requesting injection today.  She does think that the last injection she had was over 3 months ago and given to her into her back for her chronic back issues.    Follow-up  blood pressure/hypertension currently 104/60.  She is currently taking Vasotec 10 mg daily as well as hydrochlorothiazide 25 mg daily p.r.n..   Currently denies chest pain but She does continue to have chronic dizziness for which she is seeing the neurologist and has had a MRI of her brain noting signs of TIAs.  States she was just his office last week and no new workup or changes in medications were noted.  She was ordered physical therapy for the dizziness and gait issues which seemed to help but lately she has had some increase dizzy spells.  Occasional headaches noted also.  Denies syncope.  Note patient also has been seen by the ENT where she had her crystals realigned.  She states this helped a little bit but she feels like her balance is still an issue.    F/U BS w history of  uncontrolled diabetes with last hemoglobin 8.3 which was down from her previous 9.1.  Currently patient is on metformin at 1000 mg twice daily along with glimepiride 4 mg twice daily Toujeo 20 units nightly and recently added Jardiance 10 mg daily.  We did discuss increasing the Jardiance on last visit to 25 mg and prescription was sent the patient states she never filled it.  She is still taking the samples of the 10 mg I had given her as well as the nephrologist.  Patient does have a history of poor compliance with her diet.  Noted she was unable to tolerate being injectables such as Ozempic and Mounjaro due to GI side effects.  States blood sugar is a little bit better overall and  Denies increased thirst or urination.  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  She has orders for follow-up labs prior to her next visit in July.    Follow-up B12 deficiency with chronic fatigue issues noted.  Patient has received B12 injections in the past when here but states lately she has really not seen a difference so does not want 1 today. Last B12 level when checked in lab stable however.  Follow-up chronic pain/DJD/fibromyalgia for which she has been on chronic pain therapy/narcotics for numerous years.  Patient had reported increased pain in her lower back with x-rays showing moderate DJD the MRI of spine ordered.  She did have an MRI was referred to [facility] neurosurgeon who she states is giving her  injections in her back.   She also is currently taking Norco 7.5 mg q.6 hours p.r.n. which does help with her chronic pain issues.  She denies any side effects to medication and compliant w urine drug screen.  Requesting medication refill.    Follow-up anxiety/depression with patient currently on Valium 5 mg daily p.r.n. as well as Effexor 37.5 mg daily.  Patient denies panic attack and states anxiety depression overall stable at this current time.  Patient  states the Valium also helps relax her back another muscles.    PHQ Questionnaire         Past Medical History:   Diagnosis Date    Anxiety     Chronic back pain     COVID-19 vaccine series completed     Depression     Diabetes mellitus, type 2 (CMS HCC)     Fibromyalgia affecting multiple sites     Hypertension     Hypomagnesemia     Insomnia disorder     Migraine     Mixed hyperlipidemia     Orthostatic hypotension     Osteoarthritis     TIA (transient ischemic attack)     Unspecified glaucoma(365.9)     Vitamin D deficiency          Past Surgical History:   Procedure Laterality Date    HX CHOLECYSTECTOMY      HX KNEE REPLACMENT Left     HX ROTATOR CUFF REPAIR Left     SKIN CANCER EXCISION      forehead removed      Family Medical History:       Problem Relation (Age of Onset)    Kidney failure Mother    No Known Problems Sister, Brother, Half-Sister, Half-Brother, Maternal Aunt, Maternal Uncle, Paternal Aunt, Paternal Uncle, Maternal Grandmother, Maternal Grandfather, Paternal Grandmother, Paternal Grandfather, Daughter, Son    Respiratory Problems Father            Social History     Tobacco Use    Smoking status: Never    Smokeless tobacco: Never   Vaping Use    Vaping status: Never Used   Substance Use Topics    Alcohol use: Never    Drug use: Never     Medication:  d-mannose 500 mg Oral Capsule, Take 1 Capsule (500 mg total) by mouth Twice daily  ergocalciferol, vitamin D2, (DRISDOL) 1,250 mcg (50,000 unit) Oral Capsule, Take 1 Capsule (50,000 Units total) by mouth Every 7 days  famotidine (PEPCID) 20 mg Oral Tablet, TAKE 1 TABLET BY MOUTH TWICE DAILY (Patient taking differently: Take 1 Tablet (20 mg total) by mouth Every evening)  glimepiride (AMARYL) 4 mg Oral Tablet, TAKE 1 TABLET(4 MG) BY MOUTH TWICE DAILY WITH A MEAL FOR DIABETES  HYDROcodone-acetaminophen (NORCO) 7.5-325 mg Oral Tablet, Take 1 Tablet by mouth Every 6 hours as needed for up to 30 days Indications: pain  insulin glargine U-300 conc  (TOUJEO MAX U-300 SOLOSTAR) 300 unit/mL (3 mL) Subcutaneous Insulin Pen, Inject 20 Units under the skin Every night  latanoprost (XALATAN) 0.005 % Ophthalmic Drops, Administer 1 Drop into the left eye Every night  Levetiracetam 500 mg Oral Tablet Sustained  Release 24 hr, Take 1 Tablet (500 mg total) by mouth Every night for 90 days  loratadine (CLARITIN) 10 mg Oral Tablet, Take 1 Tablet (10 mg total) by mouth Once per day as needed  magnesium oxide (MAG-OX) 400 mg Oral Tablet, Take 2 Tablets (800 mg total) by mouth Once a day  MetFORMIN (GLUCOPHAGE) 1,000 mg Oral Tablet, Take 1 Tablet (1,000 mg total) by mouth Twice daily with food  rosuvastatin (CRESTOR) 10 mg Oral Tablet, TAKE 1 TABLET BY MOUTH EVERY NIGHT AT BEDTIME  tirzepatide (MOUNJARO) 2.5 mg/0.5 mL Subcutaneous Pen Injector, Inject 0.5 mL (2.5 mg total) under the skin Every 7 days for 30 days  venlafaxine (EFFEXOR XR) 37.5 mg Oral Capsule, Sust. Release 24 hr, Take 1 Capsule (37.5 mg total) by mouth Every night  diazePAM (VALIUM) 5 mg Oral Tablet, Take 1 Tablet (5 mg total) by mouth Once a day  empagliflozin (JARDIANCE) 25 mg Oral Tablet, Take 1 Tablet (25 mg total) by mouth Once a day  enalapril maleate (VASOTEC) 10 mg Oral Tablet, Take 1 Tablet (10 mg total) by mouth Once a day for 180 days  solifenacin (VESICARE) 5 mg Oral Tablet, TAKE 1 TABLET(5 MG) BY MOUTH EVERY DAY  SUMAtriptan (IMITREX) 50 mg Oral Tablet, Take 1 Tablet (50 mg total) by mouth Once, as needed for Migraine for up to 1 dose TAKE 1 TABLET BY MOUTH AT ONSET OF HEADACHE. IF NO RELIEF MAY REPEAT 1 AFTER AT LEAST 2 HOURS. MAX OF 4 TABS PER 24 HOURS    No facility-administered medications prior to visit.    Allergies:  Allergies   Allergen Reactions    Codeine Nausea/ Vomiting    Amoxicillin Diarrhea    Macrobid [Nitrofurantoin Monohyd/M-Cryst] Nausea/ Vomiting       Physical Exam:  Vitals:    05/13/23 1119   BP: 104/60   Pulse: 72   SpO2: 96%   Height: 1.676 m (5\' 6" )      Physical  Exam  Vitals and nursing note reviewed.   Constitutional:       Appearance: Normal appearance. She is obese.   HENT:      Right Ear: Tympanic membrane normal.      Left Ear: Tympanic membrane normal.      Mouth/Throat:      Mouth: Mucous membranes are moist.      Pharynx: Oropharynx is clear.   Eyes:      Extraocular Movements: Extraocular movements intact.      Pupils: Pupils are equal, round, and reactive to light.   Cardiovascular:      Rate and Rhythm: Normal rate and regular rhythm.      Pulses: Normal pulses.   Pulmonary:      Effort: Pulmonary effort is normal.      Breath sounds: Normal breath sounds.   Abdominal:      General: Bowel sounds are normal.      Palpations: Abdomen is soft.      Tenderness: There is no abdominal tenderness. There is no right CVA tenderness, left CVA tenderness or rebound.   Musculoskeletal:         General: Tenderness (Noted on palpation bilateral humerus however no redness warmth swelling noted) present. No swelling. Normal range of motion.      Cervical back: Normal range of motion and neck supple.      Comments: Arthritic changes hands and knees noted bilateral  Chronic trigger points upper back and thigh area  Chronic low back pain  with range of motion  Negative straight leg raises  Gait stable   Skin:     General: Skin is warm.      Findings: No lesion or rash.   Neurological:      General: No focal deficit present.      Mental Status: She is alert and oriented to person, place, and time.      Sensory: No sensory deficit.      Motor: Weakness (Strength of upper and lower extremities decreased however equal bilateral) present.      Gait: Gait normal.   Psychiatric:         Mood and Affect: Mood normal.         Behavior: Behavior normal.         Thought Content: Thought content normal.         Judgment: Judgment normal.       Assessment/Plan:  Problem List Items Addressed This Visit       Anxiety    Depression    Uncontrolled type 2 diabetes mellitus with hyperglycemia (CMS  HCC)    Chronic pain    B12 deficiency    DJD (degenerative joint disease), lumbar    Essential hypertension    Relevant Medications    enalapril maleate (VASOTEC) 10 mg Oral Tablet    Chronic kidney disease (CKD), stage II (mild)    Chronic back pain     Other Visit Diagnoses       Muscle ache of extremity    -  Primary              Labs up-to-date  Depo-Medrol injection given for arm pain/fibromyalgia  Prescription for Humalog sliding scale as directed also given  Sample of Libre 2 given to patient with prescription to follow  Encouraged patient to keep a close eye on blood sugar status post steroid injection  Increase Jardiance 25 mg daily with samples given  Strict diet discussed   Meds refilled as noted  Refuses colonoscopy  To follow-up with nephrologist/urologist /neurologist and neurosurgeon as scheduled     Post-Discharge Follow Up Appointments       Wednesday Jun 10, 2023    Return Patient Visit with Eyvonne Mechanic, PA-C at  2:00 PM      Wednesday Jul 08, 2023    Return Patient Visit with Eyvonne Mechanic, PA-C at 10:30 AM      Friday Jul 31, 2023    Lab with Lab, Nephrology Prn Mab [place] at  [facility] PM      Wednesday Aug 05, 2023    Return Patient Visit with Eyvonne Mechanic, PA-C at 10:30 AM      Friday Aug 07, 2023    Return Patient Visit with Cydney Ok, NP at  1:00 PM      Wednesday Sep 02, 2023    Return Patient Visit with Eyvonne Mechanic, PA-C at 10:30 AM      Friday Oct 09, 2023    Return Patient Visit with Arlana Hove, DO at  2:00 PM      Monday Apr 25, 2024    Return Telephone Visit with Eyvonne Mechanic, PA-C at  1:30 PM      Internal Medicine, Building A  Building A, [place]  [address]  [place] [address]  [phone] Nephrology, [facility]  [facility], [place]  [address]  [place] [address]  [phone] Urology, [facility] [place], [place]  [address]  [place] [address]  [phone]         Seek medical  attention for new or worsening symptoms.  Patient has been seen in this clinic within the last 3 years.     Johathan Province Hyman Bible, PA-C        I personally reviewed the documentation of care provided by the certified physician assistant and I agree with her medical decision making, PDMP reviewed. Controlled substance sent. Weyman Pedro, D.O.     This note was partially created using M[employer] Fluency Direct system (voice recognition software) and is inherently subject to errors including those of syntax and "sound-alike" substitutions which may escape proofreading.  In such instances, original meaning may be extrapolated by contextual derivation.

## 2023-05-13 NOTE — Nursing Note (Signed)
Patient is here for routine 1 month follow up for medication refills. Patient states that her arms are hurting her also.

## 2023-05-14 MED ORDER — HYDROCODONE 7.5 MG-ACETAMINOPHEN 325 MG TABLET
1.0000 | ORAL_TABLET | Freq: Four times a day (QID) | ORAL | 0 refills | Status: DC | PRN
Start: 2023-05-14 — End: 2023-06-10

## 2023-05-15 ENCOUNTER — Telehealth (INDEPENDENT_AMBULATORY_CARE_PROVIDER_SITE_OTHER): Payer: Self-pay | Admitting: Physician Assistant

## 2023-06-10 ENCOUNTER — Encounter (INDEPENDENT_AMBULATORY_CARE_PROVIDER_SITE_OTHER): Payer: Self-pay | Admitting: Physician Assistant

## 2023-06-10 ENCOUNTER — Other Ambulatory Visit (INDEPENDENT_AMBULATORY_CARE_PROVIDER_SITE_OTHER): Payer: Self-pay | Admitting: Physician Assistant

## 2023-06-10 ENCOUNTER — Other Ambulatory Visit: Payer: Self-pay

## 2023-06-10 ENCOUNTER — Ambulatory Visit: Payer: Medicare Other | Attending: Physician Assistant | Admitting: Physician Assistant

## 2023-06-10 VITALS — BP 130/78 | HR 78 | Temp 97.5°F | Wt 189.0 lb

## 2023-06-10 DIAGNOSIS — I10 Essential (primary) hypertension: Secondary | ICD-10-CM | POA: Insufficient documentation

## 2023-06-10 DIAGNOSIS — F419 Anxiety disorder, unspecified: Secondary | ICD-10-CM | POA: Insufficient documentation

## 2023-06-10 DIAGNOSIS — N182 Chronic kidney disease, stage 2 (mild): Secondary | ICD-10-CM | POA: Insufficient documentation

## 2023-06-10 DIAGNOSIS — G8929 Other chronic pain: Secondary | ICD-10-CM | POA: Insufficient documentation

## 2023-06-10 DIAGNOSIS — M47816 Spondylosis without myelopathy or radiculopathy, lumbar region: Secondary | ICD-10-CM | POA: Insufficient documentation

## 2023-06-10 DIAGNOSIS — M545 Low back pain, unspecified: Secondary | ICD-10-CM | POA: Insufficient documentation

## 2023-06-10 DIAGNOSIS — E1165 Type 2 diabetes mellitus with hyperglycemia: Secondary | ICD-10-CM | POA: Insufficient documentation

## 2023-06-10 MED ORDER — METFORMIN 1,000 MG TABLET
1000.0000 mg | ORAL_TABLET | Freq: Two times a day (BID) | ORAL | 1 refills | Status: DC
Start: 2023-06-10 — End: 2023-12-30

## 2023-06-10 MED ORDER — VENLAFAXINE ER 37.5 MG CAPSULE,EXTENDED RELEASE 24 HR
37.5000 mg | ORAL_CAPSULE | Freq: Every evening | ORAL | 1 refills | Status: DC
Start: 2023-06-10 — End: 2023-12-30

## 2023-06-10 MED ORDER — HYDROCODONE 7.5 MG-ACETAMINOPHEN 325 MG TABLET
1.0000 | ORAL_TABLET | Freq: Four times a day (QID) | ORAL | 0 refills | Status: DC | PRN
Start: 2023-06-10 — End: 2023-07-08

## 2023-06-10 MED ORDER — ERGOCALCIFEROL (VITAMIN D2) 1,250 MCG (50,000 UNIT) CAPSULE
50000.0000 [IU] | ORAL_CAPSULE | ORAL | 1 refills | Status: DC
Start: 2023-06-10 — End: 2024-01-27

## 2023-06-10 MED ORDER — GLIMEPIRIDE 4 MG TABLET
ORAL_TABLET | ORAL | 1 refills | Status: DC
Start: 2023-06-10 — End: 2023-09-15

## 2023-06-10 MED ORDER — DIAZEPAM 5 MG TABLET
5.0000 mg | ORAL_TABLET | Freq: Every day | ORAL | 2 refills | Status: DC
Start: 2023-06-10 — End: 2023-07-08

## 2023-06-10 NOTE — Nursing Note (Signed)
Patient is here for 8m CDM. Pt stated she has new concerns/complaints to discuss with provider

## 2023-06-10 NOTE — Progress Notes (Signed)
INTERNAL MEDICINE, BUILDING A  [address]  [place] [address]  Operated by [facility]  History and Physical     Name: Grace Hoffman MRN:  [id number]   Date: 06/10/2023 Age: 72 y.o.               Follow Up        Reason for Visit: Follow Up (Routine 45m CDM)  med refills    History of Present Illness  Grace Hoffman is a 72 y.o. female who is being seen today in the office for f/u concerning blood pressure/hypertension currently 130/78.  She is currently taking Vasotec 10 mg daily as well as hydrochlorothiazide 25 mg daily p.r.n.Marland Kitchen   Denies chest pain.  She does continue to have chronic dizziness for which she is seeing the neurologist and has had a MRI of her brain which noted signs of TIAs.  She states she is currently doing physical therapy for the dizziness and gait issues which seems to be helping.  Occasional headaches noted also.  She has a follow-up with Dr. Aretha Parrot scheduled.  She was also seen by the ENT where she had her crystals realigned.  She states this helped a little bit but she feels like her balance is still an issue.    Follow-up chronic kidney disease with patient being monitored by nephrologist.  Last note showed:    NEPHROLOGY, [facility]  [address] [place] [address]     Progress Note     Name:              Grace Hoffman MRN:              [id number]   Date:              04/21/2023 DOB:  [birth year] (72 y.o.)            Nephrology Out  Patient Visit        HPI : 72 y.o.  female Chronic Kidney Disease Stage IIIa here for a follow up labs. Past medical History of Hypertension, diabetes mellitus type 2 non-insulin-dependent, and recurrent urinary tract infections.  Denies complaints of nausea and vomiting.  Reports no problems with edema.  Denies current NSAID use.  States blood pressure has been around 130s over 80s.  Hemoglobin A1c is 8.3.  Denies hematuria and dysuria.    ASSESSMENT / PLAN:        ENCOUNTER DIAGNOSES       ICD-10-CM   1.  CKD (chronic kidney disease) stage 3, GFR 30-59 ml/min (CMS HCC)  N18.30   2. DM (diabetes mellitus) (CMS HCC)  E11.9   3. Hypertension, unspecified type  I10   4. Vitamin D deficiency  E55.9   5. Hypomagnesemia  E83.42               Chronic kidney disease  -Stage IIIb  -Due to diabetes and hypertension  -Creatinine is stable    1.03/GFR 57  -Baseline creatinine 1.04  -Total protein to creatinine ratio no significant proteinuria  -CBC and a basic metabolic panel 1.0  -Return to clinic in 6 months  -Continue low-sodium diet  -balanced Fluid intake 40-50 oz a day.    -Avoid NSAIDs.  Tylenol only for pain  -Renal imaging: no hydronephrosis, Simple right renal cyst  -ACEI or ARBS:  yes  -continue enalapril 10 mg daily  -Sodium Glucose Cotransporter-2 (SGLT2) Inhibitors: Yes  -Continue Jardiance 25 mg  daily            -  Hypertension  -Blood pressure is acceptable  -Goal less than 130/80  -Continue enalapril 10 mg daily  -Low-salt diet           Diabetes type 2  -A1c goal is less than 7         Lab Results   Component Value Date     HA1C 8.3 (H) 04/15/2023      -Continue Jardiance 25 mg daily.  -Diabetic diet  -Increase activity and exercise as possible  -Monitor A1C        Hypomagnesemia  -continue Magnesium supplements   -Monitor Magnesium level     Vitamin D Deficiency  -Monitor Vitamin D Level        I have discussed with the patient the following issues:  The main goal of treatment is to prevent progression of CKD to complete kidney failure. The best way to do this is to control the underlying cause.  the optimal diet is one similar to the Dietary Approaches to Stop Hypertension (DASH) diet, consisting of fruits, vegetables, legumes, fish, poultry, and whole grains.  Restrict sodium intake to less than 2 g/day  Education provided on diabetic diet blood sugar control.         Orders Placed This Encounter    BASIC METABOLIC PANEL    CBC/DIFF    MAGNESIUM    MICROALBUMIN/CREATININE RATIO, URINE, RANDOM    PARATHYROID  HORMONE (PTH)    URIC ACID    PROTEIN/CREATININE RATIO, URINE, RANDOM    VITAMIN D 25 TOTAL       Cydney Ok, NP        F/U BS w history of uncontrolled diabetes with report blood sugar 155 this morning.  Last A1c also elevated.  Currently patient is on metformin at 1000 mg twice daily along with glimepiride 4 mg twice daily  Toujeo 20 units nightly Humalog sliding scale and recently added Jardiance 25 mg daily Noted she was unable to tolerate being injectables such as Ozempic and Mounjaro due to GI side effects.  Denies increased thirst or urination states she is trying to watch her diet better.    Follow-up chronic pain/DJD/fibromyalgia for which she has been on chronic pain therapy/narcotics for numerous years.  Patient had reported increased pain in her lower back with x-rays showing moderate DJD the MRI of spine ordered.  She did have an MRI was referred to [facility] neurosurgeon where she did obtain injections in her back area.  She also is currently taking Norco 7.5 mg q.6 hours p.r.n. which does help with her chronic pain issues.  She denies any side effects to medication and compliant w urine drug screen.  Requesting medication refill.    Follow-up anxiety/depression with patient currently on Valium 5 mg daily p.r.n. as well as Effexor 37.5 mg daily.  Patient denies panic attack and states anxiety depression overall stable at this current time.    PHQ Questionnaire         Past Medical History:   Diagnosis Date    Anxiety     Chronic back pain     COVID-19 vaccine series completed     Depression     Diabetes mellitus, type 2 (CMS HCC)     Fibromyalgia affecting multiple sites     Hypertension     Hypomagnesemia     Insomnia disorder     Migraine     Mixed hyperlipidemia  Orthostatic hypotension     Osteoarthritis     TIA (transient ischemic attack)     Unspecified glaucoma(365.9)     Vitamin D deficiency          Past Surgical History:   Procedure Laterality Date    HX CHOLECYSTECTOMY      HX KNEE  REPLACMENT Left     HX ROTATOR CUFF REPAIR Left     SKIN CANCER EXCISION      forehead removed      Family Medical History:       Problem Relation (Age of Onset)    Kidney failure Mother    No Known Problems Sister, Brother, Half-Sister, Half-Brother, Maternal Aunt, Maternal Uncle, Paternal Aunt, Paternal Uncle, Maternal Grandmother, Maternal Grandfather, Paternal Grandmother, Paternal Grandfather, Daughter, Son    Respiratory Problems Father            Social History     Tobacco Use    Smoking status: Never    Smokeless tobacco: Never   Vaping Use    Vaping status: Never Used   Substance Use Topics    Alcohol use: Never    Drug use: Never     Medication:  d-mannose 500 mg Oral Capsule, Take 1 Capsule (500 mg total) by mouth Twice daily  empagliflozin (JARDIANCE) 25 mg Oral Tablet, Take 1 Tablet (25 mg total) by mouth Once a day  enalapril maleate (VASOTEC) 10 mg Oral Tablet, Take 1 Tablet (10 mg total) by mouth Once a day for 180 days  famotidine (PEPCID) 20 mg Oral Tablet, TAKE 1 TABLET BY MOUTH TWICE DAILY (Patient taking differently: Take 1 Tablet (20 mg total) by mouth Every evening)  flash glucose scanning reader (FREESTYLE LIBRE 2 READER) Does not apply Misc, 1 Each Every 14 days  flash glucose sensor (FREESTYLE LIBRE 2 SENSOR) Does not apply Kit, 1 Each Every 14 days  insulin glargine U-300 conc (TOUJEO MAX U-300 SOLOSTAR) 300 unit/mL (3 mL) Subcutaneous Insulin Pen, Inject 20 Units under the skin Every night  insulin lispro (HUMALOG KWIKPEN INSULIN) 100 unit/mL Subcutaneous Insulin Pen, Use as directed sliding scale Indications: type 2 diabetes mellitus  latanoprost (XALATAN) 0.005 % Ophthalmic Drops, Administer 1 Drop into the left eye Every night  Levetiracetam 500 mg Oral Tablet Sustained Release 24 hr, Take 1 Tablet (500 mg total) by mouth Every night for 90 days  loratadine (CLARITIN) 10 mg Oral Tablet, Take 1 Tablet (10 mg total) by mouth Once per day as needed  magnesium oxide (MAG-OX) 400 mg Oral  Tablet, Take 2 Tablets (800 mg total) by mouth Once a day  rosuvastatin (CRESTOR) 10 mg Oral Tablet, TAKE 1 TABLET BY MOUTH EVERY NIGHT AT BEDTIME  solifenacin (VESICARE) 5 mg Oral Tablet, Take 1 Tablet (5 mg total) by mouth Once a day  SUMAtriptan (IMITREX) 50 mg Oral Tablet, Take 1 Tablet (50 mg total) by mouth Once, as needed for Migraine TAKE 1 TABLET BY MOUTH AT ONSET OF HEADACHE. IF NO RELIEF MAY REPEAT 1 AFTER AT LEAST 2 HOURS. MAX OF 4 TABS PER 24 HOURS  diazePAM (VALIUM) 5 mg Oral Tablet, Take 1 Tablet (5 mg total) by mouth Once a day  ergocalciferol, vitamin D2, (DRISDOL) 1,250 mcg (50,000 unit) Oral Capsule, Take 1 Capsule (50,000 Units total) by mouth Every 7 days  glimepiride (AMARYL) 4 mg Oral Tablet, TAKE 1 TABLET(4 MG) BY MOUTH TWICE DAILY WITH A MEAL FOR DIABETES  HYDROcodone-acetaminophen (NORCO) 7.5-325 mg Oral Tablet, Take 1 Tablet by  mouth Every 6 hours as needed for up to 30 days Indications: pain  MetFORMIN (GLUCOPHAGE) 1,000 mg Oral Tablet, Take 1 Tablet (1,000 mg total) by mouth Twice daily with food  tirzepatide (MOUNJARO) 2.5 mg/0.5 mL Subcutaneous Pen Injector, Inject 0.5 mL (2.5 mg total) under the skin Every 7 days for 30 days  venlafaxine (EFFEXOR XR) 37.5 mg Oral Capsule, Sust. Release 24 hr, Take 1 Capsule (37.5 mg total) by mouth Every night    No facility-administered medications prior to visit.    Allergies:  Allergies   Allergen Reactions    Codeine Nausea/ Vomiting    Amoxicillin Diarrhea    Macrobid [Nitrofurantoin Monohyd/M-Cryst] Nausea/ Vomiting       Physical Exam:  Vitals:    06/10/23 1411   BP: 130/78   Pulse: 78   Temp: 36.4 C (97.5 F)   SpO2: 96%   Weight: 85.7 kg (189 lb)      Physical Exam  Vitals and nursing note reviewed.   Constitutional:       Appearance: Normal appearance. She is obese.   HENT:      Right Ear: Tympanic membrane normal.      Left Ear: Tympanic membrane normal.      Mouth/Throat:      Mouth: Mucous membranes are moist.      Pharynx: Oropharynx is  clear.   Eyes:      Extraocular Movements: Extraocular movements intact.      Pupils: Pupils are equal, round, and reactive to light.   Cardiovascular:      Rate and Rhythm: Normal rate and regular rhythm.      Pulses: Normal pulses.   Pulmonary:      Effort: Pulmonary effort is normal.      Breath sounds: Normal breath sounds.   Abdominal:      General: Bowel sounds are normal.      Palpations: Abdomen is soft.      Tenderness: There is no abdominal tenderness. There is no right CVA tenderness, left CVA tenderness or rebound.   Musculoskeletal:         General: No swelling or tenderness. Normal range of motion.      Cervical back: Normal range of motion and neck supple.      Comments: Arthritic changes hands and knees noted bilateral  Chronic trigger points upper back and thigh area  Chronic low back pain with range of motion  Negative straight leg raises  Gait stable   Skin:     General: Skin is warm.      Findings: No lesion or rash.   Neurological:      General: No focal deficit present.      Mental Status: She is alert and oriented to person, place, and time.      Sensory: No sensory deficit.      Motor: Weakness (Strength of upper and lower extremities decreased however equal bilateral) present.      Gait: Gait normal.   Psychiatric:         Mood and Affect: Mood normal.         Behavior: Behavior normal.         Thought Content: Thought content normal.         Judgment: Judgment normal.         Assessment/Plan:  Problem List Items Addressed This Visit       Hypertension - Primary    Anxiety    Uncontrolled type 2 diabetes mellitus with  hyperglycemia (CMS HCC)    DJD (degenerative joint disease), lumbar    Chronic kidney disease (CKD), stage II (mild)    Chronic back pain         Labs up-to-date  Meds refilled as noted  Continue blood pressure/blood sugar monitoring outpatient  Refuses colonoscopy  To follow-up with nephrologist/urologist /neurologist as scheduled   Post-Discharge Follow Up Appointments        Wednesday Jul 08, 2023    Return Patient Visit with Eyvonne Mechanic, PA-C at 10:30 AM      Friday Jul 31, 2023    Lab with Lab, Nephrology Prn [place] at  [facility] PM      Wednesday Aug 05, 2023    Return Patient Visit with Eyvonne Mechanic, PA-C at 10:30 AM      Friday Aug 07, 2023    Return Patient Visit with Cydney Ok, NP at  1:00 PM      Wednesday Sep 02, 2023    Return Patient Visit with Eyvonne Mechanic, PA-C at 10:30 AM      Friday Oct 09, 2023    Return Patient Visit with Arlana Hove, DO at  2:00 PM      Monday Apr 25, 2024    Return Telephone Visit with Eyvonne Mechanic, PA-C at  1:30 PM      Internal Medicine, Building A  Building [facility]  [address]  [place] [address]  [phone] Nephrology, [facility]  [facility], [place]  [address]  [place] [address]  [phone] Urology, [facility] [place], [place]  [address]  [place] [address]  [phone]              Seek medical attention for new or worsening symptoms.  Patient has been seen in this clinic within the last 3 years.     Amy Hyman Bible, PA-C      I personally reviewed the documentation of care provided by the certified physician assistant and I agree with her medical decision making, Weyman Pedro, D.O.       This note was partially created using MModal Fluency Direct system (voice recognition software) and is inherently subject to errors including those of syntax and "sound-alike" substitutions which may escape proofreading.  In such instances, original meaning may be extrapolated by contextual derivation.

## 2023-06-10 NOTE — Telephone Encounter (Signed)
Patient is here for routine appointment for medication refills

## 2023-07-08 ENCOUNTER — Other Ambulatory Visit: Payer: Self-pay

## 2023-07-08 ENCOUNTER — Ambulatory Visit: Payer: Medicare Other | Attending: Physician Assistant | Admitting: Physician Assistant

## 2023-07-08 ENCOUNTER — Other Ambulatory Visit (INDEPENDENT_AMBULATORY_CARE_PROVIDER_SITE_OTHER): Payer: Self-pay | Admitting: Physician Assistant

## 2023-07-08 ENCOUNTER — Encounter (INDEPENDENT_AMBULATORY_CARE_PROVIDER_SITE_OTHER): Payer: Self-pay | Admitting: Physician Assistant

## 2023-07-08 VITALS — BP 122/72 | HR 81 | Temp 97.3°F | Wt 194.0 lb

## 2023-07-08 DIAGNOSIS — N182 Chronic kidney disease, stage 2 (mild): Secondary | ICD-10-CM | POA: Insufficient documentation

## 2023-07-08 DIAGNOSIS — E538 Deficiency of other specified B group vitamins: Secondary | ICD-10-CM | POA: Insufficient documentation

## 2023-07-08 DIAGNOSIS — M25511 Pain in right shoulder: Secondary | ICD-10-CM | POA: Insufficient documentation

## 2023-07-08 DIAGNOSIS — M7551 Bursitis of right shoulder: Secondary | ICD-10-CM

## 2023-07-08 DIAGNOSIS — R5383 Other fatigue: Secondary | ICD-10-CM | POA: Insufficient documentation

## 2023-07-08 DIAGNOSIS — I10 Essential (primary) hypertension: Secondary | ICD-10-CM

## 2023-07-08 DIAGNOSIS — F419 Anxiety disorder, unspecified: Secondary | ICD-10-CM

## 2023-07-08 DIAGNOSIS — M25512 Pain in left shoulder: Secondary | ICD-10-CM | POA: Insufficient documentation

## 2023-07-08 DIAGNOSIS — E785 Hyperlipidemia, unspecified: Secondary | ICD-10-CM

## 2023-07-08 DIAGNOSIS — E1165 Type 2 diabetes mellitus with hyperglycemia: Secondary | ICD-10-CM | POA: Insufficient documentation

## 2023-07-08 DIAGNOSIS — M545 Low back pain, unspecified: Secondary | ICD-10-CM

## 2023-07-08 DIAGNOSIS — G8929 Other chronic pain: Secondary | ICD-10-CM | POA: Insufficient documentation

## 2023-07-08 LAB — CBC WITH DIFF
BASOPHIL #: 0.1 10*3/uL (ref 0.00–0.10)
BASOPHIL %: 1 % (ref 0–1)
EOSINOPHIL #: 0.2 10*3/uL (ref 0.00–0.50)
EOSINOPHIL %: 3 %
HCT: 44.6 % — ABNORMAL HIGH (ref 31.2–41.9)
HGB: 14.5 g/dL — ABNORMAL HIGH (ref 10.9–14.3)
LYMPHOCYTE #: 2.2 10*3/uL (ref 1.00–3.00)
LYMPHOCYTE %: 33 % (ref 16–44)
MCH: 32.1 pg (ref 24.7–32.8)
MCHC: 32.5 g/dL (ref 32.3–35.6)
MCV: 98.8 fL — ABNORMAL HIGH (ref 75.5–95.3)
MONOCYTE #: 0.5 10*3/uL (ref 0.30–1.00)
MONOCYTE %: 7 % (ref 5–13)
MPV: 11.3 fL — ABNORMAL HIGH (ref 7.9–10.8)
NEUTROPHIL #: 3.7 10*3/uL (ref 1.85–7.80)
NEUTROPHIL %: 55 % (ref 43–77)
PLATELETS: 182 10*3/uL (ref 140–440)
RBC: 4.51 10*6/uL (ref 3.63–4.92)
RDW: 13.7 % (ref 12.3–17.7)
WBC: 6.7 10*3/uL (ref 3.8–11.8)

## 2023-07-08 LAB — THYROID STIMULATING HORMONE WITH FREE T4 REFLEX: TSH: 1.081 u[IU]/mL (ref 0.450–5.330)

## 2023-07-08 LAB — LIPID PANEL
CHOL/HDL RATIO: 5
CHOLESTEROL: 229 mg/dL — ABNORMAL HIGH (ref <200)
HDL CHOL: 46 mg/dL (ref >=40)
LDL CALC: 142 mg/dL — ABNORMAL HIGH (ref 0–100)
TRIGLYCERIDES: 204 mg/dL — ABNORMAL HIGH (ref <=150)
VLDL CALC: 41 mg/dL (ref 0–50)

## 2023-07-08 LAB — URINALYSIS, MICROSCOPIC
NON-SQUAMOUS EPITHELIAL CELLS URINE: <1 /hpf (ref <=1)
RBCS: 2 /hpf (ref <4)
SQUAMOUS EPITHELIAL: 3 /hpf (ref <28)
WBCS: 3 /hpf (ref <6)

## 2023-07-08 LAB — URINALYSIS, MACROSCOPIC
BILIRUBIN: NEGATIVE mg/dL
BLOOD: NEGATIVE mg/dL
GLUCOSE: >1000 mg/dL — AB
KETONES: NEGATIVE mg/dL
LEUKOCYTES: 25 WBCs/uL — AB
NITRITE: NEGATIVE
PH: 5.5 (ref 5.0–9.0)
PROTEIN: NEGATIVE mg/dL
SPECIFIC GRAVITY: 1.037 — ABNORMAL HIGH (ref 1.002–1.030)
UROBILINOGEN: NORMAL mg/dL

## 2023-07-08 LAB — COMPREHENSIVE METABOLIC PNL, FASTING
ALBUMIN/GLOBULIN RATIO: 1.2 (ref 0.8–1.4)
ALBUMIN: 3.9 g/dL (ref 3.5–5.7)
ALKALINE PHOSPHATASE: 86 U/L (ref 34–104)
ALT (SGPT): 15 U/L (ref 7–52)
ANION GAP: 7 mmol/L (ref 4–13)
AST (SGOT): 16 U/L (ref 13–39)
BILIRUBIN TOTAL: 0.3 mg/dL (ref 0.3–1.2)
BUN/CREA RATIO: 17 (ref 6–22)
BUN: 19 mg/dL (ref 7–25)
CALCIUM, CORRECTED: 9.7 mg/dL (ref 8.9–10.8)
CALCIUM: 9.6 mg/dL (ref 8.6–10.3)
CHLORIDE: 106 mmol/L (ref 98–107)
CO2 TOTAL: 27 mmol/L (ref 21–31)
CREATININE: 1.1 mg/dL (ref 0.60–1.30)
ESTIMATED GFR: 53 mL/min/{1.73_m2} — ABNORMAL LOW (ref >59)
GLOBULIN: 3.3 (ref 2.9–5.4)
GLUCOSE: 285 mg/dL — ABNORMAL HIGH (ref 74–109)
OSMOLALITY, CALCULATED: 292 mOsm/kg — ABNORMAL HIGH (ref 270–290)
POTASSIUM: 4.9 mmol/L (ref 3.5–5.1)
PROTEIN TOTAL: 7.2 g/dL (ref 6.4–8.9)
SODIUM: 140 mmol/L (ref 136–145)

## 2023-07-08 LAB — MICROALBUMIN/CREATININE RATIO, URINE, RANDOM
CREATININE RANDOM URINE: 76 mg/dL (ref 30–125)
MICROALBUMIN RANDOM URINE: <0.7 mg/dL

## 2023-07-08 MED ORDER — TRIAMCINOLONE ACETONIDE 40 MG/ML SUSPENSION FOR INJECTION
40.0000 mg | INTRAMUSCULAR | Status: AC
Start: 2023-07-08 — End: 2023-07-08
  Administered 2023-07-08: 40 mg via INTRAMUSCULAR

## 2023-07-08 MED ORDER — DICLOFENAC 20 MG/GRAM/ACTUATION (2 %) TOPICAL SOLN METERED-DOSE PUMP
1.0000 | Freq: Two times a day (BID) | CUTANEOUS | 3 refills | Status: DC
Start: 2023-07-08 — End: 2024-01-27

## 2023-07-08 MED ORDER — DIAZEPAM 5 MG TABLET
5.0000 mg | ORAL_TABLET | Freq: Every day | ORAL | 2 refills | Status: DC
Start: 2023-07-08 — End: 2023-08-06

## 2023-07-08 MED ORDER — HYDROCODONE 7.5 MG-ACETAMINOPHEN 325 MG TABLET
1.0000 | ORAL_TABLET | Freq: Four times a day (QID) | ORAL | 0 refills | Status: DC | PRN
Start: 2023-07-08 — End: 2023-08-06

## 2023-07-08 MED ORDER — CYANOCOBALAMIN (VIT B-12) 1,000 MCG/ML INJECTION SOLUTION
1000.0000 ug | INTRAMUSCULAR | Status: AC
Start: 2023-07-08 — End: 2023-07-08
  Administered 2023-07-08: 1000 ug via SUBCUTANEOUS

## 2023-07-08 NOTE — Progress Notes (Signed)
INTERNAL MEDICINE, BUILDING A  [address]  [place] [address]  Operated by [facility]  History and Physical     Name: Grace Hoffman MRN:  [id number]   Date: 07/08/2023 Age: 72 y.o.               Follow Up        Reason for Visit: Follow Up (Routine 41m CDM) shoulder pain bilat; labs    History of Present Illness  Grace Hoffman is a 72 y.o. female who is being seen today in the office for f/u however complaining of shoulder pain bilateral that she has had now for the last month states right greater than left.  Patient denies any fall or injury she does have a history of fibromyalgia so chronic pain issues noted.  Is requesting steroid injection today which she states always helps.    Follow-up  blood pressure/hypertension currently 122/72.  She is currently taking Vasotec 10 mg daily as well as hydrochlorothiazide 25 mg daily p.r.n.Marland Kitchen   Denies chest pain.  She does continue to have chronic dizziness for which she is seeing the neurologist and has had a MRI of her brain which noted signs of TIAs.  She states she is currently doing physical therapy for the dizziness and gait issues which seems to be helping.  Occasional headaches noted also.  She has a follow-up with Dr. Aretha Parrot scheduled.  Note patient also has been seen by the ENT where she had her crystals realigned.  She states this helped a little bit but she feels like her balance is still an issue.    F/U BS w history of uncontrolled diabetes with last hemoglobin 8.3 which was down from previous A1c 9.1.  Currently patient is on metformin at 1000 mg twice daily along with glimepiride 4 mg twice daily Jardiance 25 mg daily Toujeo 30 units nightly and Humalog 5 units with meals.  Noted she was unable to tolerate being injectables such as Ozempic and Mounjaro due to GI side effects.  She has a history of poor compliance noted but states her sugar has been doing better at home when she checks it.  Denies increased thirst or  urination.  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    Follow-up B12 deficiency with chronic fatigue issues noted.  Patient is requesting B12 injection today which do seem to help with her energy level.  Last B12 level when checked in lab stable however.    Follow-up chronic pain/DJD/fibromyalgia for which she has been on chronic pain therapy/narcotics for numerous years.  Patient had reported increased pain in her lower back with x-rays showing moderate DJD the MRI of spine ordered.  She did have an MRI was referred to [facility] neurosurgeon who she states she has an appointment on December 26 to obtain injections in her back area.  She also is currently taking Norco 7.5 mg q.6 hours p.r.n. which does help with her chronic pain issues.  She denies any side effects to medication and compliant w urine drug screen.  Requesting medication refill.    Follow-up anxiety/depression with patient currently on Valium 5 mg daily p.r.n. as well as Effexor 37.5 mg daily.  Patient denies panic attack and states anxiety depression overall stable at this current time.    PHQ Questionnaire         Past Medical History:   Diagnosis Date    Anxiety     Chronic back pain  COVID-19 vaccine series completed     Depression     Diabetes mellitus, type 2 (CMS HCC)     Fibromyalgia affecting multiple sites     Hypertension     Hypomagnesemia     Insomnia disorder     Migraine     Mixed hyperlipidemia     Orthostatic hypotension     Osteoarthritis     TIA (transient ischemic attack)     Unspecified glaucoma(365.9)     Vitamin D deficiency          Past Surgical History:   Procedure Laterality Date    HX CHOLECYSTECTOMY      HX KNEE REPLACMENT Left     HX ROTATOR CUFF REPAIR Left     SKIN CANCER EXCISION      forehead removed      Family Medical History:       Problem Relation (Age of Onset)    Kidney failure Mother    No Known Problems Sister, Brother, Half-Sister, Half-Brother, Maternal Aunt, Maternal Uncle, Paternal  Aunt, Paternal Uncle, Maternal Grandmother, Maternal Grandfather, Paternal Grandmother, Paternal Grandfather, Daughter, Son    Respiratory Problems Father            Social History     Tobacco Use    Smoking status: Never    Smokeless tobacco: Never   Vaping Use    Vaping status: Never Used   Substance Use Topics    Alcohol use: Never    Drug use: Never     Medication:  No facility-administered medications prior to visit.  d-mannose 500 mg Oral Capsule, Take 1 Capsule (500 mg total) by mouth Twice daily  empagliflozin (JARDIANCE) 25 mg Oral Tablet, Take 1 Tablet (25 mg total) by mouth Once a day  enalapril maleate (VASOTEC) 10 mg Oral Tablet, Take 1 Tablet (10 mg total) by mouth Once a day for 180 days  ergocalciferol, vitamin D2, (DRISDOL) 1,250 mcg (50,000 unit) Oral Capsule, Take 1 Capsule (50,000 Units total) by mouth Every 7 days (Patient taking differently: Take 1 Capsule (50,000 Units total) by mouth Every 7 days On Monday)  glimepiride (AMARYL) 4 mg Oral Tablet, TAKE 1 TABLET(4 MG) BY MOUTH TWICE DAILY WITH A MEAL FOR DIABETES  latanoprost (XALATAN) 0.005 % Ophthalmic Drops, Administer 1 Drop into the left eye Every night  Levetiracetam 500 mg Oral Tablet Sustained Release 24 hr, Take 1 Tablet (500 mg total) by mouth Every night for 90 days  loratadine (CLARITIN) 10 mg Oral Tablet, Take 1 Tablet (10 mg total) by mouth Once per day as needed  magnesium oxide (MAG-OX) 400 mg Oral Tablet, Take 2 Tablets (800 mg total) by mouth Once a day  MetFORMIN (GLUCOPHAGE) 1,000 mg Oral Tablet, Take 1 Tablet (1,000 mg total) by mouth Twice daily with food  solifenacin (VESICARE) 5 mg Oral Tablet, Take 1 Tablet (5 mg total) by mouth Once a day  SUMAtriptan (IMITREX) 50 mg Oral Tablet, Take 1 Tablet (50 mg total) by mouth Once, as needed for Migraine TAKE 1 TABLET BY MOUTH AT ONSET OF HEADACHE. IF NO RELIEF MAY REPEAT 1 AFTER AT LEAST 2 HOURS. MAX OF 4 TABS PER 24 HOURS  venlafaxine (EFFEXOR XR) 37.5 mg Oral Capsule, Sust.  Release 24 hr, Take 1 Capsule (37.5 mg total) by mouth Every night  diazePAM (VALIUM) 5 mg Oral Tablet, Take 1 Tablet (5 mg total) by mouth Once a day  famotidine (PEPCID) 20 mg Oral Tablet, TAKE 1 TABLET BY MOUTH  TWICE DAILY (Patient taking differently: Take 1 Tablet (20 mg total) by mouth Every evening)  flash glucose scanning reader (FREESTYLE LIBRE 2 READER) Does not apply Misc, 1 Each Every 14 days  flash glucose sensor (FREESTYLE LIBRE 2 SENSOR) Does not apply Kit, 1 Each Every 14 days  HYDROcodone-acetaminophen (NORCO) 7.5-325 mg Oral Tablet, Take 1 Tablet by mouth Every 6 hours as needed for up to 30 days Indications: pain  insulin glargine U-300 conc (TOUJEO MAX U-300 SOLOSTAR) 300 unit/mL (3 mL) Subcutaneous Insulin Pen, Inject 20 Units under the skin Every night  insulin lispro (HUMALOG KWIKPEN INSULIN) 100 unit/mL Subcutaneous Insulin Pen, Use as directed sliding scale Indications: type 2 diabetes mellitus  rosuvastatin (CRESTOR) 10 mg Oral Tablet, TAKE 1 TABLET BY MOUTH EVERY NIGHT AT BEDTIME      Allergies:  Allergies   Allergen Reactions    Codeine Nausea/ Vomiting    Amoxicillin Diarrhea    Macrobid [Nitrofurantoin Monohyd/M-Cryst] Nausea/ Vomiting       Physical Exam:  Vitals:    07/08/23 1053   BP: 122/72   Pulse: 81   Temp: 36.3 C (97.3 F)   SpO2: 93%   Weight: 88 kg (194 lb)      Physical Exam  Vitals and nursing note reviewed.   Constitutional:       Appearance: Normal appearance. She is obese.   HENT:      Right Ear: Tympanic membrane normal.      Left Ear: Tympanic membrane normal.      Mouth/Throat:      Mouth: Mucous membranes are moist.      Pharynx: Oropharynx is clear.   Eyes:      Extraocular Movements: Extraocular movements intact.      Pupils: Pupils are equal, round, and reactive to light.   Cardiovascular:      Rate and Rhythm: Normal rate and regular rhythm.      Pulses: Normal pulses.   Pulmonary:      Effort: Pulmonary effort is normal.      Breath sounds: Normal breath sounds.    Abdominal:      General: Bowel sounds are normal.      Palpations: Abdomen is soft.      Tenderness: There is no abdominal tenderness. There is no right CVA tenderness, left CVA tenderness or rebound.   Musculoskeletal:         General: No swelling or tenderness. Normal range of motion.      Cervical back: Normal range of motion and neck supple.      Comments: Decreased range of motion including abduction bilateral shoulders this morning with soreness when touched however increased over the right shoulder bursa    Arthritic changes hands and knees noted bilateral  Chronic trigger points upper back and thigh area  Chronic low back pain with range of motion  Negative straight leg raises  Gait stable   Skin:     General: Skin is warm.      Findings: No lesion or rash.   Neurological:      General: No focal deficit present.      Mental Status: She is alert and oriented to person, place, and time.      Sensory: No sensory deficit.      Motor: Weakness (Strength of upper and lower extremities decreased however equal bilateral) present.      Gait: Gait normal.   Psychiatric:         Mood and Affect: Mood  normal.         Behavior: Behavior normal.         Thought Content: Thought content normal.         Judgment: Judgment normal.         Assessment/Plan:  Problem List Items Addressed This Visit       Hypertension    Relevant Orders    CBC/DIFF (Completed)    COMPREHENSIVE METABOLIC PNL, FASTING (Completed)    HGA1C (HEMOGLOBIN A1C WITH EST AVG GLUCOSE) (Completed)    URINALYSIS, MACROSCOPIC AND MICROSCOPIC W/CULTURE REFLEX (Completed)    LIPID PANEL (Completed)    MICROALBUMIN/CREATININE RATIO, URINE, RANDOM (Completed)    THYROID STIMULATING HORMONE WITH FREE T4 REFLEX (Completed)    Anxiety    Uncontrolled type 2 diabetes mellitus with hyperglycemia (CMS HCC)    Relevant Orders    CBC/DIFF (Completed)    COMPREHENSIVE METABOLIC PNL, FASTING (Completed)    HGA1C (HEMOGLOBIN A1C WITH EST AVG GLUCOSE) (Completed)     URINALYSIS, MACROSCOPIC AND MICROSCOPIC W/CULTURE REFLEX (Completed)    LIPID PANEL (Completed)    MICROALBUMIN/CREATININE RATIO, URINE, RANDOM (Completed)    THYROID STIMULATING HORMONE WITH FREE T4 REFLEX (Completed)    B12 deficiency    Relevant Orders    CBC/DIFF (Completed)    COMPREHENSIVE METABOLIC PNL, FASTING (Completed)    HGA1C (HEMOGLOBIN A1C WITH EST AVG GLUCOSE) (Completed)    URINALYSIS, MACROSCOPIC AND MICROSCOPIC W/CULTURE REFLEX (Completed)    LIPID PANEL (Completed)    MICROALBUMIN/CREATININE RATIO, URINE, RANDOM (Completed)    THYROID STIMULATING HORMONE WITH FREE T4 REFLEX (Completed)    Fatigue    Relevant Orders    CBC/DIFF (Completed)    COMPREHENSIVE METABOLIC PNL, FASTING (Completed)    HGA1C (HEMOGLOBIN A1C WITH EST AVG GLUCOSE) (Completed)    URINALYSIS, MACROSCOPIC AND MICROSCOPIC W/CULTURE REFLEX (Completed)    LIPID PANEL (Completed)    MICROALBUMIN/CREATININE RATIO, URINE, RANDOM (Completed)    THYROID STIMULATING HORMONE WITH FREE T4 REFLEX (Completed)    Chronic kidney disease (CKD), stage II (mild)    Relevant Orders    CBC/DIFF (Completed)    COMPREHENSIVE METABOLIC PNL, FASTING (Completed)    HGA1C (HEMOGLOBIN A1C WITH EST AVG GLUCOSE) (Completed)    URINALYSIS, MACROSCOPIC AND MICROSCOPIC W/CULTURE REFLEX (Completed)    LIPID PANEL (Completed)    MICROALBUMIN/CREATININE RATIO, URINE, RANDOM (Completed)    THYROID STIMULATING HORMONE WITH FREE T4 REFLEX (Completed)    Chronic back pain    Relevant Orders    CBC/DIFF (Completed)    COMPREHENSIVE METABOLIC PNL, FASTING (Completed)    HGA1C (HEMOGLOBIN A1C WITH EST AVG GLUCOSE) (Completed)    URINALYSIS, MACROSCOPIC AND MICROSCOPIC W/CULTURE REFLEX (Completed)    LIPID PANEL (Completed)    MICROALBUMIN/CREATININE RATIO, URINE, RANDOM (Completed)    THYROID STIMULATING HORMONE WITH FREE T4 REFLEX (Completed)    Hyperlipidemia    Relevant Orders    CBC/DIFF (Completed)    COMPREHENSIVE METABOLIC PNL, FASTING (Completed)    HGA1C  (HEMOGLOBIN A1C WITH EST AVG GLUCOSE) (Completed)    URINALYSIS, MACROSCOPIC AND MICROSCOPIC W/CULTURE REFLEX (Completed)    LIPID PANEL (Completed)    MICROALBUMIN/CREATININE RATIO, URINE, RANDOM (Completed)    THYROID STIMULATING HORMONE WITH FREE T4 REFLEX (Completed)     Other Visit Diagnoses       Bilateral shoulder pain, unspecified chronicity    -  Primary    Bursitis of right shoulder  Labs obtained in office  Kenalog injection given right shoulder for bursitis patient to monitor blood sugar closely-glucose obtained in office prior to injection noted to be stable  Pennsaid topically shoulder areas as directed p.r.n.   Meds refilled as noted   B12 shot given  Continue blood pressure/blood sugar monitoring outpatient along with diet  Refuses colonoscopy  To follow-up with nephrologist/urologist /neurologist/neurosurgeon  as scheduled   Post-Discharge Follow Up Appointments       Wednesday Aug 05, 2023    Return Patient Visit with Eyvonne Mechanic, PA-C at 10:30 AM      Friday Aug 07, 2023    Return Patient Visit with Cydney Ok, NP at  1:00 PM      Wednesday Sep 02, 2023    Return Patient Visit with Eyvonne Mechanic, PA-C at 10:30 AM      Friday Oct 09, 2023    Return Patient Visit with Arlana Hove, DO at  2:00 PM      Monday Apr 25, 2024    Return Telephone Visit with Eyvonne Mechanic, PA-C at  1:30 PM      Internal Medicine, Building A  Building [facility]  [address]  [place] [address]  [phone] Nephrology, [facility]  [facility] [place]  [address]  [place] [address]  [phone] Urology, [facility] Professional [place], [place]  [address]  [place] [address]  [phone]              Seek medical attention for new or worsening symptoms.  Patient has been seen in this clinic within the last 3 years.     Amy Hyman Bible, PA-C        I personally reviewed the documentation of care provided by the certified  physician assistant and I agree with her medical decision making, Weyman Pedro, D.O.     This note was partially created using MModal Fluency Direct system (voice recognition software) and is inherently subject to errors including those of syntax and "sound-alike" substitutions which may escape proofreading.  In such instances, original meaning may be extrapolated by contextual derivation.

## 2023-07-08 NOTE — Telephone Encounter (Signed)
Patient is here for routine appointment for medication refills

## 2023-07-08 NOTE — Nursing Note (Signed)
Patient is here for 21m CDM.Marland Kitchen pt has complaints of pain in shoulders all the way to elbows and also stated she feels lumps on the tops of her arms... pt requesting refill for seizure med?? Unsure of name pt stated she believes it starts with an I.

## 2023-07-09 ENCOUNTER — Telehealth (RURAL_HEALTH_CENTER): Payer: Self-pay | Admitting: Physician Assistant

## 2023-07-09 ENCOUNTER — Other Ambulatory Visit (INDEPENDENT_AMBULATORY_CARE_PROVIDER_SITE_OTHER): Payer: Self-pay | Admitting: Physician Assistant

## 2023-07-09 ENCOUNTER — Telehealth (INDEPENDENT_AMBULATORY_CARE_PROVIDER_SITE_OTHER): Payer: Self-pay | Admitting: Physician Assistant

## 2023-07-09 LAB — HGA1C (HEMOGLOBIN A1C WITH EST AVG GLUCOSE): HEMOGLOBIN A1C: 9.5 % — ABNORMAL HIGH (ref 4.0–6.0)

## 2023-07-09 MED ORDER — ROSUVASTATIN 10 MG TABLET
20.0000 mg | ORAL_TABLET | Freq: Every evening | ORAL | 1 refills | Status: DC
Start: 2023-07-09 — End: 2023-07-09

## 2023-07-09 MED ORDER — ROSUVASTATIN 20 MG TABLET
20.0000 mg | ORAL_TABLET | Freq: Every evening | ORAL | 4 refills | Status: DC
Start: 2023-07-09 — End: 2024-05-18

## 2023-07-09 MED ORDER — INSULIN LISPRO (U-100) 100 UNIT/ML SUBCUTANEOUS PEN: PEN_INJECTOR | SUBCUTANEOUS | 2 refills | Status: DC

## 2023-07-09 MED ORDER — INSULIN GLARGINE (U-300) CONC. 300 UNIT/ML (3 ML) SUBCUTANEOUS PEN
30.0000 [IU] | PEN_INJECTOR | Freq: Every evening | SUBCUTANEOUS | 2 refills | Status: DC
Start: 2023-07-09 — End: 2023-10-13

## 2023-07-09 NOTE — Telephone Encounter (Signed)
-----   Message from Eyvonne Mechanic, [place] sent at 07/09/2023  9:51 AM EDT -----  Increase glargine 30 units nightly  Use meal time /Humalog 5 units w meals  Increase Crestor 20 mg q hs  Must watch diet!!!!!

## 2023-07-29 ENCOUNTER — Emergency Department (HOSPITAL_COMMUNITY): Payer: Medicare Other

## 2023-07-29 ENCOUNTER — Other Ambulatory Visit: Payer: Self-pay

## 2023-07-29 ENCOUNTER — Inpatient Hospital Stay (HOSPITAL_COMMUNITY): Payer: Medicare Other | Admitting: Internal Medicine

## 2023-07-29 ENCOUNTER — Inpatient Hospital Stay
Admission: EM | Admit: 2023-07-29 | Discharge: 2023-08-06 | DRG: 312 | Disposition: A | Discharge status: Skilled Nursing Facility | Payer: Medicare Other | Attending: Internal Medicine | Admitting: Internal Medicine

## 2023-07-29 ENCOUNTER — Telehealth: Payer: Medicare Other | Admitting: Internal Medicine

## 2023-07-29 DIAGNOSIS — I639 Cerebral infarction, unspecified: Secondary | ICD-10-CM

## 2023-07-29 DIAGNOSIS — I951 Orthostatic hypotension: Principal | ICD-10-CM | POA: Diagnosis present

## 2023-07-29 DIAGNOSIS — Z794 Long term (current) use of insulin: Secondary | ICD-10-CM

## 2023-07-29 DIAGNOSIS — E875 Hyperkalemia: Secondary | ICD-10-CM | POA: Diagnosis not present

## 2023-07-29 DIAGNOSIS — M797 Fibromyalgia: Secondary | ICD-10-CM | POA: Diagnosis present

## 2023-07-29 DIAGNOSIS — G40909 Epilepsy, unspecified, not intractable, without status epilepticus: Secondary | ICD-10-CM | POA: Diagnosis present

## 2023-07-29 DIAGNOSIS — G459 Transient cerebral ischemic attack, unspecified: Secondary | ICD-10-CM | POA: Diagnosis present

## 2023-07-29 DIAGNOSIS — E1122 Type 2 diabetes mellitus with diabetic chronic kidney disease: Secondary | ICD-10-CM | POA: Diagnosis present

## 2023-07-29 DIAGNOSIS — I2119 ST elevation (STEMI) myocardial infarction involving other coronary artery of inferior wall: Secondary | ICD-10-CM

## 2023-07-29 DIAGNOSIS — Z87440 Personal history of urinary (tract) infections: Secondary | ICD-10-CM

## 2023-07-29 DIAGNOSIS — Z8616 Personal history of COVID-19: Secondary | ICD-10-CM

## 2023-07-29 DIAGNOSIS — Z8673 Personal history of transient ischemic attack (TIA), and cerebral infarction without residual deficits: Secondary | ICD-10-CM

## 2023-07-29 DIAGNOSIS — Z7984 Long term (current) use of oral hypoglycemic drugs: Secondary | ICD-10-CM

## 2023-07-29 DIAGNOSIS — N182 Chronic kidney disease, stage 2 (mild): Secondary | ICD-10-CM | POA: Diagnosis present

## 2023-07-29 DIAGNOSIS — L080 Pyoderma: Secondary | ICD-10-CM | POA: Diagnosis present

## 2023-07-29 DIAGNOSIS — G43909 Migraine, unspecified, not intractable, without status migrainosus: Secondary | ICD-10-CM | POA: Diagnosis present

## 2023-07-29 DIAGNOSIS — I10 Essential (primary) hypertension: Secondary | ICD-10-CM | POA: Diagnosis present

## 2023-07-29 DIAGNOSIS — E1165 Type 2 diabetes mellitus with hyperglycemia: Secondary | ICD-10-CM | POA: Diagnosis present

## 2023-07-29 DIAGNOSIS — E782 Mixed hyperlipidemia: Secondary | ICD-10-CM | POA: Diagnosis present

## 2023-07-29 DIAGNOSIS — H409 Unspecified glaucoma: Secondary | ICD-10-CM | POA: Diagnosis present

## 2023-07-29 DIAGNOSIS — R197 Diarrhea, unspecified: Secondary | ICD-10-CM | POA: Diagnosis present

## 2023-07-29 DIAGNOSIS — I129 Hypertensive chronic kidney disease with stage 1 through stage 4 chronic kidney disease, or unspecified chronic kidney disease: Secondary | ICD-10-CM | POA: Diagnosis present

## 2023-07-29 DIAGNOSIS — R42 Dizziness and giddiness: Secondary | ICD-10-CM

## 2023-07-29 DIAGNOSIS — R54 Age-related physical debility: Secondary | ICD-10-CM | POA: Diagnosis present

## 2023-07-29 DIAGNOSIS — D72829 Elevated white blood cell count, unspecified: Secondary | ICD-10-CM | POA: Diagnosis present

## 2023-07-29 DIAGNOSIS — Z79899 Other long term (current) drug therapy: Secondary | ICD-10-CM

## 2023-07-29 DIAGNOSIS — R001 Bradycardia, unspecified: Secondary | ICD-10-CM | POA: Diagnosis present

## 2023-07-29 LAB — CBC WITH DIFF
BASOPHIL #: 0.1 10*3/uL (ref 0.00–0.10)
BASOPHIL %: 1 % (ref 0–1)
EOSINOPHIL #: 0.2 10*3/uL (ref 0.00–0.50)
EOSINOPHIL %: 3 % (ref 1–7)
HCT: 43.2 % — ABNORMAL HIGH (ref 31.2–41.9)
HGB: 14.6 g/dL — ABNORMAL HIGH (ref 10.9–14.3)
LYMPHOCYTE #: 2.2 10*3/uL (ref 1.00–3.00)
LYMPHOCYTE %: 30 % (ref 16–44)
MCH: 32.7 pg (ref 24.7–32.8)
MCHC: 33.7 g/dL (ref 32.3–35.6)
MCV: 97 fL — ABNORMAL HIGH (ref 75.5–95.3)
MONOCYTE #: 0.4 10*3/uL (ref 0.30–1.00)
MONOCYTE %: 6 % (ref 5–13)
MPV: 10 fL (ref 7.9–10.8)
NEUTROPHIL #: 4.5 10*3/uL (ref 1.85–7.80)
NEUTROPHIL %: 61 % (ref 43–77)
PLATELETS: 184 10*3/uL (ref 140–440)
RBC: 4.46 10*6/uL (ref 3.63–4.92)
RDW: 14 % (ref 12.3–17.7)
WBC: 7.5 10*3/uL (ref 3.8–11.8)

## 2023-07-29 LAB — LACTIC ACID LEVEL W/ REFLEX FOR LEVEL >2.0: LACTIC ACID: 1.6 mmol/L (ref 0.5–2.2)

## 2023-07-29 LAB — COMPREHENSIVE METABOLIC PANEL, NON-FASTING
ALBUMIN/GLOBULIN RATIO: 1.1 (ref 0.8–1.4)
ALBUMIN: 3.9 g/dL (ref 3.5–5.7)
ALKALINE PHOSPHATASE: 69 U/L (ref 34–104)
ALT (SGPT): 16 U/L (ref 7–52)
ANION GAP: 11 mmol/L (ref 4–13)
AST (SGOT): 16 U/L (ref 13–39)
BILIRUBIN TOTAL: 0.4 mg/dL (ref 0.3–1.0)
BUN/CREA RATIO: 14 (ref 6–22)
BUN: 14 mg/dL (ref 7–25)
CALCIUM, CORRECTED: 9.5 mg/dL (ref 8.9–10.8)
CALCIUM: 9.4 mg/dL (ref 8.6–10.3)
CHLORIDE: 106 mmol/L (ref 98–107)
CO2 TOTAL: 23 mmol/L (ref 21–31)
CREATININE: 1 mg/dL (ref 0.60–1.30)
ESTIMATED GFR: 59 mL/min/{1.73_m2} — ABNORMAL LOW (ref >59)
GLOBULIN: 3.4 (ref 2.9–5.4)
GLUCOSE: 123 mg/dL — ABNORMAL HIGH (ref 74–109)
OSMOLALITY, CALCULATED: 281 mOsm/kg (ref 270–290)
POTASSIUM: 4.5 mmol/L (ref 3.5–5.1)
PROTEIN TOTAL: 7.3 g/dL (ref 6.4–8.9)
SODIUM: 140 mmol/L (ref 136–145)

## 2023-07-29 LAB — URINALYSIS, MACROSCOPIC
BILIRUBIN: NEGATIVE mg/dL
BLOOD: NEGATIVE mg/dL
GLUCOSE: >1000 mg/dL — AB
KETONES: NEGATIVE mg/dL
LEUKOCYTES: NEGATIVE WBCs/uL
NITRITE: NEGATIVE
PH: 6 (ref 5.0–9.0)
PROTEIN: NEGATIVE mg/dL
SPECIFIC GRAVITY: 1.024 (ref 1.002–1.030)
UROBILINOGEN: NORMAL mg/dL

## 2023-07-29 LAB — URINALYSIS, MICROSCOPIC
NON-SQUAMOUS EPITHELIAL CELLS URINE: <1 /hpf (ref <=1)
RBCS: <1 /hpf (ref <4)
SQUAMOUS EPITHELIAL: 1 /hpf (ref <28)
WBCS: 2 /hpf (ref <6)

## 2023-07-29 LAB — ECG 12 LEAD
Atrial Rate: 69 {beats}/min
Calculated P Axis: 7 degrees
Calculated R Axis: -10 degrees
Calculated T Axis: 17 degrees
PR Interval: 140 ms
QRS Duration: 74 ms
QT Interval: 370 ms
QTC Calculation: 396 ms
Ventricular rate: 69 {beats}/min

## 2023-07-29 LAB — COVID-19, FLU A/B, RSV RAPID BY PCR
INFLUENZA VIRUS TYPE A: NOT DETECTED
INFLUENZA VIRUS TYPE B: NOT DETECTED
RESPIRATORY SYNCTIAL VIRUS (RSV): NOT DETECTED
SARS-CoV-2: NOT DETECTED

## 2023-07-29 LAB — PT/INR
INR: 1.03 (ref 0.84–1.10)
PROTHROMBIN TIME: 12 seconds (ref 9.8–12.7)

## 2023-07-29 LAB — POC BLOOD GLUCOSE (RESULTS)
GLUCOSE, POC: 127 mg/dl — ABNORMAL HIGH (ref 70–100)
GLUCOSE, POC: 193 mg/dl — ABNORMAL HIGH (ref 70–100)

## 2023-07-29 LAB — TROPONIN-I: TROPONIN I: 3 ng/L (ref <15)

## 2023-07-29 LAB — MAGNESIUM: MAGNESIUM: 1.8 mg/dL — ABNORMAL LOW (ref 1.9–2.7)

## 2023-07-29 LAB — THYROID STIMULATING HORMONE (SENSITIVE TSH): TSH: 1.296 u[IU]/mL (ref 0.450–5.330)

## 2023-07-29 LAB — FOLATE: FOLATE: >23.8 ng/mL (ref 5.9–24.4)

## 2023-07-29 MED ORDER — DOCUSATE SODIUM 100 MG CAPSULE
100.0000 mg | ORAL_CAPSULE | Freq: Two times a day (BID) | ORAL | Status: DC | PRN
Start: 2023-07-29 — End: 2023-08-06

## 2023-07-29 MED ORDER — ATORVASTATIN 40 MG TABLET
40.0000 mg | ORAL_TABLET | Freq: Every evening | ORAL | Status: DC
Start: 2023-07-29 — End: 2023-08-06
  Administered 2023-07-29 – 2023-08-05 (×8): 40 mg via ORAL
  Filled 2023-07-29 (×8): qty 1

## 2023-07-29 MED ORDER — DIAZEPAM 5 MG TABLET
5.0000 mg | ORAL_TABLET | Freq: Every day | ORAL | Status: DC
Start: 2023-07-29 — End: 2023-08-06

## 2023-07-29 MED ORDER — HYDROCODONE 7.5 MG-ACETAMINOPHEN 325 MG TABLET
1.0000 | ORAL_TABLET | Freq: Four times a day (QID) | ORAL | Status: DC | PRN
Start: 2023-07-29 — End: 2023-08-06
  Administered 2023-07-31 – 2023-08-06 (×8): 1 via ORAL
  Filled 2023-07-29 (×8): qty 1

## 2023-07-29 MED ORDER — SODIUM CHLORIDE 0.9 % INTRAVENOUS SOLUTION
INTRAVENOUS | Status: AC
Start: 2023-07-29 — End: 2023-07-31

## 2023-07-29 MED ORDER — ACETAMINOPHEN 325 MG TABLET
650.0000 mg | ORAL_TABLET | ORAL | Status: DC | PRN
Start: 2023-07-29 — End: 2023-08-06
  Administered 2023-07-30: 650 mg via ORAL
  Filled 2023-07-29: qty 2

## 2023-07-29 MED ORDER — SODIUM CHLORIDE 0.9 % IV BOLUS
500.0000 mL | INJECTION | Status: AC
Start: 2023-07-29 — End: 2023-07-29
  Administered 2023-07-29: 500 mL via INTRAVENOUS
  Administered 2023-07-29: 0 mL via INTRAVENOUS

## 2023-07-29 MED ORDER — ENALAPRIL MALEATE 5 MG TABLET
10.0000 mg | ORAL_TABLET | Freq: Every day | ORAL | Status: DC
Start: 2023-07-29 — End: 2023-08-06
  Administered 2023-07-29 – 2023-08-06 (×9): 10 mg via ORAL
  Filled 2023-07-29 (×9): qty 2

## 2023-07-29 MED ORDER — INSULIN LISPRO 100 UNIT/ML SUB-Q SSIP VIAL
0.0000 [IU] | INJECTION | Freq: Four times a day (QID) | SUBCUTANEOUS | Status: DC
Start: 2023-07-29 — End: 2023-08-06
  Administered 2023-07-29: 4 [IU] via SUBCUTANEOUS
  Administered 2023-07-30 – 2023-07-31 (×5): 0 [IU] via SUBCUTANEOUS
  Administered 2023-07-31: 4 [IU] via SUBCUTANEOUS
  Administered 2023-07-31: 0 [IU] via SUBCUTANEOUS
  Administered 2023-07-31: 7 [IU] via SUBCUTANEOUS
  Administered 2023-07-31: 0 [IU] via SUBCUTANEOUS
  Administered 2023-08-01: 4 [IU] via SUBCUTANEOUS
  Administered 2023-08-01: 7 [IU] via SUBCUTANEOUS
  Administered 2023-08-01: 4 [IU] via SUBCUTANEOUS
  Administered 2023-08-01: 0 [IU] via SUBCUTANEOUS
  Administered 2023-08-02: 7 [IU] via SUBCUTANEOUS
  Administered 2023-08-02: 10 [IU] via SUBCUTANEOUS
  Administered 2023-08-02 – 2023-08-03 (×3): 0 [IU] via SUBCUTANEOUS
  Administered 2023-08-03: 4 [IU] via SUBCUTANEOUS
  Administered 2023-08-03: 0 [IU] via SUBCUTANEOUS
  Administered 2023-08-03: 4 [IU] via SUBCUTANEOUS
  Administered 2023-08-04: 7 [IU] via SUBCUTANEOUS
  Administered 2023-08-04 (×2): 4 [IU] via SUBCUTANEOUS
  Administered 2023-08-04: 0 [IU] via SUBCUTANEOUS
  Administered 2023-08-05: 10 [IU] via SUBCUTANEOUS
  Administered 2023-08-05: 7 [IU] via SUBCUTANEOUS
  Administered 2023-08-05: 4 [IU] via SUBCUTANEOUS
  Administered 2023-08-05: 7 [IU] via SUBCUTANEOUS
  Administered 2023-08-06: 0 [IU] via SUBCUTANEOUS
  Administered 2023-08-06: 14 [IU] via SUBCUTANEOUS
  Administered 2023-08-06: 7 [IU] via SUBCUTANEOUS
  Filled 2023-07-29: qty 4
  Filled 2023-07-29: qty 7
  Filled 2023-07-29: qty 14
  Filled 2023-07-29: qty 7
  Filled 2023-07-29 (×2): qty 4
  Filled 2023-07-29 (×2): qty 7
  Filled 2023-07-29 (×2): qty 4
  Filled 2023-07-29 (×2): qty 7
  Filled 2023-07-29: qty 4
  Filled 2023-07-29: qty 5
  Filled 2023-07-29 (×3): qty 4

## 2023-07-29 MED ORDER — HEPARIN (PORCINE) 5,000 UNIT/ML INJECTION SOLUTION
5000.0000 [IU] | Freq: Three times a day (TID) | INTRAMUSCULAR | Status: DC
Start: 2023-07-29 — End: 2023-08-06
  Administered 2023-07-29 – 2023-08-06 (×24): 5000 [IU] via SUBCUTANEOUS
  Filled 2023-07-29 (×24): qty 1

## 2023-07-29 MED ORDER — MAGNESIUM SULFATE 1 GRAM/100 ML IN DEXTROSE 5 % INTRAVENOUS PIGGYBACK
INJECTION | INTRAVENOUS | Status: AC
Start: 2023-07-29 — End: 2023-07-29
  Filled 2023-07-29: qty 100

## 2023-07-29 MED ORDER — ONDANSETRON HCL (PF) 4 MG/2 ML INJECTION SOLUTION
4.0000 mg | Freq: Four times a day (QID) | INTRAMUSCULAR | Status: DC | PRN
Start: 2023-07-29 — End: 2023-08-06
  Administered 2023-08-01: 4 mg via INTRAVENOUS
  Filled 2023-07-29: qty 2

## 2023-07-29 MED ORDER — DEXTROSE 50 % IN WATER (D50W) INTRAVENOUS SYRINGE
12.5000 g | INJECTION | INTRAVENOUS | Status: DC | PRN
Start: 2023-07-29 — End: 2023-08-06

## 2023-07-29 MED ORDER — SODIUM CHLORIDE 0.9 % (FLUSH) INJECTION SYRINGE
3.0000 mL | INJECTION | INTRAMUSCULAR | Status: DC | PRN
Start: 2023-07-29 — End: 2023-08-06

## 2023-07-29 MED ORDER — DAPAGLIFLOZIN PROPANEDIOL 10 MG TABLET
10.0000 mg | ORAL_TABLET | Freq: Every day | ORAL | Status: DC
Start: 2023-07-30 — End: 2023-08-06
  Administered 2023-07-30 – 2023-08-06 (×8): 10 mg via ORAL
  Filled 2023-07-29 (×9): qty 1

## 2023-07-29 MED ORDER — VENLAFAXINE ER 37.5 MG CAPSULE,EXTENDED RELEASE 24 HR
37.5000 mg | ORAL_CAPSULE | Freq: Every evening | ORAL | Status: DC
Start: 2023-07-29 — End: 2023-08-06
  Administered 2023-07-29 – 2023-08-05 (×8): 37.5 mg via ORAL
  Filled 2023-07-29 (×9): qty 1

## 2023-07-29 MED ORDER — GLUCAGON 1 MG/ML SOLUTION FOR INJECTION
1.0000 mg | Freq: Once | INTRAMUSCULAR | Status: DC | PRN
Start: 2023-07-29 — End: 2023-08-06

## 2023-07-29 MED ORDER — SUMATRIPTAN 50 MG TABLET
50.0000 mg | ORAL_TABLET | Freq: Once | ORAL | Status: DC | PRN
Start: 2023-07-29 — End: 2023-08-06

## 2023-07-29 MED ORDER — LATANOPROST 0.005 % EYE DROPS
1.0000 [drp] | Freq: Every evening | OPHTHALMIC | Status: DC
Start: 2023-07-29 — End: 2023-08-06
  Administered 2023-07-29 – 2023-08-05 (×8): 1 [drp] via OPHTHALMIC
  Filled 2023-07-29 (×3): qty 2.5

## 2023-07-29 MED ORDER — MAGNESIUM OXIDE 400 MG (241.3 MG MAGNESIUM) TABLET
800.0000 mg | ORAL_TABLET | Freq: Every day | ORAL | Status: DC
Start: 2023-07-29 — End: 2023-08-06
  Administered 2023-07-29 – 2023-08-06 (×9): 800 mg via ORAL
  Filled 2023-07-29 (×9): qty 2

## 2023-07-29 MED ORDER — LEVETIRACETAM ER 500 MG TABLET,EXTENDED RELEASE 24 HR
1.0000 | ORAL_TABLET | Freq: Every evening | ORAL | Status: DC
Start: 2023-07-29 — End: 2023-08-06
  Administered 2023-07-29 – 2023-08-02 (×5): 0 mg via ORAL
  Administered 2023-08-03 – 2023-08-05 (×3): 500 mg via ORAL
  Filled 2023-07-29: qty 1

## 2023-07-29 MED ORDER — DEXTROSE 40 % ORAL GEL
15.0000 g | ORAL | Status: DC | PRN
Start: 2023-07-29 — End: 2023-08-06

## 2023-07-29 MED ORDER — MAGNESIUM SULFATE 1 GRAM/100 ML IN DEXTROSE 5 % INTRAVENOUS PIGGYBACK
1.0000 g | INJECTION | INTRAVENOUS | Status: AC
Start: 2023-07-29 — End: 2023-07-29
  Administered 2023-07-29: 0 g via INTRAVENOUS
  Administered 2023-07-29: 1 g via INTRAVENOUS

## 2023-07-29 MED ORDER — ERGOCALCIFEROL (VITAMIN D2) 1,250 MCG (50,000 UNIT) CAPSULE
50000.0000 [IU] | ORAL_CAPSULE | ORAL | Status: DC
Start: 2023-08-03 — End: 2023-08-06
  Administered 2023-08-03: 50000 [IU] via ORAL
  Filled 2023-07-29: qty 1

## 2023-07-29 MED ORDER — SODIUM CHLORIDE 0.9 % (FLUSH) INJECTION SYRINGE
3.0000 mL | INJECTION | Freq: Three times a day (TID) | INTRAMUSCULAR | Status: DC
Start: 2023-07-29 — End: 2023-08-06
  Administered 2023-07-29 – 2023-08-03 (×17): 3 mL
  Administered 2023-08-04 (×2): 0 mL
  Administered 2023-08-04: 3 mL
  Administered 2023-08-05: 0 mL
  Administered 2023-08-05 – 2023-08-06 (×3): 3 mL
  Administered 2023-08-06: 0 mL

## 2023-07-29 MED ORDER — MECLIZINE 25 MG TABLET
12.5000 mg | ORAL_TABLET | Freq: Three times a day (TID) | ORAL | Status: DC | PRN
Start: 2023-07-29 — End: 2023-08-02

## 2023-07-29 NOTE — Care Plan (Addendum)
Patient has a dry cough, afebrile, VSS.  She needs a stool and urine specimen.  Hat is in the [facility].  Care is ongoing.  Problem: Adult Inpatient Plan of Care  Goal: Plan of Care Review  Outcome: Ongoing (see interventions/notes)  Goal: Patient-Specific Goal (Individualized)  Outcome: Ongoing (see interventions/notes)  Goal: Absence of Hospital-Acquired Illness or Injury  Outcome: Ongoing (see interventions/notes)  Goal: Optimal Comfort and Wellbeing  Outcome: Ongoing (see interventions/notes)  Goal: Rounds/Family Conference  Outcome: Ongoing (see interventions/notes)     Problem: Health Knowledge, Opportunity to Enhance (Adult,Obstetrics,Pediatric)  Goal: Knowledgeable about Health Subject/Topic  Description: Patient will demonstrate the desired outcomes by discharge/transition of care.  Outcome: Ongoing (see interventions/notes)     Problem: Fluid Volume Excess  Goal: Fluid Balance  Outcome: Ongoing (see interventions/notes)

## 2023-07-29 NOTE — ED Provider Notes (Signed)
Emergency Medicine      Name: Grace Hoffman  Age and Gender: 72 y.o. female  Date of Birth: 1951-07-10  MRN: [id number]  PCP: Eyvonne Mechanic, PA-C    CC:  Chief Complaint   Patient presents with    Diarrhea    Weakness    Fever    Cough       HPI:  Grace Hoffman is a 72 y.o. White female who presents to the ER with cough and sweats for 1 week, generalized weakness and unsteady gait for the past 3 days, diarrhea and nausea since yesterday.  She notes 1 episode of diarrhea yesterday and 2 today. Denies vomiting, chest pain or dyspnea. Patient also states her primary care provider has her on metformin, glimepiride, Toujeo as well as Humalog sliding scale.  She says she checked her blood sugar this morning and it was 50, so she drank some orange juice.  She was supposed to reportedly have an appointment with Dr. Aretha Parrot today as she has a history of intermittent vertigo but he recommended she come to the ER for evaluation. She also reports some itching skin lesions of her chest and back for the past 2 days.    Below pertinent information reviewed with patient:  Past Medical History:   Diagnosis Date    Anxiety     Chronic back pain     COVID-19 vaccine series completed     Depression     Diabetes mellitus, type 2 (CMS HCC)     Fibromyalgia affecting multiple sites     Hypertension     Hypomagnesemia     Insomnia disorder     Migraine     Mixed hyperlipidemia     Orthostatic hypotension     Osteoarthritis     TIA (transient ischemic attack)     Unspecified glaucoma(365.9)     Vitamin D deficiency            Allergies   Allergen Reactions    Codeine Nausea/ Vomiting    Amoxicillin Diarrhea    Macrobid [Nitrofurantoin Monohyd/M-Cryst] Nausea/ Vomiting       Past Surgical History:   Procedure Laterality Date    HX CHOLECYSTECTOMY      HX KNEE REPLACMENT Left     HX ROTATOR CUFF REPAIR Left     SKIN CANCER EXCISION      forehead removed        Social History     Socioeconomic History    Marital status: Married    Number of  children: 1    Years of education: 11   Tobacco Use    Smoking status: Never    Smokeless tobacco: Never   Vaping Use    Vaping status: Never Used   Substance and Sexual Activity    Alcohol use: Never    Drug use: Never    Sexual activity: Not Currently     Partners: Male     Social Determinants of Health     Social Connections: Low Risk  (12/06/2022)    Social Connections     SDOH Social Isolation: 5 or more times a week       ROS:  No other overt positive review of systems are noted other than stated in the HPI.      Objective:    ED Triage Vitals [07/29/23 1220]   BP (Non-Invasive) 116/70   Heart Rate 73   Respiratory Rate 16   Temperature 36.6 C (97.8 F)  SpO2 97 %   Weight 89.4 kg (197 lb)   Height 1.651 m (5\' 5" )     Filed Vitals:    07/29/23 1515 07/29/23 1530 07/29/23 1545 07/29/23 1600   BP: 130/76 129/72 (!) 140/75 139/70   Pulse:       Resp:       Temp:       SpO2:  97% 100% 97%       Nursing notes and vital signs reviewed.    Constitutional - No acute distress.  Alert and Active.  HEENT - Normocephalic. PERRL. EOMI. Conjunctiva clear. Moist mucous membranes.   Neck - Trachea midline. No stridor. No hoarseness.  Cardiac - Regular rate and rhythm. No murmurs, rubs, or gallops.   Respiratory/Chest - Normal respiratory effort. Clear to auscultation bilaterally. No rales, wheezes or rhonchi. No chest tenderness.  Abdomen - Normal bowel sounds. Non-tender, soft, non-distended. No rebound or guarding.   Musculoskeletal - Good AROM. No clubbing, cyanosis or edema.  Skin - Hoffman and dry, Erythematous papules on the bilateral chest and near the posterior axilla bilaterally.  Neuro - Alert and oriented x 3. Cranial nerves II-XII are grossly intact.  Moving all extremities symmetrically. Normal finger to nose bilaterally. No nystagmus.  Psych - Normal mood and affect. Behavior is normal            Any pertinent labs and imaging obtained during this encounter reviewed below in MDM.    MDM/ED Course:      Medical  Decision Making  Patient presents to the ER with cough and sweats for 1 week, generalized weakness and unsteady gait for the past 3 days, diarrhea and nausea since yesterday.  She notes 1 episode of diarrhea yesterday and 2 today. Denies vomiting, chest pain or dyspnea. Patient also states her primary care provider has her on metformin, glimepiride, Toujeo as well as Humalog sliding scale.  She says she checked her blood sugar this morning and it was 50, so she drank some orange juice.  She was supposed to reportedly have an appointment with Dr. Aretha Parrot today as she has a history of intermittent vertigo but he recommended she come to the ER for evaluation. She also reports some itching skin lesions of her chest and back for the past 2 days.  Differential diagnoses include TIA/CVA, COVID, influenza, electrolyte abnormality, acute MI. patient underwent diagnostics with results as noted in ED course.  Due to her reported new unsteady gait, on-call neurologist was consulted who recommended admission for further evaluation.  Hospitalist was contacted    Amount and/or Complexity of Data Reviewed  Labs:  Decision-making details documented in ED Course.  Radiology: ordered. Decision-making details documented in ED Course.  ECG/medicine tests: independent interpretation performed.    Risk  Prescription drug management.  Decision regarding hospitalization.              ED Course as of 07/29/23 1628   Wed Jul 29, 2023   1318 XR CHEST PA AND LATERAL  NEGATIVE CHEST        1336 GLUCOSE, POC(!): 127   1337 RSV PCR: Not Detected   1337 INFLUENZA VIRUS TYPE B: Not Detected   1337 INFLUENZA VIRUS TYPE A: Not Detected   1337 SARS CORONAVIRUS 2 (SARS-CoV-2): Not Detected   1436 COMPREHENSIVE METABOLIC PANEL, NON-FASTING(!)  Unremarkable   1436 LACTIC ACID: 1.6   1436 TSH: 1.296   1436 TROPONIN-I: 3   1436 MAGNESIUM(!): 1.8   1436 PROTHROMBIN TIME: 12.0  1436 INR: 1.03   1437 CBC/DIFF(!)  Slightly elevated H&H, stable for the patient    1447 CT BRAIN WO IV CONTRAST  NO ACUTE FINDINGS   1531 Discussed with Dr Marry Guan who feels more advanced neuroimaging and testing are indicated. Will place recommendations in his note       Orders Placed This Encounter    ADULT ROUTINE BLOOD CULTURE, SET OF 2 ADULT BOTTLES (BACTERIA AND YEAST)    ADULT ROUTINE BLOOD CULTURE, SET OF 2 ADULT BOTTLES (BACTERIA AND YEAST)    ROUTINE STOOL CULTURE (INCLUDING E. COLI SHIGA TOXIN)    C. DIFFICILE PCR    XR CHEST PA AND LATERAL    CT BRAIN WO IV CONTRAST    MRI BRAIN WO CONTRAST    MRA INTRACRANIAL WO CONTRAST    MRA CAROTID-EXTRACRANIAL (NECK) WO CONTRAST    CBC/DIFF    COMPREHENSIVE METABOLIC PANEL, NON-FASTING    LACTIC ACID LEVEL W/ REFLEX FOR LEVEL >2.0    MAGNESIUM    PT/INR    THYROID STIMULATING HORMONE (SENSITIVE TSH)    TROPONIN-I    URINALYSIS, MACROSCOPIC AND MICROSCOPIC W/CULTURE REFLEX    COVID-19, FLU A/B, RSV RAPID BY PCR    CBC WITH DIFF    URINALYSIS, MACROSCOPIC    URINALYSIS, MICROSCOPIC    CBC/DIFF    COMPREHENSIVE METABOLIC PANEL, NON-FASTING    MAGNESIUM    CBC/DIFF    COMPREHENSIVE METABOLIC PANEL, NON-FASTING    MAGNESIUM    CBC/DIFF    COMPREHENSIVE METABOLIC PANEL, NON-FASTING    MAGNESIUM    VITAMIN B12    VITAMIN B6 (PYRIDOXAL 5-PHOSPHATE), PLASMA    FOLATE    HIV 1 AND 2 RAPID SCREEN    SYPHILIS SCREENING ALGORITHM WITH REFLEX, SERUM    VITAMIN B1 (THIAMIN), WHOLE BLOOD    OT EVAL & TREAT    PT EVALUATE AND TREAT    OXYGEN - NASAL CANNULA    ECG 12 LEAD    POC WHOLE BLOOD GLUCOSE    PERFORM POC WHOLE BLOOD GLUCOSE    TRANSTHORACIC ECHOCARDIOGRAM - ADULT    INSERT & MAINTAIN PERIPHERAL IV ACCESS    PERIPHERAL IV DRESSING CHANGE    PATIENT CLASS/LEVEL OF CARE DESIGNATION - PRN    NS bolus infusion 500 mL    magnesium sulfate 1 G in D5W 100 mL premix IVPB    diazePAM (VALIUM) tablet    Levetiracetam Tablet Sustained Release 24 hr 500 mg    dapagliflozin (FARXIGA) tablet    atorvastatin (LIPITOR) tablet    enalapril (VASOTEC) tablet    SUMAtriptan  (IMITREX) tablet    magnesium oxide (MAG-OX) 400mg  (241.3 mg elemental magnesium) tablet    HYDROcodone-acetaminophen (NORCO) 7.5 mg-325 mg per tablet    latanoprost (XALATAN) 0.005 % ophthalmic solution    venlafaxine (EFFEXOR XR) 24 hr extended release capsule    ergocalciferol-vitamin D2 (DRISDOL) capsule    heparin 5,000 unit/mL injection    acetaminophen (TYLENOL) tablet    docusate sodium (COLACE) capsule    ondansetron (ZOFRAN) 2 mg/mL injection    Correction/SSIP insulin lispro (HumaLOG) 100 units/mL injection    dextrose (GLUTOSE) 40% oral gel    dextrose 50% (0.5 g/mL) injection - syringe    glucagon (GLUCAGEN DIAGNOSTIC KIT) injection 1 mg    NS flush syringe    NS flush syringe    NS premix infusion         Impression:   Clinical Impression   TIA (transient ischemic attack) (  Primary)       Disposition: Admitted    / Maryagnes Amos PA-C  07/29/2023, 14:39  [facility]  Department of Emergency Medicine  [facility]    Portions of this note may have been dictated using voice recognition software.     -----------------------  Results for orders placed or performed during the hospital encounter of 07/29/23 (from the past 12 hour(s))   POC BLOOD GLUCOSE (RESULTS)   Result Value Ref Range    GLUCOSE, POC 127 (H) 70 - 100 mg/dl   NFAOZ-30, FLU A/B, RSV RAPID BY PCR   Result Value Ref Range    SARS-CoV-2 Not Detected Not Detected    INFLUENZA VIRUS TYPE A Not Detected Not Detected    INFLUENZA VIRUS TYPE B Not Detected Not Detected    RESPIRATORY SYNCTIAL VIRUS (RSV) Not Detected Not Detected   COMPREHENSIVE METABOLIC PANEL, NON-FASTING   Result Value Ref Range    SODIUM 140 136 - 145 mmol/L    POTASSIUM 4.5 3.5 - 5.1 mmol/L    CHLORIDE 106 98 - 107 mmol/L    CO2 TOTAL 23 21 - 31 mmol/L    ANION GAP 11 4 - 13 mmol/L    BUN 14 7 - 25 mg/dL    CREATININE 8.65 7.84 - 1.30 mg/dL    BUN/CREA RATIO 14 6 - 22    ESTIMATED GFR 59 (L) >59 mL/min/1.81m^2    ALBUMIN 3.9 3.5 - 5.7 g/dL    CALCIUM  9.4 8.6 - 69.6 mg/dL    GLUCOSE 295 (H) 74 - 109 mg/dL    ALKALINE PHOSPHATASE 69 34 - 104 U/L    ALT (SGPT) 16 7 - 52 U/L    AST (SGOT) 16 13 - 39 U/L    BILIRUBIN TOTAL 0.4 0.3 - 1.0 mg/dL    PROTEIN TOTAL 7.3 6.4 - 8.9 g/dL    ALBUMIN/GLOBULIN RATIO 1.1 0.8 - 1.4    OSMOLALITY, CALCULATED 281 270 - 290 mOsm/kg    CALCIUM, CORRECTED 9.5 8.9 - 10.8 mg/dL    GLOBULIN 3.4 2.9 - 5.4   LACTIC ACID LEVEL W/ REFLEX FOR LEVEL >2.0   Result Value Ref Range    LACTIC ACID 1.6 0.5 - 2.2 mmol/L   MAGNESIUM   Result Value Ref Range    MAGNESIUM 1.8 (L) 1.9 - 2.7 mg/dL   PT/INR   Result Value Ref Range    PROTHROMBIN TIME 12.0 9.8 - 12.7 seconds    INR 1.03 0.84 - 1.10   THYROID STIMULATING HORMONE (SENSITIVE TSH)   Result Value Ref Range    TSH 1.296 0.450 - 5.330 uIU/mL   TROPONIN-I   Result Value Ref Range    TROPONIN I 3 <15 ng/L   CBC WITH DIFF   Result Value Ref Range    WBC 7.5 3.8 - 11.8 x10^3/uL    RBC 4.46 3.63 - 4.92 x10^6/uL    HGB 14.6 (H) 10.9 - 14.3 g/dL    HCT 28.4 (H) 13.2 - 41.9 %    MCV 97.0 (H) 75.5 - 95.3 fL    MCH 32.7 24.7 - 32.8 pg    MCHC 33.7 32.3 - 35.6 g/dL    RDW 44.0 10.2 - 72.5 %    PLATELETS 184 140 - 440 x10^3/uL    MPV 10.0 7.9 - 10.8 fL    NEUTROPHIL % 61 43 - 77 %    LYMPHOCYTE % 30 16 - 44 %    MONOCYTE %  6 5 - 13 %    EOSINOPHIL % 3 1 - 7 %    BASOPHIL % 1 0 - 1 %    NEUTROPHIL # 4.50 1.85 - 7.80 x10^3/uL    LYMPHOCYTE # 2.20 1.00 - 3.00 x10^3/uL    MONOCYTE # 0.40 0.30 - 1.00 x10^3/uL    EOSINOPHIL # 0.20 0.00 - 0.50 x10^3/uL    BASOPHIL # 0.10 0.00 - 0.10 x10^3/uL     CT BRAIN WO IV CONTRAST   Final Result   NO ACUTE FINDINGS         One or more dose reduction techniques were used (e.g., Automated exposure control, adjustment of the mA and/or kV according to patient size, use of iterative reconstruction technique).         Radiologist location ID: [id number]         XR CHEST PA AND LATERAL   Final Result   NEGATIVE CHEST         Radiologist location ID: [id number]

## 2023-07-29 NOTE — Telestroke Note (Signed)
[name], 72 y.o. female  MRN:  [id number]     Date of Admission:  (Not on file)  Date of Birth:  1951-02-25  Date of Service:  07/29/2023    Patient/Family consent for econsult- Yes  Location of Consultation:  [facility], [address] [place], [place] [address]  Consult Requested By Deveron Furlong  Time Spent: 45 mins    Reason for consult: dizziness  LKN 3 days ago  Outside window for TENECTEplase  Vessel imaging pending  Room spinning sensation. Gait instability.    Recommendations:   - meclizine 12.5 mg tid prn for 10 days  - orthostatic vitals  - Vit B12, Folate, Vit B6, Thiamine, HIV, syphilis  - MRI brain w/o con  - MRA H/N    - PT/OT eval  - ENT and vestibular rehab OP referral    Percell Miller, MD  07/29/2023, 15:39

## 2023-07-29 NOTE — ED Nurses Note (Signed)
Report called to 3E

## 2023-07-29 NOTE — H&P (Signed)
[place] [facility]ICINE [facility]    HOSPITALIST H&P    Grace Hoffman 72 y.o. female ED29/ED29   Date of Service: 07/29/2023    Date of Admission:  07/29/2023   PCP: Eyvonne Mechanic, PA-C Code Status:Full Code       Chief Complaint:  Dizziness with the nausea and vomiting    HPI:  72 year old female with multiple medical problems including diabetes mellitus hypertension hyperlipidemia prior TIA coming in with a 3 day history of unsteady gait.  Patient reports that this was followed by generalized weakness and loose bowel movements associated with nausea but no abdominal pain or vomiting. She reports having developed unsteady gait about 3 days ago on this morning she noted that her blood sugars low requiring orange juice to help improve her blood sugar. She denies any obvious focal weakness.  No chest pain or shortness on breath reported.    Patient reports that she was previously been told that she had a TIA as per her neurologist.    On arrival in the ED initial workup with CT head was unremarkable.  She was noted to have leukocytosis 14.6, creatinine 1.0 and tele Neurology was consulted for further recommendations.  In view of her symptoms hospitalist service called in for admission for further evaluation.        ED medications:   Medications Administered in the ED   NS bolus infusion 500 mL (500 mL Intravenous New Bag/New Syringe 07/29/23 1520)   magnesium sulfate 1 G in D5W 100 mL premix IVPB (0 g Intravenous Stopped 07/29/23 1550)         PMHx:    Past Medical History:   Diagnosis Date    Anxiety     Chronic back pain     COVID-19 vaccine series completed     Depression     Diabetes mellitus, type 2 (CMS HCC)     Fibromyalgia affecting multiple sites     Hypertension     Hypomagnesemia     Insomnia disorder     Migraine     Mixed hyperlipidemia     Orthostatic hypotension     Osteoarthritis     TIA (transient ischemic attack)     Unspecified glaucoma(365.9)     Vitamin D deficiency         PSHx:   Past Surgical  History:   Procedure Laterality Date    HX CHOLECYSTECTOMY      HX KNEE REPLACMENT Left     HX ROTATOR CUFF REPAIR Left     SKIN CANCER EXCISION      forehead removed          Allergies:    Allergies   Allergen Reactions    Codeine Nausea/ Vomiting    Amoxicillin Diarrhea    Macrobid [Nitrofurantoin Monohyd/M-Cryst] Nausea/ Vomiting    Social History  Social History     Tobacco Use    Smoking status: Never    Smokeless tobacco: Never   Vaping Use    Vaping status: Never Used   Substance Use Topics    Alcohol use: Never    Drug use: Never       Family History  Family Medical History:       Problem Relation (Age of Onset)    Kidney failure Mother    No Known Problems Sister, Brother, Half-Sister, Half-Brother, Maternal Aunt, Maternal Uncle, Paternal Aunt, Paternal Uncle, Maternal Grandmother, Maternal Grandfather, Paternal Grandmother, Paternal Grandfather, Daughter, Son    Respiratory Problems  Father               Home Meds:      Prior to Admission medications    Medication Sig Start Date End Date Taking? Authorizing Provider   d-mannose 500 mg Oral Capsule Take 1 Capsule (500 mg total) by mouth Twice daily   Yes Provider, Historical   diazePAM (VALIUM) 5 mg Oral Tablet Take 1 Tablet (5 mg total) by mouth Once a day 07/08/23  Yes Alvis, Amy, PA-C   diclofenac sodium (PENNSAID) 20 mg/gram /actuation(2 %) solution in metered-dose pump Apply 1 Application  topically Twice daily 07/08/23   Alvis, Amy, PA-C   empagliflozin (JARDIANCE) 25 mg Oral Tablet Take 1 Tablet (25 mg total) by mouth Once a day 05/13/23  Yes Alvis, Amy, PA-C   enalapril maleate (VASOTEC) 10 mg Oral Tablet Take 1 Tablet (10 mg total) by mouth Once a day for 180 days 05/13/23 11/09/23 Yes Alvis, Amy, PA-C   ergocalciferol, vitamin D2, (DRISDOL) 1,250 mcg (50,000 unit) Oral Capsule Take 1 Capsule (50,000 Units total) by mouth Every 7 days  Patient taking differently: Take 1 Capsule (50,000 Units total) by mouth Every 7 days On Monday 06/10/23  Yes Alvis,  Amy, PA-C   glimepiride (AMARYL) 4 mg Oral Tablet TAKE 1 TABLET(4 MG) BY MOUTH TWICE DAILY WITH A MEAL FOR DIABETES 06/10/23  Yes Alvis, Amy, PA-C   HYDROcodone-acetaminophen (NORCO) 7.5-325 mg Oral Tablet Take 1 Tablet by mouth Every 6 hours as needed for up to 30 days Indications: pain 07/08/23 08/07/23 Yes Bowling, Ladonna, DO   insulin glargine U-300 conc (TOUJEO MAX U-300 SOLOSTAR) 300 unit/mL (3 mL) Subcutaneous Insulin Pen Inject 30 Units under the skin Every night 07/09/23  Yes Alvis, Amy, PA-C   insulin lispro (HUMALOG KWIKPEN INSULIN) 100 unit/mL Subcutaneous Insulin Pen Use as directed sliding scale; also use 5 units with meals Indications: type 2 diabetes mellitus  Patient taking differently: Use as directed sliding scale; also use up to 5 units with meals Indications: type 2 diabetes mellitus 07/09/23  Yes Alvis, Amy, PA-C   latanoprost (XALATAN) 0.005 % Ophthalmic Drops Administer 1 Drop into the left eye Every night   Yes Provider, Historical   Levetiracetam 500 mg Oral Tablet Sustained Release 24 hr Take 1 Tablet (500 mg total) by mouth Every night for 90 days 03/17/22 07/29/23 Yes Alvis, Amy, PA-C   loratadine (CLARITIN) 10 mg Oral Tablet Take 1 Tablet (10 mg total) by mouth Once per day as needed 12/02/22  Yes Alvis, Amy, PA-C   magnesium oxide (MAG-OX) 400 mg Oral Tablet Take 2 Tablets (800 mg total) by mouth Once a day 12/24/22  Yes Alvis, Amy, PA-C   MetFORMIN (GLUCOPHAGE) 1,000 mg Oral Tablet Take 1 Tablet (1,000 mg total) by mouth Twice daily with food 06/10/23  Yes Alvis, Amy, PA-C   rosuvastatin (CRESTOR) 20 mg Oral Tablet Take 1 Tablet (20 mg total) by mouth Every evening 07/09/23  Yes Alvis, Amy, PA-C   solifenacin (VESICARE) 5 mg Oral Tablet Take 1 Tablet (5 mg total) by mouth Once a day 05/13/23  Yes Alvis, Amy, PA-C   SUMAtriptan (IMITREX) 50 mg Oral Tablet Take 1 Tablet (50 mg total) by mouth Once, as needed for Migraine TAKE 1 TABLET BY MOUTH AT ONSET OF HEADACHE. IF NO RELIEF MAY REPEAT 1 AFTER  AT LEAST 2 HOURS. MAX OF 4 TABS PER 24 HOURS 05/13/23  Yes Alvis, Amy, PA-C   venlafaxine (EFFEXOR XR) 37.5 mg Oral  Capsule, Sust. Release 24 hr Take 1 Capsule (37.5 mg total) by mouth Every night 06/10/23  Yes Alvis, Amy, PA-C   diazePAM (VALIUM) 5 mg Oral Tablet Take 1 Tablet (5 mg total) by mouth Once a day 06/10/23 07/08/23  Alvis, Amy, PA-C   famotidine (PEPCID) 20 mg Oral Tablet TAKE 1 TABLET BY MOUTH TWICE DAILY  Patient taking differently: Take 1 Tablet (20 mg total) by mouth Every evening 03/27/22 07/29/23  Alvis, Amy, PA-C   flash glucose scanning reader (FREESTYLE LIBRE 2 READER) Does not apply Misc 1 Each Every 14 days 05/13/23 07/29/23  Alvis, Amy, PA-C   flash glucose sensor (FREESTYLE LIBRE 2 SENSOR) Does not apply Kit 1 Each Every 14 days 05/13/23 07/29/23  Alvis, Amy, PA-C   HYDROcodone-acetaminophen (NORCO) 7.5-325 mg Oral Tablet Take 1 Tablet by mouth Every 6 hours as needed for up to 30 days Indications: pain 06/10/23 07/08/23  Weyman Pedro, DO   insulin glargine U-300 conc (TOUJEO MAX U-300 SOLOSTAR) 300 unit/mL (3 mL) Subcutaneous Insulin Pen Inject 20 Units under the skin Every night 03/18/23 07/09/23  Alvis, Amy, PA-C   insulin lispro (HUMALOG KWIKPEN INSULIN) 100 unit/mL Subcutaneous Insulin Pen Use as directed sliding scale Indications: type 2 diabetes mellitus 05/13/23 07/09/23  Alvis, Amy, PA-C   rosuvastatin (CRESTOR) 10 mg Oral Tablet TAKE 1 TABLET BY MOUTH EVERY NIGHT AT BEDTIME 04/21/22 07/09/23  Alvis, Amy, PA-C   rosuvastatin (CRESTOR) 10 mg Oral Tablet Take 2 Tablets (20 mg total) by mouth Every night 07/09/23 07/09/23  Alvis, Amy, PA-C          ROS:   General: No fever or chills. No weight changes, fatigue, weakness.   HEENT: No headaches, dizziness, changes in vision, changes in hearing, or difficulty swallowing.    Skin:  No rashes, erythema or bruises.   Cardiac: No chest pain, palpitations, or arrhythmia.    Respiratory:  Cough but no shortness of breath, or wheezing.  GI: Nausea or vomiting. No  abdominal pain.   Urinary: No dysuria, hematuria, or change in frequency.    Vascular: No edema.     Musculoskeletal: No muscle weakness, pain, or decreased range of motion.   Neurologic:  Dizziness, No loss of sensation, numbness or tingling.   Endocrine: No heat or cold intolerance or polydipsia.   Psychiatric: No insomnia, depression or anxiety.      Results for orders placed or performed during the hospital encounter of 07/29/23 (from the past 24 hour(s))   POC BLOOD GLUCOSE (RESULTS)   Result Value Ref Range    GLUCOSE, POC 127 (H) 70 - 100 mg/dl   [id number], FLU A/B, RSV RAPID BY PCR   Result Value Ref Range    SARS-CoV-2 Not Detected Not Detected    INFLUENZA VIRUS TYPE A Not Detected Not Detected    INFLUENZA VIRUS TYPE B Not Detected Not Detected    RESPIRATORY SYNCTIAL VIRUS (RSV) Not Detected Not Detected   ECG 12 LEAD   Result Value Ref Range    Ventricular rate 69 BPM    Atrial Rate 69 BPM    PR Interval 140 ms    QRS Duration 74 ms    QT Interval 370 ms    QTC Calculation 396 ms    Calculated P Axis 7 degrees    Calculated R Axis -10 degrees    Calculated T Axis 17 degrees   COMPREHENSIVE METABOLIC PANEL, NON-FASTING   Result Value Ref Range    SODIUM 140  136 - 145 mmol/L    POTASSIUM 4.5 3.5 - 5.1 mmol/L    CHLORIDE 106 98 - 107 mmol/L    CO2 TOTAL 23 21 - 31 mmol/L    ANION GAP 11 4 - 13 mmol/L    BUN 14 7 - 25 mg/dL    CREATININE 9.52 8.41 - 1.30 mg/dL    BUN/CREA RATIO 14 6 - 22    ESTIMATED GFR 59 (L) >59 mL/min/1.16m^2    ALBUMIN 3.9 3.5 - 5.7 g/dL    CALCIUM 9.4 8.6 - 32.4 mg/dL    GLUCOSE 401 (H) 74 - 109 mg/dL    ALKALINE PHOSPHATASE 69 34 - 104 U/L    ALT (SGPT) 16 7 - 52 U/L    AST (SGOT) 16 13 - 39 U/L    BILIRUBIN TOTAL 0.4 0.3 - 1.0 mg/dL    PROTEIN TOTAL 7.3 6.4 - 8.9 g/dL    ALBUMIN/GLOBULIN RATIO 1.1 0.8 - 1.4    OSMOLALITY, CALCULATED 281 270 - 290 mOsm/kg    CALCIUM, CORRECTED 9.5 8.9 - 10.8 mg/dL    GLOBULIN 3.4 2.9 - 5.4   LACTIC ACID LEVEL W/ REFLEX FOR LEVEL >2.0   Result Value  Ref Range    LACTIC ACID 1.6 0.5 - 2.2 mmol/L   MAGNESIUM   Result Value Ref Range    MAGNESIUM 1.8 (L) 1.9 - 2.7 mg/dL   PT/INR   Result Value Ref Range    PROTHROMBIN TIME 12.0 9.8 - 12.7 seconds    INR 1.03 0.84 - 1.10   THYROID STIMULATING HORMONE (SENSITIVE TSH)   Result Value Ref Range    TSH 1.296 0.450 - 5.330 uIU/mL   TROPONIN-I   Result Value Ref Range    TROPONIN I 3 <15 ng/L   CBC WITH DIFF   Result Value Ref Range    WBC 7.5 3.8 - 11.8 x10^3/uL    RBC 4.46 3.63 - 4.92 x10^6/uL    HGB 14.6 (H) 10.9 - 14.3 g/dL    HCT 02.7 (H) 25.3 - 41.9 %    MCV 97.0 (H) 75.5 - 95.3 fL    MCH 32.7 24.7 - 32.8 pg    MCHC 33.7 32.3 - 35.6 g/dL    RDW 66.4 40.3 - 47.4 %    PLATELETS 184 140 - 440 x10^3/uL    MPV 10.0 7.9 - 10.8 fL    NEUTROPHIL % 61 43 - 77 %    LYMPHOCYTE % 30 16 - 44 %    MONOCYTE % 6 5 - 13 %    EOSINOPHIL % 3 1 - 7 %    BASOPHIL % 1 0 - 1 %    NEUTROPHIL # 4.50 1.85 - 7.80 x10^3/uL    LYMPHOCYTE # 2.20 1.00 - 3.00 x10^3/uL    MONOCYTE # 0.40 0.30 - 1.00 x10^3/uL    EOSINOPHIL # 0.20 0.00 - 0.50 x10^3/uL    BASOPHIL # 0.10 0.00 - 0.10 x10^3/uL          Physical:  Filed Vitals:    07/29/23 1515 07/29/23 1530 07/29/23 1545 07/29/23 1600   BP: 130/76 129/72 (!) 140/75 139/70   Pulse:       Resp:       Temp:       SpO2:  97% 100% 97%      General: Patient is alert and oriented to person, place, and time. No acute distress. Communicates appropriately.   Head: Normocephalic and atraumatic.    Eyes:  Pupils equally round and react to light and accommodate. Extraocular movements intact.  Conjunctiva normal. Sclerae are normal.    Nose: Nasal passages clear. Mucosa moist.    Throat: Moist oral mucosa. No erythema or exudate of the pharynx. Clear oropharynx.    Neck: Supple. No cervical lymphadenopathy or supraclavicular nodes detected. Trachea midline   Heart: Regular rate and rhythm. S1 & S2 present. No S3 or S4. No rubs, gallops, or murmurs appreciated.  Radial and dorsalis pedis pulses +2/4 bilaterally.  Brisk  capillary refill.    Lungs: Clear to auscultation bilaterally with no wheezes or rales. Equal chest excursion.  No conversational dyspnea. No respiratory distress noted.   Abdomen: Soft, nontender, nondistended belly. Bowel sounds are present in all four quadrants. No rigidity.  No guarding.  No ascites.   Extremities: No edema, cyanosis, or clubbing. Grossly moves all extremities.    Skin: Warm and dry without lesions. No ecchymosis noted.    Neurologic:  Dizziness, Cranial nerves II through XII are grossly intact. Sensation to light touch is intact. Strength 5/5 in upper extremities and lower extremities bilaterally.    Genitourinary:  No urinary incontinence or Foley catheter   Psychiatric: Judgment and insight are intact. Mood and affect are appropriate for the situation.       Diagnostic studies:  CT head unremarkable    EKG interpretation:   Normal sinus rhythm    Assessments:  Active Hospital Problems   (*Primary Problem)    Diagnosis    *TIA (transient ischemic attack)    Diarrhea    Vertigo    Uncontrolled type 2 diabetes mellitus with hyperglycemia (CMS HCC)    Hypertension         Plan:  72 year old female with past medical history of TIA hypertension hyperlipidemia diabetes mellitus coming in with unsteady gait that was preceded with nausea vomiting and diarrhea.  Symptoms suggestive of probable CVA in view of her risk factors but rule out vasovagal and all medications as possible etiologies.    -CVA vs TIA rule out posterior stroke  Telemetry monitor  History of A-Fib, no  TPA candidate, no  Dysphagia screen:  Passed  PT/OT evaluation  F/up 2D echo, MRI and MRA head and neck  CT head was unremarkable for acute findings  Frequent neuro checks  Maintain SBP 150-160 mmHg  Follow-up lipid panel  Neurology consulted on arrival in the ED.  We will consider Cardiology consult and/or Holter monitor on d/c.    -Unsteady gait with dizziness, rule out BPPV  Orthostatic vital signs  Meclizine  We will check B12,  B6, folate, B1, syphilis and HIV.  Seizure precautions  IV fluids for hydration    -Diabetes mellitus  We will check hemoglobin A1c  Lantus with insulin sliding scale fingerstick blood sugar a.c. HS    -Hyperlipidemia  Lipid panel  Continue with home medications including statins    -Hypertension, controlled  Continue with Vasotec, to adjust medications as blood pressure responds.    -History of seizures  Continue with Keppra    -Age-related physical debility  PT OT evaluation for discharge disposition planning    For other chronic conditions, will resume home medications upon medication reconciliation.    Code status: Full Code  DVT prophylaxis:  SCDs    Diet: DIET DIABETIC Calorie amount: CC 1800; Do you want to initiate MNT Protocol? Yes    Disposition:  The patient is currently acutely ill requiring treatment on the medical floor for vertigo rule  out CVA. Patient will be closely evaluated monitor and remove be adjusted accordingly.  Estimated length of stay greater than 48 hr to obtain full medical treatment.    Plan of care discussed with patient or who verbalized understanding and agreed.  Plan of care to be adjusted depending on response to current treatment, further testing and evaluation.  Time spent, 65 minutes.        Omelia Blackwater, MD    [facility] MEDICINE HOSPITALIST

## 2023-07-29 NOTE — ED Triage Notes (Addendum)
Reports generalized weakness for 3 days and diarrhea for 2 days. Also reports her BS was 50 this am. Also reports "fever" sweats and cough x1 week.

## 2023-07-29 NOTE — ED APP Handoff Note (Signed)
[facility]  Emergency Department  Provider in Triage Note    Name: Grace Hoffman  Age: 72 y.o.  Gender: female     Subjective:   Grace Hoffman is a 72 y.o. female who presents with complaint of Diarrhea, Weakness, Fever, and Cough  .  Pt presents with complaints of diarrhea x 2 weeks. She reports weakness x 3 days. This morning her BS was low which she treated with orange juice.     Objective:   Filed Vitals:    07/29/23 1220   BP: 116/70   Pulse: 73   Resp: 16   Temp: 36.6 C (97.8 F)   SpO2: 97%      Focused Physical Exam shows elderly female alert upright in WC. Appears in NAD.     Assessment:  A medical screening exam was completed.  This patient is a 72 y.o. female with initial findings showing weakness    Plan:  Please see initial orders and work-up below.  This is to be continued with full evaluation in the main Emergency Department.     No current facility-administered medications for this encounter.     Results for orders placed or performed during the hospital encounter of 07/29/23 (from the past 24 hour(s))   CBC/DIFF    Narrative    The following orders were created for panel order CBC/DIFF.  Procedure                               Abnormality         Status                     ---------                               -----------         ------                     CBC WITH UXLK[[id number]]                                                                 Please view results for these tests on the individual orders.   URINALYSIS, MACROSCOPIC AND MICROSCOPIC W/CULTURE REFLEX    Specimen: Urine, Clean Catch    Narrative    The following orders were created for panel order URINALYSIS, MACROSCOPIC AND MICROSCOPIC W/CULTURE REFLEX.  Procedure                               Abnormality         Status                     ---------                               -----------         ------                     Eartha Inch, MACROSCOPIC[[id number]]  URINALYSIS, MICROSCOPIC[[id number]]                                                       Please view results for these tests on the individual orders.   POC BLOOD GLUCOSE (RESULTS)   Result Value Ref Range    GLUCOSE, POC 127 (H) 70 - 100 mg/dl        Senaida Lange, PA-C  07/29/2023, 12:19

## 2023-07-30 ENCOUNTER — Inpatient Hospital Stay (HOSPITAL_COMMUNITY): Payer: Medicare Other

## 2023-07-30 DIAGNOSIS — I369 Nonrheumatic tricuspid valve disorder, unspecified: Secondary | ICD-10-CM

## 2023-07-30 DIAGNOSIS — R531 Weakness: Secondary | ICD-10-CM

## 2023-07-30 DIAGNOSIS — G459 Transient cerebral ischemic attack, unspecified: Secondary | ICD-10-CM

## 2023-07-30 DIAGNOSIS — I639 Cerebral infarction, unspecified: Secondary | ICD-10-CM

## 2023-07-30 DIAGNOSIS — R197 Diarrhea, unspecified: Secondary | ICD-10-CM

## 2023-07-30 LAB — CBC WITH DIFF
BASOPHIL #: 0.1 10*3/uL (ref 0.00–0.10)
BASOPHIL %: 1 % (ref 0–1)
EOSINOPHIL #: 0.3 10*3/uL (ref 0.00–0.50)
EOSINOPHIL %: 5 % (ref 1–7)
HCT: 39.5 % (ref 31.2–41.9)
HGB: 13.2 g/dL (ref 10.9–14.3)
LYMPHOCYTE #: 2.3 10*3/uL (ref 1.00–3.00)
LYMPHOCYTE %: 33 % (ref 16–44)
MCH: 32.6 pg (ref 24.7–32.8)
MCHC: 33.4 g/dL (ref 32.3–35.6)
MCV: 97.8 fL — ABNORMAL HIGH (ref 75.5–95.3)
MONOCYTE #: 0.5 10*3/uL (ref 0.30–1.00)
MONOCYTE %: 8 % (ref 5–13)
MPV: 11 fL — ABNORMAL HIGH (ref 7.9–10.8)
NEUTROPHIL #: 3.7 10*3/uL (ref 1.85–7.80)
NEUTROPHIL %: 54 % (ref 43–77)
PLATELETS: 159 10*3/uL (ref 140–440)
RBC: 4.04 10*6/uL (ref 3.63–4.92)
RDW: 14.2 % (ref 12.3–17.7)
WBC: 7 10*3/uL (ref 3.8–11.8)

## 2023-07-30 LAB — COMPREHENSIVE METABOLIC PANEL, NON-FASTING
ALBUMIN/GLOBULIN RATIO: 1.2 (ref 0.8–1.4)
ALBUMIN: 3.5 g/dL (ref 3.5–5.7)
ALKALINE PHOSPHATASE: 65 U/L (ref 34–104)
ALT (SGPT): 14 U/L (ref 7–52)
ANION GAP: 7 mmol/L (ref 4–13)
AST (SGOT): 14 U/L (ref 13–39)
BILIRUBIN TOTAL: 0.4 mg/dL (ref 0.3–1.0)
BUN/CREA RATIO: 18 (ref 6–22)
BUN: 13 mg/dL (ref 7–25)
CALCIUM, CORRECTED: 9.2 mg/dL (ref 8.9–10.8)
CALCIUM: 8.8 mg/dL (ref 8.6–10.3)
CHLORIDE: 109 mmol/L — ABNORMAL HIGH (ref 98–107)
CO2 TOTAL: 25 mmol/L (ref 21–31)
CREATININE: 0.74 mg/dL (ref 0.60–1.30)
ESTIMATED GFR: 85 mL/min/{1.73_m2} (ref >59)
GLOBULIN: 3 (ref 2.9–5.4)
GLUCOSE: 104 mg/dL (ref 74–109)
OSMOLALITY, CALCULATED: 282 mOsm/kg (ref 270–290)
POTASSIUM: 4.5 mmol/L (ref 3.5–5.1)
PROTEIN TOTAL: 6.5 g/dL (ref 6.4–8.9)
SODIUM: 141 mmol/L (ref 136–145)

## 2023-07-30 LAB — VITAMIN B12: VITAMIN B 12: 416 pg/mL (ref 180–914)

## 2023-07-30 LAB — POC BLOOD GLUCOSE (RESULTS)
GLUCOSE, POC: 68 mg/dl — ABNORMAL LOW (ref 70–100)
GLUCOSE, POC: 152 mg/dl — ABNORMAL HIGH (ref 70–100)
GLUCOSE, POC: 88 mg/dl (ref 70–100)
GLUCOSE, POC: 120 mg/dl — ABNORMAL HIGH (ref 70–100)
GLUCOSE, POC: 129 mg/dl — ABNORMAL HIGH (ref 70–100)
GLUCOSE, POC: 174 mg/dl — ABNORMAL HIGH (ref 70–100)

## 2023-07-30 LAB — MAGNESIUM: MAGNESIUM: 2 mg/dL (ref 1.9–2.7)

## 2023-07-30 LAB — HIV 1 AND 2 RAPID SCREEN
HIV-1/2 ANTIBODY SCREEN: NONREACTIVE
HIV1-p24 ANTIGEN SCREEN: NONREACTIVE

## 2023-07-30 LAB — C. DIFFICILE PCR
C. DIFFICILE TOXIN GENE, PCR: NEGATIVE
PRESUMPTIVE 027/NAP1/BI: NEGATIVE

## 2023-07-30 MED ORDER — GADOBUTROL 10 MMOL/10 ML (1 MMOL/ML) INTRAVENOUS SOLUTION
10.0000 mL | INTRAVENOUS | Status: AC
Start: 2023-07-30 — End: 2023-07-30
  Administered 2023-07-30: 10 mL via INTRAVENOUS

## 2023-07-30 MED ORDER — INSULIN GLARGINE 100 UNITS/ML SUBQ - CHARGE BY DOSE
10.0000 [IU] | Freq: Every evening | SUBCUTANEOUS | Status: DC
Start: 2023-07-30 — End: 2023-08-06
  Administered 2023-07-30 – 2023-07-31 (×2): 0 [IU] via SUBCUTANEOUS
  Administered 2023-08-01 – 2023-08-02 (×2): 10 [IU] via SUBCUTANEOUS
  Administered 2023-08-03: 0 [IU] via SUBCUTANEOUS
  Administered 2023-08-04 – 2023-08-05 (×2): 10 [IU] via SUBCUTANEOUS
  Filled 2023-07-30 (×5): qty 10

## 2023-07-30 NOTE — Nurses Notes (Signed)
Pt @1018  normal sinus rhythm. Pt heart dropped to 49 @1258 . Pt had a 2.03 second pause @1322 . Pt had a 2.19 second pause @1331 . Pt heart rate dropped to 33 @1406 . Pt was symptomatic at this time. Pt was pale and dizzy. Provider notified of all cardiac events. Stat EKG done. Order for tele cardio consult done. Will continue to monitor telemetry.

## 2023-07-30 NOTE — Progress Notes (Signed)
[place] MEDICINE [facility]    HOSPITALIST PROGRESS NOTE    [name]  Date of service: 07/30/2023  Date of Admission:  07/29/2023  Hospital Day:  LOS: 1 day     Subjective:   Patient seen this morning while lying in bed.  She reports she continues of some itching rash characterized by erythematous pustules on her back and chest.  Not getting any worse from yesterday.  She reports overall improvement today with no lightheadedness or dizziness.  No chest pain shortness a breath fever or any other new complaints reported.        Vital Signs:  Filed Vitals:    07/29/23 2129 07/29/23 2224 07/30/23 0048 07/30/23 0733   BP:  126/64 118/63 137/66   Pulse: 56 60 65 60   Resp:   16 16   Temp:   36.6 C (97.9 F) 36.4 C (97.6 F)   SpO2:   97% 97%        Physical Exam:  General:  Patient in NAD, resting in bed, no visitors present  Head:  Normocephalic, atraumatic  Eyes:  PERRL, anicteric sclera  ENT:  Oral mucosa moist, no nasal discharge   Neck:  Soft, supple, trachea midline  Heart:  RRR, S1 and S2 normal  Lungs:  Unlabored respirations.  Lungs are clear to auscultation bilaterally, with no wheezes, no rales, no conversational dyspnea  Abdomen:  Soft, active bowel sounds, non-tender to palpation, non-distended  Extremities:  Pulses equal bilaterally.  Capillary refill less than 3 seconds.  No edema in lower extremities bilaterally   Skin:  Warm and dry, not diaphoretic.  No ecchymosis noted.   Neuro:  A&O x 3.  No focal deficits.  Speech intact  Psych:  Cooperative, not agitated    Intake & Output:    Intake/Output Summary (Last 24 hours) at 07/30/2023 1013  Last data filed at 07/29/2023 1620  Gross per 24 hour   Intake 600 ml   Output --   Net 600 ml     I/O current shift:  No intake/output data recorded.  Emesis:    BM:       Heme:      acetaminophen (TYLENOL) tablet, 650 mg, Oral, Q4H PRN  atorvastatin (LIPITOR) tablet, 40 mg, Oral, QPM  Correction/SSIP insulin lispro (HumaLOG) 100 units/mL injection,  0-18 Units, Subcutaneous, 4x/day AC  dapagliflozin (FARXIGA) tablet, 10 mg, Oral, Daily  dextrose (GLUTOSE) 40% oral gel, 15 g, Oral, Q15 Min PRN  dextrose 50% (0.5 g/mL) injection - syringe, 12.5 g, Intravenous, Q15 Min PRN  [Held by provider] diazePAM (VALIUM) tablet, 5 mg, Oral, Daily  docusate sodium (COLACE) capsule, 100 mg, Oral, 2x/day PRN  enalapril (VASOTEC) tablet, 10 mg, Oral, Daily  [START ON 08/03/2023] ergocalciferol-vitamin D2 (DRISDOL) capsule, 50,000 Units, Oral, Q7 Days  glucagon (GLUCAGEN DIAGNOSTIC KIT) injection 1 mg, 1 mg, IntraMUSCULAR, Once PRN  heparin 5,000 unit/mL injection, 5,000 Units, Subcutaneous, Q8HRS  HYDROcodone-acetaminophen (NORCO) 7.5 mg-325 mg per tablet, 1 Tablet, Oral, Q6H PRN  latanoprost (XALATAN) 0.005 % ophthalmic solution, 1 Drop, Left Eye, NIGHTLY  Levetiracetam Tablet Sustained Release 24 hr 500 mg, 1 Tablet, Oral, NIGHTLY  magnesium oxide (MAG-OX) 400mg  (241.3 mg elemental magnesium) tablet, 800 mg, Oral, Daily  meclizine (ANTIVERT) tablet, 12.5 mg, Oral, Q8H PRN  NS flush syringe, 3 mL, Intracatheter, Q8HRS  NS flush syringe, 3 mL, Intracatheter, Q1H PRN  NS premix infusion, , Intravenous, Continuous  ondansetron (ZOFRAN) 2 mg/mL injection, 4 mg, Intravenous,  Q6H PRN  SUMAtriptan (IMITREX) tablet, 50 mg, Oral, Once PRN  venlafaxine (EFFEXOR XR) 24 hr extended release capsule, 37.5 mg, Oral, NIGHTLY          Labs:  Recent Results (from the past 48 hour(s))   CBC WITH DIFF    Collection Time: 07/30/23  5:42 AM   Result Value    WBC 7.0    HGB 13.2    HCT 39.5    PLATELETS 159      No results found for this or any previous visit (from the past 48 hour(s)).   Recent Results (from the past 48 hour(s))   COMPREHENSIVE METABOLIC PANEL, NON-FASTING    Collection Time: 07/30/23  5:42 AM   Result Value    ALKALINE PHOSPHATASE 65    ALT (SGPT) 14    AST (SGOT) 14   COMPREHENSIVE METABOLIC PANEL, NON-FASTING    Collection Time: 07/29/23 12:38 PM   Result Value    ALKALINE  PHOSPHATASE 69    ALT (SGPT) 16    AST (SGOT) 16      Results for orders placed or performed during the hospital encounter of 07/29/23 (from the past 48 hour(s))   TROPONIN-I    Collection Time: 07/29/23 12:38 PM   Result Value    TROPONIN I 3      Recent Results (from the past 48 hour(s))   PT/INR    Collection Time: 07/29/23 12:38 PM   Result Value    PROTHROMBIN TIME 12.0    INR 1.03      Results for orders placed or performed in visit on 07/08/23 (from the past 1344 hour(s))   HGA1C (HEMOGLOBIN A1C WITH EST AVG GLUCOSE)    Collection Time: 07/08/23  1:42 PM   Result Value    HEMOGLOBIN A1C 9.5 (H)      No results found for this or any previous visit (from the past 48 hour(s)).     Microbiology:  Hospital Encounter on 07/29/23 (from the past 96 hour(s))   C. DIFFICILE PCR    Collection Time: 07/30/23  6:26 AM    Specimen: Stool   Culture Result Status    C. DIFFICILE TOXIN GENE, PCR Negative Final    PRESUMPTIVE 027/NAP1/BI Negative Final       Imaging:   ECG 12 LEAD  Normal sinus rhythm  Inferior infarct , age undetermined  Abnormal ECG  No previous ECGs available  Confirmed by Marita Snellen [phone]) on 07/29/2023 8:41:45 PM  CT BRAIN WO IV CONTRAST  Narrative: Uvaldo Rising Nisley    RADIOLOGIST: Alvester Chou, MD    CT BRAIN WO IV CONTRAST performed on 07/29/2023 2:18 PM    CLINICAL HISTORY: unsteady gait x 3 days.  unsteady gait x 3 days    weakness    TECHNIQUE:  Head CT without intravenous contrast.    COMPARISON: 12/07/2022    FINDINGS:  There is no acute intracranial hemorrhage, mass effect, or evidence of large acute infarct.    Brain: Low density in the periventricular white matter suggests mild chronic small vessel ischemic changes.    CSF Spaces: Moderate generalized cerebral atrophy     Sinuses/Mastoids:  Clear at visualized levels     Bones: Unremarkable  Impression: NO ACUTE FINDINGS    One or more dose reduction techniques were used (e.g., Automated exposure control, adjustment of the mA and/or kV according  to patient size, use of iterative reconstruction technique).    Radiologist location ID: [id number]  XR CHEST PA AND LATERAL  Narrative: Brylie S Coppolino    RADIOLOGIST: Lucien Mons    XR CHEST PA AND LATERAL performed on 07/29/2023 12:46 PM    CLINICAL HISTORY: cough, fever,.  nausea, cough, fever, diarrhea since late last night into early this moring    TECHNIQUE: Frontal and lateral views of the chest.    COMPARISON:  Chest radiograph dated 12/05/2022    FINDINGS:    The heart size is normal.  The mediastinal contour is unremarkable.  The lungs are clear.   The bones are unremarkable.  Impression: NEGATIVE CHEST    Radiologist location ID: [id number]        Assessment/ Plan:   Active Hospital Problems   (*Primary Problem)    Diagnosis    *TIA (transient ischemic attack)    Diarrhea    Vertigo    Uncontrolled type 2 diabetes mellitus with hyperglycemia (CMS HCC)    Hypertension     72 year old female with past medical history of TIA hypertension hyperlipidemia diabetes mellitus coming in with unsteady gait that was preceded with nausea vomiting and diarrhea.  Symptoms suggestive of probable CVA in view of her risk factors but rule out vasovagal and all medications as possible etiologies.     -CVA vs TIA rule out posterior stroke  Telemetry monitor  History of A-Fib, no  TPA candidate, no  Dysphagia screen:  Passed  PT/OT evaluation  F/up 2D echo, MRI and MRA head and neck  CT head was unremarkable for acute findings  Frequent neuro checks  Maintain SBP 150-160 mmHg  Follow-up lipid panel  Neurology consulted on arrival in the ED.  We will consider Cardiology consult and/or Holter monitor on d/c.     -Unsteady gait with dizziness, rule out BPPV  Orthostatic vital signs  Meclizine  We will check , B6, folate, B1 and syphilis pending.  B12and HIV unremarkable.  Seizure precautions  IV fluids for hydration     -Diabetes mellitus  hemoglobin A1c, 9.5 on 07/08/23  Lantus with insulin sliding scale fingerstick blood  sugar a.c. HS     -Hyperlipidemia  Lipid panel  Continue with home medications including statins     -Hypertension, controlled  Continue with Vasotec, to adjust medications as blood pressure responds.     -History of seizures  Continue with Keppra     -Age-related physical debility  PT OT evaluation for discharge disposition planning     For other chronic conditions, will resume home medications upon medication reconciliation.     Code status: Full Code  DVT prophylaxis:  SCDs    Diet: DIET DIABETIC Calorie amount: CC 1800; Do you want to initiate MNT Protocol? Yes      Disposition Planning:  Home in 1-2 days depending on clinical course.    Omelia Blackwater, MD  07/30/2023  [facility] MEDICINE HOSPITALIST

## 2023-07-30 NOTE — PT Evaluation (Signed)
[facility] Medicine [facility]  [address]  [place], [address]  [phone]  (Fax) [phone]  Rehabilitation Services  Physical Therapy     Patient Name: Grace Hoffman  Date of Birth: 04/20/1951  Height: Height: 165.1 cm (5\' 5" )  Weight: Weight: 87 kg (191 lb 11.2 oz)  Room/Bed: 322/A  Payor: MEDICARE / Plan: MEDICARE PART A AND B / Product Type: Medicare /     PATIENT DID NOT PARTICIPATE IN THERAPY TODAY DUE TO: UNAVAILABLE . Patient is off the floor for an MRI. Will follow up as schedule allows.         Rollene Rotunda, PT 07/30/2023,19:34

## 2023-07-30 NOTE — Care Management Notes (Signed)
[facility]  Care Management Initial Evaluation    Patient Name: Grace Hoffman  Date of Birth: 05-23-1951  Sex: female  Date/Time of Admission: 07/29/2023 12:21 PM  Room/Bed: 322/A  Payor: MEDICARE / Plan: MEDICARE PART A AND B / Product Type: Medicare /   Primary Care Providers:  Rosalio Loud, PA-C (General)    Pharmacy Info:   Preferred Pharmacy       [facility] [phone] [place], [place] - [address] AT [facility] OF [address]    [address] [place] [address]    Phone: [phone] Fax: [phone]    Hours: Not open 24 hours    [facility] [address], [place] - [address]    [address] [place] [address]    Phone: [phone] Fax: [phone]    Hours: Not open 24 hours          Emergency Contact Info:   Extended Emergency Contact Information  Primary Emergency Contact: [facility]  Mobile Phone: [phone]  Relation: Husband  Preferred language: English  Interpreter needed? No    History:   Grace Hoffman is a 72 y.o., female, admitted     Height/Weight: 165.1 cm (5\' 5" ) / 87 kg (191 lb 11.2 oz)     LOS: 1 day   Admitting Diagnosis: TIA (transient ischemic attack) [G45.9]    Assessment:      07/30/23 1553   Assessment Details   Assessment Type Admission   Date of Care Management Update 07/30/23   Readmission   Is this a readmission? No   Insurance Information/Type   Insurance type Medicare   Employment/Financial   Patient has Prescription Coverage?  Yes        Name of Insurance Coverage for Medications MEDICARE   Financial Concerns none   Living Environment   Select an age group to open "lives with" row.  Adult   Lives With spouse   Living Arrangements house   Able to Return to Prior Arrangements yes   Home Safety   Home Assessment: No Problems Identified   Home Accessibility stairs to enter home;no concerns   Custody and Legal Status   Do you have a court appointed guardian/conservator? No   Are you an emancipated  minor? No   Custody Issues? No   Paternity Affidavit Requested? No   Care Management Plan   Discharge Planning Status initial meeting   Discharge plan discussed with: Patient;Spouse   CM will evaluate for rehabilitation potential no   Patient choice offered to patient/family no   Form for patient choice reviewed/signed and on chart no   Facility or Agency Preferences PATIENT DENIES USE OF OR NEED FOR HH OR ADDITIONAL DME.   Discharge Needs Assessment   Equipment Currently Used at Home cane, straight;walker, front wheeled;walker, rolling   Equipment Needed After Discharge none   Discharge Facility/Level of Care Needs Home (Patient/Family Member/other)(code 1)   Transportation Available family or friend will provide   Referral Information   Admission Type inpatient         Discharge Plan:  Home (Patient/Family Member/other) (code 1)  CM SPOKE WITH THE PATIENT AND HUSBAND AT BEDSIDE REGARDING D/C GOALS. PATIENT STATES THAT She WILL RETURN HOME AND EXPECTS TO BE RATHER INDEPENDENT. PATIENT STATES THAT HER HUSBAND BROUGHT HER WALKER IN [place] THAT She WILL BE ALLOWED TO USE IT TO GO TO THE BATHROOM INDEPENDENTLY. PATIENT STATES THAT She DRIVES  AND DOESN'T HAVE DIFFICULTY WITH ADLS AND RUNNING ERRANDS.     The patient will continue to be evaluated for developing discharge needs.     Case Manager: Grace Hoffman, SOCIAL WORKER  Phone: [phone]

## 2023-07-30 NOTE — PT Evaluation (Signed)
[facility] Medicine [facility]  [address]  [place], [address]  [phone]  (Fax) [phone]  Rehabilitation Services  Physical Therapy     Patient Name: Grace Hoffman  Date of Birth: [birth year]  Height: Height: 165.1 cm (5\' 5" )  Weight: Weight: 87 kg (191 lb 11.2 oz)  Room/Bed: 322/A  Payor: MEDICARE / Plan: MEDICARE PART A AND B / Product Type: Medicare /     PATIENT DID NOT PARTICIPATE IN THERAPY TODAY DUE TO: UNAVAILABLE . Patient is off the floor for an echocardiogram. Will follow up as schedule allows.         Rollene Rotunda, PT 07/30/2023,19:35

## 2023-07-31 ENCOUNTER — Other Ambulatory Visit (INDEPENDENT_AMBULATORY_CARE_PROVIDER_SITE_OTHER): Payer: Self-pay

## 2023-07-31 DIAGNOSIS — R001 Bradycardia, unspecified: Secondary | ICD-10-CM | POA: Diagnosis present

## 2023-07-31 LAB — COMPREHENSIVE METABOLIC PANEL, NON-FASTING
ALBUMIN/GLOBULIN RATIO: 1.1 (ref 0.8–1.4)
ALBUMIN: 3.6 g/dL (ref 3.5–5.7)
ALKALINE PHOSPHATASE: 72 U/L (ref 34–104)
ALT (SGPT): 12 U/L (ref 7–52)
ANION GAP: 10 mmol/L (ref 4–13)
AST (SGOT): 16 U/L (ref 13–39)
BILIRUBIN TOTAL: 0.3 mg/dL (ref 0.3–1.0)
BUN/CREA RATIO: 21 (ref 6–22)
BUN: 18 mg/dL (ref 7–25)
CALCIUM, CORRECTED: 9.3 mg/dL (ref 8.9–10.8)
CALCIUM: 9 mg/dL (ref 8.6–10.3)
CHLORIDE: 111 mmol/L — ABNORMAL HIGH (ref 98–107)
CO2 TOTAL: 16 mmol/L — ABNORMAL LOW (ref 21–31)
CREATININE: 0.85 mg/dL (ref 0.60–1.30)
ESTIMATED GFR: 72 mL/min/{1.73_m2} (ref >59)
GLOBULIN: 3.3 (ref 2.9–5.4)
GLUCOSE: 167 mg/dL — ABNORMAL HIGH (ref 74–109)
OSMOLALITY, CALCULATED: 280 mOsm/kg (ref 270–290)
POTASSIUM: 4.8 mmol/L (ref 3.5–5.1)
PROTEIN TOTAL: 6.9 g/dL (ref 6.4–8.9)
SODIUM: 137 mmol/L (ref 136–145)

## 2023-07-31 LAB — CBC WITH DIFF
BASOPHIL #: 0.1 10*3/uL (ref 0.00–0.10)
BASOPHIL %: 1 % (ref 0–1)
EOSINOPHIL #: 0.3 10*3/uL (ref 0.00–0.50)
EOSINOPHIL %: 4 % (ref 1–7)
HCT: 41 % (ref 31.2–41.9)
HGB: 13.9 g/dL (ref 10.9–14.3)
LYMPHOCYTE #: 2.8 10*3/uL (ref 1.00–3.00)
LYMPHOCYTE %: 35 % (ref 16–44)
MCH: 32.7 pg (ref 24.7–32.8)
MCHC: 33.9 g/dL (ref 32.3–35.6)
MCV: 96.4 fL — ABNORMAL HIGH (ref 75.5–95.3)
MONOCYTE #: 0.5 10*3/uL (ref 0.30–1.00)
MONOCYTE %: 6 % (ref 5–13)
MPV: 10.7 fL (ref 7.9–10.8)
NEUTROPHIL #: 4.4 10*3/uL (ref 1.85–7.80)
NEUTROPHIL %: 54 % (ref 43–77)
PLATELETS: 156 10*3/uL (ref 140–440)
RBC: 4.26 10*6/uL (ref 3.63–4.92)
RDW: 14.3 % (ref 12.3–17.7)
WBC: 8.2 10*3/uL (ref 3.8–11.8)

## 2023-07-31 LAB — SCAN DIFFERENTIAL: PLATELET MORPHOLOGY COMMENT: NORMAL

## 2023-07-31 LAB — POC BLOOD GLUCOSE (RESULTS)
GLUCOSE, POC: 131 mg/dl — ABNORMAL HIGH (ref 70–100)
GLUCOSE, POC: 179 mg/dl — ABNORMAL HIGH (ref 70–100)
GLUCOSE, POC: 164 mg/dl — ABNORMAL HIGH (ref 70–100)
GLUCOSE, POC: 221 mg/dl — ABNORMAL HIGH (ref 70–100)

## 2023-07-31 LAB — MAGNESIUM: MAGNESIUM: 1.9 mg/dL (ref 1.9–2.7)

## 2023-07-31 LAB — SYPHILIS SCREENING ALGORITHM WITH REFLEX, SERUM: SYPHILIS TP ANTIBODIES: NONREACTIVE

## 2023-07-31 MED ORDER — DIPHENHYDRAMINE 25 MG CAPSULE
25.0000 mg | ORAL_CAPSULE | Freq: Once | ORAL | Status: AC
Start: 2023-07-31 — End: 2023-07-31
  Administered 2023-07-31: 25 mg via ORAL
  Filled 2023-07-31: qty 1

## 2023-07-31 NOTE — Respiratory Therapy (Signed)
Overnight pulse ox study    Total Time Below:0 secLongest Duration:0 secLowest SpO?:N/ANumber of Events:0    Patient remained on room air.

## 2023-07-31 NOTE — Progress Notes (Signed)
[place] MEDICINE [facility]    HOSPITALIST PROGRESS NOTE    Grace Hoffman  Date of service: 07/31/2023  Date of Admission:  07/29/2023  Hospital Day:  LOS: 2 days     Subjective:   Patient seen was seated at bedside this morning.  She reports overall improvement.  She denies chest pain shortness a breath or dizziness currently.  No further new skin rash reported.  No chest pain shortness a breath fever or any other new complaints reported.        Vital Signs:  Filed Vitals:    07/30/23 2046 07/30/23 2208 07/30/23 2347 07/31/23 0801   BP: (!) (P) 146/68  137/72 133/63   Pulse: (P) 71 67 64 59   Resp:   15 16   Temp: (P) 36.3 C (97.4 F)  36.3 C (97.4 F) 36.2 C (97.2 F)   SpO2: (P) 98%  98% 97%        Physical Exam:  General:  Patient in NAD, resting in bed, no visitors present  Head:  Normocephalic, atraumatic  Eyes:  PERRL, anicteric sclera  ENT:  Oral mucosa moist, no nasal discharge   Neck:  Soft, supple, trachea midline  Heart:  RRR, S1 and S2 normal  Lungs:  Unlabored respirations.  Lungs are clear to auscultation bilaterally, with no wheezes, no rales, no conversational dyspnea  Abdomen:  Soft, active bowel sounds, non-tender to palpation, non-distended  Extremities:  Pulses equal bilaterally.  Capillary refill less than 3 seconds.  No edema in lower extremities bilaterally   Skin:  Warm and dry, not diaphoretic.  No ecchymosis noted.   Neuro:  A&O x 3.  No focal deficits.  Speech intact  Psych:  Cooperative, not agitated    Intake & Output:    Intake/Output Summary (Last 24 hours) at 07/31/2023 1039  Last data filed at 07/30/2023 1800  Gross per 24 hour   Intake 480 ml   Output --   Net 480 ml     I/O current shift:  No intake/output data recorded.  Emesis:    BM:    Date of Last Bowel Movement: 07/30/23  Heme:      acetaminophen (TYLENOL) tablet, 650 mg, Oral, Q4H PRN  atorvastatin (LIPITOR) tablet, 40 mg, Oral, QPM  Correction/SSIP insulin lispro (HumaLOG) 100 units/mL injection, 0-18 Units,  Subcutaneous, 4x/day AC  dapagliflozin (FARXIGA) tablet, 10 mg, Oral, Daily  dextrose (GLUTOSE) 40% oral gel, 15 g, Oral, Q15 Min PRN  dextrose 50% (0.5 g/mL) injection - syringe, 12.5 g, Intravenous, Q15 Min PRN  [Held by provider] diazePAM (VALIUM) tablet, 5 mg, Oral, Daily  docusate sodium (COLACE) capsule, 100 mg, Oral, 2x/day PRN  enalapril (VASOTEC) tablet, 10 mg, Oral, Daily  [START ON 08/03/2023] ergocalciferol-vitamin D2 (DRISDOL) capsule, 50,000 Units, Oral, Q7 Days  glucagon (GLUCAGEN DIAGNOSTIC KIT) injection 1 mg, 1 mg, IntraMUSCULAR, Once PRN  heparin 5,000 unit/mL injection, 5,000 Units, Subcutaneous, Q8HRS  HYDROcodone-acetaminophen (NORCO) 7.5 mg-325 mg per tablet, 1 Tablet, Oral, Q6H PRN  insulin glargine 100 units/mL injection, 10 Units, Subcutaneous, NIGHTLY  latanoprost (XALATAN) 0.005 % ophthalmic solution, 1 Drop, Left Eye, NIGHTLY  Levetiracetam Tablet Sustained Release 24 hr 500 mg, 1 Tablet, Oral, NIGHTLY  magnesium oxide (MAG-OX) 400mg  (241.3 mg elemental magnesium) tablet, 800 mg, Oral, Daily  meclizine (ANTIVERT) tablet, 12.5 mg, Oral, Q8H PRN  NS flush syringe, 3 mL, Intracatheter, Q8HRS  NS flush syringe, 3 mL, Intracatheter, Q1H PRN  NS premix infusion, , Intravenous, Continuous  ondansetron (ZOFRAN) 2 mg/mL injection, 4 mg, Intravenous, Q6H PRN  SUMAtriptan (IMITREX) tablet, 50 mg, Oral, Once PRN  venlafaxine (EFFEXOR XR) 24 hr extended release capsule, 37.5 mg, Oral, NIGHTLY          Labs:  Recent Results (from the past 48 hour(s))   CBC WITH DIFF    Collection Time: 07/31/23  3:33 AM   Result Value    WBC 8.2    HGB 13.9    HCT 41.0    PLATELETS 156      No results found for this or any previous visit (from the past 48 hour(s)).   Recent Results (from the past 48 hour(s))   COMPREHENSIVE METABOLIC PANEL, NON-FASTING    Collection Time: 07/31/23  3:33 AM   Result Value    ALKALINE PHOSPHATASE 72    ALT (SGPT) 12    AST (SGOT) 16   COMPREHENSIVE METABOLIC PANEL, NON-FASTING     Collection Time: 07/30/23  5:42 AM   Result Value    ALKALINE PHOSPHATASE 65    ALT (SGPT) 14    AST (SGOT) 14   COMPREHENSIVE METABOLIC PANEL, NON-FASTING    Collection Time: 07/29/23 12:38 PM   Result Value    ALKALINE PHOSPHATASE 69    ALT (SGPT) 16    AST (SGOT) 16      Results for orders placed or performed during the hospital encounter of 07/29/23 (from the past 48 hour(s))   TROPONIN-I    Collection Time: 07/29/23 12:38 PM   Result Value    TROPONIN I 3      Recent Results (from the past 48 hour(s))   PT/INR    Collection Time: 07/29/23 12:38 PM   Result Value    PROTHROMBIN TIME 12.0    INR 1.03      Results for orders placed or performed in visit on 07/08/23 (from the past 1344 hour(s))   HGA1C (HEMOGLOBIN A1C WITH EST AVG GLUCOSE)    Collection Time: 07/08/23  1:42 PM   Result Value    HEMOGLOBIN A1C 9.5 (H)      No results found for this or any previous visit (from the past 48 hour(s)).     Microbiology:  Hospital Encounter on 07/29/23 (from the past 96 hour(s))   ADULT ROUTINE BLOOD CULTURE, SET OF 2 ADULT BOTTLES (BACTERIA AND YEAST)    Collection Time: 07/29/23 12:38 PM    Specimen: Blood   Culture Result Status    BLOOD CULTURE, ROUTINE No Growth 18-24 hrs. Preliminary   ADULT ROUTINE BLOOD CULTURE, SET OF 2 ADULT BOTTLES (BACTERIA AND YEAST)    Collection Time: 07/29/23 12:39 PM    Specimen: Blood   Culture Result Status    BLOOD CULTURE, ROUTINE No Growth 18-24 hrs. Preliminary   C. DIFFICILE PCR    Collection Time: 07/30/23  6:26 AM    Specimen: Stool   Culture Result Status    C. DIFFICILE TOXIN GENE, PCR Negative Final    PRESUMPTIVE 027/NAP1/BI Negative Final       Imaging:   TRANSTHORACIC ECHOCARDIOGRAM - ADULT  **See full report in linked PDF document**    S  [facility]                                                                                          [address] [place],  [place] [address]                                                                                     Transthoracic Echocardiographic Report              ______________________________________________________________________________  Name: Grace Hoffman, Grace Hoffman                                  MRN: [id number]               Weight: 197 lb  Study Date: 07/30/2023 03:02 PM                         DOB: 1951/08/31             Height: 65 in  Gender: Female                                          Age: 72 yrs                 BSA: 2.0 m2  Accession #: [id number]                              BP: 133/74 mmHg  Patient Location: PRN NON INVASIVE CARD PRN  Ordering Provider: Omelia Blackwater  TechLeavy Cella              ______________________________________________________________________________  Procedure:  Transthoracic complete echo, 2D, spectral and tissue Doppler, color flow Doppler, M-mode. Agitated saline bubble study was performed.    Quality:  The study images were of technically good quality.    Indications: CVA (cerebral vascular accident) (CMS HCC)    Conclusions:  Normal left ventricular size.  The left ventricular ejection fraction was calculated using the biplane Simpson`s rule method.  Left ventricular systolic function is normal.  Left ventricular diastolic parameters are normal.  Agitated saline bubble study is negative for intracardiac shunt.    Ejection Fraction is 53.5 %.    Findings  Left Ventricle:   Normal left ventricular size. Normal geometry. Ejection Fraction is 53.5 %. Left ventricular systolic function is normal. The left ventricular ejection fraction was calculated using  the biplane Simpson`s rule method. Regional wall motion abnormalities cannot be excluded due to limited visualization. Left ventricular diastolic parameters are normal. The global longitudinal strain  was calculated at -  13.1.  Right Ventricle:   Normal right ventricular size. Normal right ventricular systolic function. RV systolic pressure  is at the upper limits of normal.  Left Atrium:   The left atrium is normal in size.  Right Atrium:   The right atrium is of normal size.  Mitral Valve:   No significant mitral regurgitation present.  Tricuspid Valve:   There is mild tricuspid regurgitation.  Aortic Valve:   The aortic valve is not well visualized. The peak velocity across the aortic valve is 176.5 cm/sec. No Aortic valve stenosis.  Pulmonic Valve:   No significant pulmonic valve regurgitation present.  Atrial Septum:   Agitated saline bubble study is negative for intracardiac shunt.  IVC/Hepatic Veins:   Normal IVC size with >50% inspiratory collapse (estimated RA pressure 3 mmHg).  Pericardium/Pleural space:   Normal pericardium with no pericardial effusion.    Electronically signed by: MD ABBAS ALI on 07/30/2023 05:43 PM  MRA INTRACRANIAL WO CONTRAST  Narrative: Grace Hoffman    RADIOLOGIST: Karolee Stamps    MRA INTRACRANIAL WO CONTRAST performed on 07/30/2023 11:41 AM    CLINICAL HISTORY: Stroke.  weakness and unsteady gait for 3 days, coughs and sweat for 1 week, intermittent vertigo, decreased blood sugar levels (diabetic) hx of 1 seizure and migraines    TECHNIQUE:  3D Time of Flight MRA of the head without intravenous contrast.  Axial and 3D reformatted images.    COMPARISON: Noncontrasted CT from 07/29/2023    FINDINGS:  Anatomy:  Circle of Willis anatomy is within normal limits.    Anterior Circulation:  Distal internal carotid, anterior and middle cerebral arteries appear patent without high grade stenosis.  Atherosclerotic changes of the bilateral intracranial internal carotid and left middle cerebral arteries present No large aneurysms are identified.    Posterior Circulation:  The visualized distal most vertebral, the basilar and the posterior cerebral arteries appear patent without high grade stenosis.  No large aneurysms are identified.     Impression: 1. No hemodynamically significant stenosis or major arterial vascular occlusion  involving the circle of Willis.  2. Atherosclerotic changes.    Radiologist location ID: [id number]  MRA CAROTID-EXTRACRANIAL (NECK) W/WO CONTRAST  Narrative: Grace Hoffman    RADIOLOGIST: Valda Favia, MD    MRA CAROTID-EXTRACRANIAL (NECK) W/WO CONTRAST performed on 07/30/2023 11:45 AM    CLINICAL HISTORY: Stroke.  weakness and unsteady gait for 3 days, coughs and sweat for 1 week, intermittent vertigo, decreased blood sugar levels (diabetic) hx of 1 seizure and migraines    TECHNIQUE:  Neck MRA using 2D and 3D Time of Flight technique and with intravenous gadolinium-based contrast.    INTRAVENOUS CONTRAST: 10 ml's of Gadavist    COMPARISON:  None.    FINDINGS:  Flow:  2D Time of Flight images demonstrate antegrade carotid and vertebral artery flow.    Carotids:  No evidence of significant stenosis on time of flight or postcontrast images.           Right ICA stenosis (NASCET):  10 %         Left ICA stenosis (NASCET):  20 %    Vertebrals:  Codominant without evidence of high grade stenosis.    Arch:  No significant stenosis identified at the arch vessel origins, brachiocephalic or proximal subclavian arteries.  Impression: NO EVIDENCE OF SIGNIFICANT CAROTID OR VERTEBRAL ARTERY STENOSIS ON NECK MRA.    Radiologist location ID: [id number]  MRI BRAIN WO CONTRAST  Narrative: Grace Hoffman  RADIOLOGIST: Alvester Chou, MD    MRI BRAIN WO CONTRAST performed on 07/30/2023 11:47 AM    CLINICAL HISTORY: TIA.  weakness, unsteady gait x3days, cough and sweat for 1 week, intermittent vertigo, decreased blood sugar levels (diabetic), hx of 1 seizure 5 yrs ago and migraines    TECHNIQUE: Noncontrast brain MRI.    COMPARISON:  None.    FINDINGS:  Brain: Ventricular and sulcal prominence is present with moderate cerebral volume loss. There are multiple foci of T2 prolongation cerebral white matter as well as in the pons which are likely due to chronic microvascular changes. There is an area of T2 prolongation in the  right frontal subcortical white matter likely due to an old infarct.    Diffusion weighted images show no evidence of acute or recent infarct.     Ventricles: Consistent with the overall degree of cerebral atrophy.    Major Intracranial Vessels: Normal flow voids.    Sinuses: Clear.  Mastoids: Clear.  Impression: 1. NO ACUTE OR SUBACUTE INFARCT  2. 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 THIS IS LIKELY DUE TO CHRONIC MICROVASCULAR CHANGES  3. ABNORMAL T2 PROLONGATION IN RIGHT FRONTAL SUBCORTICAL WHITE MATTER LIKELY AN OLD INFARCT    Radiologist location ID: [id number]        Assessment/ Plan:   Active Hospital Problems   (*Primary Problem)    Diagnosis    *TIA (transient ischemic attack)    Diarrhea    Vertigo    Uncontrolled type 2 diabetes mellitus with hyperglycemia (CMS HCC)    Hypertension     72 year old female with past medical history of TIA hypertension hyperlipidemia diabetes mellitus coming in with unsteady gait that was preceded with nausea vomiting and diarrhea.  Symptoms suggestive of probable CVA in view of her risk factors but rule out vasovagal and all medications as possible etiologies.     -CVA vs TIA rule out posterior stroke  Telemetry monitor  History of A-Fib, no  TPA candidate, no  Dysphagia screen:  Passed  PT/OT evaluation  2D echo, EF 50-55% no intracardiac shunts, MRI and MRA head and neck were unremarkable for acute findings  CT head was unremarkable for acute findings  Frequent neuro checks  Maintain SBP 150-160 mmHg  Follow-up lipid panel  Neurology consulted on arrival in the ED.  We consult Cardiology who believe this is likely vasovagal.  Recommending follow up with Cardiology as outpatient.    Holter monitor on d/c.     -Unsteady gait with dizziness, rule out BPPV  Orthostatic vital signs  Meclizine  We will check , B6, folate, B1 and syphilis pending.  B12and HIV unremarkable.  Seizure precautions  IV fluids for hydration      -Diabetes mellitus  hemoglobin A1c, 9.5 on 07/08/23  Lantus with insulin sliding scale fingerstick blood sugar a.c. HS     -Hyperlipidemia  Lipid panel  Continue with home medications including statins     -Hypertension, controlled  Continue with Vasotec, to adjust medications as blood pressure responds.     -History of seizures  Continue with Keppra     -Age-related physical debility  PT OT evaluation for discharge disposition planning     For other chronic conditions, will resume home medications upon medication reconciliation.     Code status: Full Code  DVT prophylaxis:  SCDs    Diet: DIET DIABETIC Calorie amount: CC 1800; Do you want to initiate MNT Protocol? Yes  Disposition Planning:  Home in 1-2 days depending on clinical course.    Omelia Blackwater, MD  07/31/2023  [facility] MEDICINE HOSPITALIST

## 2023-07-31 NOTE — Care Plan (Signed)
Remains hospitalized d/t dizziness, low BS, and decreased HR. Tele cardiologist consulted. Currently on RA to maintain adequate oxygen saturation. Up with 1 assist to ambulate. Heparin in use for DVT prophylaxis. Diabetic diet in place to meet nutritional needs. PRN pain medication administered as needed/ ordered . Education on diagnosis and disease process is ongoing. Assessment for transition readiness is continued.   Problem: Adult Inpatient Plan of Care  Goal: Plan of Care Review  07/31/2023 1444 by Liliane Channel, RN  Outcome: Ongoing (see interventions/notes)  07/31/2023 1444 by Liliane Channel, RN  Outcome: Ongoing (see interventions/notes)     Problem: Adult Inpatient Plan of Care  Goal: Absence of Hospital-Acquired Illness or Injury  Intervention: Prevent Skin Injury  Recent Flowsheet Documentation  Taken 07/31/2023 0801 by Liliane Channel, RN  Skin Protection: adhesive use limited     Problem: Health Knowledge, Opportunity to Enhance (Adult,Obstetrics,Pediatric)  Goal: Knowledgeable about Health Subject/Topic  Description: Patient will demonstrate the desired outcomes by discharge/transition of care.  07/31/2023 1444 by Liliane Channel, RN  Outcome: Ongoing (see interventions/notes)  07/31/2023 1444 by Liliane Channel, RN  Outcome: Ongoing (see interventions/notes)  Intervention: Enhance Health Knowledge  Recent Flowsheet Documentation  Taken 07/31/2023 0801 by Liliane Channel, RN  Supportive Measures: active listening utilized

## 2023-07-31 NOTE — Telemedicine Consult (Signed)
[facility]  Cardiology   Consult      TELEMEDICINE CARDIOLOGY CONSULT NOTE    Telemedicine Documentation:   Modality: Video  Provider Physical Location: [place], [facility]  Patient/family aware of provider location: Yes  Patient/family consent for telemedicine: Yes  Examination observed and performed by: Hassel Neth, PA-C, Dr. Despina Hidden  Patient Location: [facility]    Name:  Grace Hoffman   DOB: [birth year]   MRN: [id number]   Date:  07/29/2023     PCP: Eyvonne Mechanic, PA-C   Hospital Day:  LOS: 2 days   Chief Complaint: Diarrhea, Weakness, Fever, and Cough      IMPRESSION/PLAN:  TIA  Sinus bradycardia  Sinus pulse  Hypertension  Type 2 diabetes    Patient is reporting that the dizziness is primarily when she stands up.  She does have history of orthostatic hypotension.  Bradycardia likely vagal response.  No indication for permanent pacemaker at this time.  Recommend outpatient  general cardiology follow up.    Echo:  Ordered by the hospitalist and already performed.  EF 53.5%.  Bubble study shows no shunt.  No significant valvular disease.    HPI:  Grace Hoffman is a 72 y.o. female who presented to the [facility] ER on August 7th due to 3 day history unsteady gait and dizziness.  Over last 3 days she has been having generalized weakness and unsteady gait.  She had been having cough and sweats for the week prior to that as well.  She noted 1 episode of diarrhea the day prior to coming in and 2 episodes of diarrhea the day of presentation.  She denied any chest pain, shortness a breath or vomiting.  Patient does have history of diabetes and during 1 of her dizzy episodes noted that her blood sugar was 50 and drank some orange juice to bring him back up.  She does follow with Dr. Aretha Parrot local neurologist for intermittent vertigo.  She does have history of TIAs past.  CT and MRIs already performed that shows no acute stroke.  Yesterday patient did have episodes of sinus bradycardia  into the 40s.  She also had some sinus pauses however these were shorter than 3 seconds.  Cardiology was consulted for the bradycardia.  She was not on any rate lowering medications at home.  At time of cardiology evaluation patient was sitting on bedside comfortably in no acute distress.  She denies any current dizziness, chest pain, palpitations or shortness a breath.      Past Medical History:   Diagnosis Date    Anxiety     Chronic back pain     COVID-19 vaccine series completed     Depression     Diabetes mellitus, type 2 (CMS HCC)     Fibromyalgia affecting multiple sites     Hypertension     Hypomagnesemia     Insomnia disorder     Migraine     Mixed hyperlipidemia     Orthostatic hypotension     Osteoarthritis     TIA (transient ischemic attack)     Unspecified glaucoma(365.9)     Vitamin D deficiency          Patient Active Problem List    Diagnosis Date Noted    Diarrhea 07/29/2023    Vitamin D deficiency 12/19/2022    Hyperlipidemia 12/19/2022    COVID-19 12/06/2022    Mood disorder (CMS HCC) 12/02/2022    Chronic back pain  11/03/2022    Bulging of lumbar intervertebral disc 11/03/2022    Essential hypertension 10/07/2022    Chronic kidney disease (CKD), stage II (mild) 10/07/2022    DJD (degenerative joint disease), lumbar 10/06/2022    Renal cyst, acquired, right 08/13/2022    Urinary incontinence 07/16/2022    Fatigue 07/16/2022    Recurrent UTI 06/16/2022    TIA (transient ischemic attack) 06/12/2022    Vertigo 05/20/2022    B12 deficiency 05/20/2022    Uncontrolled type 2 diabetes mellitus with hyperglycemia (CMS HCC) 04/03/2022    Hypomagnesemia 04/03/2022    Myalgia 04/03/2022    Long-term use of high-risk medication 04/03/2022    Chronic pain 04/03/2022    Orthostatic hypotension 03/12/2022    Hypertension 03/12/2022    Osteoarthritis 03/12/2022    Anxiety 03/12/2022    Depression 03/12/2022      Past Surgical History:   Procedure Laterality Date    HX CHOLECYSTECTOMY      HX KNEE REPLACMENT Left      HX ROTATOR CUFF REPAIR Left     SKIN CANCER EXCISION      forehead removed          acetaminophen (TYLENOL) tablet, 650 mg, Oral, Q4H PRN  atorvastatin (LIPITOR) tablet, 40 mg, Oral, QPM  Correction/SSIP insulin lispro (HumaLOG) 100 units/mL injection, 0-18 Units, Subcutaneous, 4x/day AC  dapagliflozin (FARXIGA) tablet, 10 mg, Oral, Daily  dextrose (GLUTOSE) 40% oral gel, 15 g, Oral, Q15 Min PRN  dextrose 50% (0.5 g/mL) injection - syringe, 12.5 g, Intravenous, Q15 Min PRN  [Held by provider] diazePAM (VALIUM) tablet, 5 mg, Oral, Daily  docusate sodium (COLACE) capsule, 100 mg, Oral, 2x/day PRN  enalapril (VASOTEC) tablet, 10 mg, Oral, Daily  [START ON 08/03/2023] ergocalciferol-vitamin D2 (DRISDOL) capsule, 50,000 Units, Oral, Q7 Days  glucagon (GLUCAGEN DIAGNOSTIC KIT) injection 1 mg, 1 mg, IntraMUSCULAR, Once PRN  heparin 5,000 unit/mL injection, 5,000 Units, Subcutaneous, Q8HRS  HYDROcodone-acetaminophen (NORCO) 7.5 mg-325 mg per tablet, 1 Tablet, Oral, Q6H PRN  insulin glargine 100 units/mL injection, 10 Units, Subcutaneous, NIGHTLY  latanoprost (XALATAN) 0.005 % ophthalmic solution, 1 Drop, Left Eye, NIGHTLY  Levetiracetam Tablet Sustained Release 24 hr 500 mg, 1 Tablet, Oral, NIGHTLY  magnesium oxide (MAG-OX) 400mg  (241.3 mg elemental magnesium) tablet, 800 mg, Oral, Daily  meclizine (ANTIVERT) tablet, 12.5 mg, Oral, Q8H PRN  NS flush syringe, 3 mL, Intracatheter, Q8HRS  NS flush syringe, 3 mL, Intracatheter, Q1H PRN  NS premix infusion, , Intravenous, Continuous  ondansetron (ZOFRAN) 2 mg/mL injection, 4 mg, Intravenous, Q6H PRN  SUMAtriptan (IMITREX) tablet, 50 mg, Oral, Once PRN  venlafaxine (EFFEXOR XR) 24 hr extended release capsule, 37.5 mg, Oral, NIGHTLY         Allergies   Allergen Reactions    Codeine Nausea/ Vomiting    Amoxicillin Diarrhea    Macrobid [Nitrofurantoin Monohyd/M-Cryst] Nausea/ Vomiting     Family Medical History:       Problem Relation (Age of Onset)    Kidney failure Mother    No  Known Problems Sister, Brother, Half-Sister, Half-Brother, Maternal Aunt, Maternal Uncle, Paternal Aunt, Paternal Uncle, Maternal Grandmother, Maternal Grandfather, Paternal Grandmother, Paternal Grandfather, Daughter, Son    Respiratory Problems Father            Social History     Tobacco Use    Smoking status: Never    Smokeless tobacco: Never   Substance Use Topics    Alcohol use: Never       ROS: All  other ROS are negative except for what is noted in HPI     I/O last 24 hours:    Intake/Output Summary (Last 24 hours) at 07/31/2023 0828  Last data filed at 07/30/2023 1800  Gross per 24 hour   Intake 480 ml   Output --   Net 480 ml     I/O current shift:  No intake/output data recorded.    DIAGNOSTIC STUDIES:    Results for orders placed or performed during the hospital encounter of 07/29/23 (from the past 24 hour(s))   CBC/DIFF    Narrative    The following orders were created for panel order CBC/DIFF.  Procedure                               Abnormality         Status                     ---------                               -----------         ------                     CBC WITH GNFA[[id number]]                Abnormal            Final result                 Please view results for these tests on the individual orders.   COMPREHENSIVE METABOLIC PANEL, NON-FASTING   Result Value Ref Range    SODIUM 137 136 - 145 mmol/L    POTASSIUM 4.8 3.5 - 5.1 mmol/L    CHLORIDE 111 (H) 98 - 107 mmol/L    CO2 TOTAL 16 (L) 21 - 31 mmol/L    ANION GAP 10 4 - 13 mmol/L    BUN 18 7 - 25 mg/dL    CREATININE 4.69 6.29 - 1.30 mg/dL    BUN/CREA RATIO 21 6 - 22    ESTIMATED GFR 72 >59 mL/min/1.36m^2    ALBUMIN 3.6 3.5 - 5.7 g/dL    CALCIUM 9.0 8.6 - 52.8 mg/dL    GLUCOSE 413 (H) 74 - 109 mg/dL    ALKALINE PHOSPHATASE 72 34 - 104 U/L    ALT (SGPT) 12 7 - 52 U/L    AST (SGOT) 16 13 - 39 U/L    BILIRUBIN TOTAL 0.3 0.3 - 1.0 mg/dL    PROTEIN TOTAL 6.9 6.4 - 8.9 g/dL    ALBUMIN/GLOBULIN RATIO 1.1 0.8 - 1.4    OSMOLALITY, CALCULATED 280 270 -  290 mOsm/kg    CALCIUM, CORRECTED 9.3 8.9 - 10.8 mg/dL    GLOBULIN 3.3 2.9 - 5.4    Narrative    Estimated Glomerular Filtration Rate (eGFR) is calculated using the CKD-EPI (2021) equation, intended for patients 71 years of age and older. If gender is not documented or "unknown", there will be no eGFR calculation.     MAGNESIUM   Result Value Ref Range    MAGNESIUM 1.9 1.9 - 2.7 mg/dL   CBC WITH DIFF   Result Value Ref Range    WBC 8.2 3.8 - 11.8 x10^3/uL    RBC 4.26 3.63 - 4.92 x10^6/uL    HGB 13.9  10.9 - 14.3 g/dL    HCT 21.3 08.6 - 57.8 %    MCV 96.4 (H) 75.5 - 95.3 fL    MCH 32.7 24.7 - 32.8 pg    MCHC 33.9 32.3 - 35.6 g/dL    RDW 46.9 62.9 - 52.8 %    PLATELETS 156 140 - 440 x10^3/uL    MPV 10.7 7.9 - 10.8 fL    NEUTROPHIL % 54 43 - 77 %    LYMPHOCYTE % 35 16 - 44 %    MONOCYTE % 6 5 - 13 %    EOSINOPHIL % 4 1 - 7 %    BASOPHIL % 1 0 - 1 %    NEUTROPHIL # 4.40 1.85 - 7.80 x10^3/uL    LYMPHOCYTE # 2.80 1.00 - 3.00 x10^3/uL    MONOCYTE # 0.50 0.30 - 1.00 x10^3/uL    EOSINOPHIL # 0.30 0.00 - 0.50 x10^3/uL    BASOPHIL # 0.10 0.00 - 0.10 x10^3/uL   SCAN DIFFERENTIAL   Result Value Ref Range    ANISOCYTOSIS 1+ (10-25%)     POLYCHROMASIA 1+ (10-25%)     ROULEAUX Present     OVALOCYTE (ELLIPTOCYTE) 1+ (10-25%)     TOXIC GRANULATION Present     PLATELET MORPHOLOGY COMMENT Normal    POC BLOOD GLUCOSE (RESULTS)   Result Value Ref Range    GLUCOSE, POC 88 70 - 100 mg/dl   POC BLOOD GLUCOSE (RESULTS)   Result Value Ref Range    GLUCOSE, POC 120 (H) 70 - 100 mg/dl   POC BLOOD GLUCOSE (RESULTS)   Result Value Ref Range    GLUCOSE, POC 129 (H) 70 - 100 mg/dl   POC BLOOD GLUCOSE (RESULTS)   Result Value Ref Range    GLUCOSE, POC 174 (H) 70 - 100 mg/dl   POC BLOOD GLUCOSE (RESULTS)   Result Value Ref Range    GLUCOSE, POC 131 (H) 70 - 100 mg/dl        Cardiopulmonary/Radiology:  MRA INTRACRANIAL WO CONTRAST    Result Date: 07/30/2023  Impression 1. No hemodynamically significant stenosis or major arterial vascular occlusion  involving the circle of Willis. 2. Atherosclerotic changes. Radiologist location ID: [id number]     MRA CAROTID-EXTRACRANIAL (NECK) W/WO CONTRAST    Result Date: 07/30/2023  Impression NO EVIDENCE OF SIGNIFICANT CAROTID OR VERTEBRAL ARTERY STENOSIS ON NECK MRA. Radiologist location ID: [id number]     MRI BRAIN WO CONTRAST    Result Date: 07/30/2023  Impression 1. NO ACUTE OR SUBACUTE INFARCT 2. 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 THIS IS LIKELY DUE TO CHRONIC MICROVASCULAR CHANGES 3. ABNORMAL T2 PROLONGATION IN RIGHT FRONTAL SUBCORTICAL WHITE MATTER LIKELY AN OLD INFARCT Radiologist location ID: [id number]     CT BRAIN WO IV CONTRAST    Result Date: 07/29/2023  Impression NO ACUTE FINDINGS One or more dose reduction techniques were used (e.g., Automated exposure control, adjustment of the mA and/or kV according to patient size, use of iterative reconstruction technique). Radiologist location ID: [id number]     XR CHEST PA AND LATERAL    Result Date: 07/29/2023  Impression NEGATIVE CHEST Radiologist location ID: [id number]      No results found for this or any previous visit (from the past 2400 hour(s)).     PHYSICAL EXAMINATION:  BP 133/63   Pulse 59   Temp 36.2 C (97.2 F)   Resp 16   Ht 1.651 m (5\' 5" )   Wt 87  kg (191 lb 11.2 oz)   SpO2 97%   BMI 31.90 kg/m     General: No acute distress and appears stated age.    HEENT: Head normocephalic, atraumatic.  Mucouse membranes moist.    Neck: No JVD, no carotid bruit.    Lungs: Clear to auscultation bilaterally.    Cardiovascular: Regular rate and rhythm, normal S1-S2 without murmur, no gallop or rub.    Abdomen: Soft, bowel sounds normal.    Extremities: No edema.  Skin: Skin warm and dry.    Neurologic: Alert and oriented x3.  Psych: Mood and affect congruent for age and gender     I participated and evaluated the patient as part of a collaborative telemedicine service.  See Dr. Despina Hidden 's addendum for  additional information.  My findings from this visit are as stated above.  The co-signing physician did not participate in the management of this patient unless otherwise noted.      Hassel Neth, PA-C 07/31/2023 08:28      I personally saw and evaluated the patient. See mid-level's note for additional details. My findings/participation are bradycardia not causing dizziness at this time, likely vagal response. Can see gen cards as outpt.     John Giovanni, DO

## 2023-07-31 NOTE — Nurses Notes (Signed)
Lab came to nurse station, showed to nursing staff a bug that was crawling on pt pillow that she killed. Pt bathed by PCT pt noted to have lesions to back, chest and under arms. Pt stated that OP physician told her they are bug bites. Informed physician and nursing supervisor. Pt instructed that personal belongings had to be left in the room and family had to pick up to take home. Pt verbalized understanding. Pt being moved to private room.

## 2023-08-01 ENCOUNTER — Encounter (INDEPENDENT_AMBULATORY_CARE_PROVIDER_SITE_OTHER): Payer: Self-pay | Admitting: Physician Assistant

## 2023-08-01 LAB — COMPREHENSIVE METABOLIC PANEL, NON-FASTING
ALBUMIN/GLOBULIN RATIO: 1.1 (ref 0.8–1.4)
ALBUMIN: 3.7 g/dL (ref 3.5–5.7)
ALKALINE PHOSPHATASE: 70 U/L (ref 34–104)
ALT (SGPT): 14 U/L (ref 7–52)
ANION GAP: 7 mmol/L (ref 4–13)
AST (SGOT): 15 U/L (ref 13–39)
BILIRUBIN TOTAL: 0.3 mg/dL (ref 0.3–1.0)
BUN/CREA RATIO: 21 (ref 6–22)
BUN: 21 mg/dL (ref 7–25)
CALCIUM, CORRECTED: 9.7 mg/dL (ref 8.9–10.8)
CALCIUM: 9.5 mg/dL (ref 8.6–10.3)
CHLORIDE: 108 mmol/L — ABNORMAL HIGH (ref 98–107)
CO2 TOTAL: 27 mmol/L (ref 21–31)
CREATININE: 1.02 mg/dL (ref 0.60–1.30)
ESTIMATED GFR: 58 mL/min/{1.73_m2} — ABNORMAL LOW (ref >59)
GLOBULIN: 3.3 (ref 2.9–5.4)
GLUCOSE: 148 mg/dL — ABNORMAL HIGH (ref 74–109)
OSMOLALITY, CALCULATED: 289 mOsm/kg (ref 270–290)
POTASSIUM: 5.4 mmol/L — ABNORMAL HIGH (ref 3.5–5.1)
PROTEIN TOTAL: 7 g/dL (ref 6.4–8.9)
SODIUM: 142 mmol/L (ref 136–145)

## 2023-08-01 LAB — CBC WITH DIFF
BASOPHIL #: 0.1 10*3/uL (ref 0.00–0.10)
BASOPHIL %: 1 % (ref 0–1)
EOSINOPHIL #: 0.3 10*3/uL (ref 0.00–0.50)
EOSINOPHIL %: 5 % (ref 1–7)
HCT: 41.7 % (ref 31.2–41.9)
HGB: 14.2 g/dL (ref 10.9–14.3)
LYMPHOCYTE #: 2.3 10*3/uL (ref 1.00–3.00)
LYMPHOCYTE %: 38 % (ref 16–44)
MCH: 33.2 pg — ABNORMAL HIGH (ref 24.7–32.8)
MCHC: 34 g/dL (ref 32.3–35.6)
MCV: 97.8 fL — ABNORMAL HIGH (ref 75.5–95.3)
MONOCYTE #: 0.4 10*3/uL (ref 0.30–1.00)
MONOCYTE %: 7 % (ref 5–13)
MPV: 10.7 fL (ref 7.9–10.8)
NEUTROPHIL #: 3 10*3/uL (ref 1.85–7.80)
NEUTROPHIL %: 49 % (ref 43–77)
PLATELETS: 168 10*3/uL (ref 140–440)
RBC: 4.27 10*6/uL (ref 3.63–4.92)
RDW: 14.4 % (ref 12.3–17.7)
WBC: 6.1 10*3/uL (ref 3.8–11.8)

## 2023-08-01 LAB — POC BLOOD GLUCOSE (RESULTS)
GLUCOSE, POC: 123 mg/dl — ABNORMAL HIGH (ref 70–100)
GLUCOSE, POC: 183 mg/dl — ABNORMAL HIGH (ref 70–100)
GLUCOSE, POC: 190 mg/dl — ABNORMAL HIGH (ref 70–100)
GLUCOSE, POC: 231 mg/dl — ABNORMAL HIGH (ref 70–100)

## 2023-08-01 LAB — MAGNESIUM: MAGNESIUM: 2.1 mg/dL (ref 1.9–2.7)

## 2023-08-01 LAB — E. COLI SHIGA TOXIN
SHIGA TOXIN 1: NEGATIVE
SHIGA TOXIN 2: NEGATIVE

## 2023-08-01 MED ORDER — SODIUM ZIRCONIUM CYCLOSILICATE 10 GRAM ORAL POWDER PACKET
10.0000 g | Freq: Two times a day (BID) | ORAL | Status: AC
Start: 2023-08-01 — End: 2023-08-01
  Administered 2023-08-01 (×2): 10 g via ORAL
  Filled 2023-08-01 (×2): qty 1

## 2023-08-01 NOTE — Progress Notes (Addendum)
[place] MEDICINE [facility]    HOSPITALIST PROGRESS NOTE    Grace Hoffman  Date of service: 08/01/2023  Date of Admission:  07/29/2023  Hospital Day:  LOS: 3 days     Subjective:   Patient seen while seated at bedside.  She reports having had some further dizziness upon ambulation to the bathroom associated with head spinning.  Reports having some unsteady targeting sensation on ambulation intermittently especially at home.  No chest pain shortness a breath or any other new complaints reported.  No focal weakness reported.     Vital Signs:  Filed Vitals:    07/31/23 2144 08/01/23 0033 08/01/23 0400 08/01/23 0755   BP:  119/65  135/75   Pulse: 61 62 54 70   Resp:  14  14   Temp:  36.6 C (97.8 F)  36.5 C (97.7 F)   SpO2:  97% 97% 96%        Physical Exam:  General:  Patient in NAD, resting in bed, no visitors present  Head:  Normocephalic, atraumatic  Eyes:  PERRL, anicteric sclera  ENT:  Oral mucosa moist, no nasal discharge   Neck:  Soft, supple, trachea midline  Heart:  RRR, S1 and S2 normal  Lungs:  Unlabored respirations.  Lungs are clear to auscultation bilaterally, with no wheezes, no rales, no conversational dyspnea  Abdomen:  Soft, active bowel sounds, non-tender to palpation, non-distended  Extremities:  Pulses equal bilaterally.  Capillary refill less than 3 seconds.  No edema in lower extremities bilaterally   Skin:  Warm and dry, not diaphoretic.  No ecchymosis noted.   Neuro:  A&O x 3.  No focal deficits.  Speech intact  Psych:  Cooperative, not agitated    Intake & Output:    Intake/Output Summary (Last 24 hours) at 08/01/2023 1029  Last data filed at 07/31/2023 1800  Gross per 24 hour   Intake 480 ml   Output --   Net 480 ml     I/O current shift:  No intake/output data recorded.  Emesis:    BM:    Date of Last Bowel Movement: 07/30/23  Heme:      acetaminophen (TYLENOL) tablet, 650 mg, Oral, Q4H PRN  atorvastatin (LIPITOR) tablet, 40 mg, Oral, QPM  Correction/SSIP insulin lispro  (HumaLOG) 100 units/mL injection, 0-18 Units, Subcutaneous, 4x/day AC  dapagliflozin (FARXIGA) tablet, 10 mg, Oral, Daily  dextrose (GLUTOSE) 40% oral gel, 15 g, Oral, Q15 Min PRN  dextrose 50% (0.5 g/mL) injection - syringe, 12.5 g, Intravenous, Q15 Min PRN  [Held by provider] diazePAM (VALIUM) tablet, 5 mg, Oral, Daily  docusate sodium (COLACE) capsule, 100 mg, Oral, 2x/day PRN  enalapril (VASOTEC) tablet, 10 mg, Oral, Daily  [START ON 08/03/2023] ergocalciferol-vitamin D2 (DRISDOL) capsule, 50,000 Units, Oral, Q7 Days  glucagon (GLUCAGEN DIAGNOSTIC KIT) injection 1 mg, 1 mg, IntraMUSCULAR, Once PRN  heparin 5,000 unit/mL injection, 5,000 Units, Subcutaneous, Q8HRS  HYDROcodone-acetaminophen (NORCO) 7.5 mg-325 mg per tablet, 1 Tablet, Oral, Q6H PRN  insulin glargine 100 units/mL injection, 10 Units, Subcutaneous, NIGHTLY  latanoprost (XALATAN) 0.005 % ophthalmic solution, 1 Drop, Left Eye, NIGHTLY  Levetiracetam Tablet Sustained Release 24 hr 500 mg, 1 Tablet, Oral, NIGHTLY  magnesium oxide (MAG-OX) 400mg  (241.3 mg elemental magnesium) tablet, 800 mg, Oral, Daily  meclizine (ANTIVERT) tablet, 12.5 mg, Oral, Q8H PRN  NS flush syringe, 3 mL, Intracatheter, Q8HRS  NS flush syringe, 3 mL, Intracatheter, Q1H PRN  ondansetron (ZOFRAN) 2 mg/mL injection, 4 mg, Intravenous,  Q6H PRN  sodium zirconium cyclosilicate (LOKELMA) powder, 10 g, Oral, 2x/day  SUMAtriptan (IMITREX) tablet, 50 mg, Oral, Once PRN  venlafaxine (EFFEXOR XR) 24 hr extended release capsule, 37.5 mg, Oral, NIGHTLY          Labs:  Recent Results (from the past 48 hour(s))   CBC WITH DIFF    Collection Time: 08/01/23  5:25 AM   Result Value    WBC 6.1    HGB 14.2    HCT 41.7    PLATELETS 168      No results found for this or any previous visit (from the past 48 hour(s)).   Recent Results (from the past 48 hour(s))   COMPREHENSIVE METABOLIC PANEL, NON-FASTING    Collection Time: 08/01/23  5:25 AM   Result Value    ALKALINE PHOSPHATASE 70    ALT (SGPT) 14     AST (SGOT) 15   COMPREHENSIVE METABOLIC PANEL, NON-FASTING    Collection Time: 07/31/23  3:33 AM   Result Value    ALKALINE PHOSPHATASE 72    ALT (SGPT) 12    AST (SGOT) 16      No results found for this or any previous visit (from the past 48 hour(s)).     No results found for this or any previous visit (from the past 48 hour(s)).     Results for orders placed or performed in visit on 07/08/23 (from the past 1344 hour(s))   HGA1C (HEMOGLOBIN A1C WITH EST AVG GLUCOSE)    Collection Time: 07/08/23  1:42 PM   Result Value    HEMOGLOBIN A1C 9.5 (H)      No results found for this or any previous visit (from the past 48 hour(s)).     Microbiology:  Hospital Encounter on 07/29/23 (from the past 96 hour(s))   ADULT ROUTINE BLOOD CULTURE, SET OF 2 ADULT BOTTLES (BACTERIA AND YEAST)    Collection Time: 07/29/23 12:38 PM    Specimen: Blood   Culture Result Status    BLOOD CULTURE, ROUTINE No Growth 2 Days Preliminary   ADULT ROUTINE BLOOD CULTURE, SET OF 2 ADULT BOTTLES (BACTERIA AND YEAST)    Collection Time: 07/29/23 12:39 PM    Specimen: Blood   Culture Result Status    BLOOD CULTURE, ROUTINE No Growth 2 Days Preliminary   C. DIFFICILE PCR    Collection Time: 07/30/23  6:26 AM    Specimen: Stool   Culture Result Status    C. DIFFICILE TOXIN GENE, PCR Negative Final    PRESUMPTIVE 027/NAP1/BI Negative Final       Imaging:   TRANSTHORACIC ECHOCARDIOGRAM - ADULT  **See full report in linked PDF document**    S                                                                                                [facility]  [address] [place], [place] [address]                                                                                     Transthoracic Echocardiographic Report              ______________________________________________________________________________  Name: Grace Hoffman, Grace Hoffman                                  MRN:  [id number]               Weight: 197 lb  Study Date: 07/30/2023 03:02 PM                         DOB: 05-22-1951             Height: 65 in  Gender: Female                                          Age: 72 yrs                 BSA: 2.0 m2  Accession #: [id number]                              BP: 133/74 mmHg  Patient Location: PRN NON INVASIVE CARD PRN  Ordering Provider: Omelia Blackwater  Tech: Leavy Cella              ______________________________________________________________________________  Procedure:  Transthoracic complete echo, 2D, spectral and tissue Doppler, color flow Doppler, M-mode. Agitated saline bubble study was performed.    Quality:  The study images were of technically good quality.    Indications: CVA (cerebral vascular accident) (CMS HCC)    Conclusions:  Normal left ventricular size.  The left ventricular ejection fraction was calculated using the biplane Simpson`s rule method.  Left ventricular systolic function is normal.  Left ventricular diastolic parameters are normal.  Agitated saline bubble study is negative for intracardiac shunt.    Ejection Fraction is 53.5 %.    Findings  Left Ventricle:   Normal left ventricular size. Normal geometry. Ejection Fraction is 53.5 %. Left ventricular systolic function is normal. The left ventricular ejection fraction was calculated using  the biplane Simpson`s rule method. Regional wall motion abnormalities cannot be excluded due to limited visualization. Left ventricular diastolic parameters are normal. The global longitudinal strain  was calculated at -13.1.  Right Ventricle:   Normal right ventricular size. Normal right ventricular systolic function. RV systolic pressure is at the upper limits of normal.  Left Atrium:   The left atrium is normal in size.  Right Atrium:   The right atrium is of normal size.  Mitral Valve:   No significant mitral regurgitation present.  Tricuspid Valve:   There is mild tricuspid regurgitation.  Aortic Valve:   The  aortic valve is not well visualized. The peak velocity across the aortic valve is 176.5 cm/sec.  No Aortic valve stenosis.  Pulmonic Valve:   No significant pulmonic valve regurgitation present.  Atrial Septum:   Agitated saline bubble study is negative for intracardiac shunt.  IVC/Hepatic Veins:   Normal IVC size with >50% inspiratory collapse (estimated RA pressure 3 mmHg).  Pericardium/Pleural space:   Normal pericardium with no pericardial effusion.    Electronically signed by: MD ABBAS ALI on 07/30/2023 05:43 PM  MRA INTRACRANIAL WO CONTRAST  Narrative: Eber Jones S Deahl    RADIOLOGIST: Karolee Stamps    MRA INTRACRANIAL WO CONTRAST performed on 07/30/2023 11:41 AM    CLINICAL HISTORY: Stroke.  weakness and unsteady gait for 3 days, coughs and sweat for 1 week, intermittent vertigo, decreased blood sugar levels (diabetic) hx of 1 seizure and migraines    TECHNIQUE:  3D Time of Flight MRA of the head without intravenous contrast.  Axial and 3D reformatted images.    COMPARISON: Noncontrasted CT from 07/29/2023    FINDINGS:  Anatomy:  Circle of Willis anatomy is within normal limits.    Anterior Circulation:  Distal internal carotid, anterior and middle cerebral arteries appear patent without high grade stenosis.  Atherosclerotic changes of the bilateral intracranial internal carotid and left middle cerebral arteries present No large aneurysms are identified.    Posterior Circulation:  The visualized distal most vertebral, the basilar and the posterior cerebral arteries appear patent without high grade stenosis.  No large aneurysms are identified.     Impression: 1. No hemodynamically significant stenosis or major arterial vascular occlusion involving the circle of Willis.  2. Atherosclerotic changes.    Radiologist location ID: [id number]  MRA CAROTID-EXTRACRANIAL (NECK) W/WO CONTRAST  Narrative: Uvaldo Rising Luginbill    RADIOLOGIST: Valda Favia, MD    MRA CAROTID-EXTRACRANIAL (NECK) W/WO CONTRAST performed on  07/30/2023 11:45 AM    CLINICAL HISTORY: Stroke.  weakness and unsteady gait for 3 days, coughs and sweat for 1 week, intermittent vertigo, decreased blood sugar levels (diabetic) hx of 1 seizure and migraines    TECHNIQUE:  Neck MRA using 2D and 3D Time of Flight technique and with intravenous gadolinium-based contrast.    INTRAVENOUS CONTRAST: 10 ml's of Gadavist    COMPARISON:  None.    FINDINGS:  Flow:  2D Time of Flight images demonstrate antegrade carotid and vertebral artery flow.    Carotids:  No evidence of significant stenosis on time of flight or postcontrast images.           Right ICA stenosis (NASCET):  10 %         Left ICA stenosis (NASCET):  20 %    Vertebrals:  Codominant without evidence of high grade stenosis.    Arch:  No significant stenosis identified at the arch vessel origins, brachiocephalic or proximal subclavian arteries.  Impression: NO EVIDENCE OF SIGNIFICANT CAROTID OR VERTEBRAL ARTERY STENOSIS ON NECK MRA.    Radiologist location ID: [id number]  MRI BRAIN WO CONTRAST  Narrative: Uvaldo Rising [place]ter    RADIOLOGIST: Alvester Chou, MD    MRI BRAIN WO CONTRAST performed on 07/30/2023 11:47 AM    CLINICAL HISTORY: TIA.  weakness, unsteady gait x3days, cough and sweat for 1 week, intermittent vertigo, decreased blood sugar levels (diabetic), hx of 1 seizure 5 yrs ago and migraines    TECHNIQUE: Noncontrast brain MRI.    COMPARISON:  None.    FINDINGS:  Brain: Ventricular and sulcal prominence is present with moderate cerebral volume loss. There are multiple foci of T2 prolongation cerebral white  matter as well as in the pons which are likely due to chronic microvascular changes. There is an area of T2 prolongation in the right frontal subcortical white matter likely due to an old infarct.    Diffusion weighted images show no evidence of acute or recent infarct.     Ventricles: Consistent with the overall degree of cerebral atrophy.    Major Intracranial Vessels: Normal flow voids.    Sinuses:  Clear.  Mastoids: Clear.  Impression: 1. NO ACUTE OR SUBACUTE INFARCT  2. 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 THIS IS LIKELY DUE TO CHRONIC MICROVASCULAR CHANGES  3. ABNORMAL T2 PROLONGATION IN RIGHT FRONTAL SUBCORTICAL WHITE MATTER LIKELY AN OLD INFARCT    Radiologist location ID: [id number]        Assessment/ Plan:   Active Hospital Problems   (*Primary Problem)    Diagnosis    *TIA (transient ischemic attack)    Asymptomatic bradycardia    Diarrhea    Vertigo    Uncontrolled type 2 diabetes mellitus with hyperglycemia (CMS HCC)    Hypertension     73 year old female with past medical history of TIA hypertension hyperlipidemia diabetes mellitus coming in with unsteady gait that was preceded with nausea vomiting and diarrhea.  Symptoms suggestive of probable CVA in view of her risk factors but rule out vasovagal and all medications as possible etiologies.     -CVA vs TIA rule out posterior stroke  Telemetry monitor  History of A-Fib, no  TPA candidate, no  Dysphagia screen:  Passed  PT/OT evaluation  2D echo, EF 50-55% no intracardiac shunts, MRI and MRA head and neck were unremarkable for acute findings  CT head was unremarkable for acute findings  Frequent neuro checks  Maintain SBP 150-160 mmHg  Follow-up lipid panel  Neurology consulted on arrival in the ED.  We consult Cardiology who believe this is likely vasovagal.  Recommending follow up with Cardiology as outpatient.    Holter monitor on d/c.     -Unsteady gait with dizziness, rule out BPPV  Orthostatic vital signs  Meclizine  We will check , B6, B1 pending. Folate and syphilis, B12and HIV unremarkable.  Seizure precautions  Encourage oral hydration  Epley maneuver  PT evaluation    -Bradycardia with dizziness  As per Cardiology bradycardia is likely vagal response.  No need for pacemaker at this time.  Patient follow up with the Cardiology as outpatient per cardiology  service.    -Hyperkalemia  Lokelma offered  We will continue to monitor BM     -Diabetes mellitus  hemoglobin A1c, 9.5 on 07/08/23  Lantus with insulin sliding scale fingerstick blood sugar a.c. HS     -Hyperlipidemia  Lipid panel  Continue with home medications including statins     -Hypertension, controlled  Continue with Vasotec, to adjust medications as blood pressure responds.     -History of seizures  Continue with Keppra     -Age-related physical debility  PT OT evaluation for discharge disposition planning     For other chronic conditions, will resume home medications upon medication reconciliation.     Code status: Full Code  DVT prophylaxis:  SCDs    Diet: DIET DIABETIC Calorie amount: CC 1800; Do you want to initiate MNT Protocol? Yes      Disposition Planning:  Home in 1-2 days depending on clinical course.    Omelia Blackwater, MD  08/01/2023  [facility] MEDICINE HOSPITALIST

## 2023-08-01 NOTE — Nurses Notes (Addendum)
Dr. Linde Gillis came and assisted the patient with the [facility] Maneuver.  She got nauseous, nurse medicated with Zofran.  Instructions were printed off and given to patient per Dr. Request so that she could do the maneuver at home.  Patient voiced understanding and is going to let me know if she needs additional instructions.

## 2023-08-02 LAB — BASIC METABOLIC PANEL
ANION GAP: 9 mmol/L (ref 4–13)
BUN/CREA RATIO: 20 (ref 6–22)
BUN: 21 mg/dL (ref 7–25)
CALCIUM: 9.6 mg/dL (ref 8.6–10.3)
CHLORIDE: 103 mmol/L (ref 98–107)
CO2 TOTAL: 27 mmol/L (ref 21–31)
CREATININE: 1.03 mg/dL (ref 0.60–1.30)
ESTIMATED GFR: 57 mL/min/{1.73_m2} — ABNORMAL LOW (ref >59)
GLUCOSE: 117 mg/dL — ABNORMAL HIGH (ref 74–109)
OSMOLALITY, CALCULATED: 282 mOsm/kg (ref 270–290)
POTASSIUM: 4.9 mmol/L (ref 3.5–5.1)
SODIUM: 139 mmol/L (ref 136–145)

## 2023-08-02 LAB — CBC WITH DIFF
BASOPHIL #: 0.1 10*3/uL (ref 0.00–0.10)
BASOPHIL %: 1 % (ref 0–1)
EOSINOPHIL #: 0.3 10*3/uL (ref 0.00–0.50)
EOSINOPHIL %: 5 % (ref 1–7)
HCT: 42.8 % — ABNORMAL HIGH (ref 31.2–41.9)
HGB: 14.2 g/dL (ref 10.9–14.3)
LYMPHOCYTE #: 2.2 10*3/uL (ref 1.00–3.00)
LYMPHOCYTE %: 34 % (ref 16–44)
MCH: 32.7 pg (ref 24.7–32.8)
MCHC: 33.1 g/dL (ref 32.3–35.6)
MCV: 99 fL — ABNORMAL HIGH (ref 75.5–95.3)
MONOCYTE #: 0.5 10*3/uL (ref 0.30–1.00)
MONOCYTE %: 8 % (ref 5–13)
MPV: 11.2 fL — ABNORMAL HIGH (ref 7.9–10.8)
NEUTROPHIL #: 3.4 10*3/uL (ref 1.85–7.80)
NEUTROPHIL %: 52 % (ref 43–77)
PLATELETS: 166 10*3/uL (ref 140–440)
RBC: 4.33 10*6/uL (ref 3.63–4.92)
RDW: 14.5 % (ref 12.3–17.7)
WBC: 6.6 10*3/uL (ref 3.8–11.8)

## 2023-08-02 LAB — POC BLOOD GLUCOSE (RESULTS)
GLUCOSE, POC: 114 mg/dl — ABNORMAL HIGH (ref 70–100)
GLUCOSE, POC: 206 mg/dl — ABNORMAL HIGH (ref 70–100)
GLUCOSE, POC: 146 mg/dl — ABNORMAL HIGH (ref 70–100)
GLUCOSE, POC: 273 mg/dl — ABNORMAL HIGH (ref 70–100)

## 2023-08-02 LAB — MAGNESIUM: MAGNESIUM: 2.1 mg/dL (ref 1.9–2.7)

## 2023-08-02 LAB — ROUTINE STOOL CULTURE (INCLUDING E. COLI SHIGA TOXIN): STOOL CULTURE: NORMAL

## 2023-08-02 MED ORDER — ACYCLOVIR 400 MG TABLET
800.0000 mg | ORAL_TABLET | Freq: Every day | ORAL | Status: DC
Start: 2023-08-02 — End: 2023-08-06
  Administered 2023-08-02 – 2023-08-06 (×20): 800 mg via ORAL
  Filled 2023-08-02 (×20): qty 2

## 2023-08-02 MED ORDER — MECLIZINE 25 MG TABLET
25.0000 mg | ORAL_TABLET | Freq: Three times a day (TID) | ORAL | Status: DC
Start: 2023-08-02 — End: 2023-08-06
  Administered 2023-08-02 – 2023-08-06 (×13): 25 mg via ORAL
  Filled 2023-08-02 (×13): qty 1

## 2023-08-02 MED ORDER — DIPHENHYDRAMINE-ZINC ACETATE 2 %-0.1 % TOPICAL CREAM
TOPICAL_CREAM | Freq: Four times a day (QID) | CUTANEOUS | Status: DC | PRN
Start: 2023-08-02 — End: 2023-08-06
  Filled 2023-08-02: qty 28.4

## 2023-08-02 NOTE — Care Plan (Signed)
Problem: Adult Inpatient Plan of Care  Goal: Plan of Care Review  Outcome: Ongoing (see interventions/notes)  Goal: Patient-Specific Goal (Individualized)  Outcome: Ongoing (see interventions/notes)  Goal: Absence of Hospital-Acquired Illness or Injury  Outcome: Ongoing (see interventions/notes)  Goal: Optimal Comfort and Wellbeing  Outcome: Ongoing (see interventions/notes)  Goal: Rounds/Family Conference  Outcome: Ongoing (see interventions/notes)     Problem: Health Knowledge, Opportunity to Enhance (Adult,Obstetrics,Pediatric)  Goal: Knowledgeable about Health Subject/Topic  Description: Patient will demonstrate the desired outcomes by discharge/transition of care.  Outcome: Ongoing (see interventions/notes)     Problem: Fluid Volume Excess  Goal: Fluid Balance  Outcome: Ongoing (see interventions/notes)     Problem: Fall Injury Risk  Goal: Absence of Fall and Fall-Related Injury  Outcome: Ongoing (see interventions/notes)

## 2023-08-02 NOTE — Nurses Notes (Signed)
Day shift charge nurse reported that day provider stated he would order Acyclovir for this patient's rash that has developed under her breast. No current order noted in chart. Informed night provider of this report.

## 2023-08-02 NOTE — Care Plan (Signed)
Problem: Adult Inpatient Plan of Care  Goal: Plan of Care Review  08/02/2023 1220 by Nicolette Bang, RN  Outcome: Ongoing (see interventions/notes)  08/02/2023 0951 by Nicolette Bang, RN  Outcome: Ongoing (see interventions/notes)  08/02/2023 0950 by Nicolette Bang, RN  Outcome: Ongoing (see interventions/notes)  Goal: Patient-Specific Goal (Individualized)  08/02/2023 1220 by Nicolette Bang, RN  Outcome: Ongoing (see interventions/notes)  08/02/2023 0951 by Nicolette Bang, RN  Outcome: Ongoing (see interventions/notes)  08/02/2023 0950 by Nicolette Bang, RN  Outcome: Ongoing (see interventions/notes)  Goal: Absence of Hospital-Acquired Illness or Injury  08/02/2023 1220 by Nicolette Bang, RN  Outcome: Ongoing (see interventions/notes)  08/02/2023 0951 by Nicolette Bang, RN  Outcome: Ongoing (see interventions/notes)  08/02/2023 0950 by Nicolette Bang, RN  Outcome: Ongoing (see interventions/notes)  Goal: Optimal Comfort and Wellbeing  08/02/2023 1220 by Nicolette Bang, RN  Outcome: Ongoing (see interventions/notes)  08/02/2023 0951 by Nicolette Bang, RN  Outcome: Ongoing (see interventions/notes)  08/02/2023 0950 by Nicolette Bang, RN  Outcome: Ongoing (see interventions/notes)  Goal: Rounds/Family Conference  08/02/2023 1220 by Nicolette Bang, RN  Outcome: Ongoing (see interventions/notes)  08/02/2023 0951 by Nicolette Bang, RN  Outcome: Ongoing (see interventions/notes)  08/02/2023 0950 by Nicolette Bang, RN  Outcome: Ongoing (see interventions/notes)     Problem: Health Knowledge, Opportunity to Enhance (Adult,Obstetrics,Pediatric)  Goal: Knowledgeable about Health Subject/Topic  Description: Patient will demonstrate the desired outcomes by discharge/transition of care.  08/02/2023 1220 by Nicolette Bang, RN  Outcome: Ongoing (see interventions/notes)  08/02/2023 0951 by Nicolette Bang, RN  Outcome: Ongoing (see interventions/notes)  08/02/2023 0950 by Nicolette Bang, RN  Outcome: Ongoing (see interventions/notes)     Problem: Fluid Volume  Excess  Goal: Fluid Balance  08/02/2023 1220 by Nicolette Bang, RN  Outcome: Ongoing (see interventions/notes)  08/02/2023 0951 by Nicolette Bang, RN  Outcome: Ongoing (see interventions/notes)  08/02/2023 0950 by Nicolette Bang, RN  Outcome: Ongoing (see interventions/notes)     Problem: Fall Injury Risk  Goal: Absence of Fall and Fall-Related Injury  08/02/2023 1220 by Nicolette Bang, RN  Outcome: Ongoing (see interventions/notes)  08/02/2023 0951 by Nicolette Bang, RN  Outcome: Ongoing (see interventions/notes)  08/02/2023 0950 by Nicolette Bang, RN  Outcome: Ongoing (see interventions/notes)

## 2023-08-02 NOTE — Care Plan (Signed)
Problem: Adult Inpatient Plan of Care  Goal: Plan of Care Review  08/02/2023 0951 by Nicolette Bang, RN  Outcome: Ongoing (see interventions/notes)  08/02/2023 0950 by Nicolette Bang, RN  Outcome: Ongoing (see interventions/notes)  Goal: Patient-Specific Goal (Individualized)  08/02/2023 0951 by Nicolette Bang, RN  Outcome: Ongoing (see interventions/notes)  08/02/2023 0950 by Nicolette Bang, RN  Outcome: Ongoing (see interventions/notes)  Goal: Absence of Hospital-Acquired Illness or Injury  08/02/2023 0951 by Nicolette Bang, RN  Outcome: Ongoing (see interventions/notes)  08/02/2023 0950 by Nicolette Bang, RN  Outcome: Ongoing (see interventions/notes)  Goal: Optimal Comfort and Wellbeing  08/02/2023 0951 by Nicolette Bang, RN  Outcome: Ongoing (see interventions/notes)  08/02/2023 0950 by Nicolette Bang, RN  Outcome: Ongoing (see interventions/notes)  Goal: Rounds/Family Conference  08/02/2023 0951 by Nicolette Bang, RN  Outcome: Ongoing (see interventions/notes)  08/02/2023 0950 by Nicolette Bang, RN  Outcome: Ongoing (see interventions/notes)     Problem: Health Knowledge, Opportunity to Enhance (Adult,Obstetrics,Pediatric)  Goal: Knowledgeable about Health Subject/Topic  Description: Patient will demonstrate the desired outcomes by discharge/transition of care.  08/02/2023 0951 by Nicolette Bang, RN  Outcome: Ongoing (see interventions/notes)  08/02/2023 0950 by Nicolette Bang, RN  Outcome: Ongoing (see interventions/notes)     Problem: Fluid Volume Excess  Goal: Fluid Balance  08/02/2023 0951 by Nicolette Bang, RN  Outcome: Ongoing (see interventions/notes)  08/02/2023 0950 by Nicolette Bang, RN  Outcome: Ongoing (see interventions/notes)     Problem: Fall Injury Risk  Goal: Absence of Fall and Fall-Related Injury  08/02/2023 0951 by Nicolette Bang, RN  Outcome: Ongoing (see interventions/notes)  08/02/2023 0950 by Nicolette Bang, RN  Outcome: Ongoing (see interventions/notes)

## 2023-08-02 NOTE — Progress Notes (Signed)
[place] [facility]    HOSPITALIST PROGRESS NOTE    [name]  Date of service: 08/02/2023  Date of Admission:  07/29/2023  Hospital Day:  LOS: 4 days     Subjective:   Patient reports overall improvement.  She states she is feeling some dizziness but with overall improvement.  No nausea vomiting or focal weakness reported.  Chest pain shortness on breath reported.  No other new complaints reported except for borderline blood pressure this morning.    Vital Signs:  Filed Vitals:    08/01/23 2147 08/02/23 0008 08/02/23 0021 08/02/23 0820   BP:  (!) 84/56 (!) 106/56 127/63   Pulse: 58 98 51 68   Resp:  13 14 16    Temp:  36.3 C (97.3 F) 36.3 C (97.4 F) 36.3 C (97.3 F)   SpO2:  (!) 89% 98% 97%        Physical Exam:  General:  Patient in NAD, resting in bed, no visitors present  Head:  Normocephalic, atraumatic  Eyes:  PERRL, anicteric sclera  ENT:  Oral mucosa moist, no nasal discharge   Neck:  Soft, supple, trachea midline  Heart:  RRR, S1 and S2 normal  Lungs:  Unlabored respirations.  Lungs are clear to auscultation bilaterally, with no wheezes, no rales, no conversational dyspnea  Abdomen:  Soft, active bowel sounds, non-tender to palpation, non-distended  Extremities:  Pulses equal bilaterally.  Capillary refill less than 3 seconds.  No edema in lower extremities bilaterally   Skin:  Warm and dry, not diaphoretic.  No ecchymosis noted.   Neuro:  A&O x 3.  No focal deficits.  Speech intact  Psych:  Cooperative, not agitated    Intake & Output:    Intake/Output Summary (Last 24 hours) at 08/02/2023 1132  Last data filed at 08/01/2023 1800  Gross per 24 hour   Intake 480 ml   Output --   Net 480 ml     I/O current shift:  No intake/output data recorded.  Emesis:    BM:    Date of Last Bowel Movement: 07/30/23  Heme:      acetaminophen (TYLENOL) tablet, 650 mg, Oral, Q4H PRN  atorvastatin (LIPITOR) tablet, 40 mg, Oral, QPM  Correction/SSIP insulin lispro (HumaLOG) 100 units/mL injection,  0-18 Units, Subcutaneous, 4x/day AC  dapagliflozin (FARXIGA) tablet, 10 mg, Oral, Daily  dextrose (GLUTOSE) 40% oral gel, 15 g, Oral, Q15 Min PRN  dextrose 50% (0.5 g/mL) injection - syringe, 12.5 g, Intravenous, Q15 Min PRN  [Held by provider] diazePAM (VALIUM) tablet, 5 mg, Oral, Daily  docusate sodium (COLACE) capsule, 100 mg, Oral, 2x/day PRN  enalapril (VASOTEC) tablet, 10 mg, Oral, Daily  [START ON 08/03/2023] ergocalciferol-vitamin D2 (DRISDOL) capsule, 50,000 Units, Oral, Q7 Days  glucagon (GLUCAGEN DIAGNOSTIC KIT) injection 1 mg, 1 mg, IntraMUSCULAR, Once PRN  heparin 5,000 unit/mL injection, 5,000 Units, Subcutaneous, Q8HRS  HYDROcodone-acetaminophen (NORCO) 7.5 mg-325 mg per tablet, 1 Tablet, Oral, Q6H PRN  insulin glargine 100 units/mL injection, 10 Units, Subcutaneous, NIGHTLY  latanoprost (XALATAN) 0.005 % ophthalmic solution, 1 Drop, Left Eye, NIGHTLY  Levetiracetam Tablet Sustained Release 24 hr 500 mg, 1 Tablet, Oral, NIGHTLY  magnesium oxide (MAG-OX) 400mg  (241.3 mg elemental magnesium) tablet, 800 mg, Oral, Daily  meclizine (ANTIVERT) tablet, 12.5 mg, Oral, Q8H PRN  NS flush syringe, 3 mL, Intracatheter, Q8HRS  NS flush syringe, 3 mL, Intracatheter, Q1H PRN  ondansetron (ZOFRAN) 2 mg/mL injection, 4 mg, Intravenous, Q6H PRN  SUMAtriptan (IMITREX)  tablet, 50 mg, Oral, Once PRN  venlafaxine (EFFEXOR XR) 24 hr extended release capsule, 37.5 mg, Oral, NIGHTLY          Labs:  Recent Results (from the past 48 hour(s))   CBC WITH DIFF    Collection Time: 08/02/23  3:06 AM   Result Value    WBC 6.6    HGB 14.2    HCT 42.8 (H)    PLATELETS 166      Results for orders placed or performed during the hospital encounter of 07/29/23 (from the past 48 hour(s))   BASIC METABOLIC PANEL    Collection Time: 08/02/23  8:12 AM   Result Value    SODIUM 139    POTASSIUM 4.9    CHLORIDE 103    CO2 TOTAL 27    GLUCOSE 117 (H)    BUN 21    CREATININE 1.03      Recent Results (from the past 48 hour(s))   COMPREHENSIVE  METABOLIC PANEL, NON-FASTING    Collection Time: 08/01/23  5:25 AM   Result Value    ALKALINE PHOSPHATASE 70    ALT (SGPT) 14    AST (SGOT) 15      No results found for this or any previous visit (from the past 48 hour(s)).     No results found for this or any previous visit (from the past 48 hour(s)).     Results for orders placed or performed in visit on 07/08/23 (from the past 1344 hour(s))   HGA1C (HEMOGLOBIN A1C WITH EST AVG GLUCOSE)    Collection Time: 07/08/23  1:42 PM   Result Value    HEMOGLOBIN A1C 9.5 (H)      No results found for this or any previous visit (from the past 48 hour(s)).     Microbiology:  Hospital Encounter on 07/29/23 (from the past 96 hour(s))   ADULT ROUTINE BLOOD CULTURE, SET OF 2 ADULT BOTTLES (BACTERIA AND YEAST)    Collection Time: 07/29/23 12:38 PM    Specimen: Blood   Culture Result Status    BLOOD CULTURE, ROUTINE No Growth 3 Days Preliminary   ADULT ROUTINE BLOOD CULTURE, SET OF 2 ADULT BOTTLES (BACTERIA AND YEAST)    Collection Time: 07/29/23 12:39 PM    Specimen: Blood   Culture Result Status    BLOOD CULTURE, ROUTINE No Growth 3 Days Preliminary   ROUTINE STOOL CULTURE (INCLUDING E. COLI SHIGA TOXIN)    Collection Time: 07/30/23  6:26 AM    Specimen: Stool   Culture Result Status    STOOL CULTURE 3+ Several Normal Fecal Flora Preliminary   C. DIFFICILE PCR    Collection Time: 07/30/23  6:26 AM    Specimen: Stool   Culture Result Status    C. DIFFICILE TOXIN GENE, PCR Negative Final    PRESUMPTIVE 027/NAP1/BI Negative Final       Imaging:   TRANSTHORACIC ECHOCARDIOGRAM - ADULT  **See full report in linked PDF document**    S  [facility]                                                                                          [address] [place], [place] [address]                                                                                     Transthoracic  Echocardiographic Report              ______________________________________________________________________________  Name: ELLSIE, GOOKIN                                  MRN: [id number]               Weight: 197 lb  Study Date: 07/30/2023 03:02 PM                         DOB: [birth year]             Height: 65 in  Gender: Female                                          Age: 72 yrs                 BSA: 2.0 m2  Accession #: [id number]                              BP: 133/74 mmHg  Patient Location: PRN NON INVASIVE CARD PRN  Ordering Provider: Omelia Blackwater  TechLeavy Cella              ______________________________________________________________________________  Procedure:  Transthoracic complete echo, 2D, spectral and tissue Doppler, color flow Doppler, M-mode. Agitated saline bubble study was performed.    Quality:  The study images were of technically good quality.    Indications: CVA (cerebral vascular accident) (CMS HCC)    Conclusions:  Normal left ventricular size.  The left ventricular ejection fraction was calculated using the biplane Simpson`s rule method.  Left ventricular systolic function is normal.  Left ventricular diastolic parameters are normal.  Agitated saline bubble study is negative for intracardiac shunt.    Ejection Fraction is 53.5 %.    Findings  Left Ventricle:   Normal left ventricular size. Normal geometry. Ejection Fraction is 53.5 %. Left ventricular systolic function is normal. The left ventricular ejection fraction was calculated using  the biplane Simpson`s rule method. Regional wall motion abnormalities cannot be excluded due to limited visualization. Left ventricular diastolic parameters are normal. The global longitudinal strain  was calculated at -  13.1.  Right Ventricle:   Normal right ventricular size. Normal right ventricular systolic function. RV systolic pressure is at the upper limits of normal.  Left Atrium:   The left atrium is normal in size.  Right Atrium:   The  right atrium is of normal size.  Mitral Valve:   No significant mitral regurgitation present.  Tricuspid Valve:   There is mild tricuspid regurgitation.  Aortic Valve:   The aortic valve is not well visualized. The peak velocity across the aortic valve is 176.5 cm/sec. No Aortic valve stenosis.  Pulmonic Valve:   No significant pulmonic valve regurgitation present.  Atrial Septum:   Agitated saline bubble study is negative for intracardiac shunt.  IVC/Hepatic Veins:   Normal IVC size with >50% inspiratory collapse (estimated RA pressure 3 mmHg).  Pericardium/Pleural space:   Normal pericardium with no pericardial effusion.    Electronically signed by: MD ABBAS ALI on 07/30/2023 05:43 PM  MRA INTRACRANIAL WO CONTRAST  Narrative: Eber Jones S Fullen    RADIOLOGIST: Karolee Stamps    MRA INTRACRANIAL WO CONTRAST performed on 07/30/2023 11:41 AM    CLINICAL HISTORY: Stroke.  weakness and unsteady gait for 3 days, coughs and sweat for 1 week, intermittent vertigo, decreased blood sugar levels (diabetic) hx of 1 seizure and migraines    TECHNIQUE:  3D Time of Flight MRA of the head without intravenous contrast.  Axial and 3D reformatted images.    COMPARISON: Noncontrasted CT from 07/29/2023    FINDINGS:  Anatomy:  Circle of Willis anatomy is within normal limits.    Anterior Circulation:  Distal internal carotid, anterior and middle cerebral arteries appear patent without high grade stenosis.  Atherosclerotic changes of the bilateral intracranial internal carotid and left middle cerebral arteries present No large aneurysms are identified.    Posterior Circulation:  The visualized distal most vertebral, the basilar and the posterior cerebral arteries appear patent without high grade stenosis.  No large aneurysms are identified.     Impression: 1. No hemodynamically significant stenosis or major arterial vascular occlusion involving the circle of Willis.  2. Atherosclerotic changes.    Radiologist location ID: [id number]  MRA  CAROTID-EXTRACRANIAL (NECK) W/WO CONTRAST  Narrative: Uvaldo Rising Querry    RADIOLOGIST: Valda Favia, MD    MRA CAROTID-EXTRACRANIAL (NECK) W/WO CONTRAST performed on 07/30/2023 11:45 AM    CLINICAL HISTORY: Stroke.  weakness and unsteady gait for 3 days, coughs and sweat for 1 week, intermittent vertigo, decreased blood sugar levels (diabetic) hx of 1 seizure and migraines    TECHNIQUE:  Neck MRA using 2D and 3D Time of Flight technique and with intravenous gadolinium-based contrast.    INTRAVENOUS CONTRAST: 10 ml's of Gadavist    COMPARISON:  None.    FINDINGS:  Flow:  2D Time of Flight images demonstrate antegrade carotid and vertebral artery flow.    Carotids:  No evidence of significant stenosis on time of flight or postcontrast images.           Right ICA stenosis (NASCET):  10 %         Left ICA stenosis (NASCET):  20 %    Vertebrals:  Codominant without evidence of high grade stenosis.    Arch:  No significant stenosis identified at the arch vessel origins, brachiocephalic or proximal subclavian arteries.  Impression: NO EVIDENCE OF SIGNIFICANT CAROTID OR VERTEBRAL ARTERY STENOSIS ON NECK MRA.    Radiologist location ID: [id number]  MRI BRAIN WO CONTRAST  Narrative: Jazlynn S Fassnacht  RADIOLOGIST: Alvester Chou, MD    MRI BRAIN WO CONTRAST performed on 07/30/2023 11:47 AM    CLINICAL HISTORY: TIA.  weakness, unsteady gait x3days, cough and sweat for 1 week, intermittent vertigo, decreased blood sugar levels (diabetic), hx of 1 seizure 5 yrs ago and migraines    TECHNIQUE: Noncontrast brain MRI.    COMPARISON:  None.    FINDINGS:  Brain: Ventricular and sulcal prominence is present with moderate cerebral volume loss. There are multiple foci of T2 prolongation cerebral white matter as well as in the pons which are likely due to chronic microvascular changes. There is an area of T2 prolongation in the right frontal subcortical white matter likely due to an old infarct.    Diffusion weighted images show no  evidence of acute or recent infarct.     Ventricles: Consistent with the overall degree of cerebral atrophy.    Major Intracranial Vessels: Normal flow voids.    Sinuses: Clear.  Mastoids: Clear.  Impression: 1. NO ACUTE OR SUBACUTE INFARCT  2. 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 THIS IS LIKELY DUE TO CHRONIC MICROVASCULAR CHANGES  3. ABNORMAL T2 PROLONGATION IN RIGHT FRONTAL SUBCORTICAL WHITE MATTER LIKELY AN OLD INFARCT    Radiologist location ID: [id number]        Assessment/ Plan:   Active Hospital Problems   (*Primary Problem)    Diagnosis    *TIA (transient ischemic attack)    Asymptomatic bradycardia    Diarrhea    Vertigo    Uncontrolled type 2 diabetes mellitus with hyperglycemia (CMS HCC)    Hypertension     72 year old female with past medical history of TIA hypertension hyperlipidemia diabetes mellitus coming in with unsteady gait that was preceded with nausea vomiting and diarrhea.  Symptoms suggestive of probable CVA in view of her risk factors but rule out vasovagal and all medications as possible etiologies.     -CVA vs TIA rule out posterior stroke  Telemetry monitor  History of A-Fib, no  TPA candidate, no  Dysphagia screen:  Passed  PT/OT evaluation  2D echo, EF 50-55% no intracardiac shunts, MRI and MRA head and neck were unremarkable for acute findings  CT head was unremarkable for acute findings  Frequent neuro checks  Maintain SBP 150-160 mmHg  Follow-up lipid panel  Neurology consulted on arrival in the ED.  We consult Cardiology who believe this is likely vasovagal.  Recommending follow up with Cardiology as outpatient.    Holter monitor on d/c.     -Unsteady gait with dizziness, rule out BPPV  Orthostatic vital signs  Meclizine  We will check , B6, B1 pending. Folate and syphilis, B12and HIV unremarkable.  Seizure precautions  Encourage oral hydration  Epley maneuver, performed yesterday.  We will try again and follow up with a PT when  services available tomorrow.  PT evaluation    -Bradycardia with dizziness  As per Cardiology bradycardia is likely vagal response.  No need for pacemaker at this time.  Patient follow up with the Cardiology as outpatient per cardiology service.    -Hyperkalemia  Lokelma offered  We will continue to monitor BM     -Diabetes mellitus  hemoglobin A1c, 9.5 on 07/08/23  Lantus with insulin sliding scale fingerstick blood sugar a.c. HS     -Hyperlipidemia  Lipid panel performed in July/2024 noted.  Continue with home medications including statins     -Hypertension, controlled  Continue with Vasotec, to adjust medications as  blood pressure responds.     -History of seizures  Continue with Keppra     -Age-related physical debility  PT OT evaluation for discharge disposition planning     For other chronic conditions, will resume home medications upon medication reconciliation.     Code status: Full Code  DVT prophylaxis:  SCDs    Diet: DIET DIABETIC Calorie amount: CC 1800; Do you want to initiate MNT Protocol? Yes      Disposition Planning:  Home in 1-2 days depending on clinical course.    Omelia Blackwater, MD  08/02/2023  [facility] H[facility]

## 2023-08-03 LAB — POC BLOOD GLUCOSE (RESULTS)
GLUCOSE, POC: 127 mg/dl — ABNORMAL HIGH (ref 70–100)
GLUCOSE, POC: 183 mg/dl — ABNORMAL HIGH (ref 70–100)
GLUCOSE, POC: 186 mg/dl — ABNORMAL HIGH (ref 70–100)
GLUCOSE, POC: 237 mg/dl — ABNORMAL HIGH (ref 70–100)

## 2023-08-03 LAB — ADULT ROUTINE BLOOD CULTURE, SET OF 2 BOTTLES (BACTERIA AND YEAST)
BLOOD CULTURE, ROUTINE: NO GROWTH
BLOOD CULTURE, ROUTINE: NO GROWTH

## 2023-08-03 NOTE — Progress Notes (Signed)
[place] MEDICINE [facility]    HOSPITALIST PROGRESS NOTE    Grace Hoffman  Date of service: 08/03/2023  Date of Admission:  07/29/2023  Hospital Day:  LOS: 5 days     Subjective:   Patient reports some improvement in her dizziness but she was generalized weakness with the unsteady gait.  States that is follows with a neurologist who placed on Keppra.  She was also noted to take Librium.  She has a rash noted 3 areas and her left breast.  Reports some itching with a burning sensation.  No fever chills nausea vomiting chest pain or shortness for breath reported.    Vital Signs:  Filed Vitals:    08/02/23 2332 08/02/23 2344 08/03/23 0722 08/03/23 1057   BP:  121/70 138/60    Pulse: 60 68 60 65   Resp:  14 16    Temp:  36.1 C (97 F) 36.2 C (97.2 F)    SpO2:  94% 94%         Physical Exam:  General:  Patient in NAD, resting in bed, no visitors present  Head:  Normocephalic, atraumatic  Eyes:  PERRL, anicteric sclera  ENT:  Oral mucosa moist, no nasal discharge   Neck:  Soft, supple, trachea midline  Heart:  RRR, S1 and S2 normal  Lungs:  Unlabored respirations.  Lungs are clear to auscultation bilaterally, with no wheezes, no rales, no conversational dyspnea  Abdomen:  Soft, active bowel sounds, non-tender to palpation, non-distended  Extremities:  Pulses equal bilaterally.  Capillary refill less than 3 seconds.  No edema in lower extremities bilaterally   Skin:  Warm and dry, not diaphoretic.  No ecchymosis noted.  Erythema noted left under breast.  Neuro:  A&O x 3.  No focal deficits.  Speech intact  Psych:  Cooperative, not agitated    Intake & Output:    Intake/Output Summary (Last 24 hours) at 08/03/2023 1119  Last data filed at 08/02/2023 1800  Gross per 24 hour   Intake 600 ml   Output --   Net 600 ml     I/O current shift:  No intake/output data recorded.  Emesis:    BM:    Date of Last Bowel Movement: 07/30/23  Heme:      acetaminophen (TYLENOL) tablet, 650 mg, Oral, Q4H PRN  acyclovir (ZOVIRAX)  400 mg tablet, 800 mg, Oral, 5x/day  atorvastatin (LIPITOR) tablet, 40 mg, Oral, QPM  Correction/SSIP insulin lispro (HumaLOG) 100 units/mL injection, 0-18 Units, Subcutaneous, 4x/day AC  dapagliflozin (FARXIGA) tablet, 10 mg, Oral, Daily  dextrose (GLUTOSE) 40% oral gel, 15 g, Oral, Q15 Min PRN  dextrose 50% (0.5 g/mL) injection - syringe, 12.5 g, Intravenous, Q15 Min PRN  [Held by provider] diazePAM (VALIUM) tablet, 5 mg, Oral, Daily  diphenhydrAMINE-zinc acetate (BENADRYL EXTRA STRENGTH) 2% topical cream, , Apply Topically, 4x/day PRN  docusate sodium (COLACE) capsule, 100 mg, Oral, 2x/day PRN  enalapril (VASOTEC) tablet, 10 mg, Oral, Daily  ergocalciferol-vitamin D2 (DRISDOL) capsule, 50,000 Units, Oral, Q7 Days  glucagon (GLUCAGEN DIAGNOSTIC KIT) injection 1 mg, 1 mg, IntraMUSCULAR, Once PRN  heparin 5,000 unit/mL injection, 5,000 Units, Subcutaneous, Q8HRS  HYDROcodone-acetaminophen (NORCO) 7.5 mg-325 mg per tablet, 1 Tablet, Oral, Q6H PRN  insulin glargine 100 units/mL injection, 10 Units, Subcutaneous, NIGHTLY  latanoprost (XALATAN) 0.005 % ophthalmic solution, 1 Drop, Left Eye, NIGHTLY  Levetiracetam Tablet Sustained Release 24 hr 500 mg, 1 Tablet, Oral, NIGHTLY  magnesium oxide (MAG-OX) 400mg  (241.3 mg elemental magnesium)  tablet, 800 mg, Oral, Daily  meclizine (ANTIVERT) tablet, 25 mg, Oral, Q8HRS  NS flush syringe, 3 mL, Intracatheter, Q8HRS  NS flush syringe, 3 mL, Intracatheter, Q1H PRN  ondansetron (ZOFRAN) 2 mg/mL injection, 4 mg, Intravenous, Q6H PRN  SUMAtriptan (IMITREX) tablet, 50 mg, Oral, Once PRN  venlafaxine (EFFEXOR XR) 24 hr extended release capsule, 37.5 mg, Oral, NIGHTLY          Labs:  Recent Results (from the past 48 hour(s))   CBC WITH DIFF    Collection Time: 08/02/23  3:06 AM   Result Value    WBC 6.6    HGB 14.2    HCT 42.8 (H)    PLATELETS 166      Results for orders placed or performed during the hospital encounter of 07/29/23 (from the past 48 hour(s))   BASIC METABOLIC PANEL     Collection Time: 08/02/23  8:12 AM   Result Value    SODIUM 139    POTASSIUM 4.9    CHLORIDE 103    CO2 TOTAL 27    GLUCOSE 117 (H)    BUN 21    CREATININE 1.03      No results found for this or any previous visit (from the past 48 hour(s)).     No results found for this or any previous visit (from the past 48 hour(s)).     No results found for this or any previous visit (from the past 48 hour(s)).     Results for orders placed or performed in visit on 07/08/23 (from the past 1344 hour(s))   HGA1C (HEMOGLOBIN A1C WITH EST AVG GLUCOSE)    Collection Time: 07/08/23  1:42 PM   Result Value    HEMOGLOBIN A1C 9.5 (H)      No results found for this or any previous visit (from the past 48 hour(s)).     Microbiology:  No results found for any visits on 07/29/23 (from the past 96 hour(s)).      Imaging:   TRANSTHORACIC ECHOCARDIOGRAM - ADULT  **See full report in linked PDF document**    S                                                                                                [facility]                                                                                          [address] [place], [place] [address]  Transthoracic Echocardiographic Report              ______________________________________________________________________________  Name: Grace Hoffman, Grace Hoffman                                  MRN: [id number]               Weight: 197 lb  Study Date: 07/30/2023 03:02 PM                         DOB: 12/02/1951             Height: 65 in  Gender: Female                                          Age: 72 yrs                 BSA: 2.0 m2  Accession #: [id number]                              BP: 133/74 mmHg  Patient Location: PRN NON INVASIVE CARD PRN  Ordering Provider: Omelia Blackwater  Tech: Leavy Cella              ______________________________________________________________________________  Procedure:  Transthoracic  complete echo, 2D, spectral and tissue Doppler, color flow Doppler, M-mode. Agitated saline bubble study was performed.    Quality:  The study images were of technically good quality.    Indications: CVA (cerebral vascular accident) (CMS HCC)    Conclusions:  Normal left ventricular size.  The left ventricular ejection fraction was calculated using the biplane Simpson`s rule method.  Left ventricular systolic function is normal.  Left ventricular diastolic parameters are normal.  Agitated saline bubble study is negative for intracardiac shunt.    Ejection Fraction is 53.5 %.    Findings  Left Ventricle:   Normal left ventricular size. Normal geometry. Ejection Fraction is 53.5 %. Left ventricular systolic function is normal. The left ventricular ejection fraction was calculated using  the biplane Simpson`s rule method. Regional wall motion abnormalities cannot be excluded due to limited visualization. Left ventricular diastolic parameters are normal. The global longitudinal strain  was calculated at -13.1.  Right Ventricle:   Normal right ventricular size. Normal right ventricular systolic function. RV systolic pressure is at the upper limits of normal.  Left Atrium:   The left atrium is normal in size.  Right Atrium:   The right atrium is of normal size.  Mitral Valve:   No significant mitral regurgitation present.  Tricuspid Valve:   There is mild tricuspid regurgitation.  Aortic Valve:   The aortic valve is not well visualized. The peak velocity across the aortic valve is 176.5 cm/sec. No Aortic valve stenosis.  Pulmonic Valve:   No significant pulmonic valve regurgitation present.  Atrial Septum:   Agitated saline bubble study is negative for intracardiac shunt.  IVC/Hepatic Veins:   Normal IVC size with >50% inspiratory collapse (estimated RA pressure 3 mmHg).  Pericardium/Pleural space:   Normal pericardium with no pericardial effusion.    Electronically signed by: MD ABBAS ALI on 07/30/2023 05:43 PM  MRA  INTRACRANIAL WO CONTRAST  Narrative: Grace Hoffman    RADIOLOGIST: Karolee Stamps    MRA  INTRACRANIAL WO CONTRAST performed on 07/30/2023 11:41 AM    CLINICAL HISTORY: Stroke.  weakness and unsteady gait for 3 days, coughs and sweat for 1 week, intermittent vertigo, decreased blood sugar levels (diabetic) hx of 1 seizure and migraines    TECHNIQUE:  3D Time of Flight MRA of the head without intravenous contrast.  Axial and 3D reformatted images.    COMPARISON: Noncontrasted CT from 07/29/2023    FINDINGS:  Anatomy:  Circle of Willis anatomy is within normal limits.    Anterior Circulation:  Distal internal carotid, anterior and middle cerebral arteries appear patent without high grade stenosis.  Atherosclerotic changes of the bilateral intracranial internal carotid and left middle cerebral arteries present No large aneurysms are identified.    Posterior Circulation:  The visualized distal most vertebral, the basilar and the posterior cerebral arteries appear patent without high grade stenosis.  No large aneurysms are identified.     Impression: 1. No hemodynamically significant stenosis or major arterial vascular occlusion involving the circle of Willis.  2. Atherosclerotic changes.    Radiologist location ID: [id number]  MRA CAROTID-EXTRACRANIAL (NECK) W/WO CONTRAST  Narrative: Grace Hoffman    RADIOLOGIST: Valda Favia, MD    MRA CAROTID-EXTRACRANIAL (NECK) W/WO CONTRAST performed on 07/30/2023 11:45 AM    CLINICAL HISTORY: Stroke.  weakness and unsteady gait for 3 days, coughs and sweat for 1 week, intermittent vertigo, decreased blood sugar levels (diabetic) hx of 1 seizure and migraines    TECHNIQUE:  Neck MRA using 2D and 3D Time of Flight technique and with intravenous gadolinium-based contrast.    INTRAVENOUS CONTRAST: 10 ml's of Gadavist    COMPARISON:  None.    FINDINGS:  Flow:  2D Time of Flight images demonstrate antegrade carotid and vertebral artery flow.    Carotids:  No evidence of significant  stenosis on time of flight or postcontrast images.           Right ICA stenosis (NASCET):  10 %         Left ICA stenosis (NASCET):  20 %    Vertebrals:  Codominant without evidence of high grade stenosis.    Arch:  No significant stenosis identified at the arch vessel origins, brachiocephalic or proximal subclavian arteries.  Impression: NO EVIDENCE OF SIGNIFICANT CAROTID OR VERTEBRAL ARTERY STENOSIS ON NECK MRA.    Radiologist location ID: [id number]  MRI BRAIN WO CONTRAST  Narrative: Grace Hoffman    RADIOLOGIST: Alvester Chou, MD    MRI BRAIN WO CONTRAST performed on 07/30/2023 11:47 AM    CLINICAL HISTORY: TIA.  weakness, unsteady gait x3days, cough and sweat for 1 week, intermittent vertigo, decreased blood sugar levels (diabetic), hx of 1 seizure 5 yrs ago and migraines    TECHNIQUE: Noncontrast brain MRI.    COMPARISON:  None.    FINDINGS:  Brain: Ventricular and sulcal prominence is present with moderate cerebral volume loss. There are multiple foci of T2 prolongation cerebral white matter as well as in the pons which are likely due to chronic microvascular changes. There is an area of T2 prolongation in the right frontal subcortical white matter likely due to an old infarct.    Diffusion weighted images show no evidence of acute or recent infarct.     Ventricles: Consistent with the overall degree of cerebral atrophy.    Major Intracranial Vessels: Normal flow voids.    Sinuses: Clear.  Mastoids: Clear.  Impression: 1. NO ACUTE OR SUBACUTE INFARCT  2. MODERATE  CEREBRAL ATROPHY WITH NONSPECIFIC WHITE MATTER AND PONTINE SIGNAL ABNORMALITIES. THIS IS LIKELY DUE TO CHRONIC MICROVASCULAR CHANGES  3. ABNORMAL T2 PROLONGATION IN RIGHT FRONTAL SUBCORTICAL WHITE MATTER LIKELY AN OLD INFARCT    Radiologist location ID: [id number]        Assessment/ Plan:   Active Hospital Problems   (*Primary Problem)    Diagnosis    *TIA (transient ischemic attack)    Asymptomatic bradycardia    Diarrhea    Vertigo     Uncontrolled type 2 diabetes mellitus with hyperglycemia (CMS HCC)    Hypertension     72 year old female with past medical history of TIA hypertension hyperlipidemia diabetes mellitus coming in with unsteady gait that was preceded with nausea vomiting and diarrhea.  Symptoms suggestive of probable CVA in view of her risk factors but rule out vasovagal and all medications as possible etiologies.     -CVA vs TIA rule out posterior stroke  Telemetry monitor  History of A-Fib, no  TPA candidate, no  Dysphagia screen:  Passed  PT/OT evaluation  2D echo, EF 50-55% no intracardiac shunts, MRI and MRA head and neck were unremarkable for acute findings  CT head was unremarkable for acute findings  Frequent neuro checks  Maintain SBP 150-160 mmHg  Follow-up lipid panel  Neurology consulted on arrival in the ED.  We consult Cardiology who believe this is likely vasovagal.  Recommending follow up with Cardiology as outpatient.    Holter monitor on d/c.     -Unsteady gait with dizziness, rule out BPPV  Orthostatic vital signs  Meclizine  We will check , B6, B1 pending. Folate and syphilis, B12and HIV unremarkable.  Seizure precautions  Encourage oral hydration  Epley maneuver, performed yesterday.  We will try again and follow up with a PT/OT.  PT evaluation  Patient on Keppra and Librium.    -Bradycardia with dizziness  As per Cardiology bradycardia is likely vagal response.  No need for pacemaker at this time.  Patient follow up with the Cardiology as outpatient per cardiology service.    -Hyperkalemia  Lokelma offered  We will continue to monitor BM     -Diabetes mellitus  hemoglobin A1c, 9.5 on 07/08/23  Lantus with insulin sliding scale fingerstick blood sugar a.c. HS     -Hyperlipidemia  Lipid panel performed in July/2024 noted.  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     -History of seizures  Continue with Keppra     -Age-related physical debility  PT OT evaluation for discharge disposition planning recommending inpatient rehab.     For other chronic conditions, will resume home medications upon medication reconciliation.     Code status: Full Code  DVT prophylaxis:  SCDs    Diet: DIET DIABETIC Calorie amount: CC 1800; Do you want to initiate MNT Protocol? Yes      Disposition Planning:  Home in 1-2 days depending on clinical course.    Omelia Blackwater, MD  08/03/2023  [place] [facility] MEDICINE HOSPITALIST

## 2023-08-03 NOTE — Care Plan (Signed)
Problem: Adult Inpatient Plan of Care  Goal: Plan of Care Review  Outcome: Ongoing (see interventions/notes)  Goal: Patient-Specific Goal (Individualized)  Outcome: Ongoing (see interventions/notes)  Goal: Absence of Hospital-Acquired Illness or Injury  Outcome: Ongoing (see interventions/notes)  Goal: Optimal Comfort and Wellbeing  Outcome: Ongoing (see interventions/notes)  Goal: Rounds/Family Conference  Outcome: Ongoing (see interventions/notes)     Problem: Health Knowledge, Opportunity to Enhance (Adult,Obstetrics,Pediatric)  Goal: Knowledgeable about Health Subject/Topic  Description: Patient will demonstrate the desired outcomes by discharge/transition of care.  Outcome: Ongoing (see interventions/notes)     Problem: Fluid Volume Excess  Goal: Fluid Balance  Outcome: Ongoing (see interventions/notes)     Problem: Fall Injury Risk  Goal: Absence of Fall and Fall-Related Injury  Outcome: Ongoing (see interventions/notes)

## 2023-08-03 NOTE — PT Evaluation (Signed)
[facility]  [address]  [place], [address]  [phone]  (Fax) [phone]  Rehabilitation Services  Physical Therapy Inpatient Initial Evaluation    Patient Name: Grace Hoffman  Date of Birth: November 07, 1951  Height: Height: 165.1 cm (5\' 5" )  Weight: Weight: 87 kg (191 lb 11.2 oz)  Room/Bed: 326/A  Payor: MEDICARE / Plan: MEDICARE PART A AND B / Product Type: Medicare /       PMH:  Past Medical History:   Diagnosis Date    Anxiety     Chronic back pain     COVID-19 vaccine series completed     Depression     Diabetes mellitus, type 2 (CMS HCC)     Fibromyalgia affecting multiple sites     Hypertension     Hypomagnesemia     Insomnia disorder     Migraine     Mixed hyperlipidemia     Orthostatic hypotension     Osteoarthritis     TIA (transient ischemic attack)     Unspecified glaucoma(365.9)     Vitamin D deficiency            Assessment:      Patient was admitted with TIA, diarrhea, vertigo, uncontrolled DM II and HTN. She continues to have dizziness with activity particularly supine to sitting motion. Did not have dizziness from sitting to standing or during ambulation or turns. Dr. Linde Gillis has completed Epley Maneuver for right ear involvement on 08/01/23 and has requested PT continue. This therapist will discuss with DOR and outpatient PT on how tomove forward with physician request. Patient will benefit  from continuedPT services for transfer, ambulation and activity tolerance training during hospital stay to aid a safe discharge to home. Patient discharge not yet determined. may be home with outpatient PT services or inpatient rehab stay.    Discharge Needs:    Equipment Recommendation: front wheeled walker (patient already has available for use at home)      The patient presents with mobility limitations due to impaired functional activity tolerance and dizziness  that significantly impair/prevent patient's ability to participate in mobility-related activities of  daily living (MRADLs) including  ambulation and transfers in order to safely complete, bathing, food preparation, laundering/household tasks. This functional mobility deficit can be improved with the use of a front wheeled walker (patient already has available for use at home)  in order to decrease the risk of falls in performance of these MRADLs.  Patient is able to safely use this assistive device.    Discharge Disposition: TBD, home with outpatient services, inpatient rehabilitation facility    JUSTIFICATION OF DISCHARGE RECOMMENDATION   Based on current diagnosis, functional performance prior to admission, and current functional performance, this patient requires continued PT services in TBD, home with outpatient services, inpatient rehabilitation facility in order to achieve significant functional improvements in these deficit areas: aerobic capacity/endurance, gait, locomotion, and balance, other (see comments) (dizziness with supine to EOB transfer).        Plan:   Current Intervention: (P) gait training, patient/family education, other (see comments)  To provide physical therapy services other (see comments) (1-2x/day at least one day weekly)  for duration of until discharge.    The risks/benefits of therapy have been discussed with the patient/caregiver and he/she is in agreement with the established plan of care.       Subjective & Objective     Past Medical History:   Diagnosis Date  Anxiety     Chronic back pain     COVID-19 vaccine series completed     Depression     Diabetes mellitus, type 2 (CMS HCC)     Fibromyalgia affecting multiple sites     Hypertension     Hypomagnesemia     Insomnia disorder     Migraine     Mixed hyperlipidemia     Orthostatic hypotension     Osteoarthritis     TIA (transient ischemic attack)     Unspecified glaucoma(365.9)     Vitamin D deficiency             Past Surgical History:   Procedure Laterality Date    HX CHOLECYSTECTOMY      HX KNEE REPLACMENT Left     HX ROTATOR  CUFF REPAIR Left     SKIN CANCER EXCISION      forehead removed        08/03/23 0950   Rehab Session   Document Type evaluation   PT Visit Date 08/03/23   General Information   Patient Profile Reviewed yes   Pertinent History of Current Functional Problem Patient to ED on 07/29/23 with dizziness, nausea and vomiting.  Also had diarrhea for several days. She was admitted with TIA, diarrhea, vertigo, uncontrolled DMII and HTN. MRI was negative for infarct. Dr. Linde Gillis completed Epley Maneuver for vertigo on 08/01/23 which elicited her symptoms of dizziness and nausea following treatment.   Medical Lines PIV Line;Telemetry   Respiratory Status room air   Existing Precautions/Restrictions seizure precautions;contact isolation;fall precautions;full code   Mutuality/Individual Preferences   Anxieties, Fears or Concerns Concerned about dizziness especially with activity and fear of falling   Individualized Care Needs Physical Therapy   Living Environment   Lives With spouse   Living Arrangements house  (single story)   Home Accessibility stairs to enter home   Home Main Entrance   Stair Railings, Main Entrance railings on both sides of stairs   Stairs Comment, Main Entrance She has a flat entry at the rear of the home she doesn;t usually use, but can if needed.   Number of Stairs, Main Entrance three   Functional Level Prior   Ambulation 0 - independent  (for last week before admission started using FWW due to dizziness and weakness)   Transferring 0 - independent   Toileting 0 - independent   Bathing 0 - independent   Dressing 0 - independent   Eating 0 - independent   Communication 0 - understands/communicates without difficulty   Prior Functional Level Comment Independent PLOF without AD until last week with illness. She drives and accessed community independently. Husband shares cooking and cleaning tasks.  Also has assistance as needed for granddaughter.   Pre Treatment Status   Pre Treatment Patient Status Patient  supine in bed   Support Present Pre Treatment  None   Communication Pre Treatment  Charge Nurse   Communication Pre Treatment Comment cleared for PT eval   Cognitive Assessment/Interventions   Behavior/Mood Observations behavior appropriate to situation, WNL/WFL;alert;cooperative   Orientation Status oriented x 4   Attention WNL/WFL   Follows Commands WNL   Pre- Treatment Vital Signs   Pre-Treatment Heart Rate (beats/min) 60   Pre SpO2 (%) 93   O2 Delivery Pre Treatment room air   Pre-Treatment Pain   Pretreatment Pain Rating 0/10 - no pain   RUE Assessment   RUE Assessment WFL- Within Functional Limits   LUE Assessment  LUE Assessment WFL- Within Functional Limits   RLE Assessment   RLE Assessment WFL- Within Functional Limits   LLE Assessment   LLE Assessment WFL- Within Functional Limits   Trunk Assessment   Trunk Assessment WFL-Within Functional Limits   Mobility Assessment/Training   Additional Documentation Bed Mobility Assessment/Treatment (Group);Transfer Assessment/Treatment (Group);Gait Assessment/Treatment (Group)   Bed Mobility Assessment/Treatment   Bed Mobility, Assistive Device bed rails   Comment Patient had to put forthe Mod/Min effort but completed without physical assistance. Patient had mild dizziness initially upon sitting up which lasted less than 30 seconds.   Scoot/Bridge Independence supervision required   Supine-Sit Independence stand-by assistance   Transfer Assessment/Treatment   Sit-Stand Independence stand-by assistance   Stand-Sit Independence stand-by assistance   Sit-Stand-Sit, Assist Device walker, front wheeled   Transfer Comment No dizziness upon standing. Returned to EOB following ambulation while chair was prepared, then completed bed to recliner chair transfer.   Bed-Chair-Bed Assist Device walker, front wheeled   Bed-Chair Independence stand-by assistance   Gait Assessment/Treatment   Total Distance Ambulated 200   Assistive Device  walker, front wheeled   Comment cues needed  to stay closer to walker and to take time while turning to avoid dizziness   Distance in Feet 200 feet   Independence  contact guard assist;stand-by assistance   Motor Skills/Interventions   Additional Documentation Balance Skills Training (Group)   Balance   Sitting, Dynamic (Balance) good balance   Sitting Balance: Static good balance   Standing Balance: Dynamic fair balance   Sit-to-Stand Balance fair + balance   Standing Balance: Static fair + balance   Post Treatment Status   Post Treatment Patient Status Patient sitting in bedside chair or w/c;Call light within reach;Patient safety alarm activated   Support Present Post Treatment  None   Publishing copy   Communication Post Treatment Comment updated on status at eval, nurse informed therapist physician requested PT complete Epley Maneuver for vertigo   Patient Effort good   Post-Treatment Vital Signs   Post-treatment Heart Rate (beats/min) 75   Post SpO2 (%) 98   O2 Delivery Post Treatment room air   Post-Treatment Pain   Posttreatment Pain Rating 0/10 - no pain   Physical Therapy Clinical Impression   Assessment Patient was admitted with TIA, diarrhea, vertigo, uncontrolled DM II and HTN. She continues to have dizziness with activity particularly supine to sitting motion. Did not have dizziness from sitting to standing or during ambulation or turns. Dr. Linde Gillis has completed Epley Maneuver for right ear involvement on 08/01/23 and has requested PT continue. This therapist will discuss with DOR and outpatient PT on how tomove forward with physician request. Patient will benefit  from continuedPT services for transfer, ambulation and activity tolerance training during hospital stay to aid a safe discharge to home. Patient discharge not yet determined. may be home with outpatient PT services or inpatient rehab stay.   Criteria for Skilled Therapeutic meets criteria   Impairments Found (describe specific impairments) aerobic  capacity/endurance;gait, locomotion, and balance;other (see comments)  (dizziness with supine to EOB transfer)   Functional Limitations in Following  self-care;home management;community/leisure   Rehab Potential good   Therapy Frequency other (see comments)  (1-2x/day at least one day weekly)   Predicted Duration of Therapy Intervention (days/wks) until discharge   Anticipated Equipment Needs at Discharge (PT) front wheeled walker  (patient already has available for use at home)   Anticipated Discharge Disposition TBD;home with outpatient services;inpatient rehabilitation facility  Evaluation Complexity Justification   Patient History: Co-morbidity/factors that impact Plan of Care 3 or more that impact Plan of Care   Examination Components 4 or more Exam elements addressed   Presentation Evolving: Symptoms, complaints, characteristics of condition changing &/or cognitive deficits present   Clinical Decision Making Low complexity   Evaluation Complexity Low complexity   Care Plan Goals   PT Rehab Goals Gait Training Goal;Physical Therapy Goal   Planned Therapy Interventions, PT Eval   Planned Therapy Interventions (PT) gait training;patient/family education;other (see comments)   Gait Training  Goal, Distance to Achieve   Gait Training  Goal, Date Established 08/03/23   Gait Training  Goal, Time to Achieve by discharge   Gait Training  Goal, Independence Level supervision required   Gait Training  Goal, Assist Device least restricted assistive device   Gait Training  Goal, Distance to Achieve 400 feet   Gait Training  Goal, Additional Goal without dizziness   Physical Therapy Goal   PT  Goal, Date Established 08/03/23   PT Goal, Time to Achieve by discharge   PT Goal, Activity Type Patient will be able to complete bed mobility, transfers and gait without dizziness.   PT Goal, Current Status other (see comments)  (dizziness with change in position from supine to EOB)   PT Goal, Independence Level supervision required    Physical Therapy Time and Intention   Total PT Minutes: 36               INTERVENTION MINUTES: EVALUATION 22 minutes and GAIT TRAINING 14 MINUTES    EVALUATION COMPLEXITY : CLINICAL DECISION MAKING OF LOW COMPLEXITY AS INDICATED BY PMH, PHYSICAL THERAPY ASSESSMENT OF MUSCULOSKELETAL AND NEUROLOGICAL SYSTEMS AND ACTIVITY LIMITATIONS. CLINICAL PRESENTATION IS STABLE AND UNCOMPLICATED    Therapist:     Rollene Rotunda, PT  08/03/2023, 13:45

## 2023-08-03 NOTE — OT Evaluation (Signed)
[facility]  [address]  [place], [address]  ([phone]  (Fax) [phone]  Rehabilitation Services  Occupational Therapy Inpatient Initial Evaluation      Patient Name: Grace Hoffman  Date of Birth: 12-23-1950  Height: Height: 165.1 cm (5\' 5" )  Weight: Weight: 87 kg (191 lb 11.2 oz)  Room/Bed: 326/A  Payor: MEDICARE / Plan: MEDICARE PART A AND B / Product Type: Medicare /         PMH:   Past Medical History:   Diagnosis Date    Anxiety     Chronic back pain     COVID-19 vaccine series completed     Depression     Diabetes mellitus, type 2 (CMS HCC)     Fibromyalgia affecting multiple sites     Hypertension     Hypomagnesemia     Insomnia disorder     Migraine     Mixed hyperlipidemia     Orthostatic hypotension     Osteoarthritis     TIA (transient ischemic attack)     Unspecified glaucoma(365.9)     Vitamin D deficiency            Assessment:      Functional Level at Time of Session: (P) Patient is a pleasant 72 year old female admitted for TIA. Prior to admission, she was independent in ADLs, IADLs and functional mobility. During OT evaluation, patient was alert, oriented x4, cooperative and able to follow multistep commands. She has full UB AROM and fair plus (3+/5) UB strength. She was able to engage in transfers, dressinf and toileting/perineal hygiene with SBA-min A due to weakness and decreased balance. Patient would benefit from post-acute rehab upon discharge. OT to follow up during acute stay for ADL training, conditioning and functional transfer training.    Discharge Needs:   Equipment Recommendation:  none anticipated    The patient presents with mobility limitations due to impaired range of motion and impaired strength that significantly impair/prevent patient's ability to participate in mobility-related activities of daily living (MRADLs) including  ambulation and transfers in order to safely complete, toileting, bathing, food preparation,  laundering/household tasks, safely entering/exiting the home, in reasonable time. This functional mobility deficit can be sufficiently resolved with the use of a Anticipated Equipment Needs at Discharge: (P) none anticipated  in order to decrease the risk of falls, morbidity, and mortality in performance of these MRADLs.  Patient is able to safely use this assistive device.    Discharge Disposition:  inpatient rehabilitation facility    JUSTIFICATION OF DISCHARGE RECOMMENDATION   Based on current diagnosis, functional performance prior to admission, and current functional performance, this patient requires continued OT services in Anticipated Discharge Disposition: (P) inpatient rehabilitation facility  in order to achieve significant functional improvements.    Plan:   Current Intervention:  Predicted Duration of Therapy: (P) until discharge    To provide Occupational therapy services  Therapy Frequency: (P) minimum of 1x/week for duration of Predicted Duration of Therapy: (P) until discharge  .       The risks/benefits of therapy have been discussed with the patient/caregiver and he/she is in agreement with the established plan of care.       Subjective & Objective     MEDICAL HISTORY:   Past Medical History:   Diagnosis Date    Anxiety     Chronic back pain     COVID-19 vaccine series completed     Depression  Diabetes mellitus, type 2 (CMS HCC)     Fibromyalgia affecting multiple sites     Hypertension     Hypomagnesemia     Insomnia disorder     Migraine     Mixed hyperlipidemia     Orthostatic hypotension     Osteoarthritis     TIA (transient ischemic attack)     Unspecified glaucoma(365.9)     Vitamin D deficiency          SURGICAL HISTORY:   Past Surgical History:   Procedure Laterality Date    HX CHOLECYSTECTOMY      HX KNEE REPLACMENT Left     HX ROTATOR CUFF REPAIR Left     SKIN CANCER EXCISION      forehead removed       ipp    INSERT FLOW SHEET     08/03/23 0911   Rehab Session   Document Type  evaluation   OT Visit Date 08/03/23   Total OT Minutes: 15   General Information   Patient Profile Reviewed yes   Medical Lines PIV Line;Telemetry   Respiratory Status room air   Existing Precautions/Restrictions fall precautions;seizure precautions   Pre Treatment Status   Pre Treatment Patient Status Patient sitting on edge of bed;Call light within reach;Telephone within reach   Support Present Pre Treatment  Nurse present   Communication Pre Treatment  Nurse   Communication Pre Treatment Comment Cleared for OT   Living Environment   Lives With spouse   Living Arrangements house   Home Accessibility tub/shower is not walk in   Living Environment Comment She has a tub and BSC   Functional Level Prior   Ambulation 1 - assistive equipment  (Started using a FWW last week due to dizziness)   Transferring 0 - independent   Toileting 0 - independent   Bathing 0 - independent   Dressing 0 - independent   Eating 0 - independent   Communication 0 - understands/communicates without difficulty   Swallowing 0-->swallows foods/liquids without difficulty   Self-Care   Dominant Hand right   Vital Signs   Pre-Treatment Heart Rate (beats/min) 81   Post-treatment Heart Rate (beats/min) 82   Pre SpO2 (%) 96   O2 Delivery Pre Treatment room air   Post SpO2 (%) 96   O2 Delivery Post Treatment room air   Coping/Psychosocial   Observed Emotional State calm;cooperative   Verbalized Emotional State acceptance   Family/Support System   Family/Support Persons spouse   Involvement in Care not present at bedside   Coping/Psychosocial Response Interventions   Plan Of Care Reviewed With patient   Cognitive Assessment/Interventions   Behavior/Mood Observations behavior appropriate to situation, WNL/WFL   Orientation Status oriented x 4   Attention WNL/WFL   Follows Commands WFL   Vision Assessment/Interventions   Visual Impairment/Limitations WFL   RUE Assessment   RUE Assessment WFL- Within Functional Limits   LUE Assessment   LUE Assessment  WFL- Within Functional Limits   Grip Strength   Grip Left (3/5) fair, left   Right Grip (3/5) fair, right   Bed Mobility Assessment/Treatment   Sit to Supine, Independence stand-by assistance   Transfer Assessment/Treatment   Sit-Stand Independence minimum assist (75% patient effort)   Stand-Sit Independence minimum assist (75% patient effort)   Sit-Stand-Sit, Assist Device walker, front wheeled   Toilet Transfer Independence contact guard assist   Toilet Transfer Assist Device walker, front wheeled   Transfer Impairments balance impaired;ROM decreased;strength decreased  Lower Body Dressing Assessment/Training   Position sitting   DRESSING ASSESSED Don Socks;Doff Socks   Independence Level  standby assist   Toileting Assessment/Training   Position sitting   TOILETING ASSESSED Adjust clothing prior;Adjust clothing after;Perineal hygiene   ASSISTANCE REQUIRED  Adjust clothing prior   Independence Level  standby assist   Impairments balance impaired   Post Treatment Status   Post Treatment Patient Status Patient supine in bed;Call light within reach;Telephone within reach   Support Present Post Treatment  None   Care Plan Goals   OT Rehab Goals Bathing Goal;Grooming Goal;LB Dressing Goal;UB Dressing Goal;Toileting Goal   Bathing Goal   Bathing Goal, Date Established 08/03/23   Bathing Goal, Time to Achieve by discharge   Bathing Goal, Activity Type all bathing tasks   Bathing Goal, Independence Level independent   Grooming Goal   Grooming Goal, Date Established 08/03/23   Grooming Goal, Time to Achieve by discharge   Grooming Goal, Activity Type all grooming tasks   Grooming Goal, Independence  independent   LB Dressing Goal   LB Dressing Goal, Date Established 08/03/23   LB Dressing Goal, Time to Achieve by discharge   LB Dressing Goal, Activity Type all lower body dressing tasks   LB Dressing Goal, Independence Level independent   Toileting Goal   Toileting Goal, Date Established 08/03/23   Toileting Goal, Time to  Achieve by discharge   Toileting Goal, Activity Type all toileting tasks   Toileting Goal, Independence Level independent   UB Dressing Goal   UB Dressing  Goal, Date Established 08/03/23   UB Dressing Goal, Time to Achieve by discharge   UB Dressing Goal, Activity Type all upper body dressing tasks   UB Dressing Goal, Independence Level independent   Planned Therapy Interventions, OT Eval   Planned Therapy Interventions ADL retraining   Clinical Impression   Functional Level at Time of Session Patient is a pleasant 72 year old female admitted for TIA. Prior to admission, she was independent in ADLs, IADLs and functional mobility. During OT evaluation, patient was alert, oriented x4, cooperative and able to follow multistep commands. She has full UB AROM and fair plus (3+/5) UB strength. She was able to engage in transfers, dressinf and toileting/perineal hygiene with SBA-min A due to weakness and decreased balance. Patient would benefit from post-acute rehab upon discharge. OT to follow up during acute stay for ADL training, conditioning and functional transfer training.   Criteria for Skilled Therapeutic Interventions Met (OT) yes;meets criteria;skilled treatment is necessary   Rehab Potential good   Therapy Frequency minimum of 1x/week   Predicted Duration of Therapy until discharge   Anticipated Equipment Needs at Discharge none anticipated   Anticipated Discharge Disposition inpatient rehabilitation facility   Evaluation Complexity Justification   Occupational Profile Review Brief history   Performance Deficits 1-3 deficits   Clinical Decision Making Low analytic complexity   Evaluation Complexity Low       TREATMENT PLAN: THERAPEUTIC ACTIVITIES and ADL/IADL TRAINING  EVALUATION COMPLEXITY: CLINICAL DECISION MAKING OF LOW COMPLEXITY AS INDICATED BY PMH, OCCUPATIONAL THERAPY ASSESSMENT OF MUSCULOSKELETAL AND NEUROLOGICAL SYSTEMS AND ACTIVITY LIMITATIONS. CLINICAL PRESENTATION IS STABLE AND  UNCOMPLICATED.      EVALUATION 15 minutes    Therapist:      Hildred Laser, OT,08/03/2023 10:39

## 2023-08-03 NOTE — OT Treatment (Signed)
[facility]  [address]  [place], [address]  [phone]  (Fax) [phone]  Rehabilitation Department     Occupational Therapy Daily Inpatient Note    Date: 08/03/2023  Patient's Name: Grace Hoffman  Date of Birth: 03-24-1951  Height: Height: 165.1 cm (5\' 5" )  Weight: Weight: 87 kg (191 lb 11.2 oz)        Plan: Will continue under current POC.         Subjective/Objective/Assessment:      08/03/23 1340   Rehab Session   Document Type therapy progress note (daily note)   OT Visit Date 08/03/23   Total OT Minutes: 10   Patient Effort good   General Information   Patient Profile Reviewed yes   Medical Lines PIV Line;Telemetry   Respiratory Status room air   Existing Precautions/Restrictions fall precautions;seizure precautions   Pre Treatment Status   Pre Treatment Patient Status Patient supine in bed;Call light within reach;Telephone within reach;Patient safety alarm activated;Nurse approved session   Support Present Pre Treatment  None   Communication Pre Treatment  Charge Nurse   Communication Pre Treatment Comment cleared for OT   Vital Signs   Pre-Treatment Heart Rate (beats/min) 66   Post-treatment Heart Rate (beats/min) 76   Pre SpO2 (%) 97   O2 Delivery Pre Treatment room air   Post SpO2 (%) 96   O2 Delivery Post Treatment room air   Cognitive Assessment/Interventions   Behavior/Mood Observations behavior appropriate to situation, WNL/WFL   Orientation Status oriented x 4   Attention WNL/WFL   Follows Commands WFL   Bed Mobility Assessment/Treatment   Bed Mobility, Assistive Device bed rails   Supine-Sit Independence stand-by assistance   Sit to Supine, Independence stand-by assistance   Therapeutic Exercise/Activity   Comment UB exercises with theraband   Post Treatment Status   Post Treatment Patient Status Patient supine in bed;Call light within reach;Telephone within reach;Patient safety alarm activated   Support Present Post Treatment  None   Clinical  Impression   Functional Level at Time of Session Patient supine in bed. Patient able to get from supine to EOB with SBA. Patient completed UB strengthening exercises with red theraband. Patient completed chest pulls, shoulder flexion/extension, shoulder diagonals, elbow flexion/extension 15 reps X 2 sets. Patient returned to supine with SBA. Patient left with needs within reach and bed alarm activated.                 Intervention minutes: THERAPEUTIC ACTIVITY 10 minutes      THERAPIST  Coletta Memos, COTA  08/03/2023, 14:57

## 2023-08-04 ENCOUNTER — Telehealth (INDEPENDENT_AMBULATORY_CARE_PROVIDER_SITE_OTHER): Payer: Self-pay | Admitting: Physician Assistant

## 2023-08-04 DIAGNOSIS — Z8669 Personal history of other diseases of the nervous system and sense organs: Secondary | ICD-10-CM

## 2023-08-04 DIAGNOSIS — E119 Type 2 diabetes mellitus without complications: Secondary | ICD-10-CM

## 2023-08-04 LAB — BASIC METABOLIC PANEL
ANION GAP: 7 mmol/L (ref 4–13)
BUN/CREA RATIO: 29 — ABNORMAL HIGH (ref 6–22)
BUN: 27 mg/dL — ABNORMAL HIGH (ref 7–25)
CALCIUM: 9.4 mg/dL (ref 8.6–10.3)
CHLORIDE: 106 mmol/L (ref 98–107)
CO2 TOTAL: 26 mmol/L (ref 21–31)
CREATININE: 0.92 mg/dL (ref 0.60–1.30)
ESTIMATED GFR: 65 mL/min/{1.73_m2} (ref >59)
GLUCOSE: 176 mg/dL — ABNORMAL HIGH (ref 74–109)
OSMOLALITY, CALCULATED: 287 mOsm/kg (ref 270–290)
POTASSIUM: 4.5 mmol/L (ref 3.5–5.1)
SODIUM: 139 mmol/L (ref 136–145)

## 2023-08-04 LAB — CBC WITH DIFF
BASOPHIL #: 0.1 10*3/uL (ref 0.00–0.10)
BASOPHIL %: 1 % (ref 0–1)
EOSINOPHIL #: 0.4 10*3/uL (ref 0.00–0.50)
EOSINOPHIL %: 5 % (ref 1–7)
HCT: 43.9 % — ABNORMAL HIGH (ref 31.2–41.9)
HGB: 14.8 g/dL — ABNORMAL HIGH (ref 10.9–14.3)
LYMPHOCYTE #: 2.4 10*3/uL (ref 1.00–3.00)
LYMPHOCYTE %: 35 % (ref 16–44)
MCH: 32.8 pg (ref 24.7–32.8)
MCHC: 33.6 g/dL (ref 32.3–35.6)
MCV: 97.8 fL — ABNORMAL HIGH (ref 75.5–95.3)
MONOCYTE #: 0.5 10*3/uL (ref 0.30–1.00)
MONOCYTE %: 8 % (ref 5–13)
MPV: 10.2 fL (ref 7.9–10.8)
NEUTROPHIL #: 3.4 10*3/uL (ref 1.85–7.80)
NEUTROPHIL %: 51 % (ref 43–77)
PLATELETS: 166 10*3/uL (ref 140–440)
RBC: 4.49 10*6/uL (ref 3.63–4.92)
RDW: 14.6 % (ref 12.3–17.7)
WBC: 6.7 10*3/uL (ref 3.8–11.8)

## 2023-08-04 LAB — POC BLOOD GLUCOSE (RESULTS)
GLUCOSE, POC: 172 mg/dl — ABNORMAL HIGH (ref 70–100)
GLUCOSE, POC: 190 mg/dl — ABNORMAL HIGH (ref 70–100)
GLUCOSE, POC: 231 mg/dl — ABNORMAL HIGH (ref 70–100)
GLUCOSE, POC: 217 mg/dl — ABNORMAL HIGH (ref 70–100)

## 2023-08-04 LAB — MAGNESIUM: MAGNESIUM: 2 mg/dL (ref 1.9–2.7)

## 2023-08-04 LAB — HERPES SIMPLEX VIRUS (HSV) TYPE 1- AND TYPE 2-SPECIFIC ABS, IGG
HSV-1 IGG QUALITATIVE: POSITIVE — AB
HSV2 IGG QUALITATIVE: NEGATIVE

## 2023-08-04 NOTE — Care Management Notes (Signed)
[facility]  Care Management Note    Patient Name: Grace Hoffman  Date of Birth: November 06, 1951  Sex: female  Date/Time of Admission: 07/29/2023 12:21 PM  Room/Bed: 326/A  Payor: MEDICARE / Plan: MEDICARE PART A AND B / Product Type: Medicare /    LOS: 6 days   Primary Care Providers:  Alvis, Amy, PA-C, PA-C (General)    Admitting Diagnosis:  TIA (transient ischemic attack) [G45.9]    Assessment:       Discharge Plan:  Acute Rehab Placement/Return (not psych) (code 62)  PATIENT AGREED TO REFERRAL TO [facility] AS LONG AS MEDICARE WILL COVER THE LENGTH OF STAY AND THAT She CAN LEAVE AND GO HOME IF She BELIEVES THAT She ISN'T BEING TAKEN CARE OF PROPERLY. CM SENT REFERRAL VIA [facility] AND LEFT A VM FOR [facility] WITH THE PATIENT'S CONCERNS. CM NOTIFIED HOSPITALIST THAT THE PATIENT IS WILLING TO D/C TO [facility].     The patient will continue to be evaluated for developing discharge needs.     Case Manager: Luiz Ochoa, SOCIAL WORKER  Phone: [phone]

## 2023-08-04 NOTE — Discharge Summary (Signed)
[place] [facility]     DISCHARGE SUMMARY      PATIENT NAME:  Grace Hoffman   MRN:  [id number]  DOB:  Feb 07, 1951    INPATIENT ADMISSION DATE: 07/29/2023   DATE OF DISCHARGE:  08/06/23     ATTENDING PHYSICIAN: Omelia Blackwater, MD     PRIMARY CARE PHYSICIAN: Eyvonne Mechanic, PA-C     HOSPITAL PRESENTATION:    Please see full admission H&P for details.      As per HPI:  72 year old female with multiple medical problems including diabetes mellitus hypertension hyperlipidemia prior TIA coming in with a 3 day history of unsteady gait.  Patient reports that this was followed by generalized weakness and loose bowel movements associated with nausea but no abdominal pain or vomiting. She reports having developed unsteady gait about 3 days ago on this morning she noted that her blood sugars low requiring orange juice to help improve her blood sugar. She denies any obvious focal weakness.  No chest pain or shortness on breath reported.     Patient reports that she was previously been told that she had a TIA as per her neurologist.     On arrival in the ED initial workup with CT head was unremarkable.  She was noted to have leukocytosis 14.6, creatinine 1.0 and tele Neurology was consulted for further recommendations.  In view of her symptoms hospitalist service called in for admission for further evaluation.       FURTHER HOSPITAL COURSE WITH DISCHARGE DIAGNOSES:    72 year old female with past medical history of TIA hypertension hyperlipidemia diabetes mellitus coming in with unsteady gait that was preceded with nausea vomiting and diarrhea.  Symptoms suggestive of probable CVA in view of her risk factors but rule out vasovagal and all medications as possible etiologies.  For dizziness and skin rash.  Workup including MRI, MRA head and neck and CT head were unremarkable.  Further workup including vitamin B12, folate syphilis and HIV were unremarkable.      Patient seen by tele cardiology for dizziness with  bradycardia which they concluded is in to orthostatic hypotension with bradycardia likely vagal response.  No permanent pacemaker indication at this time but patient follow up with Cardiology as outpatient.  2D echo showed EF 50-55%.  Bubble study was unremarkable and with no significant valvular disease.        Patient had PT evaluation who recommended inpatient rehab.  While at inpatient rehab patient will benefit from Epley maneuver for her dizziness which is likely peripheral.  She showed some improvement in her symptoms with Epley maneuver performed during hospital stay.  Patient will benefit from further evaluation by Neurology Service as outpatient.  She will also benefit from dermatology evaluation for her skin lesions.  Discussed with patient follow up with the primary care provider for referral to dermatologist.  Patient verbalized understanding discharge and follow-up instructions.  All questions answered.      Problem List:  Active Hospital Problems   (*Primary Problem)    Diagnosis    *TIA (transient ischemic attack)    Asymptomatic bradycardia    Diarrhea    Vertigo    Uncontrolled type 2 diabetes mellitus with hyperglycemia (CMS HCC)    Hypertension       PHYSICAL EXAM   DAY OF DISCHARGE:    BP 137/72   Pulse 70   Temp 36.4 C (97.6 F)   Resp 13   Ht 1.651 m (5\' 5" )   Wt  86.5 kg (190 lb 12.8 oz)   SpO2 99%   BMI 31.75 kg/m          General:  Patient is resting in bed, no acute distress, alert and oriented x3   Eyes:  PERRL, no scleral icterus   HENT:  Normocephalic, atraumatic, oral mucosa is moist and pink, no nasal discharge   Heart:  RRR, S1 and S2 auscultated, no murmurs appreciated   Lungs:  Unlabored respirations.  Lungs are clear to auscultation bilaterally, no wheezes, no rales  Abdomen:  Soft, active bowel sounds, non-tender to palpation, non-distended  Extremities:  Pulses equal in all extremities bilaterally.  Capillary refill less than 3 seconds.  No edema in lower extremities  bilaterally   Skin:  Warm and dry.  Three areas of Erythematous macular lesions under left breast.  Not diaphoretic  Neuro:  A&O x 3.  No focal deficits.  Speech intact.  Not tremulous  Psych:  Cooperative, not agitated    LABS:  CBC with Diff (Last 48 Hours):    Recent Results in last 48 hours     08/06/23  0304   WBC 6.7   HGB 15.3*   HCT 46.1*   MCV 98.6*   PLTCNT 185   PMNS 53   LYMPHOCYTES 33   MONOCYTES 9   EOSINOPHIL 4   BASOPHILS 1  0.10          Last BMP  (Last result in the past 48 hours)        Na   K   Cl   CO2   BUN   Cr   Calcium   Glucose   Glucose-Fasting        08/06/23 0304 138   4.7   106   24   24   0.93   9.4   199                  BMP (Last 48 Hours):    Recent Results in last 48 hours     08/06/23  0304   SODIUM 138   POTASSIUM 4.7   CHLORIDE 106   CO2 24   BUN 24   CREATININE 0.93   CALCIUM 9.4   GLUCOSENF 199*          DISCHARGE MEDICATIONS:     Current Discharge Medication List        START taking these medications.        Details   meclizine 25 mg Tablet  Commonly known as: ANTIVERT   25 mg, Oral, EVERY 8 HOURS (SCHEDULED)  Qty: 42 Tablet  Refills: 0     nystatin-triamcinolone 100,000-0.1 unit/g-% Cream  Commonly known as: MYCOLOG II   Topical, 4 TIMES DAILY, Apply to affected skin  Qty: 1 Each  Refills: 0            CONTINUE these medications which have CHANGED during your visit.        Details   HYDROcodone-acetaminophen 7.5-325 mg Tablet  Commonly known as: NORCO  What changed: when to take this   1 Tablet, Oral, EVERY 8 HOURS PRN  Qty: 6 Tablet  Refills: 0     insulin lispro 100 unit/mL Insulin Pen  Commonly known as: HumaLOG KwikPen Insulin  What changed: additional instructions   Use as directed sliding scale; also use 5 units with meals  Qty: 3 mL  Refills: 2            CONTINUE these  medications - NO CHANGES were made during your visit.        Details   d-mannose 500 mg Capsule   1 Capsule, Oral, 2 TIMES DAILY  Refills: 0     diclofenac sodium 20 mg/gram /actuation(2 %) solution  in metered-dose pump  Commonly known as: Pennsaid   1 Application , Apply externally, 2 TIMES DAILY  Qty: 112 g  Refills: 3     empagliflozin 25 mg Tablet  Commonly known as: JARDIANCE   25 mg, Oral, DAILY  Qty: 90 Tablet  Refills: 1     enalapril maleate 10 mg Tablet  Commonly known as: VASOTEC   10 mg, Oral, DAILY  Qty: 90 Tablet  Refills: 1     ergocalciferol (vitamin D2) 1,250 mcg (50,000 unit) Capsule  Commonly known as: DRISDOL   50,000 Units, Oral, EVERY 7 DAYS  Qty: 12 Capsule  Refills: 1     glimepiride 4 mg Tablet  Commonly known as: AMARYL   TAKE 1 TABLET(4 MG) BY MOUTH TWICE DAILY WITH A MEAL FOR DIABETES  Qty: 180 Tablet  Refills: 1     insulin glargine U-300 conc 300 unit/mL (3 mL) Insulin Pen  Commonly known as: Toujeo Max U-300 SoloStar   30 Units, Subcutaneous, NIGHTLY  Qty: 6 mL  Refills: 2     latanoprost 0.005 % Drops  Commonly known as: XALATAN   1 Drop, Left Eye, NIGHTLY  Refills: 0     Levetiracetam 500 mg Tablet Sustained Release 24 hr   500 mg, Oral, NIGHTLY  Qty: 90 Tablet  Refills: 1     loratadine 10 mg Tablet  Commonly known as: CLARITIN   10 mg, Oral, DAILY PRN  Qty: 90 Tablet  Refills: 3     magnesium oxide 400 mg Tablet  Commonly known as: MAG-OX   800 mg, Oral, DAILY  Qty: 90 Tablet  Refills: 1     MetFORMIN 1,000 mg Tablet  Commonly known as: GLUCOPHAGE   1,000 mg, Oral, 2 TIMES DAILY WITH FOOD  Qty: 180 Tablet  Refills: 1     rosuvastatin 20 mg Tablet  Commonly known as: CRESTOR   20 mg, Oral, EVERY EVENING  Qty: 90 Tablet  Refills: 4     solifenacin 5 mg Tablet  Commonly known as: VESICARE   5 mg, Oral, DAILY  Qty: 90 Tablet  Refills: 1     SUMAtriptan 50 mg Tablet  Commonly known as: IMITREX   50 mg, Oral, ONCE PRN, TAKE 1 TABLET BY MOUTH AT ONSET OF HEADACHE. IF NO RELIEF MAY REPEAT 1 AFTER AT LEAST 2 HOURS. MAX OF 4 TABS PER 24 HOURS  Qty: 8 Tablet  Refills: 2     venlafaxine 37.5 mg Capsule, Sust. Release 24 hr  Commonly known as: EFFEXOR XR   37.5 mg, Oral, NIGHTLY  Qty: 90  Capsule  Refills: 1            STOP taking these medications.      diazePAM 5 mg Tablet  Commonly known as: VALIUM                 DISCHARGE DISPOSITION:  Inpatient rehab    DISCHARGE INSTRUCTIONS:    Follow-up Information       Alvis, Amy, PA-C Follow up.    Specialty: PHYSICIAN ASSISTANT  Why: FOLLOW-UP: WEDNESDAY AUGUST 14TH, 2024 @ 1:00 PM WITH AMY ALVIS  Contact information:  [address]  [place] [address]  [phone]               Lonzo Cloud, MD Follow up.    Specialty: CARDIOVASCULAR DISEASE  Why: FOLLOW-UP: [facility] WILL CALL WITH APPOINTMENT  Contact information:  [address]  [place] [address]  [phone]               Dyke Brackett, DO Follow up.    Specialty: NEUROLOGY  Why: Dr. Renea Ee office will call when appointment is made.  Contact information:  [address]  [address]  [place] [address]  [phone]                              DISCHARGE INSTRUCTION - DIABETIC DIET     Diet: DIABETIC DIET      DISCHARGE INSTRUCTION - ACTIVITY     Activity: AS TOLERATED       Follow-up Information       Alvis, Amy, PA-C Follow up.    Specialty: PHYSICIAN ASSISTANT  Why: FOLLOW-UP: WEDNESDAY AUGUST 14TH, 2024 @ 1:00 PM WITH AMY ALVIS  Contact information:  [address]  [place] [address]  [phone]               Lonzo Cloud, MD Follow up.    Specialty: CARDIOVASCULAR DISEASE  Why: FOLLOW-UP: [facility] WILL CALL WITH APPOINTMENT  Contact information:  [address]  [place] [address]  [phone]               Dyke Brackett, DO Follow up.    Specialty: NEUROLOGY  Why: FOLLOW-UP: THURSDAY AUGUST 15TH, 2024 @ 2:20 PM WITH STEPHAN COX  Contact information:  [address]  [place] [address]  [phone]                                    Copies sent to Care Team         Relationship Specialty Notifications Start End    Eyvonne Mechanic, [place] PCP - General PHYSICIAN ASSISTANT Abnormal results only, Admissions 10/29/22     Phone: [phone] Fax:  [phone]         [address] [place] [address]          Discharge and follow-up plan discussed with patient and family who verbalized understanding and agreed.  Time spent, 41 minutes.      Omelia Blackwater, MD  [facility] MEDICINE HOSPITALIST

## 2023-08-04 NOTE — OT Treatment (Signed)
[facility]  [address]  [place], [address]  [phone]  (Fax) [phone]  Rehabilitation Department     Occupational Therapy Daily Inpatient Note    Date: 08/04/2023  Patient's Name: Grace Hoffman  Date of Birth: 04/09/1951  Height: Height: 165.1 cm (5\' 5" )  Weight: Weight: 85.5 kg (188 lb 9.6 oz)        Plan: Will continue under current POC.         Subjective/Objective/Assessment:      08/04/23 1005   Rehab Session   Document Type therapy progress note (daily note)   OT Visit Date 08/04/23   Total OT Minutes: 25   Patient Effort good   General Information   Patient Profile Reviewed yes   Medical Lines Telemetry;PIV Line   Respiratory Status room air   Existing Precautions/Restrictions fall precautions;seizure precautions   Pre Treatment Status   Pre Treatment Patient Status Patient supine in bed;Call light within reach;Telephone within reach;Patient safety alarm activated;Nurse approved session   Support Present Pre Treatment  None   Communication Pre Treatment  Charge Nurse   Communication Pre Treatment Comment Cleared for OT   Vital Signs   Pre-Treatment Heart Rate (beats/min) 72   Post-treatment Heart Rate (beats/min) 78   Pre SpO2 (%) 97   O2 Delivery Pre Treatment room air   Post SpO2 (%) 98   O2 Delivery Post Treatment room air   Coping/Psychosocial   Observed Emotional State calm;cooperative   Verbalized Emotional State acceptance   Cognitive Assessment/Interventions   Behavior/Mood Observations behavior appropriate to situation, WNL/WFL   Orientation Status oriented x 4   Attention WNL/WFL   Follows Commands follows one step commands   Bed Mobility Assessment/Treatment   Bed Mobility, Assistive Device bed rails   Supine-Sit Independence stand-by assistance   Sit to Supine, Independence stand-by assistance   Bathing Assessment/Training   Position sitting   BATHING ASSESSED Entire body   ASSISTANCE REQUIRED Arm right;Arm left;Chest;Abdomen;Perineal  area;Buttocks;Upper leg right;Upper leg left;Lower leg/foot right;Lower leg/foot left   Independence Level standby assist   Upper Body Dressing Assessment/Training   Position  sitting   DRESSING ASSESSED Don Shirt-pull over;Doff Shirt- pull over   ASSISTANCE REQUIRED DONNING Arm right;Arm left;Pull over head;Pull over trunk;Pull around shoulders   ASSISTANCE REQUIRED DOFFING Arm right;Arm left;Reomve over head;Remove from trunk;Remove around shoulders   Independence Level  supervision required   Lower Body Dressing Assessment/Training   Position sitting   DRESSING ASSESSED Deanne Coffer Socks;Doff Underwear;Doff Socks   ASSISTANCE REQUIRED DONNING Underwear right leg;Underwear left leg;Underwear pull up;Right sock;Left sock   ASSITANCE REQUIRED DOFFING  Underwear right leg;Underwear left leg;Underwear pull down;Right sock;Left sock   Independence Level  standby assist   Grooming/Oral Hygeine  Assessment/Training   Position standing   Independence Level standby assist   Impairments activity tolerance impaired   Comment oral care and brushing hair   Post Treatment Status   Post Treatment Patient Status Patient supine in bed;Call light within reach;Telephone within reach;Patient safety alarm activated   Support Present Post Treatment  None   Clinical Impression   Functional Level at Time of Session Patient supine in bed. Patient able to get from supine to sit EOB with SBA. Patient able to don/doff gown with SPV. Patient completed bathing task with SBA with FWW and gaitbelt for LB bathing and perineal care. Patient able to don/doff socks and underwear with SBA for patient safety. Patient stood at sink with FWW and gaitbelt  with SBA for patient safety. Patient able to complete grooming task of oral care and hair brushing. Patient returned to EOB and returned to supine. Patient left with needs within reach and bed alarm activated.                 Intervention minutes: ADL/IADL TRAINING 25 minutes      THERAPIST  Coletta Memos, COTA  08/04/2023, 13:18

## 2023-08-04 NOTE — Care Plan (Signed)
Problem: Adult Inpatient Plan of Care  Goal: Plan of Care Review  Outcome: Ongoing (see interventions/notes)  Goal: Patient-Specific Goal (Individualized)  Outcome: Ongoing (see interventions/notes)  Flowsheets (Taken 08/03/2023 1941)  Individualized Care Needs: pt, monitor  Anxieties, Fears or Concerns: concerned about dizziness  Patient-Specific Goals (Include Timeframe): dc when ready  Plan of Care Reviewed With: patient  Goal: Absence of Hospital-Acquired Illness or Injury  Outcome: Ongoing (see interventions/notes)  Intervention: Identify and Manage Fall Risk  Recent Flowsheet Documentation  Taken 08/03/2023 1941 by Domingo Madeira, RN  Safety Promotion/Fall Prevention:   activity supervised   fall prevention program maintained   motion sensor pad activated   nonskid shoes/slippers when out of bed   muscle strengthening facilitated   safety round/check completed  Goal: Optimal Comfort and Wellbeing  Outcome: Ongoing (see interventions/notes)  Goal: Rounds/Family Conference  Outcome: Ongoing (see interventions/notes)     Problem: Health Knowledge, Opportunity to Enhance (Adult,Obstetrics,Pediatric)  Goal: Knowledgeable about Health Subject/Topic  Description: Patient will demonstrate the desired outcomes by discharge/transition of care.  Outcome: Ongoing (see interventions/notes)     Problem: Fluid Volume Excess  Goal: Fluid Balance  Outcome: Ongoing (see interventions/notes)     Problem: Fall Injury Risk  Goal: Absence of Fall and Fall-Related Injury  Outcome: Ongoing (see interventions/notes)  Intervention: Promote Injury-Free Environment  Recent Flowsheet Documentation  Taken 08/03/2023 1941 by Domingo Madeira, RN  Safety Promotion/Fall Prevention:   activity supervised   fall prevention program maintained   motion sensor pad activated   nonskid shoes/slippers when out of bed   muscle strengthening facilitated   safety round/check completed

## 2023-08-04 NOTE — Nurses Notes (Signed)
[facility]  [address]  [place], [address]  [phone]  (Fax) [phone]  Rehabilitation Department  Physical Therapy    Restorative Aide Note    Date: 08/04/2023  Patient's Name: Grace Hoffman  Date of Birth: 06/17/1951  Height: Height: 165.1 cm (5\' 5" )  Weight: Weight: 85.5 kg (188 lb 9.6 oz)      Total minutes: 15    Patient effort: good    Patient profile reviewed: yes    Medical Lines: TELEMETRY    Respiratory Status:ROOM AIR    Existing precautions/restrictions:FALL PRECAUTIONS    Pre-treatment status: PATIENT SUPINE IN BED    Communication pre-treatment: CHARGE NURSE    Attention/Behavior: WNL/WFL    Pre-treatment HR: 70    Post-treatment HR: 75    PreSpO2: 91    O2 delivery pre-treatment: n/a    Post SpO2: 95    Pre-treatment pain rating: 0/10    Post-treatment pain rating: 0/10    Location of pain: n/a    Bed mobility (roll, bridge, scoot, supine to sit): self to  roll and scoot    Sitting assistance:  SBA/CGA    Sit to stand: SBA/CGA    Gait: ambulated patient 200 feet with wheeled walker, gait belt and one assit/declined up to a chair    Post treatment status: IN HOSPITAL BED, NEEDS IN REACH, BED ALARMED          Annamaria Helling, PAT CARE [facility]  08/04/2023, 11:55

## 2023-08-04 NOTE — Care Plan (Signed)
Problem: Adult Inpatient Plan of Care  Goal: Plan of Care Review  Outcome: Ongoing (see interventions/notes)  Goal: Patient-Specific Goal (Individualized)  Outcome: Ongoing (see interventions/notes)  Goal: Absence of Hospital-Acquired Illness or Injury  Outcome: Ongoing (see interventions/notes)  Goal: Optimal Comfort and Wellbeing  Outcome: Ongoing (see interventions/notes)  Goal: Rounds/Family Conference  Outcome: Ongoing (see interventions/notes)     Problem: Health Knowledge, Opportunity to Enhance (Adult,Obstetrics,Pediatric)  Goal: Knowledgeable about Health Subject/Topic  Description: Patient will demonstrate the desired outcomes by discharge/transition of care.  Outcome: Ongoing (see interventions/notes)     Problem: Fluid Volume Excess  Goal: Fluid Balance  Outcome: Ongoing (see interventions/notes)     Problem: Fall Injury Risk  Goal: Absence of Fall and Fall-Related Injury  Outcome: Ongoing (see interventions/notes)

## 2023-08-04 NOTE — Progress Notes (Signed)
[place] MEDICINE [facility]    HOSPITALIST PROGRESS NOTE    [name]  Date of service: 08/04/2023  Date of Admission:  07/29/2023  Hospital Day:  LOS: 6 days     Subjective:   Patient reports improvement in her dizziness.  She continues of patches of erythema under her left breast.  No fever chills rigors or any other new complaints reported.       States that is follows with a neurologist who placed on Keppra.  She was also noted to take valium.      Vital Signs:  Filed Vitals:    08/03/23 2348 08/04/23 0400 08/04/23 0723 08/04/23 1117   BP: (!) 109/56  106/73 116/63   Pulse: 66  66 66   Resp: 18  18 20    Temp: 36.2 C (97.2 F)  36.2 C (97.1 F) 36.2 C (97.1 F)   SpO2: 95% 96% 97% 96%        Physical Exam:  General:  Patient in NAD, resting in bed, no visitors present  Head:  Normocephalic, atraumatic  Eyes:  PERRL, anicteric sclera  ENT:  Oral mucosa moist, no nasal discharge   Neck:  Soft, supple, trachea midline  Heart:  RRR, S1 and S2 normal  Lungs:  Unlabored respirations.  Lungs are clear to auscultation bilaterally, with no wheezes, no rales, no conversational dyspnea  Abdomen:  Soft, active bowel sounds, non-tender to palpation, non-distended  Extremities:  Pulses equal bilaterally.  Capillary refill less than 3 seconds.  No edema in lower extremities bilaterally   Skin:  Warm and dry, not diaphoretic.  No ecchymosis noted.  Erythema noted left under breast.  Neuro:  A&O x 3.  No focal deficits.  Speech intact  Psych:  Cooperative, not agitated    Intake & Output:    Intake/Output Summary (Last 24 hours) at 08/04/2023 1210  Last data filed at 08/04/2023 1030  Gross per 24 hour   Intake 1200 ml   Output --   Net 1200 ml     I/O current shift:  08/13 0700 - 08/13 1859  In: 240 [P.O.:240]  Out: -   Emesis:    BM:    Date of Last Bowel Movement: 07/30/23  Heme:      acetaminophen (TYLENOL) tablet, 650 mg, Oral, Q4H PRN  acyclovir (ZOVIRAX) 400 mg tablet, 800 mg, Oral, 5x/day  atorvastatin  (LIPITOR) tablet, 40 mg, Oral, QPM  Correction/SSIP insulin lispro (HumaLOG) 100 units/mL injection, 0-18 Units, Subcutaneous, 4x/day AC  dapagliflozin (FARXIGA) tablet, 10 mg, Oral, Daily  dextrose (GLUTOSE) 40% oral gel, 15 g, Oral, Q15 Min PRN  dextrose 50% (0.5 g/mL) injection - syringe, 12.5 g, Intravenous, Q15 Min PRN  [Held by provider] diazePAM (VALIUM) tablet, 5 mg, Oral, Daily  diphenhydrAMINE-zinc acetate (BENADRYL EXTRA STRENGTH) 2% topical cream, , Apply Topically, 4x/day PRN  docusate sodium (COLACE) capsule, 100 mg, Oral, 2x/day PRN  enalapril (VASOTEC) tablet, 10 mg, Oral, Daily  ergocalciferol-vitamin D2 (DRISDOL) capsule, 50,000 Units, Oral, Q7 Days  glucagon (GLUCAGEN DIAGNOSTIC KIT) injection 1 mg, 1 mg, IntraMUSCULAR, Once PRN  heparin 5,000 unit/mL injection, 5,000 Units, Subcutaneous, Q8HRS  HYDROcodone-acetaminophen (NORCO) 7.5 mg-325 mg per tablet, 1 Tablet, Oral, Q6H PRN  insulin glargine 100 units/mL injection, 10 Units, Subcutaneous, NIGHTLY  latanoprost (XALATAN) 0.005 % ophthalmic solution, 1 Drop, Left Eye, NIGHTLY  Levetiracetam Tablet Sustained Release 24 hr 500 mg, 1 Tablet, Oral, NIGHTLY  magnesium oxide (MAG-OX) 400mg  (241.3 mg elemental magnesium) tablet,  800 mg, Oral, Daily  meclizine (ANTIVERT) tablet, 25 mg, Oral, Q8HRS  NS flush syringe, 3 mL, Intracatheter, Q8HRS  NS flush syringe, 3 mL, Intracatheter, Q1H PRN  ondansetron (ZOFRAN) 2 mg/mL injection, 4 mg, Intravenous, Q6H PRN  SUMAtriptan (IMITREX) tablet, 50 mg, Oral, Once PRN  venlafaxine (EFFEXOR XR) 24 hr extended release capsule, 37.5 mg, Oral, NIGHTLY          Labs:  Recent Results (from the past 48 hour(s))   CBC WITH DIFF    Collection Time: 08/04/23  4:19 AM   Result Value    WBC 6.7    HGB 14.8 (H)    HCT 43.9 (H)    PLATELETS 166      Results for orders placed or performed during the hospital encounter of 07/29/23 (from the past 48 hour(s))   BASIC METABOLIC PANEL    Collection Time: 08/04/23  4:19 AM   Result  Value    SODIUM 139    POTASSIUM 4.5    CHLORIDE 106    CO2 TOTAL 26    GLUCOSE 176 (H)    BUN 27 (H)    CREATININE 0.92      No results found for this or any previous visit (from the past 48 hour(s)).     No results found for this or any previous visit (from the past 48 hour(s)).     No results found for this or any previous visit (from the past 48 hour(s)).     Results for orders placed or performed in visit on 07/08/23 (from the past 1344 hour(s))   HGA1C (HEMOGLOBIN A1C WITH EST AVG GLUCOSE)    Collection Time: 07/08/23  1:42 PM   Result Value    HEMOGLOBIN A1C 9.5 (H)      No results found for this or any previous visit (from the past 48 hour(s)).     Microbiology:  No results found for any visits on 07/29/23 (from the past 96 hour(s)).      Imaging:   TRANSTHORACIC ECHOCARDIOGRAM - ADULT  **See full report in linked PDF document**    S                                                                                                [facility]                                                                                          [address] [place], [place] [address]  Transthoracic Echocardiographic Report              ______________________________________________________________________________  Name: PARKER, COUTANT                                  MRN: [id number]               Weight: 197 lb  Study Date: 07/30/2023 03:02 PM                         DOB: September 18, 1951             Height: 65 in  Gender: Female                                          Age: 72 yrs                 BSA: 2.0 m2  Accession #: [id number]                              BP: 133/74 mmHg  Patient Location: PRN NON INVASIVE CARD PRN  Ordering Provider: Omelia Blackwater  Tech: Leavy Cella              ______________________________________________________________________________  Procedure:  Transthoracic complete echo, 2D, spectral and tissue  Doppler, color flow Doppler, M-mode. Agitated saline bubble study was performed.    Quality:  The study images were of technically good quality.    Indications: CVA (cerebral vascular accident) (CMS HCC)    Conclusions:  Normal left ventricular size.  The left ventricular ejection fraction was calculated using the biplane Simpson`s rule method.  Left ventricular systolic function is normal.  Left ventricular diastolic parameters are normal.  Agitated saline bubble study is negative for intracardiac shunt.    Ejection Fraction is 53.5 %.    Findings  Left Ventricle:   Normal left ventricular size. Normal geometry. Ejection Fraction is 53.5 %. Left ventricular systolic function is normal. The left ventricular ejection fraction was calculated using  the biplane Simpson`s rule method. Regional wall motion abnormalities cannot be excluded due to limited visualization. Left ventricular diastolic parameters are normal. The global longitudinal strain  was calculated at -13.1.  Right Ventricle:   Normal right ventricular size. Normal right ventricular systolic function. RV systolic pressure is at the upper limits of normal.  Left Atrium:   The left atrium is normal in size.  Right Atrium:   The right atrium is of normal size.  Mitral Valve:   No significant mitral regurgitation present.  Tricuspid Valve:   There is mild tricuspid regurgitation.  Aortic Valve:   The aortic valve is not well visualized. The peak velocity across the aortic valve is 176.5 cm/sec. No Aortic valve stenosis.  Pulmonic Valve:   No significant pulmonic valve regurgitation present.  Atrial Septum:   Agitated saline bubble study is negative for intracardiac shunt.  IVC/Hepatic Veins:   Normal IVC size with >50% inspiratory collapse (estimated RA pressure 3 mmHg).  Pericardium/Pleural space:   Normal pericardium with no pericardial effusion.    Electronically signed by: MD ABBAS ALI on 07/30/2023 05:43 PM  MRA INTRACRANIAL WO CONTRAST  Narrative:  Eber Jones S Wiesen    RADIOLOGIST: Karolee Stamps    MRA  INTRACRANIAL WO CONTRAST performed on 07/30/2023 11:41 AM    CLINICAL HISTORY: Stroke.  weakness and unsteady gait for 3 days, coughs and sweat for 1 week, intermittent vertigo, decreased blood sugar levels (diabetic) hx of 1 seizure and migraines    TECHNIQUE:  3D Time of Flight MRA of the head without intravenous contrast.  Axial and 3D reformatted images.    COMPARISON: Noncontrasted CT from 07/29/2023    FINDINGS:  Anatomy:  Circle of Willis anatomy is within normal limits.    Anterior Circulation:  Distal internal carotid, anterior and middle cerebral arteries appear patent without high grade stenosis.  Atherosclerotic changes of the bilateral intracranial internal carotid and left middle cerebral arteries present No large aneurysms are identified.    Posterior Circulation:  The visualized distal most vertebral, the basilar and the posterior cerebral arteries appear patent without high grade stenosis.  No large aneurysms are identified.     Impression: 1. No hemodynamically significant stenosis or major arterial vascular occlusion involving the circle of Willis.  2. Atherosclerotic changes.    Radiologist location ID: [id number]  MRA CAROTID-EXTRACRANIAL (NECK) W/WO CONTRAST  Narrative: Uvaldo Rising Maclaughlin    RADIOLOGIST: Valda Favia, MD    MRA CAROTID-EXTRACRANIAL (NECK) W/WO CONTRAST performed on 07/30/2023 11:45 AM    CLINICAL HISTORY: Stroke.  weakness and unsteady gait for 3 days, coughs and sweat for 1 week, intermittent vertigo, decreased blood sugar levels (diabetic) hx of 1 seizure and migraines    TECHNIQUE:  Neck MRA using 2D and 3D Time of Flight technique and with intravenous gadolinium-based contrast.    INTRAVENOUS CONTRAST: 10 ml's of Gadavist    COMPARISON:  None.    FINDINGS:  Flow:  2D Time of Flight images demonstrate antegrade carotid and vertebral artery flow.    Carotids:  No evidence of significant stenosis on time of flight or  postcontrast images.           Right ICA stenosis (NASCET):  10 %         Left ICA stenosis (NASCET):  20 %    Vertebrals:  Codominant without evidence of high grade stenosis.    Arch:  No significant stenosis identified at the arch vessel origins, brachiocephalic or proximal subclavian arteries.  Impression: NO EVIDENCE OF SIGNIFICANT CAROTID OR VERTEBRAL ARTERY STENOSIS ON NECK MRA.    Radiologist location ID: [id number]  MRI BRAIN WO CONTRAST  Narrative: Uvaldo Rising Sobek    RADIOLOGIST: Alvester Chou, MD    MRI BRAIN WO CONTRAST performed on 07/30/2023 11:47 AM    CLINICAL HISTORY: TIA.  weakness, unsteady gait x3days, cough and sweat for 1 week, intermittent vertigo, decreased blood sugar levels (diabetic), hx of 1 seizure 5 yrs ago and migraines    TECHNIQUE: Noncontrast brain MRI.    COMPARISON:  None.    FINDINGS:  Brain: Ventricular and sulcal prominence is present with moderate cerebral volume loss. There are multiple foci of T2 prolongation cerebral white matter as well as in the pons which are likely due to chronic microvascular changes. There is an area of T2 prolongation in the right frontal subcortical white matter likely due to an old infarct.    Diffusion weighted images show no evidence of acute or recent infarct.     Ventricles: Consistent with the overall degree of cerebral atrophy.    Major Intracranial Vessels: Normal flow voids.    Sinuses: Clear.  Mastoids: Clear.  Impression: 1. NO ACUTE OR SUBACUTE INFARCT  2. MODERATE  CEREBRAL ATROPHY WITH NONSPECIFIC WHITE MATTER AND PONTINE SIGNAL ABNORMALITIES. THIS IS LIKELY DUE TO CHRONIC MICROVASCULAR CHANGES  3. ABNORMAL T2 PROLONGATION IN RIGHT FRONTAL SUBCORTICAL WHITE MATTER LIKELY AN OLD INFARCT    Radiologist location ID: [id number]        Assessment/ Plan:   Active Hospital Problems   (*Primary Problem)    Diagnosis    *TIA (transient ischemic attack)    Asymptomatic bradycardia    Diarrhea    Vertigo    Uncontrolled type 2 diabetes mellitus  with hyperglycemia (CMS HCC)    Hypertension     72 year old female with past medical history of TIA hypertension hyperlipidemia diabetes mellitus coming in with unsteady gait that was preceded with nausea vomiting and diarrhea.  Symptoms suggestive of probable CVA in view of her risk factors but rule out vasovagal and all medications as possible etiologies.     -CVA vs TIA rule out posterior stroke  Telemetry monitor  History of A-Fib, no  TPA candidate, no  Dysphagia screen:  Passed  PT/OT evaluation  2D echo, EF 50-55% no intracardiac shunts, MRI and MRA head and neck were unremarkable for acute findings  CT head was unremarkable for acute findings  Frequent neuro checks  Maintain SBP 150-160 mmHg  Follow-up lipid panel  Neurology consulted on arrival in the ED.  We consult Cardiology who believe this is likely vasovagal.  Recommending follow up with Cardiology as outpatient.    Holter monitor on d/c.     -Unsteady gait with dizziness, rule out BPPV  Orthostatic vital signs  Meclizine  We will check , B6, B1 pending. Folate and syphilis, B12and HIV unremarkable.  Seizure precautions  Encourage oral hydration  Epley maneuver, performed yesterday.  We will try again and follow up with a PT/OT.  PT evaluation  Patient on Keppra and Librium.    -Bradycardia with dizziness  As per Cardiology bradycardia is likely vagal response.  No need for pacemaker at this time.  Patient follow up with the Cardiology as outpatient per cardiology service.    -Hyperkalemia  Lokelma offered  We will continue to monitor BM     -Diabetes mellitus  hemoglobin A1c, 9.5 on 07/08/23  Lantus with insulin sliding scale fingerstick blood sugar a.c. HS     -Hyperlipidemia  Lipid panel performed in July/2024 noted.  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     -History of seizures  Continue with Keppra     -Age-related physical debility  PT OT evaluation for discharge disposition planning recommending inpatient rehab.     For other chronic conditions, will resume home medications upon medication reconciliation.     Code status: Full Code  DVT prophylaxis:  SCDs    Diet: DIET DIABETIC Calorie amount: CC 1800; Do you want to initiate MNT Protocol? Yes      Disposition Planning:  Inpatient rehab.  We will follow up with case management for discharge disposition planning.    Omelia Blackwater, MD  08/04/2023  [facility] MEDICINE HOSPITALIST

## 2023-08-05 ENCOUNTER — Ambulatory Visit (INDEPENDENT_AMBULATORY_CARE_PROVIDER_SITE_OTHER): Payer: Medicare Other | Admitting: Physician Assistant

## 2023-08-05 DIAGNOSIS — R001 Bradycardia, unspecified: Secondary | ICD-10-CM

## 2023-08-05 DIAGNOSIS — I639 Cerebral infarction, unspecified: Secondary | ICD-10-CM

## 2023-08-05 DIAGNOSIS — R2681 Unsteadiness on feet: Secondary | ICD-10-CM

## 2023-08-05 DIAGNOSIS — R54 Age-related physical debility: Secondary | ICD-10-CM

## 2023-08-05 DIAGNOSIS — L989 Disorder of the skin and subcutaneous tissue, unspecified: Secondary | ICD-10-CM

## 2023-08-05 DIAGNOSIS — E875 Hyperkalemia: Secondary | ICD-10-CM

## 2023-08-05 DIAGNOSIS — E785 Hyperlipidemia, unspecified: Secondary | ICD-10-CM

## 2023-08-05 DIAGNOSIS — Z8673 Personal history of transient ischemic attack (TIA), and cerebral infarction without residual deficits: Secondary | ICD-10-CM

## 2023-08-05 DIAGNOSIS — G40909 Epilepsy, unspecified, not intractable, without status epilepticus: Secondary | ICD-10-CM

## 2023-08-05 DIAGNOSIS — Z794 Long term (current) use of insulin: Secondary | ICD-10-CM

## 2023-08-05 DIAGNOSIS — I10 Essential (primary) hypertension: Secondary | ICD-10-CM

## 2023-08-05 DIAGNOSIS — E1165 Type 2 diabetes mellitus with hyperglycemia: Secondary | ICD-10-CM

## 2023-08-05 LAB — POC BLOOD GLUCOSE (RESULTS)
GLUCOSE, POC: 215 mg/dl — ABNORMAL HIGH (ref 70–100)
GLUCOSE, POC: 99 mg/dl (ref 70–100)
GLUCOSE, POC: 247 mg/dl — ABNORMAL HIGH (ref 70–100)
GLUCOSE, POC: 200 mg/dl — ABNORMAL HIGH (ref 70–100)
GLUCOSE, POC: 289 mg/dl — ABNORMAL HIGH (ref 70–100)

## 2023-08-05 LAB — VITAMIN B6 (PYRIDOXAL 5-PHOSPHATE), PLASMA: VITAMIN B6, PLASMA: 3.5 ng/mL (ref 2.1–21.7)

## 2023-08-05 NOTE — Progress Notes (Signed)
[place] MEDICINE [facility]    HOSPITALIST PROGRESS NOTE    [name]  Date of service: 08/05/2023  Date of Admission:  07/29/2023  Hospital Day:  LOS: 7 days     Subjective:   Patient reports feeling better with the almost no dizziness except when she gets off of toilet seat.  She denies chest pain shortness of breath.  Continues to have some skin lesions which are not changing or getting worse under her left breast.    Patient states that she does not want go to [facility] but any other rehab facility.  Case management following up with a.    States that is follows with a neurologist who placed on Keppra.  She was also noted to take valium.  The combination may also be contributing to her symptoms.     Vital Signs:  Filed Vitals:    08/04/23 2155 08/05/23 0016 08/05/23 0400 08/05/23 0746   BP:  108/66  (!) 148/90   Pulse: 68 75  73   Resp:  (!) 22  18   Temp:  36.5 C (97.7 F)  36.4 C (97.6 F)   SpO2:  93% 96% 97%        Physical Exam:  General:  Patient in NAD, resting in bed, no visitors present  Head:  Normocephalic, atraumatic  Eyes:  PERRL, anicteric sclera  ENT:  Oral mucosa moist, no nasal discharge   Neck:  Soft, supple, trachea midline  Heart:  RRR, S1 and S2 normal  Lungs:  Unlabored respirations.  Lungs are clear to auscultation bilaterally, with no wheezes, no rales, no conversational dyspnea  Abdomen:  Soft, active bowel sounds, non-tender to palpation, non-distended  Extremities:  Pulses equal bilaterally.  Capillary refill less than 3 seconds.  No edema in lower extremities bilaterally   Skin:  Warm and dry, not diaphoretic.  No ecchymosis noted.  Erythema noted left under breast.  Neuro:  A&O x 3.  No focal deficits.  Speech intact  Psych:  Cooperative, not agitated    Intake & Output:    Intake/Output Summary (Last 24 hours) at 08/05/2023 1033  Last data filed at 08/04/2023 1800  Gross per 24 hour   Intake 600 ml   Output --   Net 600 ml     I/O current shift:  No intake/output  data recorded.  Emesis:    BM:    Date of Last Bowel Movement: 07/30/23  Heme:      acetaminophen (TYLENOL) tablet, 650 mg, Oral, Q4H PRN  acyclovir (ZOVIRAX) 400 mg tablet, 800 mg, Oral, 5x/day  atorvastatin (LIPITOR) tablet, 40 mg, Oral, QPM  Correction/SSIP insulin lispro (HumaLOG) 100 units/mL injection, 0-18 Units, Subcutaneous, 4x/day AC  dapagliflozin (FARXIGA) tablet, 10 mg, Oral, Daily  dextrose (GLUTOSE) 40% oral gel, 15 g, Oral, Q15 Min PRN  dextrose 50% (0.5 g/mL) injection - syringe, 12.5 g, Intravenous, Q15 Min PRN  [Held by provider] diazePAM (VALIUM) tablet, 5 mg, Oral, Daily  diphenhydrAMINE-zinc acetate (BENADRYL EXTRA STRENGTH) 2% topical cream, , Apply Topically, 4x/day PRN  docusate sodium (COLACE) capsule, 100 mg, Oral, 2x/day PRN  enalapril (VASOTEC) tablet, 10 mg, Oral, Daily  ergocalciferol-vitamin D2 (DRISDOL) capsule, 50,000 Units, Oral, Q7 Days  glucagon (GLUCAGEN DIAGNOSTIC KIT) injection 1 mg, 1 mg, IntraMUSCULAR, Once PRN  heparin 5,000 unit/mL injection, 5,000 Units, Subcutaneous, Q8HRS  HYDROcodone-acetaminophen (NORCO) 7.5 mg-325 mg per tablet, 1 Tablet, Oral, Q6H PRN  insulin glargine 100 units/mL injection, 10 Units, Subcutaneous, NIGHTLY  latanoprost (XALATAN) 0.005 % ophthalmic solution, 1 Drop, Left Eye, NIGHTLY  Levetiracetam Tablet Sustained Release 24 hr 500 mg, 1 Tablet, Oral, NIGHTLY  magnesium oxide (MAG-OX) 400mg  (241.3 mg elemental magnesium) tablet, 800 mg, Oral, Daily  meclizine (ANTIVERT) tablet, 25 mg, Oral, Q8HRS  NS flush syringe, 3 mL, Intracatheter, Q8HRS  NS flush syringe, 3 mL, Intracatheter, Q1H PRN  ondansetron (ZOFRAN) 2 mg/mL injection, 4 mg, Intravenous, Q6H PRN  SUMAtriptan (IMITREX) tablet, 50 mg, Oral, Once PRN  venlafaxine (EFFEXOR XR) 24 hr extended release capsule, 37.5 mg, Oral, NIGHTLY          Labs:  Recent Results (from the past 48 hour(s))   CBC WITH DIFF    Collection Time: 08/04/23  4:19 AM   Result Value    WBC 6.7    HGB 14.8 (H)    HCT  43.9 (H)    PLATELETS 166      Results for orders placed or performed during the hospital encounter of 07/29/23 (from the past 48 hour(s))   BASIC METABOLIC PANEL    Collection Time: 08/04/23  4:19 AM   Result Value    SODIUM 139    POTASSIUM 4.5    CHLORIDE 106    CO2 TOTAL 26    GLUCOSE 176 (H)    BUN 27 (H)    CREATININE 0.92      No results found for this or any previous visit (from the past 48 hour(s)).     No results found for this or any previous visit (from the past 48 hour(s)).     No results found for this or any previous visit (from the past 48 hour(s)).     Results for orders placed or performed in visit on 07/08/23 (from the past 1344 hour(s))   HGA1C (HEMOGLOBIN A1C WITH EST AVG GLUCOSE)    Collection Time: 07/08/23  1:42 PM   Result Value    HEMOGLOBIN A1C 9.5 (H)      No results found for this or any previous visit (from the past 48 hour(s)).     Microbiology:  No results found for any visits on 07/29/23 (from the past 96 hour(s)).      Imaging:   TRANSTHORACIC ECHOCARDIOGRAM - ADULT  **See full report in linked PDF document**    S                                                                                                [facility]                                                                                          [address] [place], [place] [address]  Transthoracic Echocardiographic Report              ______________________________________________________________________________  Name: NYLENE, NAN                                  MRN: [id number]               Weight: 197 lb  Study Date: 07/30/2023 03:02 PM                         DOB: 1951/07/30             Height: 65 in  Gender: Female                                          Age: 5 yrs                 BSA: 2.0 m2  Accession #: [id number]                              BP: 133/74 mmHg  Patient Location: PRN NON INVASIVE CARD  PRN  Ordering Provider: Omelia Blackwater  Tech: Leavy Cella              ______________________________________________________________________________  Procedure:  Transthoracic complete echo, 2D, spectral and tissue Doppler, color flow Doppler, M-mode. Agitated saline bubble study was performed.    Quality:  The study images were of technically good quality.    Indications: CVA (cerebral vascular accident) (CMS HCC)    Conclusions:  Normal left ventricular size.  The left ventricular ejection fraction was calculated using the biplane Simpson`s rule method.  Left ventricular systolic function is normal.  Left ventricular diastolic parameters are normal.  Agitated saline bubble study is negative for intracardiac shunt.    Ejection Fraction is 53.5 %.    Findings  Left Ventricle:   Normal left ventricular size. Normal geometry. Ejection Fraction is 53.5 %. Left ventricular systolic function is normal. The left ventricular ejection fraction was calculated using  the biplane Simpson`s rule method. Regional wall motion abnormalities cannot be excluded due to limited visualization. Left ventricular diastolic parameters are normal. The global longitudinal strain  was calculated at -13.1.  Right Ventricle:   Normal right ventricular size. Normal right ventricular systolic function. RV systolic pressure is at the upper limits of normal.  Left Atrium:   The left atrium is normal in size.  Right Atrium:   The right atrium is of normal size.  Mitral Valve:   No significant mitral regurgitation present.  Tricuspid Valve:   There is mild tricuspid regurgitation.  Aortic Valve:   The aortic valve is not well visualized. The peak velocity across the aortic valve is 176.5 cm/sec. No Aortic valve stenosis.  Pulmonic Valve:   No significant pulmonic valve regurgitation present.  Atrial Septum:   Agitated saline bubble study is negative for intracardiac shunt.  IVC/Hepatic Veins:   Normal IVC size with >50% inspiratory collapse  (estimated RA pressure 3 mmHg).  Pericardium/Pleural space:   Normal pericardium with no pericardial effusion.    Electronically signed by: MD ABBAS ALI on 07/30/2023 05:43 PM  MRA INTRACRANIAL WO CONTRAST  Narrative: Eber Jones S Meidinger    RADIOLOGIST: Karolee Stamps    MRA  INTRACRANIAL WO CONTRAST performed on 07/30/2023 11:41 AM    CLINICAL HISTORY: Stroke.  weakness and unsteady gait for 3 days, coughs and sweat for 1 week, intermittent vertigo, decreased blood sugar levels (diabetic) hx of 1 seizure and migraines    TECHNIQUE:  3D Time of Flight MRA of the head without intravenous contrast.  Axial and 3D reformatted images.    COMPARISON: Noncontrasted CT from 07/29/2023    FINDINGS:  Anatomy:  Circle of Willis anatomy is within normal limits.    Anterior Circulation:  Distal internal carotid, anterior and middle cerebral arteries appear patent without high grade stenosis.  Atherosclerotic changes of the bilateral intracranial internal carotid and left middle cerebral arteries present No large aneurysms are identified.    Posterior Circulation:  The visualized distal most vertebral, the basilar and the posterior cerebral arteries appear patent without high grade stenosis.  No large aneurysms are identified.     Impression: 1. No hemodynamically significant stenosis or major arterial vascular occlusion involving the circle of Willis.  2. Atherosclerotic changes.    Radiologist location ID: [id number]  MRA CAROTID-EXTRACRANIAL (NECK) W/WO CONTRAST  Narrative: Uvaldo Rising Harton    RADIOLOGIST: Valda Favia, MD    MRA CAROTID-EXTRACRANIAL (NECK) W/WO CONTRAST performed on 07/30/2023 11:45 AM    CLINICAL HISTORY: Stroke.  weakness and unsteady gait for 3 days, coughs and sweat for 1 week, intermittent vertigo, decreased blood sugar levels (diabetic) hx of 1 seizure and migraines    TECHNIQUE:  Neck MRA using 2D and 3D Time of Flight technique and with intravenous gadolinium-based contrast.    INTRAVENOUS CONTRAST: 10  ml's of Gadavist    COMPARISON:  None.    FINDINGS:  Flow:  2D Time of Flight images demonstrate antegrade carotid and vertebral artery flow.    Carotids:  No evidence of significant stenosis on time of flight or postcontrast images.           Right ICA stenosis (NASCET):  10 %         Left ICA stenosis (NASCET):  20 %    Vertebrals:  Codominant without evidence of high grade stenosis.    Arch:  No significant stenosis identified at the arch vessel origins, brachiocephalic or proximal subclavian arteries.  Impression: NO EVIDENCE OF SIGNIFICANT CAROTID OR VERTEBRAL ARTERY STENOSIS ON NECK MRA.    Radiologist location ID: [id number]  MRI BRAIN WO CONTRAST  Narrative: Uvaldo Rising Erler    RADIOLOGIST: Alvester Chou, MD    MRI BRAIN WO CONTRAST performed on 07/30/2023 11:47 AM    CLINICAL HISTORY: TIA.  weakness, unsteady gait x3days, cough and sweat for 1 week, intermittent vertigo, decreased blood sugar levels (diabetic), hx of 1 seizure 5 yrs ago and migraines    TECHNIQUE: Noncontrast brain MRI.    COMPARISON:  None.    FINDINGS:  Brain: Ventricular and sulcal prominence is present with moderate cerebral volume loss. There are multiple foci of T2 prolongation cerebral white matter as well as in the pons which are likely due to chronic microvascular changes. There is an area of T2 prolongation in the right frontal subcortical white matter likely due to an old infarct.    Diffusion weighted images show no evidence of acute or recent infarct.     Ventricles: Consistent with the overall degree of cerebral atrophy.    Major Intracranial Vessels: Normal flow voids.    Sinuses: Clear.  Mastoids: Clear.  Impression: 1. NO ACUTE OR SUBACUTE INFARCT  2. MODERATE  CEREBRAL ATROPHY WITH NONSPECIFIC WHITE MATTER AND PONTINE SIGNAL ABNORMALITIES. THIS IS LIKELY DUE TO CHRONIC MICROVASCULAR CHANGES  3. ABNORMAL T2 PROLONGATION IN RIGHT FRONTAL SUBCORTICAL WHITE MATTER LIKELY AN OLD INFARCT    Radiologist location ID:  [id number]        Assessment/ Plan:   Active Hospital Problems   (*Primary Problem)    Diagnosis    *TIA (transient ischemic attack)    Asymptomatic bradycardia    Diarrhea    Vertigo    Uncontrolled type 2 diabetes mellitus with hyperglycemia (CMS HCC)    Hypertension     72 year old female with past medical history of TIA hypertension hyperlipidemia diabetes mellitus coming in with unsteady gait that was preceded with nausea vomiting and diarrhea.  Symptoms suggestive of probable CVA in view of her risk factors but rule out vasovagal and all medications as possible etiologies.     -CVA vs TIA rule out posterior stroke  Telemetry monitor  History of A-Fib, no  TPA candidate, no  Dysphagia screen:  Passed  PT/OT evaluation  2D echo, EF 50-55% no intracardiac shunts, MRI and MRA head and neck were unremarkable for acute findings  CT head was unremarkable for acute findings  Frequent neuro checks  Maintain SBP 150-160 mmHg  Follow-up lipid panel  Neurology consulted on arrival in the ED.  We consult Cardiology who believe this is likely vasovagal.  Recommending follow up with Cardiology as outpatient.    Holter monitor on d/c.     -Unsteady gait with dizziness, rule out BPPV  Orthostatic vital signs  Meclizine  We will check , B6, B1 pending. Folate and syphilis, B12and HIV unremarkable.  Seizure precautions  Encourage oral hydration  Epley maneuver, performed yesterday.  We will try again and follow up with a PT/OT.  PT evaluation  Patient on Keppra and Librium.    -Bradycardia with dizziness  As per Cardiology bradycardia is likely vagal response.  No need for pacemaker at this time.  Patient follow up with the Cardiology as outpatient per cardiology service.    -Hyperkalemia  Lokelma offered  We will continue to monitor BM     -Diabetes mellitus  hemoglobin A1c, 9.5 on 07/08/23  Lantus with insulin sliding scale fingerstick blood sugar a.c. HS     -Hyperlipidemia  Lipid panel performed in July/2024  noted.  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      -Hypertension, controlled  Continue with Vasotec, to adjust medications as blood pressure responds.     -History of seizures  Continue with Keppra     -Age-related physical debility  PT OT evaluation for discharge disposition planning recommending inpatient rehab.     For other chronic conditions, will resume home medications upon medication reconciliation.     Code status: Full Code  DVT prophylaxis:  SCDs    Diet: DIET DIABETIC Calorie amount: CC 1800; Do you want to initiate MNT Protocol? Yes      Disposition Planning:  Inpatient rehab recommended upon PT evaluation. Patient stating she might want go home with home health.  Case management following up with discharge disposition planning.    Omelia Blackwater, MD  08/05/2023  [facility] MEDICINE HOSPITALIST

## 2023-08-05 NOTE — PT Treatment (Signed)
[facility]  [address]  [place], [address]  [phone]  (Fax) [phone]  Rehabilitation Department  Physical Therapy Daily Inpatient Note    Date: 08/05/2023  Patient's Name: Grace Hoffman  Date of Birth: 1951/11/02  Height: Height: 165.1 cm (5\' 5" )  Weight: Weight: 86.5 kg (190 lb 12.8 oz)      Plan: Will continue under current POC.     Discharge Recommendation: Home with Home Health, skilled        Subjective/Objective/Assessment:  Flowsheet    08/05/23 1336   Rehab Session   Document Type therapy progress note (daily note)   PT Visit Date 08/05/23   General Information   Patient Profile Reviewed yes   Medical Lines PIV Line;Telemetry   Respiratory Status room air   Existing Precautions/Restrictions fall precautions   Pre Treatment Status   Pre Treatment Patient Status Patient sitting on edge of bed   Support Present Pre Treatment  None   Communication Pre Treatment  Charge Nurse   Communication Pre Treatment Comment cleared for PT   Pre-Treatment Pain   Pretreatment Pain Rating 6/10   Pre/Posttreatment Pain Comment headacke   Transfer Assessment/Treatment   Sit-Stand Independence stand-by assistance   Stand-Sit Independence stand-by assistance   Sit-Stand-Sit, Assist Device walker, front wheeled   Transfer Comment no dizziness   Gait Assessment/Treatment   Total Distance Ambulated 200   Independence  contact guard assist   Assistive Device  walker, front wheeled   Comment no lob, no co mplaints of dizziness   Balance   Sitting Balance: Static good balance   Sitting, Dynamic (Balance) good balance   Sit-to-Stand Balance fair + balance   Standing Balance: Static fair + balance   Post Treatment Status   Post Treatment Patient Status Patient sitting in bedside chair or w/c   Support Present Post Treatment  Family present   Patient Effort good   Post-Treatment Pain   Posttreatment Pain Rating 0/10 - no pain   Cognitive Assessment/Intervention   Behavior/Mood  Observations behavior appropriate to situation, WNL/WFL   Physical Therapy Time and Intention   Total PT Minutes: 11     Patient sitting eob with no bed alarmed activated, stood with sba, gaited with rw 277ft, no lob, no dizziness noted, returned to the eob with needs in reach, family present, nsg notified patient was not alarmed, stated that is ok            Intervention minutes: GAIT TRAINING [address] MINUTES    THERAPIST  Lane Hacker, PTA  08/05/2023, 14:46

## 2023-08-05 NOTE — Care Management Notes (Addendum)
[facility]  Care Management Note    Patient Name: Grace Hoffman  Date of Birth: [birth year]  Sex: female  Date/Time of Admission: 07/29/2023 12:21 PM  Room/Bed: 326/A  Payor: MEDICARE / Plan: MEDICARE PART A AND B / Product Type: Medicare /    LOS: 7 days   Primary Care Providers:  Alvis, Amy, PA-C, PA-C (General)    Admitting Diagnosis:  TIA (transient ischemic attack) [G45.9]    Assessment:       Discharge Plan:  Acute Rehab Placement/Return (not psych) (code 62)  THE HOSPITALIST INFORMED CM THAT PATIENT IS STATING THAT She MAY NOT GO TO [facility] AFTER ALL. CM SPOKE WITH THE PATIENT AND WAS TOLD THE FOLLOWING: The patient is concerned about whether or not her husband can bring her clothing to her at [facility] and get the bills written on time(He IS 78). He will be at the hospital after a 10 dr appointment today and once she has spoken to him, she will let CM know if she thinks she can go to rehab. She wants to go home and get her CLOTHING together and pay the bills and then go to rehab in a day or two. CM told PATIENT that is not an option with a SNF and could be a possibility with [facility] but [facility] takes patients from [facility] before patients from home and they couldn't guarantee how long it would be-possibly a week or longer. CM IS certainly encouraging THE PATIENT to go straight to [facility] and see if her husband can pay the bills or bring the paperwork to her at the hospital/[facility]. Roxanne, [facility], will speak with her today as well and did say that they have hospital gowns and pants that the patient can wear at their facility. CM SENT A SECURE CHAT TO THE HOSPITALIST WITH THIS UPDATE.   CM SPOKE WITH HUSBAND WHO IS WILLING TO ASSIST AND PATIENT IS WILLING TO D/C TO [facility]. CM UPDATED HOSPITALIST WHO STATES THAT THE PATIENT WILL PROBABLY BE READY FOR DISCHARGE TOMORROW. CM SPOKE WITH ROXANNE, [facility], WHO SAID THEY SHOULD HAVE A BED FOR THE PATIENT TOMORROW EVENING AFTER 5 OR 6  PM. BRITTANY IS SPEAKING WITH THE PATIENT CURRENTLY. CM UPDATED HOSPITALIST REGARDING PROBABLE [facility] BED TOMORROW AFTER 5-6 PM.     The patient will continue to be evaluated for developing discharge needs.     Case Manager: Luiz Ochoa, SOCIAL WORKER  Phone: [phone]

## 2023-08-06 ENCOUNTER — Ambulatory Visit (INDEPENDENT_AMBULATORY_CARE_PROVIDER_SITE_OTHER): Payer: Self-pay | Admitting: NEUROLOGY

## 2023-08-06 ENCOUNTER — Other Ambulatory Visit (HOSPITAL_COMMUNITY)
Admit: 2023-08-06 | Discharge: 2023-08-06 | Disposition: A | Discharge status: Home / Self Care | Payer: Self-pay | Attending: PHYSICAL MEDICINE AND REHABILITATION | Admitting: PHYSICAL MEDICINE AND REHABILITATION

## 2023-08-06 ENCOUNTER — Other Ambulatory Visit (HOSPITAL_COMMUNITY): Payer: Self-pay | Admitting: PHYSICAL MEDICINE AND REHABILITATION

## 2023-08-06 LAB — CBC WITH DIFF
BASOPHIL #: 0.1 10*3/uL (ref 0.00–0.10)
BASOPHIL %: 1 % (ref 0–1)
EOSINOPHIL #: 0.3 10*3/uL (ref 0.00–0.50)
EOSINOPHIL %: 4 % (ref 1–7)
HCT: 46.1 % — ABNORMAL HIGH (ref 31.2–41.9)
HGB: 15.3 g/dL — ABNORMAL HIGH (ref 10.9–14.3)
LYMPHOCYTE #: 2.3 10*3/uL (ref 1.00–3.00)
LYMPHOCYTE %: 33 % (ref 16–44)
MCH: 32.8 pg (ref 24.7–32.8)
MCHC: 33.3 g/dL (ref 32.3–35.6)
MCV: 98.6 fL — ABNORMAL HIGH (ref 75.5–95.3)
MONOCYTE #: 0.6 10*3/uL (ref 0.30–1.00)
MONOCYTE %: 9 % (ref 5–13)
MPV: 11.1 fL — ABNORMAL HIGH (ref 7.9–10.8)
NEUTROPHIL #: 3.5 10*3/uL (ref 1.85–7.80)
NEUTROPHIL %: 53 % (ref 43–77)
PLATELETS: 185 10*3/uL (ref 140–440)
RBC: 4.68 10*6/uL (ref 3.63–4.92)
RDW: 14.7 % (ref 12.3–17.7)
WBC: 6.7 10*3/uL (ref 3.8–11.8)

## 2023-08-06 LAB — BASIC METABOLIC PANEL
ANION GAP: 8 mmol/L (ref 4–13)
BUN/CREA RATIO: 26 — ABNORMAL HIGH (ref 6–22)
BUN: 24 mg/dL (ref 7–25)
CALCIUM: 9.4 mg/dL (ref 8.6–10.3)
CHLORIDE: 106 mmol/L (ref 98–107)
CO2 TOTAL: 24 mmol/L (ref 21–31)
CREATININE: 0.93 mg/dL (ref 0.60–1.30)
ESTIMATED GFR: 64 mL/min/{1.73_m2} (ref >59)
GLUCOSE: 199 mg/dL — ABNORMAL HIGH (ref 74–109)
OSMOLALITY, CALCULATED: 285 mOsm/kg (ref 270–290)
POTASSIUM: 4.7 mmol/L (ref 3.5–5.1)
SODIUM: 138 mmol/L (ref 136–145)

## 2023-08-06 LAB — URINALYSIS, MACROSCOPIC AND MICROSCOPIC W/CULTURE REFLEX - CLIENT CONSOLIDATED
BILIRUBIN: NEGATIVE mg/dL
BLOOD: NEGATIVE mg/dL
GLUCOSE: >1000 mg/dL — AB
KETONES: NEGATIVE mg/dL
LEUKOCYTES: NEGATIVE WBCs/uL
NITRITE: NEGATIVE
PH: 5.5 (ref 5.0–9.0)
PROTEIN: NEGATIVE mg/dL
SPECIFIC GRAVITY: 1.028 (ref 1.002–1.030)
UROBILINOGEN: NORMAL mg/dL

## 2023-08-06 LAB — URINALYSIS, MACROSCOPIC
BILIRUBIN: NEGATIVE mg/dL
BLOOD: NEGATIVE mg/dL
GLUCOSE: >1000 mg/dL — AB
KETONES: NEGATIVE mg/dL
LEUKOCYTES: NEGATIVE WBCs/uL
NITRITE: NEGATIVE
PH: 5.5 (ref 5.0–9.0)
PROTEIN: NEGATIVE mg/dL
SPECIFIC GRAVITY: 1.028 (ref 1.002–1.030)
UROBILINOGEN: NORMAL mg/dL

## 2023-08-06 LAB — URINALYSIS, MICROSCOPIC
NON-SQUAMOUS EPITHELIAL CELLS URINE: <1 /hpf (ref <=1)
RBCS: 1 /hpf (ref <4)
SQUAMOUS EPITHELIAL: 1 /hpf (ref <28)
WBC CASTS: 5 /lpf — ABNORMAL HIGH (ref <1)
WBCS: 5 /hpf (ref <6)

## 2023-08-06 LAB — POC BLOOD GLUCOSE (RESULTS)
GLUCOSE, POC: 146 mg/dl — ABNORMAL HIGH (ref 70–100)
GLUCOSE, POC: 237 mg/dl — ABNORMAL HIGH (ref 70–100)
GLUCOSE, POC: 308 mg/dl — ABNORMAL HIGH (ref 70–100)

## 2023-08-06 LAB — VITAMIN B1 (THIAMIN), WHOLE BLOOD: VITAMIN B1 (THIAMINE), BLOOD, LC/MS/MS: 155 nmol/L (ref 78–185)

## 2023-08-06 MED ORDER — NYSTATIN-TRIAMCINOLONE 100,000 UNIT/G-0.1 % TOPICAL CREAM
TOPICAL_CREAM | Freq: Four times a day (QID) | CUTANEOUS | 0 refills | Status: AC
Start: 2023-08-06 — End: 2023-08-20

## 2023-08-06 MED ORDER — MECLIZINE 25 MG TABLET
25.0000 mg | ORAL_TABLET | Freq: Three times a day (TID) | ORAL | 0 refills | Status: AC
Start: 2023-08-06 — End: 2023-08-20

## 2023-08-06 MED ORDER — HYDROCODONE 7.5 MG-ACETAMINOPHEN 325 MG TABLET
1.0000 | ORAL_TABLET | Freq: Three times a day (TID) | ORAL | 0 refills | Status: DC | PRN
Start: 2023-08-06 — End: 2023-08-18

## 2023-08-06 NOTE — Nurses Notes (Signed)
Patient to be discharged to [facility]. Bed is not available at this time. staff report bed will be available between 5-6 pm .

## 2023-08-06 NOTE — PT Treatment (Signed)
[facility]  [address]  [place], [address]  [phone]  (Fax) [phone]  Rehabilitation Department  Physical Therapy Daily Inpatient Note    Date: 08/06/2023  Patient's Name: Grace Hoffman  Date of Birth: [birth year]  Height: Height: 165.1 cm (5\' 5" )  Weight: Weight: 86.5 kg (190 lb 12.8 oz)      Plan: Will continue under current POC.     Discharge Recommendation: Extended Care Facility and Skilled Nursing Facility        Subjective/Objective/Assessment:  Flowsheet    08/06/23 1103   Rehab Session   Document Type therapy progress note (daily note)   PT Visit Date 08/06/23   General Information   Patient Profile Reviewed yes   Medical Lines PIV Line   Respiratory Status room air   Existing Precautions/Restrictions fall precautions   Pre Treatment Status   Pre Treatment Patient Status Patient sitting on edge of bed   Communication Pre Treatment  Charge Nurse   Communication Pre Treatment Comment cleared for PT   Pre-Treatment Pain   Pretreatment Pain Rating 0/10 - no pain   Transfer Assessment/Treatment   Sit-Stand Independence stand-by assistance   Stand-Sit Independence stand-by assistance   Sit-Stand-Sit, Assist Device walker, front wheeled   Transfer Impairments balance impaired;endurance;pain;strength decreased   Gait Assessment/Treatment   Total Distance Ambulated 200   Independence  contact guard assist   Assistive Device  walker, front wheeled   Comment ues getting tired when walking   Balance   Sitting Balance: Static good balance   Sitting, Dynamic (Balance) good balance   Sit-to-Stand Balance fair + balance   Standing Balance: Static fair + balance   Post Treatment Status   Post Treatment Patient Status Patient sitting on edge of bed   Patient Effort good   Post-Treatment Pain   Posttreatment Pain Rating 0/10 - no pain   Cognitive Assessment/Intervention   Behavior/Mood Observations behavior appropriate to situation, WNL/WFL   Physical Therapy Time and  Intention   Total PT Minutes: 12     Paitent gaited with rw 223ft, no lob, ues getting tired, returned to the eob with needs in reach,             Intervention minutes: GAIT TRAINING 12 MINUTES    THERAPIST  Lane Hacker, PTA  08/06/2023, 11:43

## 2023-08-06 NOTE — Care Management Notes (Signed)
[facility]  Care Management Note    Patient Name: Grace Hoffman  Date of Birth: 06-02-1951  Sex: female  Date/Time of Admission: 07/29/2023 12:21 PM  Room/Bed: 326/A  Payor: MEDICARE / Plan: MEDICARE PART A AND B / Product Type: Medicare /    LOS: 8 days   Primary Care Providers:  Alvis, Amy, PA-C, PA-C (General)    Admitting Diagnosis:  TIA (transient ischemic attack) [G45.9]    Assessment:       Discharge Plan:  Acute Rehab Placement/Return (not psych) (code 62)  10:23 ROXANNE, [facility], STATES BED TODAY AROUND 5-6 PM AND ASKED IF PATIENT IS STILL ABLE TO DISCHARGE TODAY. CM SPOKE WITH PATIENT'S NURSE WHO REPORTED THAT HOSPITALIST TEAM IS STILL PLANNING D/C TODAY. CM UPDATED ROXANNE AND LET PATIENT'S NURSE KNOW THAT THE [facility] CAN ACCEPT TODAY AROUND 5-6 PM.     The patient will continue to be evaluated for developing discharge needs.     Case Manager: Luiz Ochoa, SOCIAL WORKER  Phone: [phone]

## 2023-08-06 NOTE — Nurses Notes (Signed)
Report called to Debbie at [facility], reports they are unsure if they have bed for patient due to limited bed availability and possible shingles

## 2023-08-06 NOTE — Nurses Notes (Signed)
Spoke with Grace Hoffman at [facility], it is okay to send patient at this time. Patient belongings bagged and sent with patient. Patients IV site removed. Telemetry removed. Patient pushed in wheelchair at this time.

## 2023-08-06 NOTE — Nurses Notes (Signed)
Attempted to call report, [facility] reports no one can take report at this time

## 2023-08-07 ENCOUNTER — Encounter (INDEPENDENT_AMBULATORY_CARE_PROVIDER_SITE_OTHER): Payer: Self-pay

## 2023-08-07 ENCOUNTER — Telehealth (HOSPITAL_COMMUNITY): Payer: Self-pay

## 2023-08-07 ENCOUNTER — Other Ambulatory Visit (HOSPITAL_COMMUNITY): Payer: Self-pay | Admitting: PHYSICAL MEDICINE AND REHABILITATION

## 2023-08-07 LAB — COMPREHENSIVE METABOLIC PANEL, NON-FASTING
ALBUMIN/GLOBULIN RATIO: 1.4 (ref 0.8–1.4)
ALBUMIN: 3.8 g/dL (ref 3.5–5.7)
ALKALINE PHOSPHATASE: 77 U/L (ref 34–104)
ALT (SGPT): 23 U/L (ref 7–52)
ANION GAP: 8 mmol/L (ref 4–13)
AST (SGOT): 17 U/L (ref 13–39)
BILIRUBIN TOTAL: 0.4 mg/dL (ref 0.3–1.0)
BUN/CREA RATIO: 29 — ABNORMAL HIGH (ref 6–22)
BUN: 30 mg/dL — ABNORMAL HIGH (ref 7–25)
CALCIUM, CORRECTED: 9.9 mg/dL (ref 8.9–10.8)
CALCIUM: 9.7 mg/dL (ref 8.6–10.3)
CHLORIDE: 104 mmol/L (ref 98–107)
CO2 TOTAL: 25 mmol/L (ref 21–31)
CREATININE: 1.05 mg/dL (ref 0.60–1.30)
ESTIMATED GFR: 56 mL/min/{1.73_m2} — ABNORMAL LOW (ref >59)
GLOBULIN: 2.8 — ABNORMAL LOW (ref 2.9–5.4)
GLUCOSE: 149 mg/dL — ABNORMAL HIGH (ref 74–109)
OSMOLALITY, CALCULATED: 283 mOsm/kg (ref 270–290)
POTASSIUM: 5 mmol/L (ref 3.5–5.1)
PROTEIN TOTAL: 6.6 g/dL (ref 6.4–8.9)
SODIUM: 137 mmol/L (ref 136–145)

## 2023-08-07 LAB — CBC
HCT: 43.5 % — ABNORMAL HIGH (ref 31.2–41.9)
HGB: 14.5 g/dL — ABNORMAL HIGH (ref 10.9–14.3)
MCH: 33 pg — ABNORMAL HIGH (ref 24.7–32.8)
MCHC: 33.4 g/dL (ref 32.3–35.6)
MCV: 98.6 fL — ABNORMAL HIGH (ref 75.5–95.3)
MPV: 11.1 fL — ABNORMAL HIGH (ref 7.9–10.8)
PLATELETS: 170 10*3/uL (ref 140–440)
RBC: 4.41 10*6/uL (ref 3.63–4.92)
RDW: 14.8 % (ref 12.3–17.7)
WBC: 8 10*3/uL (ref 3.8–11.8)

## 2023-08-07 LAB — HGA1C (HEMOGLOBIN A1C WITH EST AVG GLUCOSE): HEMOGLOBIN A1C: 8.6 % — ABNORMAL HIGH (ref 4.0–6.0)

## 2023-08-07 NOTE — Telephone Encounter (Signed)
Pt not contacted due to d/c'ing to [facility].

## 2023-08-09 LAB — URINE CULTURE,ROUTINE: URINE CULTURE: 10000 — AB

## 2023-08-18 ENCOUNTER — Ambulatory Visit: Payer: Medicare Other | Attending: Physician Assistant | Admitting: Physician Assistant

## 2023-08-18 ENCOUNTER — Encounter (INDEPENDENT_AMBULATORY_CARE_PROVIDER_SITE_OTHER): Payer: Self-pay | Admitting: Physician Assistant

## 2023-08-18 ENCOUNTER — Other Ambulatory Visit (INDEPENDENT_AMBULATORY_CARE_PROVIDER_SITE_OTHER): Payer: Self-pay | Admitting: Physician Assistant

## 2023-08-18 ENCOUNTER — Other Ambulatory Visit: Payer: Self-pay

## 2023-08-18 DIAGNOSIS — R42 Dizziness and giddiness: Secondary | ICD-10-CM

## 2023-08-18 DIAGNOSIS — F419 Anxiety disorder, unspecified: Secondary | ICD-10-CM

## 2023-08-18 DIAGNOSIS — Z8673 Personal history of transient ischemic attack (TIA), and cerebral infarction without residual deficits: Secondary | ICD-10-CM

## 2023-08-18 DIAGNOSIS — R2681 Unsteadiness on feet: Secondary | ICD-10-CM

## 2023-08-18 DIAGNOSIS — G8929 Other chronic pain: Secondary | ICD-10-CM

## 2023-08-18 DIAGNOSIS — I10 Essential (primary) hypertension: Secondary | ICD-10-CM

## 2023-08-18 DIAGNOSIS — R531 Weakness: Secondary | ICD-10-CM

## 2023-08-18 DIAGNOSIS — R001 Bradycardia, unspecified: Secondary | ICD-10-CM

## 2023-08-18 DIAGNOSIS — G459 Transient cerebral ischemic attack, unspecified: Secondary | ICD-10-CM

## 2023-08-18 DIAGNOSIS — E1165 Type 2 diabetes mellitus with hyperglycemia: Secondary | ICD-10-CM

## 2023-08-18 DIAGNOSIS — Z09 Encounter for follow-up examination after completed treatment for conditions other than malignant neoplasm: Secondary | ICD-10-CM

## 2023-08-18 MED ORDER — DIAZEPAM 5 MG TABLET
5.0000 mg | ORAL_TABLET | Freq: Every day | ORAL | 2 refills | Status: DC
Start: 2023-08-18 — End: 2023-09-15

## 2023-08-18 NOTE — Progress Notes (Addendum)
INTERNAL MEDICINE, BUILDING A  [address]  [place] [address]  Operated by [facility]  Transitional Care Management Note    Name: MORIAH GUARNIERI MRN:  [id number]   Date: 08/18/2023 Age: 72 y.o.     Chief Complaint: Hospital Discharge Transition     TELEMEDICINE DOCUMENTATION:    [facility]  - [facility] visit  Patient/family aware of provider location:  yes  Patient/family consent for telemedicine:  yes  Examination observed and performed by:  Eyvonne Mechanic, PA-C     SUBJECTIVE:  Grace Hoffman is a 72 y.o. female presenting today for follow-up after being discharged. The main problem requiring admission was unsteady gait.     [place] [facility]      DISCHARGE SUMMARY        PATIENT NAME:  LAMEKA DYNES   MRN:  [id number]  DOB:  1951-05-30     INPATIENT ADMISSION DATE: 07/29/2023   DATE OF DISCHARGE:  08/06/23      ATTENDING PHYSICIAN: Omelia Blackwater, MD      PRIMARY CARE PHYSICIAN: Eyvonne Mechanic, PA-C      HOSPITAL PRESENTATION:     Please see full admission H&P for details.       As per HPI:  72 year old female with multiple medical problems including diabetes mellitus hypertension hyperlipidemia prior TIA coming in with a 3 day history of unsteady gait.  Patient reports that this was followed by generalized weakness and loose bowel movements associated with nausea but no abdominal pain or vomiting. She reports having developed unsteady gait about 3 days ago on this morning she noted that her blood sugars low requiring orange juice to help improve her blood sugar. She denies any obvious focal weakness.  No chest pain or shortness on breath reported.     Patient reports that she was previously been told that she had a TIA as per her neurologist.     On arrival in the ED initial workup with CT head was unremarkable.  She was noted to have leukocytosis 14.6, creatinine 1.0 and tele Neurology was consulted for further recommendations.  In view of her symptoms  hospitalist service called in for admission for further evaluation.        FURTHER HOSPITAL COURSE WITH DISCHARGE DIAGNOSES:     72 year old female with past medical history of TIA hypertension hyperlipidemia diabetes mellitus coming in with unsteady gait that was preceded with nausea vomiting and diarrhea.  Symptoms suggestive of probable CVA in view of her risk factors but rule out vasovagal and all medications as possible etiologies.  For dizziness and skin rash.  Workup including MRI, MRA head and neck and CT head were unremarkable.  Further workup including vitamin B12, folate syphilis and HIV were unremarkable.      Patient seen by tele cardiology for dizziness with bradycardia which they concluded is in to orthostatic hypotension with bradycardia likely vagal response.  No permanent pacemaker indication at this time but patient follow up with Cardiology as outpatient.  2D echo showed EF 50-55%.  Bubble study was unremarkable and with no significant valvular disease.          Patient had PT evaluation who recommended inpatient rehab.  While at inpatient rehab patient will benefit from Epley maneuver for her dizziness which is likely peripheral.  She showed some improvement in her symptoms with Epley maneuver performed during hospital stay.  Patient will benefit from further evaluation by Neurology Service as outpatient.  She will also benefit from dermatology evaluation for her skin lesions.  Discussed with patient follow up with the primary care provider for referral to dermatologist.  Patient verbalized understanding discharge and follow-up instructions.  All questions answered.        Problem List:      Active Hospital Problems   (*Primary Problem)     Diagnosis    *TIA (transient ischemic attack)    Asymptomatic bradycardia    Diarrhea    Vertigo    Uncontrolled type 2 diabetes mellitus with hyperglycemia (CMS HCC)    Hypertension      LABS:  CBC with Diff (Last 48 Hours):        Recent Results in last 48  hours     08/06/23  0304   WBC 6.7   HGB 15.3*   HCT 46.1*   MCV 98.6*   PLTCNT 185   PMNS 53   LYMPHOCYTES 33   MONOCYTES 9   EOSINOPHIL 4   BASOPHILS 1  0.10            Last BMP  (Last result in the past 48 hours)          Na   K   Cl   CO2   BUN   Cr   Calcium   Glucose   Glucose-Fasting         08/06/23 0304 138    4.7    106    24    24    0.93    9.4    199                         BMP (Last 48 Hours):        Recent Results in last 48 hours     08/06/23  0304   SODIUM 138   POTASSIUM 4.7   CHLORIDE 106   CO2 24   BUN 24   CREATININE 0.93   CALCIUM 9.4   GLUCOSENF 199*            DISCHARGE MEDICATIONS:      Current Discharge Medication List          START taking these medications.         Details   meclizine 25 mg Tablet  Commonly known as: ANTIVERT    25 mg, Oral, EVERY 8 HOURS (SCHEDULED)  Qty: 42 Tablet  Refills: 0      nystatin-triamcinolone 100,000-0.1 unit/g-% Cream  Commonly known as: MYCOLOG II    Topical, 4 TIMES DAILY, Apply to affected skin  Qty: 1 Each  Refills: 0                CONTINUE these medications which have CHANGED during your visit.         Details   HYDROcodone-acetaminophen 7.5-325 mg Tablet  Commonly known as: NORCO  What changed: when to take this    1 Tablet, Oral, EVERY 8 HOURS PRN  Qty: 6 Tablet  Refills: 0      insulin lispro 100 unit/mL Insulin Pen  Commonly known as: HumaLOG KwikPen Insulin  What changed: additional instructions    Use as directed sliding scale; also use 5 units with meals  Qty: 3 mL  Refills: 2                CONTINUE these medications - NO CHANGES were made during your visit.  Details   d-mannose 500 mg Capsule    1 Capsule, Oral, 2 TIMES DAILY  Refills: 0      diclofenac sodium 20 mg/gram /actuation(2 %) solution in metered-dose pump  Commonly known as: Pennsaid    1 Application , Apply externally, 2 TIMES DAILY  Qty: 112 g  Refills: 3      empagliflozin 25 mg Tablet  Commonly known as: JARDIANCE    25 mg, Oral, DAILY  Qty: 90 Tablet  Refills: 1       enalapril maleate 10 mg Tablet  Commonly known as: VASOTEC    10 mg, Oral, DAILY  Qty: 90 Tablet  Refills: 1      ergocalciferol (vitamin D2) 1,250 mcg (50,000 unit) Capsule  Commonly known as: DRISDOL    50,000 Units, Oral, EVERY 7 DAYS  Qty: 12 Capsule  Refills: 1      glimepiride 4 mg Tablet  Commonly known as: AMARYL    TAKE 1 TABLET(4 MG) BY MOUTH TWICE DAILY WITH A MEAL FOR DIABETES  Qty: 180 Tablet  Refills: 1      insulin glargine U-300 conc 300 unit/mL (3 mL) Insulin Pen  Commonly known as: Toujeo Max U-300 SoloStar    30 Units, Subcutaneous, NIGHTLY  Qty: 6 mL  Refills: 2      latanoprost 0.005 % Drops  Commonly known as: XALATAN    1 Drop, Left Eye, NIGHTLY  Refills: 0      Levetiracetam 500 mg Tablet Sustained Release 24 hr    500 mg, Oral, NIGHTLY  Qty: 90 Tablet  Refills: 1      loratadine 10 mg Tablet  Commonly known as: CLARITIN    10 mg, Oral, DAILY PRN  Qty: 90 Tablet  Refills: 3      magnesium oxide 400 mg Tablet  Commonly known as: MAG-OX    800 mg, Oral, DAILY  Qty: 90 Tablet  Refills: 1      MetFORMIN 1,000 mg Tablet  Commonly known as: GLUCOPHAGE    1,000 mg, Oral, 2 TIMES DAILY WITH FOOD  Qty: 180 Tablet  Refills: 1      rosuvastatin 20 mg Tablet  Commonly known as: CRESTOR    20 mg, Oral, EVERY EVENING  Qty: 90 Tablet  Refills: 4      solifenacin 5 mg Tablet  Commonly known as: VESICARE    5 mg, Oral, DAILY  Qty: 90 Tablet  Refills: 1      SUMAtriptan 50 mg Tablet  Commonly known as: IMITREX    50 mg, Oral, ONCE PRN, TAKE 1 TABLET BY MOUTH AT ONSET OF HEADACHE. IF NO RELIEF MAY REPEAT 1 AFTER AT LEAST 2 HOURS. MAX OF 4 TABS PER 24 HOURS  Qty: 8 Tablet  Refills: 2      venlafaxine 37.5 mg Capsule, Sust. Release 24 hr  Commonly known as: EFFEXOR XR    37.5 mg, Oral, NIGHTLY  Qty: 90 Capsule  Refills: 1                STOP taking these medications.       diazePAM 5 mg Tablet  Commonly known as: VALIUM                      DISCHARGE DISPOSITION:  Inpatient rehab     DISCHARGE INSTRUCTIONS:      Follow-up Information         Aydrien Froman, PA-C Follow up.  Specialty: PHYSICIAN ASSISTANT  Why: FOLLOW-UP: WEDNESDAY AUGUST 14TH, 2024 @ 1:00 PM WITH Seanpatrick Maisano  Contact information:  [address]  [address]  [place] [address]  [phone]                    Lonzo Cloud, MD Follow up.    Specialty: CARDIOVASCULAR DISEASE  Why: FOLLOW-UP: [facility] WILL CALL WITH APPOINTMENT  Contact information:  [address]  [place] [address]  [phone]                    Dyke Brackett, DO Follow up.    Specialty: NEUROLOGY  Why: Dr. Renea Ee office will call when appointment is made.  Contact information:  [address]  [address]  [place] [address]  [phone]           Patient once again [facility] spent 2 days there prior to requesting to be discharged has a lack of therapy.  Patient states her time at the rehab was pretty much wasted and care was not acceptable.  She is still weak however states she wants to continue working on it herself instead of having home health physical therapy ordered for her at home.  Patient was referred to neurologist post discharge and will see him in October she also has a referral for the cardiologist but states she has not had any information on when this appointment is.  She does request medication refills on her Norco and diazepam today.    Past Medical History:   Diagnosis Date    Anxiety     Chronic back pain     COVID-19 vaccine series completed     Depression     Diabetes mellitus, type 2 (CMS HCC)     Fibromyalgia affecting multiple sites     Hypertension     Hypomagnesemia     Insomnia disorder     Migraine     Mixed hyperlipidemia     Orthostatic hypotension     Osteoarthritis     TIA (transient ischemic attack)     Unspecified glaucoma(365.9)     Vitamin D deficiency       d-mannose 500 mg Oral Capsule, Take 1 Capsule (500 mg total) by mouth Twice daily  diclofenac sodium (PENNSAID) 20 mg/gram /actuation(2 %) solution in metered-dose pump,  Apply 1 Application  topically Twice daily  empagliflozin (JARDIANCE) 25 mg Oral Tablet, Take 1 Tablet (25 mg total) by mouth Once a day  enalapril maleate (VASOTEC) 10 mg Oral Tablet, Take 1 Tablet (10 mg total) by mouth Once a day for 180 days  ergocalciferol, vitamin D2, (DRISDOL) 1,250 mcg (50,000 unit) Oral Capsule, Take 1 Capsule (50,000 Units total) by mouth Every 7 days (Patient taking differently: Take 1 Capsule (50,000 Units total) by mouth Every 7 days On Monday)  glimepiride (AMARYL) 4 mg Oral Tablet, TAKE 1 TABLET(4 MG) BY MOUTH TWICE DAILY WITH A MEAL FOR DIABETES  insulin glargine U-300 conc (TOUJEO MAX U-300 SOLOSTAR) 300 unit/mL (3 mL) Subcutaneous Insulin Pen, Inject 30 Units under the skin Every night  insulin lispro (HUMALOG KWIKPEN INSULIN) 100 unit/mL Subcutaneous Insulin Pen, Use as directed sliding scale; also use 5 units with meals Indications: type 2 diabetes mellitus (Patient taking differently: Use as directed sliding scale; also use up to 5 units with meals Indications: type 2 diabetes mellitus)  latanoprost (XALATAN) 0.005 % Ophthalmic Drops, Administer  1 Drop into the left eye Every night  Levetiracetam 500 mg Oral Tablet Sustained Release 24 hr, Take 1 Tablet (500 mg total) by mouth Every night for 90 days  loratadine (CLARITIN) 10 mg Oral Tablet, Take 1 Tablet (10 mg total) by mouth Once per day as needed  magnesium oxide (MAG-OX) 400 mg Oral Tablet, Take 2 Tablets (800 mg total) by mouth Once a day  meclizine (ANTIVERT) 25 mg Oral Tablet, Take 1 Tablet (25 mg total) by mouth Every 8 hours for 14 days  MetFORMIN (GLUCOPHAGE) 1,000 mg Oral Tablet, Take 1 Tablet (1,000 mg total) by mouth Twice daily with food  nystatin-triamcinolone (MYCOLOG II) 100,000-0.1 unit/g-% Cream, Apply topically Four times a day for 14 days Apply to affected skin  rosuvastatin (CRESTOR) 20 mg Oral Tablet, Take 1 Tablet (20 mg total) by mouth Every evening  solifenacin (VESICARE) 5 mg Oral Tablet, Take 1  Tablet (5 mg total) by mouth Once a day  SUMAtriptan (IMITREX) 50 mg Oral Tablet, Take 1 Tablet (50 mg total) by mouth Once, as needed for Migraine TAKE 1 TABLET BY MOUTH AT ONSET OF HEADACHE. IF NO RELIEF MAY REPEAT 1 AFTER AT LEAST 2 HOURS. MAX OF 4 TABS PER 24 HOURS  venlafaxine (EFFEXOR XR) 37.5 mg Oral Capsule, Sust. Release 24 hr, Take 1 Capsule (37.5 mg total) by mouth Every night    No facility-administered medications prior to visit.     Allergies   Allergen Reactions    Codeine Nausea/ Vomiting    Amoxicillin Diarrhea    Macrobid [Nitrofurantoin Monohyd/M-Cryst] Nausea/ Vomiting      Social History     Socioeconomic History    Marital status: Married    Number of children: 1    Years of education: 11   Tobacco Use    Smoking status: Never    Smokeless tobacco: Never   Vaping Use    Vaping status: Never Used   Substance and Sexual Activity    Alcohol use: Never    Drug use: Never    Sexual activity: Not Currently     Partners: Male     Social Determinants of Health     Social Connections: Low Risk  (07/29/2023)    Social Connections     SDOH Social Isolation: 5 or more times a week      OBJECTIVE:   There were no vitals taken for this visit.     Physical Exam  Pulmonary:      Comments: Patient able to speak full sentences without difficulty  Neurological:      Mental Status: She is alert.   Psychiatric:         Thought Content: Thought content normal.         Judgment: Judgment normal.          Transition of Care Contact Information  Discharge date: Discharge Date: 08/06/2023  Transition Facility Type--Hospital (Inpatient or Observation)  Facility Name--[facility] Interactive Contact(s):  Clinical Staff Name/Role who Jonetta Speak, RN BSN     Data Reviewed  Medication Reconciliation completed    Assessment and Plan    ICD-10-CM    1. Hospital discharge follow-up  Z09       2. Generalized weakness  R53.1       3. TIA (transient ischemic attack)  G45.9       4. Asymptomatic  bradycardia  R00.1       5. Vertigo  R42  6. Uncontrolled type 2 diabetes mellitus with hyperglycemia (CMS HCC)  E11.65       7. Primary hypertension  I10       8. Anxiety  F41.9       9. Chronic low back pain, unspecified back pain laterality, unspecified whether sciatica present  M54.50     G89.29         Other transition actions (Optional) -: Discharge documentation was reviewed    Total time on visit 19 mins    Post-Discharge Follow Up Appointments       Tuesday Sep 15, 2023    Return Patient Visit with Eyvonne Mechanic, PA-C at 11:00 AM      Friday Oct 09, 2023    Return Patient Visit with Arlana Hove, DO at  2:00 PM      Tuesday Oct 13, 2023    Return Patient Visit with Eyvonne Mechanic, PA-C at 11:30 AM      Friday Oct 16, 2023    New Patient Visit with Dyke Brackett, DO at  2:40 PM      Monday Apr 25, 2024    Return Telephone Visit with Eyvonne Mechanic, PA-C at  1:30 PM      Internal Medicine, Building A  Building [facility]  [address]  [place] [address]  [phone] Neurology, [facility]  [facility], [place]  [address]  [place] [address]  [phone] Urology, [facility] [place], [place]  [address]  [place] [address]  [phone]             Eyvonne Mechanic, PA-C

## 2023-08-18 NOTE — Telephone Encounter (Signed)
Patient completed telemed for routine 1 month follow up for medication refills.

## 2023-08-20 MED ORDER — HYDROCODONE 7.5 MG-ACETAMINOPHEN 325 MG TABLET
1.0000 | ORAL_TABLET | Freq: Three times a day (TID) | ORAL | 0 refills | Status: DC | PRN
Start: 2023-08-20 — End: 2023-09-15

## 2023-09-02 ENCOUNTER — Ambulatory Visit (INDEPENDENT_AMBULATORY_CARE_PROVIDER_SITE_OTHER): Payer: Self-pay | Admitting: Physician Assistant

## 2023-09-15 ENCOUNTER — Ambulatory Visit: Payer: Medicare Other | Attending: Physician Assistant | Admitting: Physician Assistant

## 2023-09-15 ENCOUNTER — Encounter (INDEPENDENT_AMBULATORY_CARE_PROVIDER_SITE_OTHER): Payer: Self-pay | Admitting: Physician Assistant

## 2023-09-15 ENCOUNTER — Other Ambulatory Visit: Payer: Self-pay

## 2023-09-15 ENCOUNTER — Other Ambulatory Visit (INDEPENDENT_AMBULATORY_CARE_PROVIDER_SITE_OTHER): Payer: Self-pay | Admitting: Physician Assistant

## 2023-09-15 VITALS — BP 122/74 | HR 90 | Wt 196.0 lb

## 2023-09-15 DIAGNOSIS — B360 Pityriasis versicolor: Secondary | ICD-10-CM | POA: Insufficient documentation

## 2023-09-15 DIAGNOSIS — G8929 Other chronic pain: Secondary | ICD-10-CM | POA: Insufficient documentation

## 2023-09-15 DIAGNOSIS — F32A Depression, unspecified: Secondary | ICD-10-CM | POA: Insufficient documentation

## 2023-09-15 DIAGNOSIS — N390 Urinary tract infection, site not specified: Secondary | ICD-10-CM | POA: Insufficient documentation

## 2023-09-15 DIAGNOSIS — Z794 Long term (current) use of insulin: Secondary | ICD-10-CM

## 2023-09-15 DIAGNOSIS — R42 Dizziness and giddiness: Secondary | ICD-10-CM | POA: Insufficient documentation

## 2023-09-15 DIAGNOSIS — M545 Low back pain, unspecified: Secondary | ICD-10-CM | POA: Insufficient documentation

## 2023-09-15 DIAGNOSIS — E538 Deficiency of other specified B group vitamins: Secondary | ICD-10-CM | POA: Insufficient documentation

## 2023-09-15 DIAGNOSIS — F419 Anxiety disorder, unspecified: Secondary | ICD-10-CM | POA: Insufficient documentation

## 2023-09-15 DIAGNOSIS — N182 Chronic kidney disease, stage 2 (mild): Secondary | ICD-10-CM | POA: Insufficient documentation

## 2023-09-15 DIAGNOSIS — E1165 Type 2 diabetes mellitus with hyperglycemia: Secondary | ICD-10-CM | POA: Insufficient documentation

## 2023-09-15 DIAGNOSIS — I10 Essential (primary) hypertension: Secondary | ICD-10-CM | POA: Insufficient documentation

## 2023-09-15 LAB — URINALYSIS, MACROSCOPIC
BILIRUBIN: NEGATIVE mg/dL
BLOOD: NEGATIVE mg/dL
GLUCOSE: >1000 mg/dL — AB
KETONES: NEGATIVE mg/dL
LEUKOCYTES: NEGATIVE WBCs/uL
NITRITE: NEGATIVE
PH: 5.5 (ref 5.0–9.0)
PROTEIN: NEGATIVE mg/dL
SPECIFIC GRAVITY: 1.036 — ABNORMAL HIGH (ref 1.002–1.030)
UROBILINOGEN: NORMAL mg/dL

## 2023-09-15 LAB — URINALYSIS, MICROSCOPIC
NON-SQUAMOUS EPITHELIAL CELLS URINE: <1 /hpf (ref <=1)
RBCS: 1 /hpf (ref <4)
SQUAMOUS EPITHELIAL: 1 /hpf (ref <28)
WBCS: 3 /hpf (ref <6)

## 2023-09-15 MED ORDER — TRIAMCINOLONE ACETONIDE 0.1 % TOPICAL CREAM
TOPICAL_CREAM | Freq: Two times a day (BID) | CUTANEOUS | 1 refills | Status: AC
Start: 2023-09-15 — End: 2023-09-22

## 2023-09-15 MED ORDER — CYANOCOBALAMIN (VIT B-12) 1,000 MCG/ML INJECTION SOLUTION
1000.0000 ug | INTRAMUSCULAR | Status: AC
Start: 2023-09-15 — End: 2023-09-15
  Administered 2023-09-15: 1000 ug via SUBCUTANEOUS

## 2023-09-15 MED ORDER — KETOCONAZOLE 2 % TOPICAL CREAM
TOPICAL_CREAM | Freq: Every day | CUTANEOUS | 2 refills | Status: AC
Start: 2023-09-15 — End: 2023-09-29

## 2023-09-15 MED ORDER — DIAZEPAM 5 MG TABLET
5.0000 mg | ORAL_TABLET | Freq: Every day | ORAL | 2 refills | Status: DC
Start: 2023-09-15 — End: 2023-10-13

## 2023-09-15 NOTE — Nursing Note (Signed)
Patient is here for 31m CDM

## 2023-09-15 NOTE — Assessment & Plan Note (Signed)
Kidney function stable at this time  Continue blood sugar and blood pressure control

## 2023-09-15 NOTE — Assessment & Plan Note (Signed)
Continue Effexor 37.5 mg daily

## 2023-09-15 NOTE — Telephone Encounter (Signed)
Patient is here for routine appt for medication refills.

## 2023-09-15 NOTE — Assessment & Plan Note (Signed)
Blood pressure stable with current medications.

## 2023-09-15 NOTE — Assessment & Plan Note (Signed)
Patient referred to new neurologist with appointment in October scheduled  Question whether Amaryl to be causing some of her issues so suggest cessation of medication and increase of long-acting insulin by 5 units nightly  Patient to avoid sudden position changes as discussed

## 2023-09-15 NOTE — Progress Notes (Signed)
INTER[facility]ICINE[facility]  [address]  [place] [address]          Follow Up        Reason for Visit: Follow Up (Routine CDM) B12 shot; rash on left side; still has dizzy issues    History of Present Illness  Grace Hoffman is a 72 y.o. female who is being seen today in office for in the office for f/u as well as complaints of rash/skin lesion areas left side just under her breast.  States she has noted these for several weeks and actually mentioned to the physician at the hospital when she was admitted recently who thought it was scarring from shingles.  Patient states they look circular little darker discoloration than her normal skin occasionally did itch her but no blistering or pain noted.    Continued issues with dizziness reported with a history of  blood pressure/hypertension issues noted.  Currently her blood pressure is stable at 120 2/74.  She is currently taking Vasotec 10 mg daily as well as hydrochlorothiazide 25 mg daily p.r.n.Marland Kitchen   Denies chest pain.  She has been seen by neurologist in the past with MRI of her brain noting signs of TIAs.  She has also used physical therapy for the dizziness and gait issues which seemed to help.  Previous neurologist did change coverage for her insurance so she requested a referral to different neurologist with appointment coming up in October.  She states she notices the dizziness is worse if her sugar is low so questioning if possibly hypoglycemia could be related.  She also has a history of Occasional headaches noted as well as misalignment of ear Crystal's and has been seen by the ENT where she had her crystals realigned.       F/U BS w history of uncontrolled diabetes with last hemoglobin 8.3 which was down from previous A1c 9.1.  Currently patient is on metformin at 1000 mg twice daily along with glimepiride 4 mg twice daily Jardiance 25 mg daily Toujeo 30 units nightly and Humalog 5 units with meals.  Noted she was unable to tolerate being  injectables such as Ozempic and Mounjaro due to GI side effects.  She has a history of poor compliance noted but states her sugar has been doing better at home when she checks it.  Denies increased thirst or urination.  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  She does not have any kind of burning pressure pain that has had infections in the past without symptoms.     Follow-up B12 deficiency with chronic fatigue issues noted.  Patient is requesting B12 injection today which do seem to help with her energy level.  Last B12 level when checked in lab stable however.     Follow-up chronic pain/DJD/fibromyalgia for which she has been on chronic pain therapy/narcotics for numerous years.  Patient had reported increased pain in her lower back with x-rays showing moderate DJD the MRI of spine ordered.  She did have an MRI was referred to [facility] neurosurgeon who she states she has an appointment on December 26 to obtain injections in her back area.  She also is currently taking Norco 7.5 mg q.6 hours p.r.n. which does help with her chronic pain issues.  She denies any side effects to medication and compliant w urine drug screen.  Requesting medication refill.  Follow-up anxiety/depression with patient currently on Valium 5 mg daily p.r.n. as well as Effexor 37.5 mg daily.  Patient denies panic attack and states anxiety depression overall stable at this current time.    Would like to wait on flu shot until October    PHQ Questionnaire       Past Medical History:   Diagnosis Date    Anxiety     Chronic back pain     COVID-19 vaccine series completed     Depression     Diabetes mellitus, type 2 (CMS HCC)     Fibromyalgia affecting multiple sites     Hypertension     Hypomagnesemia     Insomnia disorder     Migraine     Mixed hyperlipidemia     Orthostatic hypotension     Osteoarthritis     TIA (transient ischemic  attack)     Unspecified glaucoma(365.9)     Vitamin D deficiency          Past Surgical History:   Procedure Laterality Date    HX CHOLECYSTECTOMY      HX KNEE REPLACMENT Left     HX ROTATOR CUFF REPAIR Left     SKIN CANCER EXCISION      forehead removed      Family Medical History:       Problem Relation (Age of Onset)    Kidney failure Mother    No Known Problems Sister, Brother, Half-Sister, Half-Brother, Maternal Aunt, Maternal Uncle, Paternal Aunt, Paternal Uncle, Maternal Grandmother, Maternal Grandfather, Paternal Grandmother, Paternal Grandfather, Daughter, Son    Respiratory Problems Father            Social History     Tobacco Use    Smoking status: Never    Smokeless tobacco: Never   Vaping Use    Vaping status: Never Used   Substance Use Topics    Alcohol use: Never    Drug use: Never     Medication:  d-mannose 500 mg Oral Capsule, Take 1 Capsule (500 mg total) by mouth Twice daily  diclofenac sodium (PENNSAID) 20 mg/gram /actuation(2 %) solution in metered-dose pump, Apply 1 Application  topically Twice daily  empagliflozin (JARDIANCE) 25 mg Oral Tablet, Take 1 Tablet (25 mg total) by mouth Once a day  enalapril maleate (VASOTEC) 10 mg Oral Tablet, Take 1 Tablet (10 mg total) by mouth Once a day for 180 days  ergocalciferol, vitamin D2, (DRISDOL) 1,250 mcg (50,000 unit) Oral Capsule, Take 1 Capsule (50,000 Units total) by mouth Every 7 days (Patient taking differently: Take 1 Capsule (50,000 Units total) by mouth Every 7 days On Monday)  glimepiride (AMARYL) 4 mg Oral Tablet, TAKE 1 TABLET(4 MG) BY MOUTH TWICE DAILY WITH A MEAL FOR DIABETES  HYDROcodone-acetaminophen (NORCO) 7.5-325 mg Oral Tablet, Take 1 Tablet by mouth Every 8 hours as needed for up to 30 days Indications: pain  insulin glargine U-300 conc (TOUJEO MAX U-300 SOLOSTAR) 300 unit/mL (3 mL) Subcutaneous Insulin Pen, Inject 30 Units under the skin Every night  insulin lispro (HUMALOG KWIKPEN INSULIN) 100 unit/mL Subcutaneous Insulin Pen,  Use as directed sliding scale; also use 5 units with meals Indications: type 2 diabetes mellitus (Patient taking differently: Use as directed sliding scale; also use up to 5 units with meals Indications: type 2 diabetes mellitus)  latanoprost (XALATAN) 0.005 % Ophthalmic Drops, Administer 1 Drop into the left eye Every night  Levetiracetam 500 mg Oral Tablet Sustained Release 24 hr, Take 1  Tablet (500 mg total) by mouth Every night for 90 days  loratadine (CLARITIN) 10 mg Oral Tablet, Take 1 Tablet (10 mg total) by mouth Once per day as needed  magnesium oxide (MAG-OX) 400 mg Oral Tablet, Take 2 Tablets (800 mg total) by mouth Once a day  MetFORMIN (GLUCOPHAGE) 1,000 mg Oral Tablet, Take 1 Tablet (1,000 mg total) by mouth Twice daily with food  rosuvastatin (CRESTOR) 20 mg Oral Tablet, Take 1 Tablet (20 mg total) by mouth Every evening  solifenacin (VESICARE) 5 mg Oral Tablet, Take 1 Tablet (5 mg total) by mouth Once a day  SUMAtriptan (IMITREX) 50 mg Oral Tablet, Take 1 Tablet (50 mg total) by mouth Once, as needed for Migraine TAKE 1 TABLET BY MOUTH AT ONSET OF HEADACHE. IF NO RELIEF MAY REPEAT 1 AFTER AT LEAST 2 HOURS. MAX OF 4 TABS PER 24 HOURS  venlafaxine (EFFEXOR XR) 37.5 mg Oral Capsule, Sust. Release 24 hr, Take 1 Capsule (37.5 mg total) by mouth Every night  diazePAM (VALIUM) 5 mg Oral Tablet, Take 1 Tablet (5 mg total) by mouth Once a day    No facility-administered medications prior to visit.    Allergies:  Allergies   Allergen Reactions    Codeine Nausea/ Vomiting    Amoxicillin Diarrhea    Macrobid [Nitrofurantoin Monohyd/M-Cryst] Nausea/ Vomiting       Physical Exam:  Vitals:    09/15/23 1151   BP: 122/74   Pulse: 90   SpO2: 96%   Weight: 88.9 kg (196 lb)      Patient overweight  General appearance: alert, cooperative, in no acute distress  HEENT: Ears: clear, no effusion, no erythema; Throat: non-erythematous; no LAD, neck supple, moist mucus membranes  Lungs: clear to auscultation bilaterally; no  wheezes or rhonchi   Heart: regular rate and rhythm; normal S1 & S2; no murmur  Abdomen: soft, non-tender, not distended; bowel sounds present  Extremities: extremities normal ROM, no cyanosis or edema ,   Tinea versicolor lesions left lower rib area just under breast noted x3 proximally size of a quarter each   Chronic trigger points back chest thighs due to fibromyalgia  Arthritic changes hands knees bilateral  Decreased muscle tone all extremities  Gait stable  Psych: alert and oriented x 3  Neuro: CN 2-12 grossly intact    Assessment & Plan  Tinea versicolor  Will start Nizoral cream b.i.d. x2 weeks  If itching noted made mixed with the steroid cream b.i.d. as directed  Dizziness  Patient referred to new neurologist with appointment in October scheduled  Question whether Amaryl to be causing some of her issues so suggest cessation of medication and increase of long-acting insulin by 5 units nightly  Patient to avoid sudden position changes as discussed  Primary hypertension  Blood pressure stable with current medications  Uncontrolled type 2 diabetes mellitus with hyperglycemia (CMS HCC)  Continue blood sugar monitoring outpatient along with diet  Once again will try cessation of Amaryl to see if dizziness improves  Encouraged to increase long-acting insulin to 35 units nightly in use mealtime short-acting insulin sliding scale as directed  Chronic kidney disease (CKD), stage II (mild)  Kidney function stable at this time  Continue blood sugar and blood pressure control  Recurrent UTI  Urinalysis obtained today  B12 deficiency  B12 injection given  Chronic low back pain, unspecified back pain laterality, unspecified whether sciatica present  Continue chronic pain management with refills of Norco given today  Anxiety  Continue Valium 5 mg daily  Depression, unspecified depression type  Continue Effexor 37.5 mg daily     Orders Placed This Encounter    URINALYSIS, MACROSCOPIC AND MICROSCOPIC W/CULTURE REFLEX     diazePAM (VALIUM) 5 mg Oral Tablet    cyanocobalamin (VITAMIN B12) 1000 mcg/mL injection    ketoconazole (NIZORAL) 2 % Cream    triamcinolone acetonide 0.1 % Cream         Patient will obtain flu shot in October    Importance of medication adherence discussed.    Importance of medication adherence discussed.           Follow up:   Post-Discharge Follow Up Appointments       Friday Oct 09, 2023    Return Patient Visit with Arlana Hove, DO at  2:00 PM      Tuesday Oct 13, 2023    Return Patient Visit with Rosalio Loud at 11:30 AM      Friday Oct 16, 2023    New Patient Visit with Dyke Brackett, DO at  2:40 PM      Monday Apr 25, 2024    Return Telephone Visit with Eyvonne Mechanic, PA-C at  1:30 PM      Internal Medicine, Building [facility]  [facility]  [address]  [place] [address]  [phone] Neurology, [facility]  [facility] [place]  [address]  [place] [address]  [phone] Urology, [facility] [place], [place]  [address]  [place] [address]  [phone]           Seek medical attention for new or worsening symptoms.  Patient has been seen in this clinic within the last 3 years.     Amy Hyman Bible, PA-C        I personally reviewed the documentation of care provided by the certified physician assistant and I agree with her medical decision making, Weyman Pedro, D.O.     This note was partially created using MModal Fluency Direct system (voice recognition software) and is inherently subject to errors including those of syntax and "sound-alike" substitutions which may escape proofreading.  In such instances, original meaning may be extrapolated by contextual derivation.

## 2023-09-15 NOTE — Assessment & Plan Note (Signed)
Continue chronic pain management with refills of Norco given today

## 2023-09-15 NOTE — Assessment & Plan Note (Signed)
Continue blood sugar monitoring outpatient along with diet  Once again will try cessation of Amaryl to see if dizziness improves  Encouraged to increase long-acting insulin to 35 units nightly in use mealtime short-acting insulin sliding scale as directed

## 2023-09-15 NOTE — Assessment & Plan Note (Signed)
Continue Valium 5 mg daily

## 2023-09-15 NOTE — Assessment & Plan Note (Signed)
Urinalysis obtained today.

## 2023-09-15 NOTE — Assessment & Plan Note (Signed)
B12 injection given

## 2023-09-16 ENCOUNTER — Telehealth (INDEPENDENT_AMBULATORY_CARE_PROVIDER_SITE_OTHER): Payer: Self-pay | Admitting: Physician Assistant

## 2023-09-16 MED ORDER — HYDROCODONE 7.5 MG-ACETAMINOPHEN 325 MG TABLET
1.0000 | ORAL_TABLET | Freq: Three times a day (TID) | ORAL | 0 refills | Status: DC | PRN
Start: 2023-09-16 — End: 2023-10-13

## 2023-09-16 NOTE — Telephone Encounter (Signed)
-----   Message from Eyvonne Mechanic sent at 09/16/2023  9:46 AM EDT -----  No infection noted

## 2023-10-09 ENCOUNTER — Encounter (INDEPENDENT_AMBULATORY_CARE_PROVIDER_SITE_OTHER): Payer: Self-pay | Admitting: Student in an Organized Health Care Education/Training Program

## 2023-10-13 ENCOUNTER — Encounter (INDEPENDENT_AMBULATORY_CARE_PROVIDER_SITE_OTHER): Payer: Self-pay | Admitting: Physician Assistant

## 2023-10-13 ENCOUNTER — Ambulatory Visit: Payer: Medicare Other | Attending: Physician Assistant | Admitting: Physician Assistant

## 2023-10-13 ENCOUNTER — Other Ambulatory Visit: Payer: Self-pay

## 2023-10-13 ENCOUNTER — Other Ambulatory Visit (INDEPENDENT_AMBULATORY_CARE_PROVIDER_SITE_OTHER): Payer: Self-pay | Admitting: Physician Assistant

## 2023-10-13 VITALS — BP 132/72 | HR 86 | Ht 65.0 in

## 2023-10-13 DIAGNOSIS — I10 Essential (primary) hypertension: Secondary | ICD-10-CM | POA: Insufficient documentation

## 2023-10-13 DIAGNOSIS — E785 Hyperlipidemia, unspecified: Secondary | ICD-10-CM | POA: Insufficient documentation

## 2023-10-13 DIAGNOSIS — N182 Chronic kidney disease, stage 2 (mild): Secondary | ICD-10-CM | POA: Insufficient documentation

## 2023-10-13 DIAGNOSIS — E538 Deficiency of other specified B group vitamins: Secondary | ICD-10-CM | POA: Insufficient documentation

## 2023-10-13 DIAGNOSIS — G894 Chronic pain syndrome: Secondary | ICD-10-CM | POA: Insufficient documentation

## 2023-10-13 DIAGNOSIS — Z79899 Other long term (current) drug therapy: Secondary | ICD-10-CM | POA: Insufficient documentation

## 2023-10-13 DIAGNOSIS — F419 Anxiety disorder, unspecified: Secondary | ICD-10-CM | POA: Insufficient documentation

## 2023-10-13 DIAGNOSIS — E1165 Type 2 diabetes mellitus with hyperglycemia: Secondary | ICD-10-CM | POA: Insufficient documentation

## 2023-10-13 DIAGNOSIS — R42 Dizziness and giddiness: Secondary | ICD-10-CM | POA: Insufficient documentation

## 2023-10-13 LAB — CBC WITH DIFF
BASOPHIL #: 0.1 10*3/uL (ref 0.00–0.10)
BASOPHIL %: 1 % (ref 0–1)
EOSINOPHIL #: 0.2 10*3/uL (ref 0.00–0.50)
EOSINOPHIL %: 3 % (ref 1–7)
HCT: 43.2 % — ABNORMAL HIGH (ref 31.2–41.9)
HGB: 14.3 g/dL (ref 10.9–14.3)
LYMPHOCYTE #: 2.1 10*3/uL (ref 1.00–3.00)
LYMPHOCYTE %: 29 % (ref 16–44)
MCH: 33.2 pg — ABNORMAL HIGH (ref 24.7–32.8)
MCHC: 33.1 g/dL (ref 32.3–35.6)
MCV: 100.2 fL — ABNORMAL HIGH (ref 75.5–95.3)
MONOCYTE #: 0.5 10*3/uL (ref 0.30–1.00)
MONOCYTE %: 7 % (ref 5–13)
MPV: 10.9 fL — ABNORMAL HIGH (ref 7.9–10.8)
NEUTROPHIL #: 4.4 10*3/uL (ref 1.85–7.80)
NEUTROPHIL %: 60 % (ref 43–77)
PLATELETS: 217 10*3/uL (ref 140–440)
RBC: 4.31 10*6/uL (ref 3.63–4.92)
RDW: 13.9 % (ref 12.3–17.7)
WBC: 7.3 10*3/uL (ref 3.8–11.8)

## 2023-10-13 LAB — LIPID PANEL
CHOL/HDL RATIO: 3.1
CHOLESTEROL: 148 mg/dL (ref <200)
HDL CHOL: 48 mg/dL (ref >=40)
LDL CALC: 70 mg/dL (ref 0–100)
TRIGLYCERIDES: 149 mg/dL (ref <=150)
VLDL CALC: 30 mg/dL (ref 0–50)

## 2023-10-13 LAB — URINALYSIS, MACROSCOPIC
BILIRUBIN: NEGATIVE mg/dL
BLOOD: NEGATIVE mg/dL
GLUCOSE: >1000 mg/dL — AB
KETONES: NEGATIVE mg/dL
LEUKOCYTES: NEGATIVE WBCs/uL
NITRITE: NEGATIVE
PH: 5 (ref 5.0–9.0)
PROTEIN: NEGATIVE mg/dL
SPECIFIC GRAVITY: 1.034 — ABNORMAL HIGH (ref 1.002–1.030)
UROBILINOGEN: NORMAL mg/dL

## 2023-10-13 LAB — MICROALBUMIN/CREATININE RATIO, URINE, RANDOM
CREATININE RANDOM URINE: 66 mg/dL (ref 30–125)
MICROALBUMIN RANDOM URINE: <0.7 mg/dL

## 2023-10-13 LAB — DRUG SCREEN, NO CONFIRMATION, URINE
AMPHETAMINES URINE: NEGATIVE
BARBITURATES URINE: NEGATIVE
BENZODIAZEPINES URINE: POSITIVE — AB
BUPRENORPHINE URINE: NEGATIVE
CANNABINOIDS URINE: NEGATIVE
COCAINE METABOLITES URINE: NEGATIVE
FENTANYL, URINE: NEGATIVE
METHADONE URINE: NEGATIVE
OPIATES URINE: POSITIVE — AB
OXYCODONE URINE: NEGATIVE
PCP URINE: NEGATIVE

## 2023-10-13 LAB — URINALYSIS, MICROSCOPIC
RBCS: 2 /[HPF] (ref <4)
SQUAMOUS EPITHELIAL: 1 /[HPF] (ref <28)
WBCS: 1 /[HPF] (ref <6)

## 2023-10-13 LAB — COMPREHENSIVE METABOLIC PNL, FASTING
ALBUMIN/GLOBULIN RATIO: 1.2 (ref 0.8–1.4)
ALBUMIN: 4.1 g/dL (ref 3.5–5.7)
ALKALINE PHOSPHATASE: 65 U/L (ref 34–104)
ALT (SGPT): 13 U/L (ref 7–52)
ANION GAP: 9 mmol/L (ref 4–13)
AST (SGOT): 16 U/L (ref 13–39)
BILIRUBIN TOTAL: 0.5 mg/dL (ref 0.3–1.0)
BUN/CREA RATIO: 17 (ref 6–22)
BUN: 18 mg/dL (ref 7–25)
CALCIUM, CORRECTED: 9.4 mg/dL (ref 8.9–10.8)
CALCIUM: 9.5 mg/dL (ref 8.6–10.3)
CHLORIDE: 103 mmol/L (ref 98–107)
CO2 TOTAL: 27 mmol/L (ref 21–31)
CREATININE: 1.08 mg/dL (ref 0.60–1.30)
ESTIMATED GFR: 54 mL/min/{1.73_m2} — ABNORMAL LOW (ref >59)
GLOBULIN: 3.4 (ref 2.9–5.4)
GLUCOSE: 272 mg/dL — ABNORMAL HIGH (ref 74–109)
OSMOLALITY, CALCULATED: 289 mosm/kg (ref 270–290)
POTASSIUM: 4.4 mmol/L (ref 3.5–5.1)
PROTEIN TOTAL: 7.5 g/dL (ref 6.4–8.9)
SODIUM: 139 mmol/L (ref 136–145)

## 2023-10-13 LAB — MAGNESIUM: MAGNESIUM: 1.7 mg/dL — ABNORMAL LOW (ref 1.9–2.7)

## 2023-10-13 MED ORDER — DIAZEPAM 5 MG TABLET
5.0000 mg | ORAL_TABLET | Freq: Two times a day (BID) | ORAL | 0 refills | Status: DC | PRN
Start: 2023-10-13 — End: 2023-11-11

## 2023-10-13 MED ORDER — INSULIN GLARGINE (U-300) CONC. 300 UNIT/ML (3 ML) SUBCUTANEOUS PEN: 35.0000 [IU] | PEN_INJECTOR | Freq: Every evening | SUBCUTANEOUS | 2 refills | Status: DC

## 2023-10-13 MED ORDER — CYANOCOBALAMIN (VIT B-12) 1,000 MCG/ML INJECTION SOLUTION
1000.0000 ug | INTRAMUSCULAR | Status: AC
Start: 2023-10-13 — End: 2023-10-13
  Administered 2023-10-13: 1000 ug via SUBCUTANEOUS

## 2023-10-13 NOTE — Assessment & Plan Note (Signed)
Patient to reschedule her appointment with neurologist this Friday  Once again medication change suggested as noted concerning her Amaryl to see if related to dizzy symptoms

## 2023-10-13 NOTE — Assessment & Plan Note (Signed)
On statin therapy 

## 2023-10-13 NOTE — Assessment & Plan Note (Signed)
UDS obtained

## 2023-10-13 NOTE — Assessment & Plan Note (Signed)
B12 injection given

## 2023-10-13 NOTE — Assessment & Plan Note (Signed)
Continue current pain management  Refills for Norco given

## 2023-10-13 NOTE — Assessment & Plan Note (Signed)
Blood pressure stable at this time.  Continue current medications.

## 2023-10-13 NOTE — Nursing Note (Signed)
Patient is here for routine follow up for medication refills.

## 2023-10-13 NOTE — Assessment & Plan Note (Signed)
Labs obtained as well as urine in eval of kidney function

## 2023-10-13 NOTE — Progress Notes (Signed)
INTERNAL MEDICINE, BU[facility]ING [facility]  [address]  [place] [address]          Follow Up        Reason for Visit: Follow Up (Routine 1 month ) labs B12 shot    History of Present Illness  Grace Hoffman is a 72 y.o. female who is being seen today in office for f/u concerning blood pressure/hypertension with chronic dizziness noted.  Previous visit we discussed possibly her Amaryl causing the symptoms and encouraged her to stop medication increase her long-acting insulin however patient did not follow instructions given so continues on the same medications.  She has shown no signs of the dizziness been related to her blood pressure  and continues to take Vasotec 10 mg daily as well as hydrochlorothiazide 25 mg daily p.r.n.Marland Kitchen  Day blood pressure stable at 130 2/72.  Denies chest pain.  She has been seen by neurologist in the past with MRI of her brain noting signs of TIAs.  She has also used physical therapy for the dizziness and gait issues which seemed to help.  Previous neurologist did change coverage for her insurance so she requested a referral to different neurologist with appointment scheduled for this Friday she tells me today that she can not make the appointment because her husband has an appointment in [place].  I encouraged her to call and reschedule it because she knows shows they will not see her again.  She once again had stated she notices the dizziness is worse if her sugar is low so thought possibly hypoglycemia could be related.  She also has a history of Occasional headaches noted as well as misalignment of ear Crystal's and has been seen by the ENT where she had her crystals realigned.       F/U BS w history of uncontrolled diabetes with last hemoglobin 8.6. Currently patient is on metformin at 1000 mg twice daily along with glimepiride 4 mg twice daily Jardiance 25 mg daily Toujeo 30 units nightly and Humalog 5 units with meals.  Noted she was unable to tolerate being injectables such as  Ozempic and Mounjaro due to GI side effects.  She has a history of poor compliance noted but states her sugar has been doing better at home when she checks it.  Denies increased thirst or urination.  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      Follow-up Cholesterol/lipids for which she is currently on Crestor 20 mg nightly.  At this time she has been side effects or issues with medication and follow-up labs needed.     Follow-up B12 deficiency with chronic fatigue issues noted.  Patient is requesting B12 injection today which do seem to help with her energy level.  Last B12 level when checked in lab stable however.     Follow-up chronic pain/DJD/fibromyalgia for which she has been on chronic pain therapy/narcotics for numerous years.  Patient had reported increased pain in her lower back with x-rays showing moderate DJD the MRI of spine ordered.  She did have an MRI was referred to [facility] neurosurgeon who she states she has an appointment on December 26 to obtain injections in her back area.  She also is currently taking Norco 7.5 mg q.6 hours p.r.n. which does help with her chronic pain issues.  She denies any side effects to medication and compliant w urine drug screen.  Requesting medication refill.  Follow-up anxiety/depression with patient currently on Valium 5 mg b.i.d. p.r.n. as well as Effexor 37.5 mg daily.  She states currently her valium was sent last time for daily use and upon looking back through the chart she was discharged from rehab with instructions to use daily thus it seems this is how the medication error occurred.  She is requesting to go back to b.i.d. and needs refills today.  Patient denies panic attack and states anxiety depression overall stable at this current time.    Flu shot up to date       PHQ Questionnaire           Past Medical History:   Diagnosis Date    Anxiety     Chronic back pain     COVID-19  vaccine series completed     Depression     Diabetes mellitus, type 2 (CMS HCC)     Fibromyalgia affecting multiple sites     Hypertension     Hypomagnesemia     Insomnia disorder     Migraine     Mixed hyperlipidemia     Orthostatic hypotension     Osteoarthritis     TIA (transient ischemic attack)     Unspecified glaucoma(365.9)     Vitamin D deficiency          Past Surgical History:   Procedure Laterality Date    HX CHOLECYSTECTOMY      HX KNEE REPLACMENT Left     HX ROTATOR CUFF REPAIR Left     SKIN CANCER EXCISION      forehead removed      Family Medical History:       Problem Relation (Age of Onset)    Kidney failure Mother    No Known Problems Sister, Brother, Half-Sister, Half-Brother, Maternal Aunt, Maternal Uncle, Paternal Aunt, Paternal Uncle, Maternal Grandmother, Maternal Grandfather, Paternal Grandmother, Paternal Grandfather, Daughter, Son    Respiratory Problems Father            Social History     Tobacco Use    Smoking status: Never    Smokeless tobacco: Never   Vaping Use    Vaping status: Never Used   Substance Use Topics    Alcohol use: Never    Drug use: Never     Medication:  d-mannose 500 mg Oral Capsule, Take 1 Capsule (500 mg total) by mouth Twice daily  diclofenac sodium (PENNSAID) 20 mg/gram /actuation(2 %) solution in metered-dose pump, Apply 1 Application  topically Twice daily  empagliflozin (JARDIANCE) 25 mg Oral Tablet, Take 1 Tablet (25 mg total) by mouth Once a day  enalapril maleate (VASOTEC) 10 mg Oral Tablet, Take 1 Tablet (10 mg total) by mouth Once a day for 180 days  ergocalciferol, vitamin D2, (DRISDOL) 1,250 mcg (50,000 unit) Oral Capsule, Take 1 Capsule (50,000 Units total) by mouth Every 7 days (Patient taking differently: Take 1 Capsule (50,000 Units total) by mouth Every 7 days On Monday)  HYDROcodone-acetaminophen (NORCO) 7.5-325 mg Oral Tablet, Take 1 Tablet by mouth Every 8 hours as needed for up to 30 days Indications: pain  insulin lispro (HUMALOG KWIKPEN  INSULIN) 100 unit/mL Subcutaneous Insulin Pen, Use as directed sliding scale; also use 5 units with meals Indications: type 2 diabetes mellitus (Patient taking differently: Use as directed sliding scale; also use up to 5 units with meals Indications: type 2 diabetes mellitus)  latanoprost (XALATAN) 0.005 % Ophthalmic Drops, Administer 1 Drop into the left eye Every night  Levetiracetam 500 mg Oral Tablet Sustained Release 24 hr, Take 1 Tablet (500 mg total) by mouth Every night for 90 days  loratadine (CLARITIN) 10 mg Oral Tablet, Take 1 Tablet (10 mg total) by mouth Once per day as needed  magnesium oxide (MAG-OX) 400 mg Oral Tablet, Take 2 Tablets (800 mg total) by mouth Once a day  MetFORMIN (GLUCOPHAGE) 1,000 mg Oral Tablet, Take 1 Tablet (1,000 mg total) by mouth Twice daily with food  rosuvastatin (CRESTOR) 20 mg Oral Tablet, Take 1 Tablet (20 mg total) by mouth Every evening  solifenacin (VESICARE) 5 mg Oral Tablet, Take 1 Tablet (5 mg total) by mouth Once a day  SUMAtriptan (IMITREX) 50 mg Oral Tablet, Take 1 Tablet (50 mg total) by mouth Once, as needed for Migraine TAKE 1 TABLET BY MOUTH AT ONSET OF HEADACHE. IF NO RELIEF MAY REPEAT 1 AFTER AT LEAST 2 HOURS. MAX OF 4 TABS PER 24 HOURS  venlafaxine (EFFEXOR XR) 37.5 mg Oral Capsule, Sust. Release 24 hr, Take 1 Capsule (37.5 mg total) by mouth Every night  diazePAM (VALIUM) 5 mg Oral Tablet, Take 1 Tablet (5 mg total) by mouth Once a day  insulin glargine U-300 conc (TOUJEO MAX U-300 SOLOSTAR) 300 unit/mL (3 mL) Subcutaneous Insulin Pen, Inject 30 Units under the skin Every night    No facility-administered medications prior to visit.    Allergies:  Allergies   Allergen Reactions    Codeine Nausea/ Vomiting    Amoxicillin Diarrhea    Macrobid [Nitrofurantoin Monohyd/M-Cryst] Nausea/ Vomiting       Physical Exam:  Vitals:    10/13/23 1227   BP: 132/72   Pulse: 86   SpO2: 97%   Height: 1.651 m (5\' 5" )        Patient overweight  General appearance: alert,  cooperative, in no acute distress  HEENT: Ears: clear, no effusion, no erythema; Throat: non-erythematous; no LAD, neck supple, moist mucus membranes  Lungs: clear to auscultation bilaterally; no wheezes or rhonchi   Heart: regular rate and rhythm; normal S1 & S2; no murmur  Abdomen: soft, non-tender, not distended; bowel sounds present  Obese abd  Extremities: extremities normal ROM, no cyanosis or edema ,   Chronic trigger points back chest thighs due to fibromyalgia  Arthritic changes hands knees bilateral  Decreased muscle tone all extremities  Gait stable  Psych: alert and oriented x 3  Neuro: CN 2-12 grossly intact    Assessment & Plan  Primary hypertension  Blood pressure stable at this time  Continue current medications  Uncontrolled type 2 diabetes mellitus with hyperglycemia (CMS HCC)  Labs obtained  Once again discuss cessation of Amaryl to see if related to her dizzy symptoms  Will increase Tresiba 35 units nightly  Samples of Jardiance 25 mg given today  Continue strict diet blood sugar monitoring  Chronic kidney disease (CKD), stage II (mild)  Labs obtained as well as urine in eval of kidney function  Hyperlipidemia, unspecified hyperlipidemia type  On statin therapy  B12 deficiency  B12 injection given  Chronic pain syndrome  Continue current pain management  Refills for Norco given  Anxiety  Valium increase to 5 mg b.i.d. as previously noted-refills given  Dizziness  Patient to reschedule her appointment with neurologist this Friday  Once again medication change suggested as noted concerning her Amaryl to see if related to dizzy symptoms  Long-term use of high-risk medication  UDS obtained     Orders Placed This Encounter  DRUG SCREEN, NO CONFIRMATION, URINE    CBC/DIFF    COMPREHENSIVE METABOLIC PNL, FASTING    HGA1C (HEMOGLOBIN A1C WITH EST AVG GLUCOSE)    URINALYSIS, MACROSCOPIC AND MICROSCOPIC W/CULTURE REFLEX    LIPID PANEL    MICROALBUMIN/CREATININE RATIO, URINE, RANDOM    MAGNESIUM    CBC  WITH DIFF    URINALYSIS, MACROSCOPIC    URINALYSIS, MICROSCOPIC    cyanocobalamin (VITAMIN B12) 1000 mcg/mL injection    insulin glargine U-300 conc (TOUJEO MAX U-300 SOLOSTAR) 300 unit/mL (3 mL) Subcutaneous Insulin Pen    diazePAM (VALIUM) 5 mg Oral Tablet             Follow up:   Post-Discharge Follow Up Appointments       Friday Oct 16, 2023    New Patient Visit with Dyke Brackett, DO at  2:40 PM      Wednesday Nov 11, 2023    Return Patient Visit with Eyvonne Mechanic, PA-C at 10:45 AM      Friday Dec 11, 2023    Return Patient Visit with Eyvonne Mechanic, PA-C at  9:30 AM      Monday Apr 25, 2024    Return Telephone Visit with Eyvonne Mechanic, PA-C at  1:30 PM      Internal Medicine, Building A  [facility], [place]  [address]  [place] [address]  [phone] Neurology, [facility]  [facility] [place]  [address]  [place] [address]  [phone]           Seek medical attention for new or worsening symptoms.  Patient has been seen in this clinic within the last 3 years.     Amy Hyman Bible, PA-C      I personally reviewed the documentation of care provided by the certified physician assistant and I agree with her medical decision making, Weyman Pedro, D.O.       This note was partially created using MModal Fluency Direct system (voice recognition software) and is inherently subject to errors including those of syntax and "sound-alike" substitutions which may escape proofreading.  In such instances, original meaning may be extrapolated by contextual derivation.

## 2023-10-13 NOTE — Assessment & Plan Note (Signed)
Labs obtained  Once again discuss cessation of Amaryl to see if related to her dizzy symptoms  Will increase Tresiba 35 units nightly  Samples of Jardiance 25 mg given today  Continue strict diet blood sugar monitoring

## 2023-10-13 NOTE — Telephone Encounter (Signed)
Patient is in office for routine 1 month follow up for medication refills.

## 2023-10-13 NOTE — Assessment & Plan Note (Signed)
Valium increase to 5 mg b.i.d. as previously noted-refills given

## 2023-10-14 ENCOUNTER — Telehealth (INDEPENDENT_AMBULATORY_CARE_PROVIDER_SITE_OTHER): Payer: Self-pay | Admitting: Physician Assistant

## 2023-10-14 LAB — HGA1C (HEMOGLOBIN A1C WITH EST AVG GLUCOSE): HEMOGLOBIN A1C: 7.5 % — ABNORMAL HIGH (ref 4.0–6.0)

## 2023-10-14 MED ORDER — HYDROCODONE 7.5 MG-ACETAMINOPHEN 325 MG TABLET
1.0000 | ORAL_TABLET | Freq: Three times a day (TID) | ORAL | 0 refills | Status: DC | PRN
Start: 2023-10-14 — End: 2023-11-11

## 2023-10-14 NOTE — Telephone Encounter (Signed)
-----   Message from Eyvonne Mechanic sent at 10/14/2023  2:53 PM EDT -----  If only 1 daily go bid

## 2023-10-15 ENCOUNTER — Telehealth (INDEPENDENT_AMBULATORY_CARE_PROVIDER_SITE_OTHER): Payer: Self-pay | Admitting: Physician Assistant

## 2023-10-15 NOTE — Telephone Encounter (Signed)
-----   Message from Eyvonne Mechanic sent at 10/14/2023  2:53 PM EDT -----  If only 1 daily go bid

## 2023-10-16 ENCOUNTER — Ambulatory Visit (INDEPENDENT_AMBULATORY_CARE_PROVIDER_SITE_OTHER): Payer: Medicare Other | Admitting: NEUROLOGY

## 2023-10-16 NOTE — Progress Notes (Deleted)
ASSESSMENT  1.  2.    PLAN  1.  2.    Thank you for allowing me to participate in your patient's care and please do not hesitate to contact me for any questions or concerns.    Patrick North, DO  Assistant Professor of Neurology  [facility]       ==========================================================================================================================================    NAME:  Grace Hoffman  DOB:  01-Jul-1951  VISIT DATE:  10/16/2023    CC:  ***    Patient seen in consultation at the request of Dr. Marland Kitchen  History obtained from the patient and chart/records  Age of patient:  72 y.o.      HPI:   I had the pleasure of seeing your patient in neurology clinic for an outpatient consultation, who is a 72 y.o. year old female who was referred for evaluation of ***.  Please allow me to summarize the history for the record.  ***    ============================================================================================================================================  PMHx  Patient Active Problem List   Diagnosis    Orthostatic hypotension    Hypertension    Osteoarthritis    Anxiety    Depression    Uncontrolled type 2 diabetes mellitus with hyperglycemia (CMS HCC)    Hypomagnesemia    Myalgia    Long-term use of high-risk medication    Chronic pain    Dizziness    B12 deficiency    TIA (transient ischemic attack)    Recurrent UTI    Urinary incontinence    Fatigue    Renal cyst, acquired, right    DJD (degenerative joint disease), lumbar    Essential hypertension    Chronic kidney disease (CKD), stage II (mild)    Chronic back pain    Bulging of lumbar intervertebral disc    Mood disorder (CMS HCC)    COVID-19    Vitamin D deficiency    Hyperlipidemia    Diarrhea    Asymptomatic bradycardia    Generalized weakness     Past Surgical History:   Procedure Laterality Date    HX CHOLECYSTECTOMY      HX KNEE REPLACMENT Left     HX ROTATOR CUFF REPAIR Left     SKIN CANCER  EXCISION      forehead removed         Family Medical History:       Problem Relation (Age of Onset)    Kidney failure Mother    No Known Problems Sister, Brother, Half-Sister, Half-Brother, Maternal Aunt, Maternal Uncle, Paternal Aunt, Paternal Uncle, Maternal Grandmother, Maternal Grandfather, Paternal Grandmother, Paternal Grandfather, Daughter, Son    Respiratory Problems Father            Current Outpatient Medications   Medication Sig Dispense Refill    d-mannose 500 mg Oral Capsule Take 1 Capsule (500 mg total) by mouth Twice daily      diazePAM (VALIUM) 5 mg Oral Tablet Take 1 Tablet (5 mg total) by mouth Every 12 hours as needed for Anxiety 60 Tablet 0    diclofenac sodium (PENNSAID) 20 mg/gram /actuation(2 %) solution in metered-dose pump Apply 1 Application  topically Twice daily 112 g 3    empagliflozin (JARDIANCE) 25 mg Oral Tablet Take 1 Tablet (25 mg total) by mouth Once a day 90 Tablet 1    enalapril maleate (VASOTEC) 10 mg Oral Tablet Take 1 Tablet (10 mg total) by mouth Once a day for 180 days 90 Tablet 1  ergocalciferol, vitamin D2, (DRISDOL) 1,250 mcg (50,000 unit) Oral Capsule Take 1 Capsule (50,000 Units total) by mouth Every 7 days (Patient taking differently: Take 1 Capsule (50,000 Units total) by mouth Every 7 days On Monday) 12 Capsule 1    HYDROcodone-acetaminophen (NORCO) 7.5-325 mg Oral Tablet Take 1 Tablet by mouth Every 8 hours as needed for up to 30 days Indications: pain 90 Tablet 0    insulin glargine U-300 conc (TOUJEO MAX U-300 SOLOSTAR) 300 unit/mL (3 mL) Subcutaneous Insulin Pen Inject 35 Units under the skin Every night 6 mL 2    insulin lispro (HUMALOG KWIKPEN INSULIN) 100 unit/mL Subcutaneous Insulin Pen Use as directed sliding scale; also use 5 units with meals Indications: type 2 diabetes mellitus (Patient taking differently: Use as directed sliding scale; also use up to 5 units with meals Indications: type 2 diabetes mellitus) 3 mL 2    latanoprost (XALATAN) 0.005 %  Ophthalmic Drops Administer 1 Drop into the left eye Every night      Levetiracetam 500 mg Oral Tablet Sustained Release 24 hr Take 1 Tablet (500 mg total) by mouth Every night for 90 days 90 Tablet 1    loratadine (CLARITIN) 10 mg Oral Tablet Take 1 Tablet (10 mg total) by mouth Once per day as needed 90 Tablet 3    magnesium oxide (MAG-OX) 400 mg Oral Tablet Take 2 Tablets (800 mg total) by mouth Once a day 90 Tablet 1    MetFORMIN (GLUCOPHAGE) 1,000 mg Oral Tablet Take 1 Tablet (1,000 mg total) by mouth Twice daily with food 180 Tablet 1    rosuvastatin (CRESTOR) 20 mg Oral Tablet Take 1 Tablet (20 mg total) by mouth Every evening 90 Tablet 4    solifenacin (VESICARE) 5 mg Oral Tablet Take 1 Tablet (5 mg total) by mouth Once a day 90 Tablet 1    SUMAtriptan (IMITREX) 50 mg Oral Tablet Take 1 Tablet (50 mg total) by mouth Once, as needed for Migraine TAKE 1 TABLET BY MOUTH AT ONSET OF HEADACHE. IF NO RELIEF MAY REPEAT 1 AFTER AT LEAST 2 HOURS. MAX OF 4 TABS PER 24 HOURS 8 Tablet 2    venlafaxine (EFFEXOR XR) 37.5 mg Oral Capsule, Sust. Release 24 hr Take 1 Capsule (37.5 mg total) by mouth Every night 90 Capsule 1     No current facility-administered medications for this visit.     Allergies   Allergen Reactions    Codeine Nausea/ Vomiting    Amoxicillin Diarrhea    Macrobid [Nitrofurantoin Monohyd/M-Cryst] Nausea/ Vomiting     Social History     Socioeconomic History    Marital status: Married     Spouse name: Not on file    Number of children: 1    Years of education: 11    Highest education level: Not on file   Occupational History    Not on file   Tobacco Use    Smoking status: Never    Smokeless tobacco: Never   Vaping Use    Vaping status: Never Used   Substance and Sexual Activity    Alcohol use: Never    Drug use: Never    Sexual activity: Not Currently     Partners: Male   Other Topics Concern    Not on file   Social History Narrative    Not on file     Social Determinants of Health     Financial Resource  Strain: Not on file   Transportation Needs: Not  on file   Social Connections: Low Risk  (07/29/2023)    Social Connections     SDOH Social Isolation: 5 or more times a week   Intimate Partner Violence: Not on file   Housing Stability: Not on file       ============================================================================================================================================  GENERAL EXAMINATION  There were no vitals taken for this visit.      Vital signs personally reviewed  General: No acute distress, alert  HEENT: Normocephalic, no scleral icterus  Pulmonary: No accessory muscle use, no tachypnea  Cardiovascular: Heart with regular rate & rhythm  Extremities: No significant edema, No cyanosis    NEUROLOGIC EXAM  On neurological exam, patient was awake, alert and answering questions appropriately  Speech was fluent, without dysarthria or aphasia.    CN  II: not directly tested, grossly intact  III, IV, VI: extraocular movements intact without nystagmus  V: intact to light touch  VII: face symmetric without weakness  VIII: grossly intact  IX, X: symmetric palatal elevation  XI: normal strength of trapezius and sternocleidomastoid bilaterally  XII: tongue midline with full movements    MOTOR  Bulk: normal  Tone: normal  Abnormal Movements: none  Fasciculations: none    Strength: Formal strength testing was deferred. Patient moving all extremities symmetrically and against gravity***    MRC Grading Scale   Right Left   Deltoid *** ***   Biceps *** ***   Triceps *** ***   Wrist Extension *** ***   Wrist Flexion - -   Finger Extension *** ***   Finger Abduction *** ***   Finger Flexion *** ***   Hip Flexion *** ***   Hip Extension - -   Hip Abduction - -   Hip Adduction - -   Knee Extension *** ***   Knee Flexion *** ***   Ankle Dorsiflexion *** ***   Ankle Plantarflexion *** ***   Toe Extension *** ***   Toe Flexion - -     REFLEXES   Right Left   Biceps *** ***   Triceps *** ***   Brachioradialis ***  ***   Patellar *** ***   Achilles *** ***   Plantar *** ***   Hoffman *** ***   Pectoralis - -   Jaw Jerk - -       SENSORY  Light touch: ***  Pin: ***  Vibration: ***  Proprioception: ***    GAIT  General: casual, normal gait  Toe Walk: normal  Heel Walk: normal  Tandem: normal    COORDINATION  Finger nose finger: normal  Heel to shin: normal    ================================================================================================================================LABS  Personal Review of prior labs is notable for: ***  IMAGING  Personal Review of imaging is notable for:   MRI Brain w/o August 2024 - moderate burden of chronic white matter ds, generalized atrophy likely in keeping for age    MRA H&N August 2024  IMPRESSION:  1. No hemodynamically significant stenosis or major arterial vascular occlusion involving the circle of Willis.  2. Atherosclerotic changes.    MRA Carotid August 2024  IMPRESSION:  NO EVIDENCE OF SIGNIFICANT CAROTID OR VERTEBRAL ARTERY STENOSIS ON NECK MRA.    OTHER DIAGNOSTICS  Personal Review of other prior diagnostics is notable for: not applicable

## 2023-11-11 ENCOUNTER — Other Ambulatory Visit: Payer: Self-pay

## 2023-11-11 ENCOUNTER — Other Ambulatory Visit (INDEPENDENT_AMBULATORY_CARE_PROVIDER_SITE_OTHER): Payer: Self-pay | Admitting: Physician Assistant

## 2023-11-11 ENCOUNTER — Ambulatory Visit: Payer: Medicare Other | Attending: Physician Assistant | Admitting: Physician Assistant

## 2023-11-11 ENCOUNTER — Encounter (INDEPENDENT_AMBULATORY_CARE_PROVIDER_SITE_OTHER): Payer: Self-pay | Admitting: Physician Assistant

## 2023-11-11 VITALS — BP 130/76 | HR 81 | Ht 65.0 in | Wt 196.0 lb

## 2023-11-11 DIAGNOSIS — I10 Essential (primary) hypertension: Secondary | ICD-10-CM | POA: Insufficient documentation

## 2023-11-11 DIAGNOSIS — G8929 Other chronic pain: Secondary | ICD-10-CM | POA: Insufficient documentation

## 2023-11-11 DIAGNOSIS — F32A Depression, unspecified: Secondary | ICD-10-CM | POA: Insufficient documentation

## 2023-11-11 DIAGNOSIS — R399 Unspecified symptoms and signs involving the genitourinary system: Secondary | ICD-10-CM | POA: Insufficient documentation

## 2023-11-11 DIAGNOSIS — M545 Low back pain, unspecified: Secondary | ICD-10-CM | POA: Insufficient documentation

## 2023-11-11 DIAGNOSIS — E538 Deficiency of other specified B group vitamins: Secondary | ICD-10-CM | POA: Insufficient documentation

## 2023-11-11 DIAGNOSIS — E1165 Type 2 diabetes mellitus with hyperglycemia: Secondary | ICD-10-CM | POA: Insufficient documentation

## 2023-11-11 DIAGNOSIS — N182 Chronic kidney disease, stage 2 (mild): Secondary | ICD-10-CM | POA: Insufficient documentation

## 2023-11-11 DIAGNOSIS — F419 Anxiety disorder, unspecified: Secondary | ICD-10-CM | POA: Insufficient documentation

## 2023-11-11 DIAGNOSIS — J329 Chronic sinusitis, unspecified: Secondary | ICD-10-CM | POA: Insufficient documentation

## 2023-11-11 LAB — URINALYSIS, MICROSCOPIC
BACTERIA: NEGATIVE /[HPF]
RBCS: 1 /[HPF] (ref <4)
SQUAMOUS EPITHELIAL: 2 /[HPF] (ref <28)
WBCS: 12 /[HPF] — ABNORMAL HIGH (ref <6)

## 2023-11-11 LAB — URINALYSIS, MACROSCOPIC
BILIRUBIN: NEGATIVE mg/dL
BLOOD: NEGATIVE mg/dL
GLUCOSE: >1000 mg/dL — AB
KETONES: NEGATIVE mg/dL
LEUKOCYTES: 25 WBCs/uL — AB
PH: 5.5 (ref 5.0–9.0)
PROTEIN: NEGATIVE mg/dL
SPECIFIC GRAVITY: 1.037 — ABNORMAL HIGH (ref 1.002–1.030)
UROBILINOGEN: NORMAL mg/dL

## 2023-11-11 MED ORDER — CIPROFLOXACIN 500 MG TABLET
500.0000 mg | ORAL_TABLET | Freq: Two times a day (BID) | ORAL | 0 refills | Status: AC
Start: 2023-11-11 — End: 2023-11-21

## 2023-11-11 MED ORDER — MAGNESIUM OXIDE 400 MG (241.3 MG MAGNESIUM) TABLET: 400.0000 mg | ORAL_TABLET | Freq: Two times a day (BID) | ORAL | 3 refills | Status: DC

## 2023-11-11 MED ORDER — CYANOCOBALAMIN (VIT B-12) 1,000 MCG/ML INJECTION SOLUTION
1000.0000 ug | INTRAMUSCULAR | Status: AC
Start: 2023-11-11 — End: 2023-11-11
  Administered 2023-11-11: 1000 ug via SUBCUTANEOUS

## 2023-11-11 MED ORDER — DIAZEPAM 5 MG TABLET
5.0000 mg | ORAL_TABLET | Freq: Two times a day (BID) | ORAL | 0 refills | Status: DC | PRN
Start: 2023-11-11 — End: 2024-02-24

## 2023-11-11 NOTE — Nursing Note (Signed)
11/11/23 1200   Urine test  (S[employer]ens Multistix 10 SG)   Performed Status: Automated   Time collected 1207   Color (Ref Range: Yellow) Yellow   Clarity (Ref Range: Clear) (!) Cloudy   Glucose (Ref Range: Negative mg/dL) (!) 3+ (2595 mg/dl)   Bilirubin (Ref Range: Negative mg/dL) Negative   Ketones (Ref Range: Negative mg/dL) Negative   Urine Specific Gravity (Ref Range: 1.005 - 1.030) 1.010   Blood (urine) (Ref Range: Negative mg/dL) (!) Trace Intact   pH (Ref Range: 5.0 - 8.0) 5.5   Protein (Ref Range: Negative mg/dL) Negative   Urobilinogen (Ref Range: Normal) 0.2mg /dL (Normal)   Nitrite (Ref Range: Negative) (!) Positive   Leukocytes (Ref Range: Negative WBC's/uL) Negative   Initials KD,CMA

## 2023-11-11 NOTE — Telephone Encounter (Signed)
Patient was in office today for routine follow up for medication refill.

## 2023-11-11 NOTE — Progress Notes (Signed)
[facility]  [address]  [place] [address]          Follow Up        Reason for Visit: Follow Up (1 month follow up /)    History of Present Illness  Grace Hoffman is a 72 y.o. female who is being seen today in office for  f/u however has complaints of sinus congestion pressure pain along with urinary pressure frequency.  History of urinary tract infections noted.  At this time she has no cough congestion shortness a breath wheezing.  She denies fever chills body aches.    Follow-up  blood pressure/hypertension with chronic dizziness noted.  Previous visit we discussed possibly her Amaryl causing the symptoms and encouraged her to stop medication increase her long-acting insulin however patient did not follow instructions given so continues on the same medications.  She has shown no signs of the dizziness been related to her blood pressure  and continues to take Vasotec 10 mg daily as well as hydrochlorothiazide 25 mg daily p.r.n..  Today blood pressure stable at 130/76.  Denies chest pain headaches.  She has been seen by neurologist in the past with MRI of her brain noting signs of TIAs.  She has also used physical therapy for the dizziness and gait issues which seemed to help.  Previous neurologist did change coverage for her insurance so she requested a referral to different neurologist with appointment scheduled for this Friday she tells me today that she can not make the appointment because her husband has an appointment in [place].  I encouraged her to call and reschedule it because she knows shows they will not see her again.  She once again had stated she notices the dizziness is worse if her sugar is low so thought possibly hypoglycemia could be related.  She also has a history of Occasional headaches noted as well as misalignment of ear Crystal's and has been seen by the ENT where she had her crystals realigned.       F/U BS w history of uncontrolled diabetes with last  hemoglobin A1c 7.5 however down from 8.6. Currently patient is on metformin at 1000 mg twice daily along with glimepiride 4 mg twice daily Jardiance 25 mg daily Toujeo 35 units nightly and Humalog 5 units with meals.  Noted she was unable to tolerate being injectables such as Ozempic and Mounjaro due to GI side effects.  She has a history of poor compliance noted but states her sugar has been doing better at home when she checks it.  Denies increased thirst or urination.  She does have a history of chronic kidney disease stage 2 noted.     Follow-up B12 deficiency with chronic fatigue issues noted.  Patient is requesting B12 injection today which do seem to help with her energy level.  Last B12 level when checked in lab stable however.     Follow-up chronic pain/DJD/fibromyalgia for which she has been on chronic pain therapy/narcotics for numerous years.  Patient had reported increased pain in her lower back with x-rays showing moderate DJD the MRI of spine ordered.  She did have an MRI was referred to [facility] neurosurgeon who she states she has an appointment on December 26 to obtain injections in her back area.  She also is currently taking Norco 7.5 mg q.6 hours p.r.n. which does help with her chronic pain issues.  She denies any side effects to medication and compliant w urine drug screen.  Requesting medication refill.     Follow-up anxiety/depression with patient currently on Valium 5 mg b.i.d. p.r.n. as well as Effexor 37.5 mg daily.  She states currently her valium was sent last time for daily use and upon looking back through the chart she was discharged from rehab with instructions to use daily thus it seems this is how the medication error occurred.  She is requesting to go back to b.i.d. and needs refills today.  Patient denies panic attack and states anxiety depression overall stable at this current time.     Flu shot up to date    PHQ Questionnaire         Past Medical History:   Diagnosis Date     Anxiety     Chronic back pain     COVID-19 vaccine series completed     Depression     Diabetes mellitus, type 2 (CMS HCC)     Fibromyalgia affecting multiple sites     Hypertension     Hypomagnesemia     Insomnia disorder     Migraine     Mixed hyperlipidemia     Orthostatic hypotension     Osteoarthritis     TIA (transient ischemic attack)     Unspecified glaucoma(365.9)     Vitamin D deficiency          Past Surgical History:   Procedure Laterality Date    HX CHOLECYSTECTOMY      HX KNEE REPLACMENT Left     HX ROTATOR CUFF REPAIR Left     SKIN CANCER EXCISION      forehead removed      Family Medical History:       Problem Relation (Age of Onset)    Kidney failure Mother    No Known Problems Sister, Brother, Half-Sister, Half-Brother, Maternal Aunt, Maternal Uncle, Paternal Aunt, Paternal Uncle, Maternal Grandmother, Maternal Grandfather, Paternal Grandmother, Paternal Grandfather, Daughter, Son    Respiratory Problems Father            Social History     Tobacco Use    Smoking status: Never    Smokeless tobacco: Never   Vaping Use    Vaping status: Never Used   Substance Use Topics    Alcohol use: Never    Drug use: Never     Medication:  d-mannose 500 mg Oral Capsule, Take 1 Capsule (500 mg total) by mouth Twice daily  diclofenac sodium (PENNSAID) 20 mg/gram /actuation(2 %) solution in metered-dose pump, Apply 1 Application  topically Twice daily  empagliflozin (JARDIANCE) 25 mg Oral Tablet, Take 1 Tablet (25 mg total) by mouth Once a day  enalapril maleate (VASOTEC) 10 mg Oral Tablet, Take 1 Tablet (10 mg total) by mouth Once a day for 180 days  ergocalciferol, vitamin D2, (DRISDOL) 1,250 mcg (50,000 unit) Oral Capsule, Take 1 Capsule (50,000 Units total) by mouth Every 7 days (Patient taking differently: Take 1 Capsule (50,000 Units total) by mouth Every 7 days On Monday)  insulin glargine U-300 conc (TOUJEO MAX U-300 SOLOSTAR) 300 unit/mL (3 mL) Subcutaneous Insulin Pen, Inject 35 Units under the skin Every  night  insulin lispro (HUMALOG KWIKPEN INSULIN) 100 unit/mL Subcutaneous Insulin Pen, Use as directed sliding scale; also use 5 units with meals Indications: type 2 diabetes mellitus (Patient taking differently: Use as directed sliding scale; also use up to 5 units with meals Indications: type 2 diabetes mellitus)  latanoprost (XALATAN) 0.005 % Ophthalmic Drops, Administer 1 Drop into the left  eye Every night  loratadine (CLARITIN) 10 mg Oral Tablet, Take 1 Tablet (10 mg total) by mouth Once per day as needed  rosuvastatin (CRESTOR) 20 mg Oral Tablet, Take 1 Tablet (20 mg total) by mouth Every evening  solifenacin (VESICARE) 5 mg Oral Tablet, Take 1 Tablet (5 mg total) by mouth Once a day  SUMAtriptan (IMITREX) 50 mg Oral Tablet, Take 1 Tablet (50 mg total) by mouth Once, as needed for Migraine TAKE 1 TABLET BY MOUTH AT ONSET OF HEADACHE. IF NO RELIEF MAY REPEAT 1 AFTER AT LEAST 2 HOURS. MAX OF 4 TABS PER 24 HOURS  diazePAM (VALIUM) 5 mg Oral Tablet, Take 1 Tablet (5 mg total) by mouth Every 12 hours as needed for Anxiety  HYDROcodone-acetaminophen (NORCO) 7.5-325 mg Oral Tablet, Take 1 Tablet by mouth Every 8 hours as needed for up to 30 days Indications: pain  Levetiracetam 500 mg Oral Tablet Sustained Release 24 hr, Take 1 Tablet (500 mg total) by mouth Every night for 90 days  magnesium oxide (MAG-OX) 400 mg Oral Tablet, Take 2 Tablets (800 mg total) by mouth Once a day  MetFORMIN (GLUCOPHAGE) 1,000 mg Oral Tablet, Take 1 Tablet (1,000 mg total) by mouth Twice daily with food  venlafaxine (EFFEXOR XR) 37.5 mg Oral Capsule, Sust. Release 24 hr, Take 1 Capsule (37.5 mg total) by mouth Every night    No facility-administered medications prior to visit.    Allergies:  Allergies   Allergen Reactions    Codeine Nausea/ Vomiting    Amoxicillin Diarrhea    Macrobid [Nitrofurantoin Monohyd/M-Cryst] Nausea/ Vomiting       Physical Exam:  Vitals:    11/11/23 1135   BP: 130/76   Pulse: 81   SpO2: 94%   Weight: 88.9 kg (196  lb)   Height: 1.651 m (5\' 5" )   BMI: 32.62      Patient overweight  General appearance: alert, cooperative, in no acute distress  HEENT: Ears: clear, no effusion, no erythema; Throat: non-erythematous; no LAD, neck supple, moist mucus membranes  Sinus cavity tenderness on palpation bilateral maxillary and frontal  Also has pressure frontal /maxillary cavities w forward flexion of head  Lungs: clear to auscultation bilaterally; no wheezes or rhonchi   Heart: regular rate and rhythm; normal S1 & S2; no murmur  Abdomen: soft, non-tender, not distended; bowel sounds present  Obese abd  +mild suprapubic tenderness on palp; no cva tenderness  Extremities: extremities normal ROM, no cyanosis or edema ,   Chronic trigger points back chest thighs due to fibromyalgia  Arthritic changes hands knees bilateral  Decreased muscle tone all extremities  Gait stable  Psych: alert and oriented x 3  Neuro: CN 2-12 grossly intact    Urine Dip Results:   Time collected: 1207  Glucose (Ref Range: Negative mg/dL): (!) 3+ (1610 mg/dl)  Bilirubin (Ref Range: Negative mg/dL): Negative  Ketones (Ref Range: Negative mg/dL): Negative  Urine Specific Gravity (Ref Range: 1.005 - 1.030): 1.010  Blood (urine) (Ref Range: Negative mg/dL): (!) Trace Intact  pH (Ref Range: 5.0 - 8.0): 5.5  Protein (Ref Range: Negative mg/dL): Negative  Urobilinogen (Ref Range: Normal): 0.2mg /dL (Normal)  Nitrite (Ref Range: Negative): (!) Positive  Leukocytes (Ref Range: Negative WBC's/uL): Negative       Assessment & Plan  Sinusitis, unspecified chronicity, unspecified location  Cipro 500 b.i.d. times 10 days  May use Flonase over-the-counter as well as humidifier  Urinary symptom or sign  Nitrates noted on urinalysis along with  blood will send for culture  Patient once again given Cipro  Essential hypertension  Stable on current meds  Uncontrolled type 2 diabetes mellitus with hyperglycemia (CMS HCC)  Continue diet blood sugar monitoring  Recent A1c 7.5  Chronic  kidney disease (CKD), stage II (mild)  Renal function stable at this time will continue monitoring  B12 deficiency  B12 injection given  Chronic low back pain, unspecified back pain laterality, unspecified whether sciatica present  On chronic pain meds  Continue Norco 7.5 mg q.8 hours p.r.n.-refills given  Anxiety  Continue Valium 5 mg b.i.d.-refills given  Depression, unspecified depression type  Stable on Effexor 37.5 mg daily     Orders Placed This Encounter    URINE CULTURE,ROUTINE    URINALYSIS, MACROSCOPIC AND MICROSCOPIC W/CULTURE REFLEX    URINALYSIS, MACROSCOPIC    URINALYSIS, MICROSCOPIC    POCT URINE DIPSTICK    cyanocobalamin (VITAMIN B12) 1000 mcg/mL injection    diazePAM (VALIUM) 5 mg Oral Tablet    magnesium oxide (MAG-OX) 400 mg Oral Tablet    ciprofloxacin HCl (CIPRO) 500 mg Oral Tablet           Follow up:   Post-Discharge Follow Up Appointments       Wednesday Jan 27, 2024    Return Patient Visit with Eyvonne Mechanic, PA-C at  1:30 PM      Wednesday Feb 24, 2024    Return Patient Visit with Eyvonne Mechanic, PA-C at 11:30 AM      Wednesday Mar 23, 2024    Return Patient Visit with Eyvonne Mechanic, PA-C at  1:30 PM      Wednesday Apr 20, 2024    Return Patient Visit with Eyvonne Mechanic, PA-C at 11:30 AM      Monday Apr 25, 2024    Return Telephone Visit with Eyvonne Mechanic, PA-C at  1:30 PM      Wednesday May 18, 2024    Return Patient Visit with Eyvonne Mechanic, PA-C at 11:00 AM      Wednesday Jun 15, 2024    Return Patient Visit with Eyvonne Mechanic, PA-C at 11:00 AM      [facility]  [facility]  [address]  [place] [address]  [phone]           Seek medical attention for new or worsening symptoms.  Patient has been seen in this clinic within the last 3 years.     Kolbi Altadonna Hyman Bible, PA-C      I personally reviewed the documentation of care provided by the certified physician assistant and I agree with her medical decision making, Weyman Pedro, D.O.       This note was partially created  using MModal Fluency Direct system (voice recognition software) and is inherently subject to errors including those of syntax and "sound-alike" substitutions which may escape proofreading.  In such instances, original meaning may be extrapolated by contextual derivation.

## 2023-11-12 MED ORDER — HYDROCODONE 7.5 MG-ACETAMINOPHEN 325 MG TABLET
1.0000 | ORAL_TABLET | Freq: Three times a day (TID) | ORAL | 0 refills | Status: DC | PRN
Start: 2023-11-12 — End: 2023-12-30

## 2023-11-13 LAB — URINE CULTURE,ROUTINE: URINE CULTURE: >100000 — AB

## 2023-12-11 ENCOUNTER — Ambulatory Visit: Payer: Medicare Other | Admitting: Physician Assistant

## 2023-12-30 ENCOUNTER — Other Ambulatory Visit: Payer: Self-pay

## 2023-12-30 ENCOUNTER — Other Ambulatory Visit (INDEPENDENT_AMBULATORY_CARE_PROVIDER_SITE_OTHER): Payer: Self-pay | Admitting: Physician Assistant

## 2023-12-30 ENCOUNTER — Ambulatory Visit: Payer: Medicare Other | Attending: Physician Assistant | Admitting: Physician Assistant

## 2023-12-30 ENCOUNTER — Encounter (INDEPENDENT_AMBULATORY_CARE_PROVIDER_SITE_OTHER): Payer: Self-pay | Admitting: Physician Assistant

## 2023-12-30 VITALS — BP 136/78 | HR 78 | Ht 65.0 in | Wt 192.0 lb

## 2023-12-30 DIAGNOSIS — F32A Depression, unspecified: Secondary | ICD-10-CM | POA: Insufficient documentation

## 2023-12-30 DIAGNOSIS — G894 Chronic pain syndrome: Secondary | ICD-10-CM | POA: Insufficient documentation

## 2023-12-30 DIAGNOSIS — M545 Low back pain, unspecified: Secondary | ICD-10-CM | POA: Insufficient documentation

## 2023-12-30 DIAGNOSIS — I129 Hypertensive chronic kidney disease with stage 1 through stage 4 chronic kidney disease, or unspecified chronic kidney disease: Secondary | ICD-10-CM

## 2023-12-30 DIAGNOSIS — F419 Anxiety disorder, unspecified: Secondary | ICD-10-CM | POA: Insufficient documentation

## 2023-12-30 DIAGNOSIS — N182 Chronic kidney disease, stage 2 (mild): Secondary | ICD-10-CM | POA: Insufficient documentation

## 2023-12-30 DIAGNOSIS — E538 Deficiency of other specified B group vitamins: Secondary | ICD-10-CM | POA: Insufficient documentation

## 2023-12-30 DIAGNOSIS — I10 Essential (primary) hypertension: Secondary | ICD-10-CM | POA: Insufficient documentation

## 2023-12-30 DIAGNOSIS — G8929 Other chronic pain: Secondary | ICD-10-CM | POA: Insufficient documentation

## 2023-12-30 DIAGNOSIS — R399 Unspecified symptoms and signs involving the genitourinary system: Secondary | ICD-10-CM | POA: Insufficient documentation

## 2023-12-30 DIAGNOSIS — E1165 Type 2 diabetes mellitus with hyperglycemia: Secondary | ICD-10-CM | POA: Insufficient documentation

## 2023-12-30 LAB — URINALYSIS, MACROSCOPIC
BILIRUBIN: NEGATIVE mg/dL
BLOOD: NEGATIVE mg/dL
GLUCOSE: >1000 mg/dL — AB
KETONES: NEGATIVE mg/dL
LEUKOCYTES: NEGATIVE WBCs/uL
NITRITE: NEGATIVE
PH: 5 (ref 5.0–9.0)
PROTEIN: NEGATIVE mg/dL
SPECIFIC GRAVITY: 1.037 — ABNORMAL HIGH (ref 1.002–1.030)
UROBILINOGEN: NORMAL mg/dL

## 2023-12-30 LAB — URINALYSIS, MICROSCOPIC
RBCS: 2 /[HPF] (ref <4)
WBCS: 12 /[HPF] — ABNORMAL HIGH (ref <6)

## 2023-12-30 MED ORDER — SULFAMETHOXAZOLE 800 MG-TRIMETHOPRIM 160 MG TABLET
1.0000 | ORAL_TABLET | Freq: Two times a day (BID) | ORAL | 0 refills | Status: DC
Start: 2023-12-30 — End: 2024-01-27

## 2023-12-30 MED ORDER — CYANOCOBALAMIN (VIT B-12) 1,000 MCG/ML INJECTION SOLUTION
1000.0000 ug | INTRAMUSCULAR | Status: AC
Start: 2023-12-30 — End: 2023-12-30
  Administered 2023-12-30: 1000 ug via SUBCUTANEOUS

## 2023-12-30 MED ORDER — METFORMIN 1,000 MG TABLET
1000.0000 mg | ORAL_TABLET | Freq: Two times a day (BID) | ORAL | 1 refills | Status: DC
Start: 2023-12-30 — End: 2024-10-10

## 2023-12-30 MED ORDER — LEVETIRACETAM ER 500 MG TABLET,EXTENDED RELEASE 24 HR
1.0000 | ORAL_TABLET | Freq: Every evening | ORAL | 1 refills | Status: DC
Start: 2023-12-30 — End: 2024-11-07

## 2023-12-30 MED ORDER — HYDROCODONE 7.5 MG-ACETAMINOPHEN 325 MG TABLET
1.0000 | ORAL_TABLET | Freq: Three times a day (TID) | ORAL | 0 refills | Status: DC | PRN
Start: 2023-12-30 — End: 2024-01-26

## 2023-12-30 MED ORDER — VENLAFAXINE ER 37.5 MG CAPSULE,EXTENDED RELEASE 24 HR
37.5000 mg | ORAL_CAPSULE | Freq: Every evening | ORAL | 1 refills | Status: DC
Start: 2023-12-30 — End: 2024-02-24

## 2023-12-30 NOTE — Assessment & Plan Note (Signed)
B12 injection given

## 2023-12-30 NOTE — Telephone Encounter (Signed)
Patient is here for routine 1 month follow up for medication refill.

## 2023-12-30 NOTE — Assessment & Plan Note (Signed)
Strict diet encouraged  Continue current meds along with blood sugar monitoring

## 2023-12-30 NOTE — Assessment & Plan Note (Signed)
Continue Effexor XR 37.5 mg daily

## 2023-12-30 NOTE — Assessment & Plan Note (Signed)
Stable on current meds 

## 2023-12-30 NOTE — Nursing Note (Signed)
12/30/23 1000   Urine test  (S[employer]ens Multistix 10 SG)   Performed Status: Automated   Time collected 1029   Color (Ref Range: Yellow) Yellow   Clarity (Ref Range: Clear) (!) Cloudy   Glucose (Ref Range: Negative mg/dL) (!) > or equal to 4010   Bilirubin (Ref Range: Negative mg/dL) Negative   Ketones (Ref Range: Negative mg/dL) Negative   Urine Specific Gravity (Ref Range: 1.005 - 1.030) 1.010   Blood (urine) (Ref Range: Negative mg/dL) Negative   pH (Ref Range: 5.0 - 8.0) 5.5   Protein (Ref Range: Negative mg/dL) Negative   Urobilinogen (Ref Range: Normal) 0.2mg /dL (Normal)   Nitrite (Ref Range: Negative) Negative   Leukocytes (Ref Range: Negative WBC's/uL) Negative   Initials KD, CMA

## 2023-12-30 NOTE — Nursing Note (Signed)
Patient is in the office today as a routine 1 month follow up for medication refill. Patient states that she feels like she still has the UTI.

## 2023-12-30 NOTE — Assessment & Plan Note (Signed)
Continue Valium 5 mg b.i.d.-refills given

## 2023-12-30 NOTE — Assessment & Plan Note (Signed)
On chronic pain meds  Continue Norco 7.5 mg q.8 hours-refills given

## 2023-12-30 NOTE — Assessment & Plan Note (Signed)
Continue following with neurologist

## 2023-12-30 NOTE — Progress Notes (Signed)
[facility]  [address]  [place] [address]          Follow Up        Reason for Visit: Follow Up (Routine 1 month ) B12 inject urine complaints    History of Present Illness  Grace Hoffman is a 73 y.o. female who is being seen today in office for  f/u however complaining of urinary urgency pressure and burning now off and on x 1 week. +hx of recurrent UTI's noted and pt states she's had occ times urine unable to be held and urinated on herself which usually happens when she has a UTI. Denies any fever chills n/v.     F/u  blood pressure/hypertension with chronic dizziness noted.  On a Previous visit we discussed possibly her Amaryl causing the symptoms and encouraged her to stop medication increase her long-acting insulin however patient did not follow instructions given so continues on the same medications.  Since then has stopped Amaryl and thinks dizziness maybe some better.  She has shown no signs of the dizziness being related to her blood pressure  and continues to take Vasotec 10 mg daily as well as hydrochlorothiazide 25 mg daily p.r.n.Marland Kitchen  Day blood pressure stable at 130 2/72.  Denies chest pain.  She has been seen by neurologist in the past with MRI of her brain noting signs of TIAs.  She has also used physical therapy for the dizziness and gait issues which seemed to help.  Previous neurologist did change coverage for her insurance so she requested a referral to different neurologist with appointment scheduled for this Friday she tells me today that she can not make the appointment because her husband has an appointment in [place].  I encouraged her to call and reschedule it because she knows shows they will not see her again.  She once again had stated she notices the dizziness is worse if her sugar is low so thought possibly hypoglycemia could be related.  She also has a history of Occasional headaches noted as well as misalignment of ear Crystal's and has been seen  by the ENT where she had her crystals realigned.       F/U BS w history of uncontrolled diabetes with last hemoglobin 7.5 however down from 8.6. Currently patient is on metformin at 1000 mg twice daily along with Jardiance 25 mg daily Toujeo 35 units nightly and Humalog 5 units with meals.  Noted she was unable to tolerate being injectables such as Ozempic and Mounjaro due to GI side effects.  She has a history of poor compliance noted but states her sugar has been doing better at home when she checks it.  Denies increased thirst or urination.  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       Follow-up Cholesterol/lipids for which she is currently on Crestor 20 mg nightly.  At this time she has been side effects or issues with medication and follow-up labs needed.     Follow-up B12 deficiency with chronic fatigue issues noted.  Patient is requesting B12 injection today which do seem to help with her energy level.  Last B12 level when checked in lab stable however.     Follow-up chronic pain/DJD/fibromyalgia for which she has been on chronic pain therapy/narcotics for numerous years.  Patient had reported increased pain in her lower back with x-rays showing moderate DJD the MRI of  spine ordered.  She did have an MRI was referred to [facility] neurosurgeon and an appointment on December 26 to obtain injections in her back area.  She also is currently taking Norco 7.5 mg q.6 hours p.r.n. which does help with her chronic pain issues.  She denies any side effects to medication and compliant w urine drug screen.  Requesting medication refill.     Follow-up anxiety/depression with patient currently on Valium 5 mg b.i.d. p.r.n. as well as Effexor 37.5 mg daily.  She states currently her valium was sent last time for daily use and upon looking back through the chart she was discharged from rehab with instructions to use daily thus it seems this is how the  medication error occurred.  She is requesting to go back to b.i.d. and needs refills today.  Patient denies panic attack and states anxiety depression overall stable at this current time.     Flu shot up to date    PHQ Questionnaire               Past Medical History:   Diagnosis Date    Anxiety     Chronic back pain     COVID-19 vaccine series completed     Depression     Diabetes mellitus, type 2 (CMS HCC)     Fibromyalgia affecting multiple sites     Hypertension     Hypomagnesemia     Insomnia disorder     Migraine     Mixed hyperlipidemia     Orthostatic hypotension     Osteoarthritis     TIA (transient ischemic attack)     Unspecified glaucoma(365.9)     Vitamin D deficiency          Past Surgical History:   Procedure Laterality Date    HX CHOLECYSTECTOMY      HX KNEE REPLACMENT Left     HX ROTATOR CUFF REPAIR Left     SKIN CANCER EXCISION      forehead removed      Family Medical History:       Problem Relation (Age of Onset)    Kidney failure Mother    No Known Problems Sister, Brother, Half-Sister, Half-Brother, Maternal Aunt, Maternal Uncle, Paternal Aunt, Paternal Uncle, Maternal Grandmother, Maternal Grandfather, Paternal Grandmother, Paternal Grandfather, Daughter, Son    Respiratory Problems Father            Social History     Tobacco Use    Smoking status: Never    Smokeless tobacco: Never   Vaping Use    Vaping status: Never Used   Substance Use Topics    Alcohol use: Never    Drug use: Never     Medication:  d-mannose 500 mg Oral Capsule, Take 1 Capsule (500 mg total) by mouth Twice daily  diazePAM (VALIUM) 5 mg Oral Tablet, Take 1 Tablet (5 mg total) by mouth Every 12 hours as needed for Anxiety  diclofenac sodium (PENNSAID) 20 mg/gram /actuation(2 %) solution in metered-dose pump, Apply 1 Application  topically Twice daily  empagliflozin (JARDIANCE) 25 mg Oral Tablet, Take 1 Tablet (25 mg total) by mouth Once a day  enalapril maleate (VASOTEC) 10 mg Oral Tablet, Take 1 Tablet (10 mg total) by  mouth Once a day for 180 days  ergocalciferol, vitamin D2, (DRISDOL) 1,250 mcg (50,000 unit) Oral Capsule, Take 1 Capsule (50,000 Units total) by mouth Every 7 days (Patient taking differently: Take 1 Capsule (50,000 Units total) by mouth Every  7 days On Monday)  insulin glargine U-300 conc (TOUJEO MAX U-300 SOLOSTAR) 300 unit/mL (3 mL) Subcutaneous Insulin Pen, Inject 35 Units under the skin Every night  insulin lispro (HUMALOG KWIKPEN INSULIN) 100 unit/mL Subcutaneous Insulin Pen, Use as directed sliding scale; also use 5 units with meals Indications: type 2 diabetes mellitus (Patient taking differently: Use as directed sliding scale; also use up to 5 units with meals Indications: type 2 diabetes mellitus)  latanoprost (XALATAN) 0.005 % Ophthalmic Drops, Administer 1 Drop into the left eye Every night  loratadine (CLARITIN) 10 mg Oral Tablet, Take 1 Tablet (10 mg total) by mouth Once per day as needed  magnesium oxide (MAG-OX) 400 mg Oral Tablet, Take 1 Tablet (400 mg total) by mouth Twice daily  rosuvastatin (CRESTOR) 20 mg Oral Tablet, Take 1 Tablet (20 mg total) by mouth Every evening  solifenacin (VESICARE) 5 mg Oral Tablet, Take 1 Tablet (5 mg total) by mouth Once a day  SUMAtriptan (IMITREX) 50 mg Oral Tablet, Take 1 Tablet (50 mg total) by mouth Once, as needed for Migraine TAKE 1 TABLET BY MOUTH AT ONSET OF HEADACHE. IF NO RELIEF MAY REPEAT 1 AFTER AT LEAST 2 HOURS. MAX OF 4 TABS PER 24 HOURS  HYDROcodone-acetaminophen (NORCO) 7.5-325 mg Oral Tablet, Take 1 Tablet by mouth Every 8 hours as needed for up to 30 days Indications: pain  Levetiracetam 500 mg Oral Tablet Sustained Release 24 hr, Take 1 Tablet (500 mg total) by mouth Every night for 90 days  MetFORMIN (GLUCOPHAGE) 1,000 mg Oral Tablet, Take 1 Tablet (1,000 mg total) by mouth Twice daily with food  venlafaxine (EFFEXOR XR) 37.5 mg Oral Capsule, Sust. Release 24 hr, Take 1 Capsule (37.5 mg total) by mouth Every night    No facility-administered  medications prior to visit.    Allergies:  Allergies   Allergen Reactions    Codeine Nausea/ Vomiting    Amoxicillin Diarrhea    Macrobid [Nitrofurantoin Monohyd/M-Cryst] Nausea/ Vomiting       Physical Exam:  Vitals:    12/30/23 1012   BP: 136/78   Pulse: 78   SpO2: 95%   Weight: 87.1 kg (192 lb)   Height: 1.651 m (5\' 5" )   BMI: 31.95        Patient overweight  General appearance: alert, cooperative, in no acute distress  HEENT: Ears: clear, no effusion, no erythema; Throat: non-erythematous; no LAD, neck supple, moist mucus membranes  Lungs: clear to auscultation bilaterally; no wheezes or rhonchi   Heart: regular rate and rhythm; normal S1 & S2; no murmur  Abdomen: soft, +mild suprapubic tender noted   No cva tenderness  abd not distended; bowel sounds present  Obese abd  Extremities: extremities normal ROM, no cyanosis or edema ,   Chronic trigger points back chest thighs due to fibromyalgia  Arthritic changes hands knees bilateral  Decreased muscle tone all extremities  Gait stable  Psych: alert and oriented x 3  Neuro: CN 2-12 grossly intact      Urine Dip Results:   Time collected: 1029  Glucose (Ref Range: Negative mg/dL): (!) > or equal to 3557  Bilirubin (Ref Range: Negative mg/dL): Negative  Ketones (Ref Range: Negative mg/dL): Negative  Urine Specific Gravity (Ref Range: 1.005 - 1.030): 1.010  Blood (urine) (Ref Range: Negative mg/dL): Negative  pH (Ref Range: 5.0 - 8.0): 5.5  Protein (Ref Range: Negative mg/dL): Negative  Urobilinogen (Ref Range: Normal): 0.2mg /dL (Normal)  Nitrite (Ref Range: Negative): Negative  Leukocytes (  Ref Range: Negative WBC's/uL): Negative      Assessment & Plan  Urinary symptom or sign  Will send urine for culture  Bactrim-DS b.i.d. times 10 days  Essential hypertension  Stable on current meds  Chronic kidney disease (CKD), stage II (mild)  Continue following with neurologist  Uncontrolled type 2 diabetes mellitus with hyperglycemia (CMS HCC)  Strict diet encouraged  Continue  current meds along with blood sugar monitoring  B12 deficiency  B12 injection given  Chronic low back pain, unspecified back pain laterality, unspecified whether sciatica present  On chronic pain meds  Continue Norco 7.5 mg q.8 hours-refills given  Chronic pain syndrome    Anxiety  Continue Valium 5 mg b.i.d.-refills given  Depression, unspecified depression type  Continue Effexor XR 37.5 mg daily     Orders Placed This Encounter    URINE CULTURE,ROUTINE    URINALYSIS, MACROSCOPIC AND MICROSCOPIC W/CULTURE REFLEX    URINALYSIS, MACROSCOPIC    URINALYSIS, MICROSCOPIC    POCT URINE DIPSTICK    MetFORMIN (GLUCOPHAGE) 1,000 mg Oral Tablet    venlafaxine (EFFEXOR XR) 37.5 mg Oral Capsule, Sust. Release 24 hr    Levetiracetam 500 mg Oral Tablet Sustained Release 24 hr    trimethoprim-sulfamethoxazole (BACTRIM DS) 160-800mg  per tablet    cyanocobalamin (VITAMIN B12) 1000 mcg/mL injection           Follow up:   Post-Discharge Follow Up Appointments       Wednesday Jan 27, 2024    Return Patient Visit with Eyvonne Mechanic, PA-C at  1:30 PM      Wednesday Feb 24, 2024    Return Patient Visit with Eyvonne Mechanic, PA-C at 11:30 AM      Wednesday Mar 23, 2024    Return Patient Visit with Eyvonne Mechanic, PA-C at  1:30 PM      Wednesday Apr 20, 2024    Return Patient Visit with Eyvonne Mechanic, PA-C at 11:30 AM      Monday Apr 25, 2024    Return Telephone Visit with Eyvonne Mechanic, PA-C at  1:30 PM      Wednesday May 18, 2024    Return Patient Visit with Eyvonne Mechanic, PA-C at 11:00 AM      Wednesday Jun 15, 2024    Return Patient Visit with Eyvonne Mechanic, PA-C at 11:00 AM      [facility]  [facility]  [address]  [place] [address]  [phone]           Seek medical attention for new or worsening symptoms.  Patient has been seen in this clinic within the last 3 years.     Amy Hyman Bible, PA-C      I personally reviewed the documentation of care provided by the certified physician assistant and I agree with her medical  decision making. PDMP reviewed. Controlled substance contract on file. Per PCP, pt continues to benefit from pain medication and I did send in a prescription for one month. Weyman Pedro, D.O.       This note was partially created using M[employer] Fluency Direct system (voice recognition software) and is inherently subject to errors including those of syntax and "sound-alike" substitutions which may escape proofreading.  In such instances, original meaning may be extrapolated by contextual derivation.

## 2024-01-02 LAB — URINE CULTURE,ROUTINE: URINE CULTURE: >100000 — AB

## 2024-01-07 ENCOUNTER — Telehealth (INDEPENDENT_AMBULATORY_CARE_PROVIDER_SITE_OTHER): Payer: Self-pay | Admitting: Physician Assistant

## 2024-01-07 MED ORDER — VANCOMYCIN 125 MG CAPSULE
250.0000 mg | ORAL_CAPSULE | Freq: Two times a day (BID) | ORAL | 0 refills | Status: DC
Start: 2024-01-07 — End: 2024-01-27

## 2024-01-07 NOTE — Telephone Encounter (Signed)
-----   Message from Amy Alvis sent at 01/06/2024  8:25 AM EST -----  125mg  2 po bid x 10 days

## 2024-01-11 ENCOUNTER — Ambulatory Visit (INDEPENDENT_AMBULATORY_CARE_PROVIDER_SITE_OTHER): Payer: Self-pay | Admitting: Physician Assistant

## 2024-01-11 ENCOUNTER — Other Ambulatory Visit (INDEPENDENT_AMBULATORY_CARE_PROVIDER_SITE_OTHER): Payer: Self-pay

## 2024-01-11 NOTE — Assessment & Plan Note (Signed)
 Renal function stable at this time will continue monitoring

## 2024-01-11 NOTE — Assessment & Plan Note (Signed)
 Continue Valium 5 mg b.i.d.-refills given

## 2024-01-11 NOTE — Assessment & Plan Note (Signed)
 Continue diet blood sugar monitoring  Recent A1c 7.5

## 2024-01-11 NOTE — Assessment & Plan Note (Signed)
 On chronic pain meds  Continue Norco 7.5 mg q.8 hours p.r.n.-refills given

## 2024-01-11 NOTE — Assessment & Plan Note (Signed)
Stable on current meds 

## 2024-01-11 NOTE — Assessment & Plan Note (Signed)
B12 injection given

## 2024-01-11 NOTE — Assessment & Plan Note (Signed)
 Stable on Effexor 37.5 mg daily

## 2024-01-26 ENCOUNTER — Other Ambulatory Visit (INDEPENDENT_AMBULATORY_CARE_PROVIDER_SITE_OTHER): Payer: Self-pay | Admitting: Physician Assistant

## 2024-01-26 MED ORDER — HYDROCODONE 7.5 MG-ACETAMINOPHEN 325 MG TABLET
1.0000 | ORAL_TABLET | Freq: Three times a day (TID) | ORAL | 0 refills | Status: DC | PRN
Start: 2024-01-26 — End: 2024-02-24

## 2024-01-26 NOTE — Nursing Note (Signed)
1 month follow up for med refills.

## 2024-01-27 ENCOUNTER — Encounter (INDEPENDENT_AMBULATORY_CARE_PROVIDER_SITE_OTHER): Payer: Self-pay | Admitting: Physician Assistant

## 2024-01-27 ENCOUNTER — Ambulatory Visit: Payer: Medicare Other | Admitting: Physician Assistant

## 2024-01-27 ENCOUNTER — Other Ambulatory Visit: Payer: Self-pay

## 2024-01-27 DIAGNOSIS — G8929 Other chronic pain: Secondary | ICD-10-CM | POA: Insufficient documentation

## 2024-01-27 DIAGNOSIS — N390 Urinary tract infection, site not specified: Secondary | ICD-10-CM | POA: Insufficient documentation

## 2024-01-27 DIAGNOSIS — E538 Deficiency of other specified B group vitamins: Secondary | ICD-10-CM | POA: Insufficient documentation

## 2024-01-27 DIAGNOSIS — F419 Anxiety disorder, unspecified: Secondary | ICD-10-CM | POA: Insufficient documentation

## 2024-01-27 DIAGNOSIS — G894 Chronic pain syndrome: Secondary | ICD-10-CM | POA: Insufficient documentation

## 2024-01-27 DIAGNOSIS — N183 Chronic kidney disease, stage 3 unspecified: Secondary | ICD-10-CM | POA: Insufficient documentation

## 2024-01-27 DIAGNOSIS — E559 Vitamin D deficiency, unspecified: Secondary | ICD-10-CM | POA: Insufficient documentation

## 2024-01-27 DIAGNOSIS — M545 Low back pain, unspecified: Secondary | ICD-10-CM | POA: Insufficient documentation

## 2024-01-27 DIAGNOSIS — Z7985 Long-term (current) use of injectable non-insulin antidiabetic drugs: Secondary | ICD-10-CM

## 2024-01-27 DIAGNOSIS — I10 Essential (primary) hypertension: Secondary | ICD-10-CM | POA: Insufficient documentation

## 2024-01-27 DIAGNOSIS — Z7984 Long term (current) use of oral hypoglycemic drugs: Secondary | ICD-10-CM

## 2024-01-27 DIAGNOSIS — F32A Depression, unspecified: Secondary | ICD-10-CM | POA: Insufficient documentation

## 2024-01-27 DIAGNOSIS — E785 Hyperlipidemia, unspecified: Secondary | ICD-10-CM | POA: Insufficient documentation

## 2024-01-27 DIAGNOSIS — E1165 Type 2 diabetes mellitus with hyperglycemia: Secondary | ICD-10-CM | POA: Insufficient documentation

## 2024-01-27 DIAGNOSIS — N1831 Chronic kidney disease, stage 3a: Secondary | ICD-10-CM | POA: Insufficient documentation

## 2024-01-27 LAB — URINALYSIS, MACROSCOPIC
BILIRUBIN: NEGATIVE mg/dL
BLOOD: NEGATIVE mg/dL
GLUCOSE: 500 mg/dL — AB
LEUKOCYTES: 250 WBCs/uL — AB
NITRITE: NEGATIVE
PH: 5.5 (ref 5.0–9.0)
PROTEIN: 20 mg/dL
SPECIFIC GRAVITY: 1.029 (ref 1.002–1.030)
UROBILINOGEN: NORMAL mg/dL

## 2024-01-27 LAB — CBC WITH DIFF
BASOPHIL #: 0 10*3/uL (ref 0.00–0.10)
BASOPHIL %: 1 % (ref 0–1)
EOSINOPHIL #: 0.3 10*3/uL (ref 0.00–0.50)
EOSINOPHIL %: 4 % (ref 1–7)
HCT: 41.7 % (ref 31.2–41.9)
HGB: 13.7 g/dL (ref 10.9–14.3)
LYMPHOCYTE #: 2.8 10*3/uL (ref 1.10–3.10)
LYMPHOCYTE %: 40 % (ref 16–46)
MCH: 32.3 pg (ref 24.7–32.8)
MCHC: 32.8 g/dL (ref 32.3–35.6)
MCV: 98.3 fL — ABNORMAL HIGH (ref 75.5–95.3)
MONOCYTE #: 0.5 10*3/uL (ref 0.20–0.90)
MONOCYTE %: 7 % (ref 4–11)
MPV: 11.4 fL — ABNORMAL HIGH (ref 7.9–10.8)
NEUTROPHIL #: 3.4 10*3/uL (ref 1.90–8.20)
NEUTROPHIL %: 49 % (ref 43–77)
PLATELETS: 138 10*3/uL — ABNORMAL LOW (ref 140–440)
RBC: 4.24 10*6/uL (ref 3.63–4.92)
RDW: 13.4 % (ref 12.3–17.7)
WBC: 6.9 10*3/uL (ref 3.8–11.8)

## 2024-01-27 LAB — LIPID PANEL
CHOL/HDL RATIO: 5.7
CHOLESTEROL: 234 mg/dL — ABNORMAL HIGH (ref <200)
HDL CHOL: 41 mg/dL (ref >=40)
LDL CALC: 123 mg/dL — ABNORMAL HIGH (ref 0–100)
TRIGLYCERIDES: 351 mg/dL — ABNORMAL HIGH (ref <=150)
VLDL CALC: 70 mg/dL — ABNORMAL HIGH (ref 0–50)

## 2024-01-27 LAB — COMPREHENSIVE METABOLIC PNL, FASTING
ALBUMIN/GLOBULIN RATIO: 1.2 (ref 0.8–1.4)
ALBUMIN: 3.6 g/dL (ref 3.5–5.7)
ALKALINE PHOSPHATASE: 88 U/L (ref 34–104)
ALT (SGPT): 15 U/L (ref 7–52)
ANION GAP: 9 mmol/L (ref 4–13)
AST (SGOT): 14 U/L (ref 13–39)
BILIRUBIN TOTAL: 0.3 mg/dL (ref 0.3–1.0)
BUN/CREA RATIO: 14 (ref 6–22)
BUN: 12 mg/dL (ref 7–25)
CALCIUM, CORRECTED: 9.4 mg/dL (ref 8.9–10.8)
CALCIUM: 9.1 mg/dL (ref 8.6–10.3)
CHLORIDE: 103 mmol/L (ref 98–107)
CO2 TOTAL: 24 mmol/L (ref 21–31)
CREATININE: 0.86 mg/dL (ref 0.60–1.30)
ESTIMATED GFR: 70 mL/min/{1.73_m2} (ref >59)
GLOBULIN: 3.1 (ref 2.0–3.5)
GLUCOSE: 302 mg/dL — ABNORMAL HIGH (ref 74–109)
OSMOLALITY, CALCULATED: 283 mosm/kg (ref 270–290)
POTASSIUM: 4.9 mmol/L (ref 3.5–5.1)
PROTEIN TOTAL: 6.7 g/dL (ref 6.4–8.9)
SODIUM: 136 mmol/L (ref 136–145)

## 2024-01-27 LAB — IRON TRANSFERRIN AND TIBC
IRON (TRANSFERRIN) SATURATION: 26 % (ref 15–50)
IRON: 79 ug/dL (ref 50–212)
TOTAL IRON BINDING CAPACITY: 307 ug/dL (ref 250–450)
TRANSFERRIN: 219 mg/dL (ref 203–362)
UIBC: 228 ug/dL (ref 130–375)

## 2024-01-27 LAB — MICROALBUMIN/CREATININE RATIO, URINE, RANDOM
CREATININE RANDOM URINE: 194 mg/dL — ABNORMAL HIGH (ref 30–125)
MICROALBUMIN RANDOM URINE: 2.8 mg/dL
MICROALBUMIN/CREATININE RATIO RANDOM URINE: 14.4 mg/g

## 2024-01-27 LAB — SCAN DIFFERENTIAL: PLATELET MORPHOLOGY COMMENT: ADEQUATE

## 2024-01-27 LAB — VITAMIN B12: VITAMIN B 12: >1500 pg/mL — ABNORMAL HIGH (ref 180–914)

## 2024-01-27 LAB — VITAMIN D 25 TOTAL: VITAMIN D 25, TOTAL: 23.08 ng/mL — ABNORMAL LOW (ref 30.00–100.00)

## 2024-01-27 LAB — URINALYSIS, MICROSCOPIC
BACTERIA: NEGATIVE /[HPF]
RBCS: 3 /[HPF] (ref <4)
SQUAMOUS EPITHELIAL: 2 /[HPF] (ref <28)
WBCS: 15 /[HPF] — ABNORMAL HIGH (ref <6)

## 2024-01-27 LAB — MAGNESIUM: MAGNESIUM: 1.5 mg/dL — ABNORMAL LOW (ref 1.9–2.7)

## 2024-01-27 LAB — HGA1C (HEMOGLOBIN A1C WITH EST AVG GLUCOSE): HEMOGLOBIN A1C: 11.9 % — ABNORMAL HIGH (ref 4.0–6.0)

## 2024-01-27 LAB — THYROID STIMULATING HORMONE WITH FREE T4 REFLEX: TSH: 1.193 u[IU]/mL (ref 0.450–5.330)

## 2024-01-27 MED ORDER — SULFAMETHOXAZOLE 800 MG-TRIMETHOPRIM 160 MG TABLET
1.0000 | ORAL_TABLET | Freq: Two times a day (BID) | ORAL | 0 refills | Status: AC
Start: 2024-01-27 — End: 2024-02-06

## 2024-01-27 MED ORDER — SUMATRIPTAN 50 MG TABLET: 50.0000 mg | ORAL_TABLET | Freq: Once | ORAL | 2 refills | Status: DC | PRN

## 2024-01-27 MED ORDER — CYANOCOBALAMIN (VIT B-12) 1,000 MCG/ML INJECTION SOLUTION
1000.0000 ug | INTRAMUSCULAR | Status: AC
Start: 2024-01-27 — End: 2024-01-27
  Administered 2024-01-27: 1000 ug via SUBCUTANEOUS

## 2024-01-27 MED ORDER — ERGOCALCIFEROL (VITAMIN D2) 1,250 MCG (50,000 UNIT) CAPSULE
50000.0000 [IU] | ORAL_CAPSULE | ORAL | 1 refills | Status: DC
Start: 2024-01-27 — End: 2024-03-23

## 2024-01-27 NOTE — Assessment & Plan Note (Signed)
Labs obtained in office today  Continue current meds along with diet blood sugar monitoring

## 2024-01-27 NOTE — Assessment & Plan Note (Signed)
 Continue magnesium supplement

## 2024-01-27 NOTE — Assessment & Plan Note (Signed)
 Continue Valium 5 mg b.i.d.-refills given

## 2024-01-27 NOTE — Assessment & Plan Note (Signed)
 Stable on current meds  Continue monitoring outpatient

## 2024-01-27 NOTE — Assessment & Plan Note (Signed)
Continue Crestor 20 mg nightly along with diet

## 2024-01-27 NOTE — Assessment & Plan Note (Signed)
 On chronic pain meds  Continue Norco 7.5 mg q.8 hours-refills given

## 2024-01-27 NOTE — Assessment & Plan Note (Signed)
Stable on Effexor XR 37.5 mg daily

## 2024-01-27 NOTE — Nursing Note (Signed)
Patient is here for 1 month follow up for primary HTN. Refills.

## 2024-01-27 NOTE — Assessment & Plan Note (Signed)
 B12 injection given

## 2024-01-27 NOTE — Progress Notes (Signed)
[facility]  [address]  [place] [address]          Follow Up        Reason for Visit: Follow Up (Primary Hypertension.)    History of Present Illness  Grace Hoffman is a 73 y.o. female who is being seen today in office for  f/u however complaining of recurrent urinary tract infection symptoms including urinary urgency pressure and burning now off and on x 1 week. Denies any fever chills n/v.    Requesting urinalysis.    F/u  blood pressure/hypertension with chronic dizziness noted.  On a Previous visit we discussed possibly her Amaryl causing the symptoms and encouraged her to stop medication increase her long-acting insulin however patient did not follow instructions given so continues on the same medications.  Since then has stopped Amaryl and thinks dizziness maybe some better.  She has shown no signs of the dizziness being related to her blood pressure  and continues to take Vasotec 10 mg daily as well as hydrochlorothiazide 25 mg daily p.r.n.Marland Kitchen  Day blood pressure stable at 130 2/72.  Denies chest pain.  She has been seen by neurologist in the past with MRI of her brain noting signs of TIAs.  She has also used physical therapy for the dizziness and gait issues which seemed to help.  She once again had stated she notices the dizziness is worse if her sugar is low so thought possibly hypoglycemia could be related.  She also has a history of Occasional headaches noted as well as misalignment of ear Crystal's and has been seen by the ENT where she had her crystals realigned.       F/U BS w history of uncontrolled diabetes with last hemoglobin 7.5 however down from 8.6. Currently patient is on metformin at 1000 mg twice daily along with Jardiance 25 mg daily Toujeo 35 units nightly and Humalog 5 units with meals.  Noted she was unable to tolerate being injectables such as Ozempic and Mounjaro due to GI side effects.  She has a history of poor compliance noted but states her sugar  has been doing better at home when she checks it.  Denies increased thirst or urination.  She does have a history of chronic kidney disease  however renal function overall stable with last BUN 18 creatinine 1.08  GFR 54.    Follow-up Cholesterol/lipids for which she is currently on Crestor 20 mg nightly.  At this time she has been side effects or issues with medication and follow-up labs needed.     Follow-up B12 deficiency with chronic fatigue issues noted.  Patient is requesting B12 injection today which do seem to help with her energy level.  She also has a history of low magnesium however denies cramping at this time.      Follow-up chronic pain/DJD/fibromyalgia for which she has been on chronic pain therapy/narcotics for numerous years.  Patient had reported increased pain in her lower back with x-rays showing moderate DJD the MRI of spine ordered.  She did have an MRI was referred to [facility] neurosurgeon and had an appointment on December 17, 2023 to obtain injections in her back area.  She also is currently taking Norco 7.5 mg q.6 hours p.r.n. which does help with her chronic pain issues.  She denies any side effects to medication and compliant w urine drug screen.  Requesting medication refill.     Follow-up anxiety/depression with patient currently on Valium 5 mg  b.i.d. p.r.n. as well as Effexor 37.5 mg daily.  Patient overall doing well at current doses.  Patient denies panic attack and states anxiety depression overall stable at this current time.     Flu shot up to date    PHQ Questionnaire           Past Medical History:   Diagnosis Date    Anxiety     Chronic back pain     COVID-19 vaccine series completed     Depression     Diabetes mellitus, type 2 (CMS HCC)     Fibromyalgia affecting multiple sites     Hypertension     Hypomagnesemia     Insomnia disorder     Migraine     Mixed hyperlipidemia     Orthostatic hypotension     Osteoarthritis     TIA (transient ischemic attack)     Unspecified  glaucoma(365.9)     Vitamin D deficiency          Past Surgical History:   Procedure Laterality Date    HX CHOLECYSTECTOMY      HX KNEE REPLACMENT Left     HX ROTATOR CUFF REPAIR Left     SKIN CANCER EXCISION      forehead removed      Family Medical History:       Problem Relation (Age of Onset)    Kidney failure Mother    No Known Problems Sister, Brother, Half-Sister, Half-Brother, Maternal Aunt, Maternal Uncle, Paternal Aunt, Paternal Uncle, Maternal Grandmother, Maternal Grandfather, Paternal Grandmother, Paternal Grandfather, Daughter, Son    Respiratory Problems Father            Social History     Tobacco Use    Smoking status: Never    Smokeless tobacco: Never   Vaping Use    Vaping status: Never Used   Substance Use Topics    Alcohol use: Never    Drug use: Never     Medication:  d-mannose 500 mg Oral Capsule, Take 1 Capsule (500 mg total) by mouth Twice daily  diazePAM (VALIUM) 5 mg Oral Tablet, Take 1 Tablet (5 mg total) by mouth Every 12 hours as needed for Anxiety  empagliflozin (JARDIANCE) 25 mg Oral Tablet, Take 1 Tablet (25 mg total) by mouth Once a day  enalapril maleate (VASOTEC) 10 mg Oral Tablet, Take 1 Tablet (10 mg total) by mouth Once a day for 180 days  HYDROcodone-acetaminophen (NORCO) 7.5-325 mg Oral Tablet, Take 1 Tablet by mouth Every 8 hours as needed for up to 30 days Indications: pain  insulin glargine U-300 conc (TOUJEO MAX U-300 SOLOSTAR) 300 unit/mL (3 mL) Subcutaneous Insulin Pen, Inject 35 Units under the skin Every night  insulin lispro (HUMALOG KWIKPEN INSULIN) 100 unit/mL Subcutaneous Insulin Pen, Use as directed sliding scale; also use 5 units with meals Indications: type 2 diabetes mellitus (Patient taking differently: Use as directed sliding scale; also use up to 5 units with meals Indications: type 2 diabetes mellitus)  latanoprost (XALATAN) 0.005 % Ophthalmic Drops, Administer 1 Drop into the left eye Every night  Levetiracetam 500 mg Oral Tablet Sustained Release 24 hr,  Take 1 Tablet (500 mg total) by mouth Every night  loratadine (CLARITIN) 10 mg Oral Tablet, Take 1 Tablet (10 mg total) by mouth Once per day as needed  magnesium oxide (MAG-OX) 400 mg Oral Tablet, Take 1 Tablet (400 mg total) by mouth Twice daily  MetFORMIN (GLUCOPHAGE) 1,000 mg Oral Tablet, Take 1  Tablet (1,000 mg total) by mouth Twice daily with food  rosuvastatin (CRESTOR) 20 mg Oral Tablet, Take 1 Tablet (20 mg total) by mouth Every evening  solifenacin (VESICARE) 5 mg Oral Tablet, Take 1 Tablet (5 mg total) by mouth Once a day  venlafaxine (EFFEXOR XR) 37.5 mg Oral Capsule, Sust. Release 24 hr, Take 1 Capsule (37.5 mg total) by mouth Every night  diclofenac sodium (PENNSAID) 20 mg/gram /actuation(2 %) solution in metered-dose pump, Apply 1 Application  topically Twice daily  ergocalciferol, vitamin D2, (DRISDOL) 1,250 mcg (50,000 unit) Oral Capsule, Take 1 Capsule (50,000 Units total) by mouth Every 7 days (Patient taking differently: Take 1 Capsule (50,000 Units total) by mouth Every 7 days On Monday)  SUMAtriptan (IMITREX) 50 mg Oral Tablet, Take 1 Tablet (50 mg total) by mouth Once, as needed for Migraine TAKE 1 TABLET BY MOUTH AT ONSET OF HEADACHE. IF NO RELIEF MAY REPEAT 1 AFTER AT LEAST 2 HOURS. MAX OF 4 TABS PER 24 HOURS  trimethoprim-sulfamethoxazole (BACTRIM DS) 160-800mg  per tablet, Take 1 Tablet (160 mg total) by mouth Twice daily for 10 days  vancomycin (VANCOCIN) 125 mg Oral Capsule, Take 2 Capsules (250 mg total) by mouth Twice daily for 10 days    No facility-administered medications prior to visit.    Allergies:  Allergies   Allergen Reactions    Codeine Nausea/ Vomiting    Amoxicillin Diarrhea    Macrobid [Nitrofurantoin Monohyd/M-Cryst] Nausea/ Vomiting       Physical Exam:  There were no vitals filed for this visit.     Patient overweight  General appearance: alert, cooperative, in no acute distress  HEENT: Ears: clear, no effusion, no erythema; Throat: non-erythematous; no LAD, neck supple,  moist mucus membranes  Lungs: clear to auscultation bilaterally; no wheezes or rhonchi   Heart: regular rate and rhythm; normal S1 & S2; no murmur  Abdomen: soft, +mild suprapubic tender noted   No cva tenderness  abd not distended; bowel sounds present  Obese abd  Extremities: extremities normal ROM, no cyanosis or edema ,   Chronic trigger points back chest thighs due to fibromyalgia  Arthritic changes hands knees bilateral  Decreased muscle tone all extremities  Gait stable  Psych: alert and oriented x 3  Neuro: CN 2-12 grossly intact    Urine Dip Results:   Glucose (Ref Range: Negative mg/dL): (!) 1+ (272 mg/dl)  Bilirubin (Ref Range: Negative mg/dL): Negative  Ketones (Ref Range: Negative mg/dL): (!) Trace (5 mg/dl)  Urine Specific Gravity (Ref Range: 1.005 - 1.030): 1.030  Blood (urine) (Ref Range: Negative mg/dL): Negative  pH (Ref Range: 5.0 - 8.0): 6.0  Protein (Ref Range: Negative mg/dL): (!) 1+ (30mg /dL)  Urobilinogen (Ref Range: Normal): 0.2mg /dL (Normal)  Nitrite (Ref Range: Negative): Negative  Leukocytes (Ref Range: Negative WBC's/uL): Trace       Assessment & Plan  Recurrent urinary tract infection  Urine sent for culture  Bactrim-DS b.i.d. times 10 days given  Essential hypertension  Stable on current meds  Continue monitoring outpatient  Uncontrolled type 2 diabetes mellitus with hyperglycemia (CMS HCC)  Labs obtained in office today  Continue current meds along with diet blood sugar monitoring  Stage 3a chronic kidney disease (CMS HCC)  Renal function testing repeated today in lab  Hyperlipidemia, unspecified hyperlipidemia type  Continue Crestor 20 mg nightly along with diet  B12 deficiency  B12 injection given  Hypomagnesemia  Continue magnesium supplement  Chronic low back pain, unspecified back pain laterality, unspecified whether  sciatica present  On chronic pain meds  Continue Norco 7.5 mg q.8 hours-refills given  Chronic pain syndrome    Anxiety  Continue Valium 5 mg b.i.d.-refills  given  Depression, unspecified depression type  Stable on Effexor XR 37.5 mg daily  Vitamin D deficiency       Orders Placed This Encounter    URINE CULTURE,ROUTINE    MAGNESIUM    CBC/DIFF    COMPREHENSIVE METABOLIC PNL, FASTING    HGA1C (HEMOGLOBIN A1C WITH EST AVG GLUCOSE)    URINALYSIS, MACROSCOPIC AND MICROSCOPIC W/CULTURE REFLEX    LIPID PANEL    MICROALBUMIN/CREATININE RATIO, URINE, RANDOM    VITAMIN B12    IRON TRANSFERRIN AND TIBC    THYROID STIMULATING HORMONE WITH FREE T4 REFLEX    VITAMIN D 25 TOTAL    CBC WITH DIFF    URINALYSIS, MACROSCOPIC    URINALYSIS, MICROSCOPIC    SCAN DIFFERENTIAL    POCT URINE DIPSTICK    cyanocobalamin (VITAMIN B12) 1000 mcg/mL injection    ergocalciferol, vitamin D2, (DRISDOL) 1,250 mcg (50,000 unit) Oral Capsule    SUMAtriptan (IMITREX) 50 mg Oral Tablet    trimethoprim-sulfamethoxazole (BACTRIM DS) 160-800mg  per tablet         Follow up:   Post-Discharge Follow Up Appointments       Wednesday Feb 24, 2024    Return Patient Visit with Eyvonne Mechanic, PA-C at 11:30 AM      Wednesday Mar 23, 2024    Return Patient Visit with Eyvonne Mechanic, PA-C at  1:30 PM      Wednesday Apr 20, 2024    Return Patient Visit with Eyvonne Mechanic, PA-C at 11:30 AM      Monday Apr 25, 2024    Return Telephone Visit with Eyvonne Mechanic, PA-C at  1:30 PM      Wednesday May 18, 2024    Return Patient Visit with Eyvonne Mechanic, PA-C at 11:00 AM      Wednesday Jun 15, 2024    Return Patient Visit with Eyvonne Mechanic, PA-C at 11:00 AM      Internal Medicine, Building A  [facility]  [address]  [place] [address]  [phone]           Seek medical attention for new or worsening symptoms.  Patient has been seen in this clinic within the last 3 years.     Amy Hyman Bible, PA-C          I personally reviewed the documentation of care provided by the certified physician assistant and I agree with her medical decision making, Weyman Pedro, D.O.     This note was partially created using MModal Fluency Direct  system (voice recognition software) and is inherently subject to errors including those of syntax and "sound-alike" substitutions which may escape proofreading.  In such instances, original meaning may be extrapolated by contextual derivation.

## 2024-01-27 NOTE — Assessment & Plan Note (Signed)
Renal function testing repeated today in lab

## 2024-01-29 LAB — URINE CULTURE,ROUTINE: URINE CULTURE: NO GROWTH

## 2024-02-02 ENCOUNTER — Ambulatory Visit (INDEPENDENT_AMBULATORY_CARE_PROVIDER_SITE_OTHER): Payer: Self-pay

## 2024-02-08 ENCOUNTER — Ambulatory Visit (INDEPENDENT_AMBULATORY_CARE_PROVIDER_SITE_OTHER): Payer: Self-pay | Admitting: Physician Assistant

## 2024-02-24 ENCOUNTER — Ambulatory Visit (HOSPITAL_BASED_OUTPATIENT_CLINIC_OR_DEPARTMENT_OTHER)
Admission: RE | Admit: 2024-02-24 | Discharge: 2024-02-24 | Disposition: A | Discharge status: Home / Self Care | Source: Ambulatory Visit | Attending: Physician Assistant | Admitting: Physician Assistant

## 2024-02-24 ENCOUNTER — Other Ambulatory Visit (INDEPENDENT_AMBULATORY_CARE_PROVIDER_SITE_OTHER): Payer: Self-pay | Admitting: Physician Assistant

## 2024-02-24 ENCOUNTER — Encounter (INDEPENDENT_AMBULATORY_CARE_PROVIDER_SITE_OTHER): Payer: Self-pay | Admitting: Physician Assistant

## 2024-02-24 ENCOUNTER — Other Ambulatory Visit: Payer: Self-pay

## 2024-02-24 ENCOUNTER — Ambulatory Visit: Payer: Medicare Other | Attending: Physician Assistant | Admitting: Physician Assistant

## 2024-02-24 VITALS — BP 130/70 | HR 80 | Ht 65.0 in | Wt 192.0 lb

## 2024-02-24 DIAGNOSIS — M25512 Pain in left shoulder: Secondary | ICD-10-CM | POA: Insufficient documentation

## 2024-02-24 DIAGNOSIS — M25612 Stiffness of left shoulder, not elsewhere classified: Secondary | ICD-10-CM

## 2024-02-24 DIAGNOSIS — E538 Deficiency of other specified B group vitamins: Secondary | ICD-10-CM | POA: Insufficient documentation

## 2024-02-24 DIAGNOSIS — I10 Essential (primary) hypertension: Secondary | ICD-10-CM | POA: Insufficient documentation

## 2024-02-24 DIAGNOSIS — G8929 Other chronic pain: Secondary | ICD-10-CM | POA: Insufficient documentation

## 2024-02-24 DIAGNOSIS — Z9889 Other specified postprocedural states: Secondary | ICD-10-CM | POA: Insufficient documentation

## 2024-02-24 DIAGNOSIS — F419 Anxiety disorder, unspecified: Secondary | ICD-10-CM | POA: Insufficient documentation

## 2024-02-24 DIAGNOSIS — N183 Chronic kidney disease, stage 3 unspecified: Secondary | ICD-10-CM | POA: Insufficient documentation

## 2024-02-24 DIAGNOSIS — G894 Chronic pain syndrome: Secondary | ICD-10-CM | POA: Insufficient documentation

## 2024-02-24 DIAGNOSIS — M545 Low back pain, unspecified: Secondary | ICD-10-CM | POA: Insufficient documentation

## 2024-02-24 DIAGNOSIS — E1165 Type 2 diabetes mellitus with hyperglycemia: Secondary | ICD-10-CM | POA: Insufficient documentation

## 2024-02-24 DIAGNOSIS — N390 Urinary tract infection, site not specified: Secondary | ICD-10-CM | POA: Insufficient documentation

## 2024-02-24 DIAGNOSIS — F32A Depression, unspecified: Secondary | ICD-10-CM | POA: Insufficient documentation

## 2024-02-24 DIAGNOSIS — Z91199 Patient's noncompliance with other medical treatment and regimen due to unspecified reason: Secondary | ICD-10-CM | POA: Insufficient documentation

## 2024-02-24 LAB — URINALYSIS, MACROSCOPIC
BILIRUBIN: NEGATIVE mg/dL
BLOOD: NEGATIVE mg/dL
GLUCOSE: 1000 mg/dL — AB
LEUKOCYTES: 250 WBCs/uL — AB
NITRITE: NEGATIVE
PH: 5.5 (ref 5.0–9.0)
PROTEIN: 70 mg/dL — AB
SPECIFIC GRAVITY: 1.041 — ABNORMAL HIGH (ref 1.002–1.030)
UROBILINOGEN: 2 mg/dL — AB

## 2024-02-24 LAB — URINALYSIS, MICROSCOPIC
BACTERIA: NEGATIVE /HPF
RBCS: 24 /HPF — ABNORMAL HIGH (ref <4)
SQUAMOUS EPITHELIAL: 7 /HPF (ref <28)
WBCS: 45 /HPF — ABNORMAL HIGH (ref <6)

## 2024-02-24 MED ORDER — VENLAFAXINE ER 37.5 MG CAPSULE,EXTENDED RELEASE 24 HR
37.5000 mg | ORAL_CAPSULE | Freq: Every evening | ORAL | 1 refills | Status: DC
Start: 2024-02-24 — End: 2024-08-10

## 2024-02-24 MED ORDER — DIAZEPAM 5 MG TABLET
5.0000 mg | ORAL_TABLET | Freq: Two times a day (BID) | ORAL | 0 refills | Status: DC | PRN
Start: 2024-02-24 — End: 2024-03-23

## 2024-02-24 MED ORDER — SOLIFENACIN 5 MG TABLET: 5.0000 mg | ORAL_TABLET | Freq: Every day | ORAL | 1 refills | Status: DC

## 2024-02-24 MED ORDER — HYDROCODONE 7.5 MG-ACETAMINOPHEN 325 MG TABLET
1.0000 | ORAL_TABLET | Freq: Three times a day (TID) | ORAL | 0 refills | Status: DC | PRN
Start: 2024-02-24 — End: 2024-03-23

## 2024-02-24 NOTE — Assessment & Plan Note (Signed)
 Continue Valium 5 mg b.i.d.-refills given

## 2024-02-24 NOTE — Assessment & Plan Note (Signed)
 Patient referred to endocrinologist  Continue current meds as well as addition of Ozempic with Sample of Ozempic 0.25 mg weekly given with 1st shot obtained in office today

## 2024-02-24 NOTE — Assessment & Plan Note (Signed)
 B12 injection given

## 2024-02-24 NOTE — Assessment & Plan Note (Signed)
 Urine sent for culture.

## 2024-02-24 NOTE — Assessment & Plan Note (Signed)
Labs obtained in follow-up

## 2024-02-24 NOTE — Progress Notes (Signed)
 [facility]ICINE[facility]  [address]  [place] [address]          Follow Up        Reason for Visit: Follow Up (Routine 1 month ) shoulder pain    History of Present Illness  Grace Hoffman is a 73 y.o. female who is being seen today in office for follow-up however has complaints of increased left shoulder pain with limited range of motion.  Patient has a history of a rotator cuff injury previously with repair noted.  She states recently her shoulder has been "killing her".  She is unable to sleep because of the pain and states any pressure leg lying on her left side increases the pain.  She also has trouble reaching behind her back and at times over her head because of the pain once again she has a history of a rotator cuff repair she states done by Dr. Vear Clock in [place].  She has not seen him for her shoulder and sometime but states she can make an appointment.  No recent fall or injury noted.    Patient also has some urinary frequency pressure with a history of recurrent UTIs noted.  Requesting urinalysis while here today.  No fever chills noted.    F/u  blood pressure/hypertension with chronic dizziness noted.  Blood pressure today 130/70 and patient is no longer on blood pressure medication.  Previously she has been taking Vasotec 10 mg daily as well as hydrochlorothiazide 25 mg daily p.r.n.Marland Kitchen  At this time denies headaches dizziness chest pain.  She has been seen by neurologist in the past with MRI of her brain noting signs of TIAs.  She has also used physical therapy for the dizziness and gait issues which seemed to help.  She once again had stated she notices the dizziness is worse if her sugar is low so thought possibly hypoglycemia could be related.  She also has a history of Occasional headaches noted as well as misalignment of ear Crystal's and has been seen by the ENT where she had her crystals realigned.       F/U BS w history of uncontrolled diabetes with last hemoglobin 11.9.   Patient has a history of poor compliance on along with diet along with medication therapy and has been referred to the endocrinologist in the past but is no longer seeing 1.  Currently patient is on metformin at 1000 mg twice daily along with Jardiance 25 mg daily Toujeo 35 units nightly and Humalog 5 units with meals.  Noted she was unable to tolerate Mounjaro due to GI side effects but does not think she has ever tried Ozempic.   Denies increased thirst or urination.  She does have a history of chronic kidney disease  however renal function overall stable with last BUN 18 creatinine 1.08  GFR 54.     Follow-up B12 deficiency with chronic fatigue issues noted.  Patient is requesting B12 injection today which do seem to help with her energy level.  She also has a history of low magnesium however denies cramping at this time.      Follow-up chronic pain/DJD/fibromyalgia for which she has been on chronic pain therapy/narcotics for numerous years.  Patient had reported increased pain in her lower back with x-rays showing moderate DJD the MRI of spine ordered.  She did have an MRI was referred to [facility] neurosurgeon and had an appointment on December 17, 2023 to obtain injections in her back area.  She also is currently taking Norco 7.5 mg q.6 hours p.r.n. which does help with her chronic pain issues.  She denies any side effects to medication and compliant w urine drug screen.  Requesting medication refill.     Follow-up anxiety/depression with patient currently on Valium 5 mg b.i.d. p.r.n. as well as Effexor 37.5 mg daily.  Patient overall doing well at current doses.  Patient denies panic attack and states anxiety depression overall stable at this current time.     Flu shot up to date      PHQ Questionnaire  Little interest or pleasure in doing things.: Not at all  Feeling down, depressed, or hopeless: Not at all  PHQ 2 Total: 0            Past Medical History:   Diagnosis Date    Anxiety     Chronic back pain      COVID-19 vaccine series completed     Depression     Diabetes mellitus, type 2 (CMS HCC)     Fibromyalgia affecting multiple sites     Hypertension     Hypomagnesemia     Insomnia disorder     Migraine     Mixed hyperlipidemia     Orthostatic hypotension     Osteoarthritis     TIA (transient ischemic attack)     Unspecified glaucoma(365.9)     Vitamin D deficiency          Past Surgical History:   Procedure Laterality Date    HX CHOLECYSTECTOMY      HX KNEE REPLACMENT Left     HX ROTATOR CUFF REPAIR Left     SKIN CANCER EXCISION      forehead removed      Family Medical History:       Problem Relation (Age of Onset)    Kidney failure Mother    No Known Problems Sister, Brother, Half-Sister, Half-Brother, Maternal Aunt, Maternal Uncle, Paternal Aunt, Paternal Uncle, Maternal Grandmother, Maternal Grandfather, Paternal Grandmother, Paternal Grandfather, Daughter, Son    Respiratory Problems Father            Social History     Tobacco Use    Smoking status: Never    Smokeless tobacco: Never   Vaping Use    Vaping status: Never Used   Substance Use Topics    Alcohol use: Never    Drug use: Never     Medication:  d-mannose 500 mg Oral Capsule, Take 1 Capsule (500 mg total) by mouth Twice daily  empagliflozin (JARDIANCE) 25 mg Oral Tablet, Take 1 Tablet (25 mg total) by mouth Once a day  ergocalciferol, vitamin D2, (DRISDOL) 1,250 mcg (50,000 unit) Oral Capsule, Take 1 Capsule (50,000 Units total) by mouth Every 7 days  HYDROcodone-acetaminophen (NORCO) 7.5-325 mg Oral Tablet, Take 1 Tablet by mouth Every 8 hours as needed for up to 30 days Indications: pain  insulin glargine U-300 conc (TOUJEO MAX U-300 SOLOSTAR) 300 unit/mL (3 mL) Subcutaneous Insulin Pen, Inject 35 Units under the skin Every night  insulin lispro (HUMALOG KWIKPEN INSULIN) 100 unit/mL Subcutaneous Insulin Pen, Use as directed sliding scale; also use 5 units with meals Indications: type 2 diabetes mellitus  latanoprost (XALATAN) 0.005 % Ophthalmic  Drops, Administer 1 Drop into the left eye Every night  Levetiracetam 500 mg Oral Tablet Sustained Release 24 hr, Take 1 Tablet (500 mg total) by mouth Every night  magnesium oxide (MAG-OX) 400 mg Oral Tablet, Take 1 Tablet (400  mg total) by mouth Twice daily  MetFORMIN (GLUCOPHAGE) 1,000 mg Oral Tablet, Take 1 Tablet (1,000 mg total) by mouth Twice daily with food  rosuvastatin (CRESTOR) 20 mg Oral Tablet, Take 1 Tablet (20 mg total) by mouth Every evening  SUMAtriptan (IMITREX) 50 mg Oral Tablet, Take 1 Tablet (50 mg total) by mouth Once, as needed for Migraine TAKE 1 TABLET BY MOUTH AT ONSET OF HEADACHE. IF NO RELIEF MAY REPEAT 1 AFTER AT LEAST 2 HOURS. MAX OF 4 TABS PER 24 HOURS  diazePAM (VALIUM) 5 mg Oral Tablet, Take 1 Tablet (5 mg total) by mouth Every 12 hours as needed for Anxiety  enalapril maleate (VASOTEC) 10 mg Oral Tablet, Take 1 Tablet (10 mg total) by mouth Once a day for 180 days  loratadine (CLARITIN) 10 mg Oral Tablet, Take 1 Tablet (10 mg total) by mouth Once per day as needed  solifenacin (VESICARE) 5 mg Oral Tablet, Take 1 Tablet (5 mg total) by mouth Once a day  venlafaxine (EFFEXOR XR) 37.5 mg Oral Capsule, Sust. Release 24 hr, Take 1 Capsule (37.5 mg total) by mouth Every night    No facility-administered medications prior to visit.    Allergies:  Allergies   Allergen Reactions    Codeine Nausea/ Vomiting    Amoxicillin Diarrhea    Macrobid [Nitrofurantoin Monohyd/M-Cryst] Nausea/ Vomiting       Physical Exam:  Vitals:    02/24/24 1201   BP: 130/70   Pulse: 80   SpO2: 96%   Weight: 87.1 kg (192 lb)   Height: 1.651 m (5\' 5" )   BMI: 31.95      Patient is overweight  General appearance: alert, cooperative, in no acute distress  HEENT: Ears: clear, no effusion, no erythema; Throat: non-erythematous; no LAD, neck supple, moist mucus membranes  Lungs: clear to auscultation bilaterally; no wheezes or rhonchi   Heart: regular rate and rhythm; normal S1 & S2; no murmur  Abdomen: soft, non-tender,  obese not distended; bowel sounds present  No CVA tenderness noted  Extremities:  Limited abduction overhead as well as posterior range of motion left shoulder due to pain  Decreased flexion extension lower back due to pain  Negative straight leg raises  Gait stable   no cyanosis or edema , no rash  Psych: alert and oriented x 3  Neuro: CN 2-12 grossly intact    Assessment & Plan  Left shoulder pain, unspecified chronicity  Patient to call orthopedic for appointment  Will get x-ray of left shoulder  Decreased abduction of left shoulder joint  MRI of left shoulder ordered also  Hx of repair of left rotator cuff    Recurrent UTI  Urine sent for culture  Essential hypertension  Blood pressure stable  Continue monitoring  Uncontrolled type 2 diabetes mellitus with hyperglycemia (CMS HCC)  Patient referred to endocrinologist  Continue current meds as well as addition of Ozempic with Sample of Ozempic 0.25 mg weekly given with 1st shot obtained in office today  Stage 3 chronic kidney disease, unspecified whether stage 3a or 3b CKD (CMS HCC)  Labs obtained in follow-up  B12 deficiency  B12 injection given  Chronic low back pain, unspecified back pain laterality, unspecified whether sciatica present  On chronic pain management  Continue Norco 7.5 mg every 8 hours-refills given  Chronic pain syndrome    Anxiety  Continue Valium 5 mg b.i.d.-refills given  Depression, unspecified depression type  Continue Effexor XR 37.5 mg daily  Patient noncompliance  Orders Placed This Encounter    URINE CULTURE,ROUTINE    XR SHOULDER LEFT    MRI SHOULDER LEFT W/WO CONTRAST    URINALYSIS, MACROSCOPIC AND MICROSCOPIC W/CULTURE REFLEX    URINALYSIS, MACROSCOPIC    URINALYSIS, MICROSCOPIC    Referral to External Provider (AMB)    venlafaxine (EFFEXOR XR) 37.5 mg Oral Capsule, Sust. Release 24 hr    solifenacin (VESICARE) 5 mg Oral Tablet    diazePAM (VALIUM) 5 mg Oral Tablet           Follow up:   Post-Discharge Follow Up Appointments        Wednesday Mar 23, 2024    Return Patient Visit with Eyvonne Mechanic, PA-C at  1:30 PM      Wednesday Apr 20, 2024    Return Patient Visit with Eyvonne Mechanic, PA-C at 11:30 AM      Monday Apr 25, 2024    Return Telephone Visit with Eyvonne Mechanic, PA-C at  1:30 PM      Wednesday May 18, 2024    Return Patient Visit with Eyvonne Mechanic, PA-C at 11:00 AM      Wednesday Jun 15, 2024    Return Patient Visit with Eyvonne Mechanic, PA-C at 11:00 AM      Internal Medicine, Building [facility]  [facility]  [address]  [place] [address]  [phone]           Seek medical attention for new or worsening symptoms.  Patient has been seen in this clinic within the last 3 years.     Amy Hyman Bible, PA-C        I personally reviewed the documentation of care provided by the certified physician assistant and I agree with her medical decision making, Weyman Pedro, D.O.     This note was partially created using MModal Fluency Direct system (voice recognition software) and is inherently subject to errors including those of syntax and "sound-alike" substitutions which may escape proofreading.  In such instances, original meaning may be extrapolated by contextual derivation.

## 2024-02-24 NOTE — Telephone Encounter (Signed)
 1 month follow up medication refill.

## 2024-02-24 NOTE — Assessment & Plan Note (Signed)
Blood pressure stable  Continue monitoring

## 2024-02-24 NOTE — Assessment & Plan Note (Signed)
Continue Effexor XR 37.5 mg daily

## 2024-02-24 NOTE — Nursing Note (Signed)
Patient is here for routine 1 month follow up for medication refills.

## 2024-02-24 NOTE — Assessment & Plan Note (Signed)
 On chronic pain management  Continue Norco 7.5 mg every 8 hours-refills given

## 2024-02-25 ENCOUNTER — Telehealth (INDEPENDENT_AMBULATORY_CARE_PROVIDER_SITE_OTHER): Payer: Self-pay | Admitting: Physician Assistant

## 2024-02-25 DIAGNOSIS — M19012 Primary osteoarthritis, left shoulder: Secondary | ICD-10-CM

## 2024-02-25 DIAGNOSIS — M25512 Pain in left shoulder: Secondary | ICD-10-CM

## 2024-02-26 ENCOUNTER — Ambulatory Visit (INDEPENDENT_AMBULATORY_CARE_PROVIDER_SITE_OTHER): Payer: Self-pay

## 2024-02-26 LAB — URINE CULTURE,ROUTINE: URINE CULTURE: 10000 — AB

## 2024-02-26 MED ORDER — CIPROFLOXACIN 500 MG TABLET
500.0000 mg | ORAL_TABLET | Freq: Two times a day (BID) | ORAL | 0 refills | Status: DC
Start: 2024-02-26 — End: 2024-03-23

## 2024-02-26 NOTE — Telephone Encounter (Signed)
-----   Message from Eyvonne Mechanic, [place] sent at 02/26/2024  8:30 AM EST -----  Give her Cipro or Bactrim whichever 1 she can tolerate will await culture

## 2024-03-03 ENCOUNTER — Telehealth (INDEPENDENT_AMBULATORY_CARE_PROVIDER_SITE_OTHER): Payer: Self-pay | Admitting: Physician Assistant

## 2024-03-07 ENCOUNTER — Ambulatory Visit (INDEPENDENT_AMBULATORY_CARE_PROVIDER_SITE_OTHER): Payer: Self-pay | Admitting: Physician Assistant

## 2024-03-18 ENCOUNTER — Other Ambulatory Visit: Payer: Self-pay

## 2024-03-18 ENCOUNTER — Other Ambulatory Visit (INDEPENDENT_AMBULATORY_CARE_PROVIDER_SITE_OTHER): Payer: Self-pay | Admitting: Physician Assistant

## 2024-03-18 ENCOUNTER — Ambulatory Visit
Admission: RE | Admit: 2024-03-18 | Discharge: 2024-03-18 | Disposition: A | Discharge status: Home / Self Care | Payer: Self-pay | Source: Ambulatory Visit | Attending: Physician Assistant | Admitting: Physician Assistant

## 2024-03-18 DIAGNOSIS — G894 Chronic pain syndrome: Secondary | ICD-10-CM

## 2024-03-18 DIAGNOSIS — Z9889 Other specified postprocedural states: Secondary | ICD-10-CM

## 2024-03-18 DIAGNOSIS — M25612 Stiffness of left shoulder, not elsewhere classified: Secondary | ICD-10-CM

## 2024-03-18 DIAGNOSIS — N390 Urinary tract infection, site not specified: Secondary | ICD-10-CM

## 2024-03-18 DIAGNOSIS — N183 Chronic kidney disease, stage 3 unspecified: Secondary | ICD-10-CM

## 2024-03-18 DIAGNOSIS — Z91199 Patient's noncompliance with other medical treatment and regimen due to unspecified reason: Secondary | ICD-10-CM

## 2024-03-18 DIAGNOSIS — M25512 Pain in left shoulder: Secondary | ICD-10-CM | POA: Insufficient documentation

## 2024-03-18 DIAGNOSIS — M545 Low back pain, unspecified: Secondary | ICD-10-CM

## 2024-03-18 DIAGNOSIS — E538 Deficiency of other specified B group vitamins: Secondary | ICD-10-CM

## 2024-03-18 DIAGNOSIS — F419 Anxiety disorder, unspecified: Secondary | ICD-10-CM

## 2024-03-18 DIAGNOSIS — E1165 Type 2 diabetes mellitus with hyperglycemia: Secondary | ICD-10-CM

## 2024-03-18 DIAGNOSIS — F32A Depression, unspecified: Secondary | ICD-10-CM

## 2024-03-18 DIAGNOSIS — I10 Essential (primary) hypertension: Secondary | ICD-10-CM

## 2024-03-21 ENCOUNTER — Ambulatory Visit (INDEPENDENT_AMBULATORY_CARE_PROVIDER_SITE_OTHER): Payer: Self-pay

## 2024-03-23 ENCOUNTER — Ambulatory Visit: Payer: Self-pay | Attending: Physician Assistant | Admitting: Physician Assistant

## 2024-03-23 ENCOUNTER — Other Ambulatory Visit (INDEPENDENT_AMBULATORY_CARE_PROVIDER_SITE_OTHER): Payer: Self-pay | Admitting: Physician Assistant

## 2024-03-23 ENCOUNTER — Encounter (INDEPENDENT_AMBULATORY_CARE_PROVIDER_SITE_OTHER): Payer: Self-pay | Admitting: Physician Assistant

## 2024-03-23 ENCOUNTER — Other Ambulatory Visit: Payer: Self-pay

## 2024-03-23 VITALS — BP 138/72 | HR 86 | Ht 65.0 in | Wt 186.0 lb

## 2024-03-23 DIAGNOSIS — F419 Anxiety disorder, unspecified: Secondary | ICD-10-CM | POA: Insufficient documentation

## 2024-03-23 DIAGNOSIS — M25512 Pain in left shoulder: Secondary | ICD-10-CM | POA: Insufficient documentation

## 2024-03-23 DIAGNOSIS — E538 Deficiency of other specified B group vitamins: Secondary | ICD-10-CM | POA: Insufficient documentation

## 2024-03-23 DIAGNOSIS — E1165 Type 2 diabetes mellitus with hyperglycemia: Secondary | ICD-10-CM | POA: Insufficient documentation

## 2024-03-23 DIAGNOSIS — G8929 Other chronic pain: Secondary | ICD-10-CM | POA: Insufficient documentation

## 2024-03-23 DIAGNOSIS — G894 Chronic pain syndrome: Secondary | ICD-10-CM | POA: Insufficient documentation

## 2024-03-23 DIAGNOSIS — N183 Chronic kidney disease, stage 3 unspecified: Secondary | ICD-10-CM | POA: Insufficient documentation

## 2024-03-23 DIAGNOSIS — I10 Essential (primary) hypertension: Secondary | ICD-10-CM | POA: Insufficient documentation

## 2024-03-23 DIAGNOSIS — M545 Low back pain, unspecified: Secondary | ICD-10-CM | POA: Insufficient documentation

## 2024-03-23 DIAGNOSIS — S43432D Superior glenoid labrum lesion of left shoulder, subsequent encounter: Secondary | ICD-10-CM | POA: Insufficient documentation

## 2024-03-23 MED ORDER — ERGOCALCIFEROL (VITAMIN D2) 1,250 MCG (50,000 UNIT) CAPSULE
50000.0000 [IU] | ORAL_CAPSULE | ORAL | 1 refills | Status: DC
Start: 2024-03-23 — End: 2024-05-12

## 2024-03-23 MED ORDER — CYANOCOBALAMIN (VIT B-12) 1,000 MCG/ML INJECTION SOLUTION
1000.0000 ug | INTRAMUSCULAR | Status: AC
Start: 2024-03-23 — End: 2024-03-23
  Administered 2024-03-23: 1000 ug via SUBCUTANEOUS

## 2024-03-23 MED ORDER — FREESTYLE LIBRE 3 SENSOR DEVICE
1.0000 | 5 refills | Status: DC
Start: 2024-03-23 — End: 2024-09-28

## 2024-03-23 MED ORDER — FREESTYLE LIBRE 3 READER: 1.0000 | Freq: Four times a day (QID) | 1 refills | Status: DC | PRN

## 2024-03-23 MED ORDER — DIAZEPAM 5 MG TABLET
5.0000 mg | ORAL_TABLET | Freq: Two times a day (BID) | ORAL | 0 refills | Status: DC | PRN
Start: 2024-03-23 — End: 2024-04-20

## 2024-03-23 MED ORDER — ONDANSETRON 4 MG DISINTEGRATING TABLET
4.0000 mg | ORAL_TABLET | Freq: Three times a day (TID) | ORAL | 1 refills | Status: DC | PRN
Start: 2024-03-23 — End: 2024-10-10

## 2024-03-23 NOTE — Assessment & Plan Note (Signed)
 Continue topical anti-inflammatories along with heat

## 2024-03-23 NOTE — Assessment & Plan Note (Signed)
Patient referred to orthopedic

## 2024-03-23 NOTE — Assessment & Plan Note (Signed)
 Continue blood pressure monitoring.

## 2024-03-23 NOTE — Assessment & Plan Note (Signed)
 Kidney function stable at this time  Will follow-up labs next visit

## 2024-03-23 NOTE — Assessment & Plan Note (Signed)
 B12 injection given

## 2024-03-23 NOTE — Assessment & Plan Note (Signed)
 Patient to continue the Ozempic at 0.25 mg weekly x2 more injections then increase to 0.5 mg weekly as discussed  Continue strict diet along with blood sugar monitoring and current medications

## 2024-03-23 NOTE — Assessment & Plan Note (Signed)
 Continue Valium  5 mg b.i.d. PRN-refills given

## 2024-03-23 NOTE — Telephone Encounter (Signed)
 1 month follow up for medication refill.

## 2024-03-23 NOTE — Nursing Note (Signed)
Patient is here for routine 1 month follow up for medication refills.

## 2024-03-23 NOTE — Assessment & Plan Note (Signed)
 On chronic pain meds  Continue Norco 7.5 mg q.8 hours PRN-refills given

## 2024-03-23 NOTE — Progress Notes (Signed)
 [facility]  [address]  [place] [address]          Follow Up        Reason for Visit: Follow Up (Routine 1 month )    History of Present Illness  Grace Hoffman is a 73 y.o. female who is being seen today in office for follow-up as well as discussion of recent MRI of her left shoulder.  It was noted MRI revealed:    GLENOHUMERAL JOINT OSTEOARTHRITIS WITH DEGENERATIVE TEARING OF THE GLENOID LABRUM ASSOCIATED WITH A SMALL JOINT EFFUSION.     MILD ACROMIOCLAVICULAR JOINT DEGENERATIVE CHANGE AND MILD SUBACROMIAL SUBDELTOID BURSITIS.    Patient states that she is in severe pain all the time with her shoulder even with use of her Norco.  She is using topical anti-inflammatories but any slight movement increases the pain.  She has been seen by orthopedic in [facility] in years past with previous rotator cuff injuries noted.  She would like to go back to him in requests referral.    F/u  blood pressure/hypertension with chronic dizziness noted.  Blood pressure today 138/72 and patient is no longer on blood pressure medication.  Previously she has been taking Vasotec  10 mg daily as well as hydrochlorothiazide  25 mg daily p.r.n.Aaron Aas  At this time denies headaches dizziness chest pain.  She has been seen by neurologist in the past with MRI of her brain noting signs of TIAs.  She has also used physical therapy for the dizziness and gait issues which seemed to help.  She once again had stated she notices the dizziness is worse if her sugar is low so thought possibly hypoglycemia could be related.  She also has a history of Occasional headaches noted as well as misalignment of ear Crystal's and has been seen by the ENT where she had her crystals realigned.       F/U BS w history of uncontrolled diabetes with last hemoglobin 11.9.  A1c has been significantly better previously 7.5 and once again Patient has a history of poor compliance on along with diet along with medication therapy and has been  referred to the endocrinologist in the past but is no longer seeing 1.  Currently patient is on metformin  at 1000 mg twice daily along with Jardiance  25 mg daily Toujeo  35 units nightly and Humalog  5 units with meals.  Noted she was unable to tolerate Mounjaro  due to GI side effects but agreed to a trial of Ozempic which she is currently on 0.25 mg weekly in his had 2 injections so far tolerated.  She states it has helped her sugar which seems to be running less than 150 majority of the time.   Denies increased thirst or urination.  She does have a history of chronic kidney disease  however renal function overall stable with last BUN 18 creatinine 1.08  GFR 54.     Follow-up B12 deficiency with chronic fatigue issues noted.  Patient is requesting B12 injection today which do seem to help with her energy level.  She also has a history of low magnesium  however denies cramping at this time.      Follow-up chronic pain/DJD/fibromyalgia for which she has been on chronic pain therapy/narcotics for numerous years.  Patient had reported increased pain in her lower back with x-rays showing moderate DJD the MRI of spine ordered.  She did have an MRI was referred to [facility] neurosurgeon and had an appointment on December 17, 2023 to  obtain injections in her back area.  She also is currently taking Norco 7.5 mg q.6 hours p.r.n. which does help with her chronic pain issues.  She denies any side effects to medication and compliant w urine drug screen.  Requesting medication refill.     Follow-up anxiety/depression with patient currently on Valium  5 mg b.i.d. p.r.n. as well as Effexor  37.5 mg daily.  Patient overall doing well at current doses.  Patient denies panic attack and states anxiety depression overall stable at this current time.     Flu shot up to date          Past Medical History:   Diagnosis Date    Anxiety     Chronic back pain     COVID-19 vaccine series completed     Depression     Diabetes mellitus, type 2      Fibromyalgia affecting multiple sites     Hypertension     Hypomagnesemia     Insomnia disorder     Migraine     Mixed hyperlipidemia     Orthostatic hypotension     Osteoarthritis     TIA (transient ischemic attack)     Unspecified glaucoma(365.9)     Vitamin D  deficiency          Past Surgical History:   Procedure Laterality Date    HX CHOLECYSTECTOMY      HX KNEE REPLACMENT Left     HX ROTATOR CUFF REPAIR Left     SKIN CANCER EXCISION      forehead removed      Family Medical History:       Problem Relation (Age of Onset)    Kidney failure Mother    No Known Problems Sister, Brother, Half-Sister, Half-Brother, Maternal Aunt, Maternal Uncle, Paternal Aunt, Paternal Uncle, Maternal Grandmother, Maternal Grandfather, Paternal Grandmother, Paternal Grandfather, Daughter, Son    Respiratory Problems Father            Social History     Tobacco Use    Smoking status: Never    Smokeless tobacco: Never   Vaping Use    Vaping status: Never Used   Substance Use Topics    Alcohol use: Never    Drug use: Never     Medication:  d-mannose 500 mg Oral Capsule, Take 1 Capsule (500 mg total) by mouth Twice daily  empagliflozin  (JARDIANCE ) 25 mg Oral Tablet, Take 1 Tablet (25 mg total) by mouth Once a day  HYDROcodone -acetaminophen  (NORCO) 7.5-325 mg Oral Tablet, Take 1 Tablet by mouth Every 8 hours as needed for up to 30 days Indications: pain  insulin  glargine U-300 conc (TOUJEO  MAX U-300 SOLOSTAR) 300 unit/mL (3 mL) Subcutaneous Insulin  Pen, Inject 35 Units under the skin Every night  insulin  lispro (HUMALOG  KWIKPEN INSULIN ) 100 unit/mL Subcutaneous Insulin  Pen, Use as directed sliding scale; also use 5 units with meals Indications: type 2 diabetes mellitus  latanoprost  (XALATAN ) 0.005 % Ophthalmic Drops, Administer 1 Drop into the left eye Every night  Levetiracetam  500 mg Oral Tablet Sustained Release 24 hr, Take 1 Tablet (500 mg total) by mouth Every night  magnesium  oxide (MAG-OX) 400 mg Oral Tablet, Take 1 Tablet (400 mg  total) by mouth Twice daily  MetFORMIN  (GLUCOPHAGE ) 1,000 mg Oral Tablet, Take 1 Tablet (1,000 mg total) by mouth Twice daily with food  rosuvastatin  (CRESTOR ) 20 mg Oral Tablet, Take 1 Tablet (20 mg total) by mouth Every evening  solifenacin  (VESICARE ) 5 mg Oral Tablet, Take 1  Tablet (5 mg total) by mouth Once a day  SUMAtriptan  (IMITREX ) 50 mg Oral Tablet, Take 1 Tablet (50 mg total) by mouth Once, as needed for Migraine TAKE 1 TABLET BY MOUTH AT ONSET OF HEADACHE. IF NO RELIEF MAY REPEAT 1 AFTER AT LEAST 2 HOURS. MAX OF 4 TABS PER 24 HOURS  venlafaxine  (EFFEXOR  XR) 37.5 mg Oral Capsule, Sust. Release 24 hr, Take 1 Capsule (37.5 mg total) by mouth Every night  ciprofloxacin  HCl (CIPRO ) 500 mg Oral Tablet, Take 1 Tablet (500 mg total) by mouth Twice daily for 10 days  diazePAM  (VALIUM ) 5 mg Oral Tablet, Take 1 Tablet (5 mg total) by mouth Every 12 hours as needed for Anxiety  ergocalciferol , vitamin D2, (DRISDOL ) 1,250 mcg (50,000 unit) Oral Capsule, Take 1 Capsule (50,000 Units total) by mouth Every 7 days    No facility-administered medications prior to visit.    Allergies:  Allergies   Allergen Reactions    Codeine Nausea/ Vomiting    Amoxicillin Diarrhea    Macrobid  [Nitrofurantoin  Monohyd/M-Cryst] Nausea/ Vomiting       Physical Exam:  Vitals:    03/23/24 1323   BP: 138/72   Pulse: 86   SpO2: 96%   Weight: 84.4 kg (186 lb)   Height: 1.651 m (5\' 5" )   BMI: 30.95      Patient is overweight  General appearance: alert, cooperative, in no acute distress  HEENT: Ears: clear, no effusion, no erythema; Throat: non-erythematous; no LAD, neck supple, moist mucus membranes  Lungs: clear to auscultation bilaterally; no wheezes or rhonchi   Heart: regular rate and rhythm; normal S1 & S2; no murmur  Abdomen: soft, non-tender, obese not distended; bowel sounds present  No CVA tenderness noted  Extremities:  Limited abduction overhead as well as posterior range of motion left shoulder due to pain; she also has tenderness on  palpation of the left AC joint area  Decreased flexion extension lower back due to pain  Negative straight leg raises  Gait stable  Assessment & Plan  Left shoulder pain, unspecified chronicity  Continue topical anti-inflammatories along with heat    Glenoid labrum tear, left, subsequent encounter  Patient referred to orthopedic  Essential hypertension  Continue blood pressure monitoring  Uncontrolled type 2 diabetes mellitus with hyperglycemia (CMS HCC)  Patient to continue the Ozempic at 0.25 mg weekly x2 more injections then increase to 0.5 mg weekly as discussed  Continue strict diet along with blood sugar monitoring and current medications  Stage 3 chronic kidney disease, unspecified whether stage 3a or 3b CKD (CMS HCC)  Kidney function stable at this time  Will follow-up labs next visit  B12 deficiency  B12 injection given  Chronic low back pain, unspecified back pain laterality, unspecified whether sciatica present  On chronic pain meds  Continue Norco 7.5 mg q.8 hours PRN-refills given  Chronic pain syndrome    Anxiety  Continue Valium  5 mg b.i.d. PRN-refills given  Depression, unspecified depression type  Stable on Effexor  37.5 mg daily     Orders Placed This Encounter    Referral to External Provider (AMB)    diazePAM  (VALIUM ) 5 mg Oral Tablet    ergocalciferol , vitamin D2, (DRISDOL ) 1,250 mcg (50,000 unit) Oral Capsule    ondansetron  (ZOFRAN  ODT) 4 mg Oral Tablet, Rapid Dissolve    cyanocobalamin  (VITAMIN B12) 1000 mcg/mL injection    Blood-Glucose Sensor (FREESTYLE LIBRE 3 SENSOR) Does not apply Device    Blood-Glucose Meter,Continuous (FREESTYLE LIBRE 3 READER) Does  not apply Misc         Follow up:   Post-Discharge Follow Up Appointments       Wednesday Apr 13, 2024    Appointment with Sahyouni, Jamal, MD at  1:00 PM      Wednesday Apr 20, 2024    Return Patient Visit with Cloyce Darby, PA-C at 11:30 AM      Monday Apr 25, 2024    Return Telephone Visit with Cloyce Darby, PA-C at  1:30 PM      Wednesday  May 18, 2024    Return Patient Visit with Cloyce Darby, PA-C at 11:00 AM      Wednesday Jun 15, 2024    Return Patient Visit with Cloyce Darby, PA-C at 11:00 AM      [facility]  [facility]  [address]  [place] [address]  [phone]           Seek medical attention for new or worsening symptoms.  Patient has been seen in this clinic within the last 3 years.     Amy Graylin Lea, PA-C      I personally reviewed the documentation of care provided by the certified physician assistant and I agree with her medical decision making, Reeda Canner, D.O.       This note was partially created using MModal Fluency Direct system (voice recognition software) and is inherently subject to errors including those of syntax and "sound-alike" substitutions which may escape proofreading.  In such instances, original meaning may be extrapolated by contextual derivation.

## 2024-03-24 ENCOUNTER — Other Ambulatory Visit (INDEPENDENT_AMBULATORY_CARE_PROVIDER_SITE_OTHER): Payer: Self-pay

## 2024-03-24 ENCOUNTER — Encounter (INDEPENDENT_AMBULATORY_CARE_PROVIDER_SITE_OTHER): Payer: Self-pay

## 2024-03-24 DIAGNOSIS — Z7189 Other specified counseling: Secondary | ICD-10-CM

## 2024-03-24 DIAGNOSIS — E1165 Type 2 diabetes mellitus with hyperglycemia: Secondary | ICD-10-CM

## 2024-03-24 MED ORDER — HYDROCODONE 7.5 MG-ACETAMINOPHEN 325 MG TABLET
1.0000 | ORAL_TABLET | Freq: Three times a day (TID) | ORAL | 0 refills | Status: DC | PRN
Start: 2024-03-24 — End: 2024-04-20

## 2024-03-24 NOTE — Progress Notes (Signed)
 Care Coordination Identified Note    DATE: 03/24/2024, 15:01  PATIENT NAME: Grace Hoffman  MRN: [id number]  DOB: [birth year]  PCP: Cloyce Darby, PA-C  INSURANCE: Payor: MEDICARE / Plan: MEDICARE PART A AND B / Product Type: Medicare /     Received referral for Diabetes Disease Management from Leon Rajas, RN. The patient has been marked as identified at this time for potential enrollment. Chart review completed.  A future outgoing call will be made to patient within 48 hours to discuss Care Coordination and obtain verbal consent if interested.     Brendalyn Calkins, [facility]  03/24/2024  15:01

## 2024-03-28 ENCOUNTER — Other Ambulatory Visit (INDEPENDENT_AMBULATORY_CARE_PROVIDER_SITE_OTHER): Payer: Self-pay

## 2024-03-28 DIAGNOSIS — Z7189 Other specified counseling: Secondary | ICD-10-CM

## 2024-03-28 NOTE — Progress Notes (Signed)
 Diabetes Outreach-attempt 1    I reviewed patients chart and left a VM with my call back number to see if patient would like to enroll in diabetes disease management program.  I will try patient again in a couple days.    In January CGM was placed with [facility], check to see if patient is using. The only diabetes medication that I see dispensed by a pharmacy  in 2025 is Metformin. She maybe getting Jardiance free from [facility].       Active Diabetes Meds   Medication Sig    Blood-Glucose Meter,Continuous (FREESTYLE LIBRE 3 READER) Does not apply Misc 1 Each Every 6 hours as needed    Blood-Glucose Sensor (FREESTYLE LIBRE 3 SENSOR) Does not apply Device 1 Device Every 14 days    empagliflozin (JARDIANCE) 25 mg Oral Tablet Take 1 Tablet (25 mg total) by mouth Once a day    insulin glargine U-300 conc (TOUJEO MAX U-300 SOLOSTAR) 300 unit/mL (3 mL) Subcutaneous Insulin Pen Inject 35 Units under the skin Every night    insulin lispro (HUMALOG KWIKPEN INSULIN) 100 unit/mL Subcutaneous Insulin Pen Use as directed sliding scale; also use 5 units with meals Indications: type 2 diabetes mellitus    MetFORMIN (GLUCOPHAGE) 1,000 mg Oral Tablet Take 1 Tablet (1,000 mg total) by mouth Twice daily with food       Diabetes Monitors  A1C: 11.9  A1C Date: 01/27/2024  Kidney Health:   Urine Microalbumin/Cr Ratio--14.4 Ur Microalb/Cr Ratio date--01/27/2024   eGFR --70 eGFR date--01/27/2024    Last Lipid Panel  (Last result in the past 2 years)        Cholesterol   HDL   LDL   Direct LDL   Triglycerides      01/27/24 1356 234   41   123     351            Retinal Exam Date: 11/12/2023 retinopathy status not documented  Last Foot Exam: 06/03/2022      Merideth Abbey, PHARMD  [phone]

## 2024-03-30 ENCOUNTER — Other Ambulatory Visit (INDEPENDENT_AMBULATORY_CARE_PROVIDER_SITE_OTHER): Payer: Self-pay

## 2024-03-30 DIAGNOSIS — Z7189 Other specified counseling: Secondary | ICD-10-CM

## 2024-03-30 NOTE — Progress Notes (Signed)
 Diabetes Outreach    I reviewed patients chart and left a VM, with my call back information. At this time I am closing out reach for the diabetes disease management program. I will mail her information for her to review and she can reach out in the future in interested.      Active Diabetes Meds   Medication Sig    Blood-Glucose Meter,Continuous (FREESTYLE LIBRE 3 READER) Does not apply Misc 1 Each Every 6 hours as needed    Blood-Glucose Sensor (FREESTYLE LIBRE 3 SENSOR) Does not apply Device 1 Device Every 14 days    empagliflozin (JARDIANCE) 25 mg Oral Tablet Take 1 Tablet (25 mg total) by mouth Once a day    insulin glargine U-300 conc (TOUJEO MAX U-300 SOLOSTAR) 300 unit/mL (3 mL) Subcutaneous Insulin Pen Inject 35 Units under the skin Every night    insulin lispro (HUMALOG KWIKPEN INSULIN) 100 unit/mL Subcutaneous Insulin Pen Use as directed sliding scale; also use 5 units with meals Indications: type 2 diabetes mellitus    MetFORMIN (GLUCOPHAGE) 1,000 mg Oral Tablet Take 1 Tablet (1,000 mg total) by mouth Twice daily with food       Diabetes Monitors  A1C: 11.9  A1C Date: 01/27/2024  Kidney Health:   Urine Microalbumin/Cr Ratio--14.4 Ur Microalb/Cr Ratio date--01/27/2024   eGFR --70 eGFR date--01/27/2024    Last Lipid Panel  (Last result in the past 2 years)        Cholesterol   HDL   LDL   Direct LDL   Triglycerides      01/27/24 1356 234   41   123     351            Retinal Exam Date: 11/12/2023 retinopathy status not documented  Last Foot Exam: 06/03/2022      Merideth Abbey, PHARMD  [phone]

## 2024-03-31 ENCOUNTER — Telehealth (INDEPENDENT_AMBULATORY_CARE_PROVIDER_SITE_OTHER): Payer: Self-pay | Admitting: Physician Assistant

## 2024-04-04 ENCOUNTER — Encounter (INDEPENDENT_AMBULATORY_CARE_PROVIDER_SITE_OTHER): Payer: Self-pay

## 2024-04-05 ENCOUNTER — Ambulatory Visit (INDEPENDENT_AMBULATORY_CARE_PROVIDER_SITE_OTHER): Payer: Self-pay | Admitting: Physician Assistant

## 2024-04-20 ENCOUNTER — Other Ambulatory Visit (INDEPENDENT_AMBULATORY_CARE_PROVIDER_SITE_OTHER): Payer: Self-pay | Admitting: Physician Assistant

## 2024-04-20 ENCOUNTER — Encounter (INDEPENDENT_AMBULATORY_CARE_PROVIDER_SITE_OTHER): Payer: Self-pay | Admitting: Physician Assistant

## 2024-04-20 ENCOUNTER — Other Ambulatory Visit: Payer: Self-pay

## 2024-04-20 ENCOUNTER — Ambulatory Visit: Payer: Self-pay | Attending: Physician Assistant | Admitting: Physician Assistant

## 2024-04-20 VITALS — BP 128/78 | HR 85 | Ht 65.0 in | Wt 188.0 lb

## 2024-04-20 DIAGNOSIS — N390 Urinary tract infection, site not specified: Secondary | ICD-10-CM | POA: Insufficient documentation

## 2024-04-20 DIAGNOSIS — E1165 Type 2 diabetes mellitus with hyperglycemia: Secondary | ICD-10-CM | POA: Insufficient documentation

## 2024-04-20 DIAGNOSIS — E538 Deficiency of other specified B group vitamins: Secondary | ICD-10-CM | POA: Insufficient documentation

## 2024-04-20 DIAGNOSIS — M545 Low back pain, unspecified: Secondary | ICD-10-CM | POA: Insufficient documentation

## 2024-04-20 DIAGNOSIS — E785 Hyperlipidemia, unspecified: Secondary | ICD-10-CM | POA: Insufficient documentation

## 2024-04-20 DIAGNOSIS — Z7984 Long term (current) use of oral hypoglycemic drugs: Secondary | ICD-10-CM

## 2024-04-20 DIAGNOSIS — Z79899 Other long term (current) drug therapy: Secondary | ICD-10-CM | POA: Insufficient documentation

## 2024-04-20 DIAGNOSIS — Z7985 Long-term (current) use of injectable non-insulin antidiabetic drugs: Secondary | ICD-10-CM

## 2024-04-20 DIAGNOSIS — F39 Unspecified mood [affective] disorder: Secondary | ICD-10-CM | POA: Insufficient documentation

## 2024-04-20 DIAGNOSIS — G894 Chronic pain syndrome: Secondary | ICD-10-CM | POA: Insufficient documentation

## 2024-04-20 DIAGNOSIS — G8929 Other chronic pain: Secondary | ICD-10-CM | POA: Insufficient documentation

## 2024-04-20 DIAGNOSIS — N182 Chronic kidney disease, stage 2 (mild): Secondary | ICD-10-CM | POA: Insufficient documentation

## 2024-04-20 DIAGNOSIS — I10 Essential (primary) hypertension: Secondary | ICD-10-CM | POA: Insufficient documentation

## 2024-04-20 MED ORDER — DIAZEPAM 5 MG TABLET
5.0000 mg | ORAL_TABLET | Freq: Two times a day (BID) | ORAL | 0 refills | Status: DC | PRN
Start: 2024-04-20 — End: 2024-05-18

## 2024-04-20 MED ORDER — HYDROCODONE 7.5 MG-ACETAMINOPHEN 325 MG TABLET
1.0000 | ORAL_TABLET | Freq: Three times a day (TID) | ORAL | 0 refills | Status: DC | PRN
Start: 2024-04-20 — End: 2024-05-18

## 2024-04-20 MED ORDER — SEMAGLUTIDE 0.25 MG OR 0.5 MG (2 MG/3 ML) SUBCUTANEOUS PEN INJECTOR
0.5000 mg | PEN_INJECTOR | SUBCUTANEOUS | 5 refills | Status: DC
Start: 2024-04-20 — End: 2024-06-17

## 2024-04-20 MED ORDER — CYANOCOBALAMIN (VIT B-12) 1,000 MCG/ML INJECTION SOLUTION
1000.0000 ug | INTRAMUSCULAR | Status: AC
Start: 2024-04-20 — End: 2024-04-20
  Administered 2024-04-20: 1000 ug via SUBCUTANEOUS

## 2024-04-20 NOTE — Progress Notes (Signed)
 [facility]  [address]  [place] [address]  Operated by [facility]  History and Physical     Name: Grace Hoffman MRN:  [id number]   Date: 04/20/2024 Age: 73 y.o.               Follow Up        Reason for Visit: Follow Up (Routine ) labs ; B12 shot    History of Present Illness  Grace Hoffman is a 73 y.o. female who is being seen today in the office for f/u concerning blood pressure/hypertension currently 128/78.  She is currently taking Vasotec  10 mg daily as well as hydrochlorothiazide  25 mg daily p.r.n.Aaron Aas   Denies chest pain.  She does continue to have chronic dizziness for which she is seeing the neurologist and has had a MRI of her brain which noted signs of TIAs.  She states she is currently doing physical therapy for the dizziness and gait issues which seems to be helping.  Occasional headaches noted also.  She has a follow-up with Dr. Daved Eriksson scheduled.  Note patient also has been seen by the ENT where she had her crystals realigned.  She states this helped a little bit but she feels like her balance is still an issue.  She also has hx of CKD however last bun 12 cr 0.86 and gfr 70.    F/U BS w history of uncontrolled diabetes with last hemoglobin up to 11.9 so meds adjusted.  Currently patient is on metformin  at 1000 mg twice daily along with glimepiride  4 mg twice daily  Toujeo  35 units nightly ozempic  0.25mg  weekly jardiance  25 mg daily and Humalog  5 units w meals.    States blood sugar is a little bit better and  Denies increased thirst or urination.  We did discuss increasing the Ozempic  0.25-0.5 mg weekly which she is agreeable to.  She did see her ortho and needs to get A1c down in order to get surgery on her shoulder.     Follow-up cholesterol/lipids for which patient currently on Crestor  20 mg nightly.  At this time she has been side effects or issues with medication follow-up labs needed.    Follow-up B12 deficiency as well as magnesium  deficiency  with chronic fatigue issues noted.  Patient is requesting B12 injection today which do seem to help with her energy level.  Last B12 level when checked in lab stable however.    Follow-up recurrent UTI which patient at this time denies any urinary symptoms.  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  Last GFR was 54.  Follow-up urinalysis needed today.    Follow-up chronic pain/DJD/fibromyalgia for which she has been on chronic pain therapy/narcotics for numerous years.  Patient had reported increased pain in her lower back with x-rays showing moderate DJD the MRI of spine ordered.  She did have an MRI was referred to [facility] neurosurgeon who she states she has an appointment on December 26 to obtain injections in her back area.  She also is currently taking Norco 7.5 mg q.6 hours p.r.n. which does help with her chronic pain issues.  She denies any side effects to medication and compliant w urine drug screen.  Requesting medication refill.    Follow-up anxiety/depression with patient currently on Valium  5 mg daily p.r.n. as well as Effexor  37.5 mg daily.  Patient denies panic attack and states anxiety depression overall stable  at this current time.    PHQ Questionnaire         Past Medical History:   Diagnosis Date    Anxiety     Chronic back pain     COVID-19 vaccine series completed     Depression     Diabetes mellitus, type 2     Fibromyalgia affecting multiple sites     Hypertension     Hypomagnesemia     Insomnia disorder     Migraine     Mixed hyperlipidemia     Orthostatic hypotension     Osteoarthritis     TIA (transient ischemic attack)     Unspecified glaucoma(365.9)     Vitamin D  deficiency          Past Surgical History:   Procedure Laterality Date    HX CHOLECYSTECTOMY      HX KNEE REPLACMENT Left     HX ROTATOR CUFF REPAIR Left     SKIN CANCER EXCISION      forehead removed      Family Medical History:       Problem Relation (Age of Onset)     Kidney failure Mother    No Known Problems Sister, Brother, Half-Sister, Half-Brother, Maternal Aunt, Maternal Uncle, Paternal Aunt, Paternal Uncle, Maternal Grandmother, Maternal Grandfather, Paternal Grandmother, Paternal Grandfather, Daughter, Son    Respiratory Problems Father            Social History     Tobacco Use    Smoking status: Never    Smokeless tobacco: Never   Vaping Use    Vaping status: Never Used   Substance Use Topics    Alcohol use: Never    Drug use: Never     Medication:  Blood-Glucose Meter,Continuous (FREESTYLE LIBRE 3 READER) Does not apply Misc, 1 Each Every 6 hours as needed  Blood-Glucose Sensor (FREESTYLE LIBRE 3 SENSOR) Does not apply Device, 1 Device Every 14 days  d-mannose 500 mg Oral Capsule, Take 1 Capsule (500 mg total) by mouth Twice daily  empagliflozin  (JARDIANCE ) 25 mg Oral Tablet, Take 1 Tablet (25 mg total) by mouth Once a day  ergocalciferol , vitamin D2, (DRISDOL ) 1,250 mcg (50,000 unit) Oral Capsule, Take 1 Capsule (50,000 Units total) by mouth Every 7 days  insulin  glargine U-300 conc (TOUJEO  MAX U-300 SOLOSTAR) 300 unit/mL (3 mL) Subcutaneous Insulin  Pen, Inject 35 Units under the skin Every night  insulin  lispro (HUMALOG  KWIKPEN INSULIN ) 100 unit/mL Subcutaneous Insulin  Pen, Use as directed sliding scale; also use 5 units with meals Indications: type 2 diabetes mellitus  latanoprost  (XALATAN ) 0.005 % Ophthalmic Drops, Administer 1 Drop into the left eye Every night  Levetiracetam  500 mg Oral Tablet Sustained Release 24 hr, Take 1 Tablet (500 mg total) by mouth Every night  magnesium  oxide (MAG-OX) 400 mg Oral Tablet, Take 1 Tablet (400 mg total) by mouth Twice daily  MetFORMIN  (GLUCOPHAGE ) 1,000 mg Oral Tablet, Take 1 Tablet (1,000 mg total) by mouth Twice daily with food  ondansetron  (ZOFRAN  ODT) 4 mg Oral Tablet, Rapid Dissolve, Take 1 Tablet (4 mg total) by mouth Every 8 hours as needed for Nausea/Vomiting  rosuvastatin  (CRESTOR ) 20 mg Oral Tablet, Take 1 Tablet (20  mg total) by mouth Every evening  solifenacin  (VESICARE ) 5 mg Oral Tablet, Take 1 Tablet (5 mg total) by mouth Once a day  SUMAtriptan  (IMITREX ) 50 mg Oral Tablet, Take 1 Tablet (50 mg total) by mouth Once, as needed for Migraine TAKE 1 TABLET BY  MOUTH AT ONSET OF HEADACHE. IF NO RELIEF MAY REPEAT 1 AFTER AT LEAST 2 HOURS. MAX OF 4 TABS PER 24 HOURS  venlafaxine  (EFFEXOR  XR) 37.5 mg Oral Capsule, Sust. Release 24 hr, Take 1 Capsule (37.5 mg total) by mouth Every night  diazePAM  (VALIUM ) 5 mg Oral Tablet, Take 1 Tablet (5 mg total) by mouth Every 12 hours as needed for Anxiety  HYDROcodone -acetaminophen  (NORCO) 7.5-325 mg Oral Tablet, Take 1 Tablet by mouth Every 8 hours as needed for up to 30 days Indications: pain    No facility-administered medications prior to visit.    Allergies:  Allergies   Allergen Reactions    Codeine Nausea/ Vomiting    Amoxicillin Diarrhea    Macrobid  [Nitrofurantoin  Monohyd/M-Cryst] Nausea/ Vomiting       Physical Exam:  Vitals:    04/20/24 1330   BP: 128/78   Pulse: 85   SpO2: 96%   Weight: 85.3 kg (188 lb)   Height: 1.651 m (5\' 5" )   BMI: 31.28        Physical Exam  Vitals and nursing note reviewed.   Constitutional:       Appearance: Normal appearance. She is obese.   HENT:      Right Ear: Tympanic membrane normal.      Left Ear: Tympanic membrane normal.      Mouth/Throat:      Mouth: Mucous membranes are moist.      Pharynx: Oropharynx is clear.   Eyes:      Extraocular Movements: Extraocular movements intact.      Pupils: Pupils are equal, round, and reactive to light.   Cardiovascular:      Rate and Rhythm: Normal rate and regular rhythm.      Pulses: Normal pulses.   Pulmonary:      Effort: Pulmonary effort is normal.      Breath sounds: Normal breath sounds.   Abdominal:      General: Bowel sounds are normal.      Palpations: Abdomen is soft.      Tenderness: There is no abdominal tenderness. There is no right CVA tenderness, left CVA tenderness or rebound.   Musculoskeletal:          General: No swelling or tenderness. Normal range of motion.      Cervical back: Normal range of motion and neck supple.      Comments: Arthritic changes hands and knees noted bilateral  Chronic trigger points upper back and thigh area  Chronic low back pain with range of motion  Negative straight leg raises  Gait stable   Skin:     General: Skin is warm.      Findings: No lesion or rash.   Neurological:      General: No focal deficit present.      Mental Status: She is alert and oriented to person, place, and time.      Sensory: No sensory deficit.      Motor: Weakness (Strength of upper and lower extremities decreased however equal bilateral) present.      Gait: Gait normal.   Psychiatric:         Mood and Affect: Mood normal.         Behavior: Behavior normal.         Thought Content: Thought content normal.         Judgment: Judgment normal.         Assessment/Plan:  Problem List Items Addressed This Visit  Uncontrolled type 2 diabetes mellitus with hyperglycemia (CMS HCC)    Relevant Orders    CBC/DIFF    COMPREHENSIVE METABOLIC PNL, FASTING    HGA1C (HEMOGLOBIN A1C WITH EST AVG GLUCOSE)    URINALYSIS, MACROSCOPIC AND MICROSCOPIC W/CULTURE REFLEX    LIPID PANEL    MICROALBUMIN/CREATININE RATIO, URINE, RANDOM    MAGNESIUM     DRUG SCREEN, NO CONFIRMATION, URINE    Refer to Population Health Disease Management Coordinators    Hypomagnesemia    Relevant Orders    CBC/DIFF    COMPREHENSIVE METABOLIC PNL, FASTING    HGA1C (HEMOGLOBIN A1C WITH EST AVG GLUCOSE)    URINALYSIS, MACROSCOPIC AND MICROSCOPIC W/CULTURE REFLEX    LIPID PANEL    MICROALBUMIN/CREATININE RATIO, URINE, RANDOM    MAGNESIUM     DRUG SCREEN, NO CONFIRMATION, URINE    Long-term use of high-risk medication    Chronic pain    Relevant Orders    CBC/DIFF    COMPREHENSIVE METABOLIC PNL, FASTING    HGA1C (HEMOGLOBIN A1C WITH EST AVG GLUCOSE)    URINALYSIS, MACROSCOPIC AND MICROSCOPIC W/CULTURE REFLEX    LIPID PANEL    MICROALBUMIN/CREATININE  RATIO, URINE, RANDOM    MAGNESIUM     DRUG SCREEN, NO CONFIRMATION, URINE    B12 deficiency    Recurrent UTI    Essential hypertension - Primary    Relevant Orders    CBC/DIFF    COMPREHENSIVE METABOLIC PNL, FASTING    HGA1C (HEMOGLOBIN A1C WITH EST AVG GLUCOSE)    URINALYSIS, MACROSCOPIC AND MICROSCOPIC W/CULTURE REFLEX    LIPID PANEL    MICROALBUMIN/CREATININE RATIO, URINE, RANDOM    MAGNESIUM     DRUG SCREEN, NO CONFIRMATION, URINE    Chronic kidney disease (CKD), stage II (mild)    Relevant Orders    CBC/DIFF    COMPREHENSIVE METABOLIC PNL, FASTING    HGA1C (HEMOGLOBIN A1C WITH EST AVG GLUCOSE)    URINALYSIS, MACROSCOPIC AND MICROSCOPIC W/CULTURE REFLEX    LIPID PANEL    MICROALBUMIN/CREATININE RATIO, URINE, RANDOM    MAGNESIUM     DRUG SCREEN, NO CONFIRMATION, URINE    Chronic back pain    Mood disorder (CMS HCC)    Hyperlipidemia    Relevant Orders    CBC/DIFF    COMPREHENSIVE METABOLIC PNL, FASTING    HGA1C (HEMOGLOBIN A1C WITH EST AVG GLUCOSE)    URINALYSIS, MACROSCOPIC AND MICROSCOPIC W/CULTURE REFLEX    LIPID PANEL    MICROALBUMIN/CREATININE RATIO, URINE, RANDOM    MAGNESIUM     DRUG SCREEN, NO CONFIRMATION, URINE         Labs obtained in office  Increase Ozempic  0.5 mg weekly  Continue strict diet along with blood sugar monitoring  Will consult [facility] in attempt to get patient dietary help for better blood sugar control  Meds refilled as noted   B12 shot given  Continue blood pressure/blood sugar monitoring outpatient  Refuses colonoscopy  To follow-up with nephrologist/urologist /neurologist/neurosurgeon as scheduled   Will follow-up pending lab results otherwise as scheduled       Post-Discharge Follow Up Appointments       Monday Apr 25, 2024    Return Telephone Visit with Cloyce Darby, PA-C at  1:30 PM      Wednesday May 18, 2024    Return Patient Visit with Cloyce Darby, PA-C at 11:00 AM      Wednesday Jun 15, 2024    Return Patient Visit with Graylin Lea, Amy, PA-C at 11:00 AM  Wednesday Jul 13, 2024     Return Patient Visit with Cloyce Darby, PA-C at 11:00 AM      Wednesday Aug 10, 2024    Return Patient Visit with Cloyce Darby, PA-C at 10:30 AM      Wednesday Sep 07, 2024    Return Patient Visit with Cloyce Darby, PA-C at 11:00 AM      [facility] Medicine, [facility]  [facility]  [address]  [place] [address]  [phone]              Seek medical attention for new or worsening symptoms.  Patient has been seen in this clinic within the last 3 years.     Amy Graylin Lea, PA-C      I personally reviewed the documentation of care provided by the certified physician assistant and I agree with her medical decision making, Reeda Canner, D.O.       This note was partially created using MModal Fluency Direct system (voice recognition software) and is inherently subject to errors including those of syntax and "sound-alike" substitutions which may escape proofreading.  In such instances, original meaning may be extrapolated by contextual derivation.

## 2024-04-20 NOTE — Telephone Encounter (Signed)
Patient is here for routine 1 month follow up for medication refills.

## 2024-04-20 NOTE — Nursing Note (Signed)
 Patient is in the office for a routine 1 month follow up for medication refill.

## 2024-04-21 LAB — CBC WITH DIFF
BASOPHIL #: 0.1 10*3/uL (ref 0.00–0.10)
BASOPHIL %: 1 % (ref 0–1)
EOSINOPHIL #: 0.2 10*3/uL (ref 0.00–0.50)
EOSINOPHIL %: 3 % (ref 1–7)
HCT: 41.2 % (ref 31.2–41.9)
HGB: 14.2 g/dL (ref 10.9–14.3)
LYMPHOCYTE #: 2.3 10*3/uL (ref 1.10–3.10)
LYMPHOCYTE %: 38 % (ref 16–46)
MCH: 33.7 pg — ABNORMAL HIGH (ref 24.7–32.8)
MCHC: 34.6 g/dL (ref 32.3–35.6)
MCV: 97.6 fL — ABNORMAL HIGH (ref 75.5–95.3)
MONOCYTE #: 0.4 10*3/uL (ref 0.20–0.90)
MONOCYTE %: 7 % (ref 4–11)
MPV: 11.1 fL — ABNORMAL HIGH (ref 7.9–10.8)
NEUTROPHIL #: 3.2 10*3/uL (ref 1.90–8.20)
NEUTROPHIL %: 52 % (ref 43–77)
PLATELETS: 176 10*3/uL (ref 140–440)
RBC: 4.22 10*6/uL (ref 3.63–4.92)
RDW: 13.3 % (ref 12.3–17.7)
WBC: 6.2 10*3/uL (ref 3.8–11.8)

## 2024-04-21 LAB — COMPREHENSIVE METABOLIC PNL, FASTING
ALBUMIN/GLOBULIN RATIO: 1.2 (ref 0.8–1.4)
ALBUMIN: 4.2 g/dL (ref 3.5–5.7)
ALKALINE PHOSPHATASE: 68 U/L (ref 34–104)
ALT (SGPT): 23 U/L (ref 7–52)
ANION GAP: 7 mmol/L (ref 4–13)
AST (SGOT): 27 U/L (ref 13–39)
BILIRUBIN TOTAL: 0.6 mg/dL (ref 0.3–1.0)
BUN/CREA RATIO: 21 (ref 6–22)
BUN: 19 mg/dL (ref 7–25)
CALCIUM, CORRECTED: 9.5 mg/dL (ref 8.9–10.8)
CALCIUM: 9.7 mg/dL (ref 8.6–10.3)
CHLORIDE: 106 mmol/L (ref 98–107)
CO2 TOTAL: 26 mmol/L (ref 21–31)
CREATININE: 0.9 mg/dL (ref 0.60–1.30)
ESTIMATED GFR: 67 mL/min/{1.73_m2} (ref >59)
GLOBULIN: 3.4 (ref 2.0–3.5)
GLUCOSE: 185 mg/dL — ABNORMAL HIGH (ref 74–109)
OSMOLALITY, CALCULATED: 285 mosm/kg (ref 270–290)
POTASSIUM: 4.4 mmol/L (ref 3.5–5.1)
PROTEIN TOTAL: 7.6 g/dL (ref 6.4–8.9)
SODIUM: 139 mmol/L (ref 136–145)

## 2024-04-21 LAB — URINALYSIS, MICROSCOPIC
RBCS: 16 /HPF — ABNORMAL HIGH (ref <4)
WBCS: 14 /HPF — ABNORMAL HIGH (ref <6)

## 2024-04-21 LAB — MICROALBUMIN/CREATININE RATIO, URINE, RANDOM
CREATININE RANDOM URINE: 357 mg/dL — ABNORMAL HIGH (ref 30–125)
MICROALBUMIN RANDOM URINE: 3.4 mg/dL
MICROALBUMIN/CREATININE RATIO RANDOM URINE: 9.5 mg/g

## 2024-04-21 LAB — DRUG SCREEN, NO CONFIRMATION, URINE
AMPHETAMINES URINE: NEGATIVE
BARBITURATES URINE: NEGATIVE
BENZODIAZEPINES URINE: POSITIVE — AB
BUPRENORPHINE URINE: NEGATIVE
CANNABINOIDS URINE: NEGATIVE
COCAINE METABOLITES URINE: NEGATIVE
FENTANYL, URINE: NEGATIVE
METHADONE URINE: NEGATIVE
OPIATES URINE: POSITIVE — AB
OXYCODONE URINE: NEGATIVE
PCP URINE: NEGATIVE

## 2024-04-21 LAB — URINALYSIS, MACROSCOPIC
BILIRUBIN: NEGATIVE mg/dL
BLOOD: NEGATIVE mg/dL
GLUCOSE: NEGATIVE mg/dL
KETONES: NEGATIVE mg/dL
LEUKOCYTES: 250 WBCs/uL — AB
NITRITE: NEGATIVE
PH: 5.5 (ref 5.0–9.0)
PROTEIN: 20 mg/dL
SPECIFIC GRAVITY: 1.031 — ABNORMAL HIGH (ref 1.002–1.030)
UROBILINOGEN: 2 mg/dL — AB

## 2024-04-21 LAB — LIPID PANEL
CHOL/HDL RATIO: 3.4
CHOLESTEROL: 151 mg/dL (ref <200)
HDL CHOL: 45 mg/dL (ref >=40)
LDL CALC: 69 mg/dL (ref 0–100)
TRIGLYCERIDES: 184 mg/dL — ABNORMAL HIGH (ref <=150)
VLDL CALC: 37 mg/dL (ref 0–50)

## 2024-04-21 LAB — HGA1C (HEMOGLOBIN A1C WITH EST AVG GLUCOSE): HEMOGLOBIN A1C: 9.1 % — ABNORMAL HIGH (ref 4.0–6.0)

## 2024-04-21 LAB — MAGNESIUM: MAGNESIUM: 1.7 mg/dL — ABNORMAL LOW (ref 1.9–2.7)

## 2024-04-22 ENCOUNTER — Ambulatory Visit (INDEPENDENT_AMBULATORY_CARE_PROVIDER_SITE_OTHER): Payer: Self-pay

## 2024-04-23 LAB — URINE CULTURE,ROUTINE: URINE CULTURE: NO GROWTH — AB

## 2024-04-25 ENCOUNTER — Ambulatory Visit: Payer: Medicare Other | Admitting: Physician Assistant

## 2024-04-25 ENCOUNTER — Other Ambulatory Visit: Payer: Self-pay

## 2024-05-03 ENCOUNTER — Ambulatory Visit: Payer: Medicare Other | Admitting: Physician Assistant

## 2024-05-12 ENCOUNTER — Other Ambulatory Visit (INDEPENDENT_AMBULATORY_CARE_PROVIDER_SITE_OTHER): Payer: Self-pay | Admitting: Physician Assistant

## 2024-05-12 MED ORDER — ERGOCALCIFEROL (VITAMIN D2) 1,250 MCG (50,000 UNIT) CAPSULE
50000.0000 [IU] | ORAL_CAPSULE | ORAL | 1 refills | Status: DC
Start: 2024-05-12 — End: 2024-08-10

## 2024-05-18 ENCOUNTER — Encounter (INDEPENDENT_AMBULATORY_CARE_PROVIDER_SITE_OTHER): Payer: Self-pay | Admitting: Physician Assistant

## 2024-05-18 ENCOUNTER — Other Ambulatory Visit (INDEPENDENT_AMBULATORY_CARE_PROVIDER_SITE_OTHER): Payer: Self-pay | Admitting: Physician Assistant

## 2024-05-18 ENCOUNTER — Other Ambulatory Visit: Payer: Self-pay

## 2024-05-18 ENCOUNTER — Ambulatory Visit: Payer: Medicare Other | Attending: Physician Assistant | Admitting: Physician Assistant

## 2024-05-18 VITALS — BP 124/80 | HR 77 | Ht 65.0 in | Wt 185.0 lb

## 2024-05-18 DIAGNOSIS — E1165 Type 2 diabetes mellitus with hyperglycemia: Secondary | ICD-10-CM | POA: Insufficient documentation

## 2024-05-18 DIAGNOSIS — E1122 Type 2 diabetes mellitus with diabetic chronic kidney disease: Secondary | ICD-10-CM | POA: Insufficient documentation

## 2024-05-18 DIAGNOSIS — I10 Essential (primary) hypertension: Secondary | ICD-10-CM | POA: Insufficient documentation

## 2024-05-18 DIAGNOSIS — N182 Chronic kidney disease, stage 2 (mild): Secondary | ICD-10-CM | POA: Insufficient documentation

## 2024-05-18 DIAGNOSIS — F32A Depression, unspecified: Secondary | ICD-10-CM | POA: Insufficient documentation

## 2024-05-18 DIAGNOSIS — F419 Anxiety disorder, unspecified: Secondary | ICD-10-CM | POA: Insufficient documentation

## 2024-05-18 DIAGNOSIS — Z7984 Long term (current) use of oral hypoglycemic drugs: Secondary | ICD-10-CM

## 2024-05-18 DIAGNOSIS — E538 Deficiency of other specified B group vitamins: Secondary | ICD-10-CM | POA: Insufficient documentation

## 2024-05-18 DIAGNOSIS — G894 Chronic pain syndrome: Secondary | ICD-10-CM | POA: Insufficient documentation

## 2024-05-18 DIAGNOSIS — N390 Urinary tract infection, site not specified: Secondary | ICD-10-CM | POA: Insufficient documentation

## 2024-05-18 MED ORDER — HYDROCODONE 7.5 MG-ACETAMINOPHEN 325 MG TABLET
1.0000 | ORAL_TABLET | Freq: Three times a day (TID) | ORAL | 0 refills | Status: DC | PRN
Start: 2024-05-18 — End: 2024-06-15

## 2024-05-18 MED ORDER — CYANOCOBALAMIN (VIT B-12) 1,000 MCG/ML INJECTION SOLUTION
1000.0000 ug | INTRAMUSCULAR | Status: AC
Start: 2024-05-18 — End: 2024-05-18
  Administered 2024-05-18: 1000 ug via SUBCUTANEOUS

## 2024-05-18 MED ORDER — DIAZEPAM 5 MG TABLET
5.0000 mg | ORAL_TABLET | Freq: Two times a day (BID) | ORAL | 0 refills | Status: DC | PRN
Start: 2024-05-18 — End: 2024-07-13

## 2024-05-18 MED ORDER — ROSUVASTATIN 20 MG TABLET: 20.0000 mg | ORAL_TABLET | Freq: Every evening | ORAL | 4 refills | Status: DC

## 2024-05-18 NOTE — Assessment & Plan Note (Signed)
 Continue Effexor  qd

## 2024-05-18 NOTE — Assessment & Plan Note (Signed)
 On chronic pain meds  Continue Norco 7.5mg  q 8 hrs - refills given

## 2024-05-18 NOTE — Assessment & Plan Note (Signed)
Continue following w nephrologist

## 2024-05-18 NOTE — Progress Notes (Signed)
 [facility]  [address]  [place] [address]          Follow Up        Reason for Visit: Follow Up (Essential HTN, Type 2 Diabetes)    History of Present Illness  Grace Hoffman is a 73 y.o. female who is being seen today in office for  f/u concerning blood pressure/hypertension w BP currently stable at 124/80.  She continues taking Vasotec  10 mg daily as well as hydrochlorothiazide  25 mg daily p.r.n.Aaron Aas   Denies chest pain.  She does continue to have chronic dizziness for which she is seeing the neurologist and has had a MRI of her brain which noted signs of TIAs.  Pt has Occasional headaches noted and has had eval by neurologist as well in the past which was unremarkable. She has  also has been seen by the ENT where she had her crystals realigned.  She states this helped a little bit but she feels like her balance is still an issue.     F/U BS w history of uncontrolled diabetes with last hemoglobin up to 11.9 so meds adjusted.  Currently patient is on metformin  at 1000 mg twice daily along with glimepiride  4 mg twice daily  Toujeo  35 units nightly ozempic  0.5mg  weekly jardiance  25 mg daily and Humalog  5 units w meals.    States blood sugar is a little bit better and  Denies increased thirst or urination.   She did see her ortho and needs to get A1c down in order to get surgery on her shoulder. Hx of CKD stage 2 as well noted w last labs improved and bun 19 cr 0.90 and gfr 67. Continues following w nephrologist. Previously GFR had been down in the 50's but recent improvement noted. Pt denies any urinary sx.     Follow-up B12 deficiency as well as magnesium  deficiency with chronic fatigue issues noted.  Patient is requesting B12 injection today which do seem to help with her energy level.  Last B12 level when checked in lab stable however.     Follow-up recurrent UTI which patient at this time denies any urinary symptoms.    Follow-up urinalysis needed today.     Follow-up chronic  pain/DJD/fibromyalgia for which she has been on chronic pain therapy/narcotics for numerous years.  Patient had reported increased pain in her lower back with x-rays showing moderate DJD the MRI of spine ordered.  She did have an MRI and  was referred to [facility] neurosurgeon.  She also is currently taking Norco 7.5 mg q.6 hours p.r.n. which does help with her chronic pain issues.  She denies any side effects to medication and compliant w urine drug screen.  Requesting medication refill.     Follow-up anxiety/depression with patient currently on Valium  5 mg daily p.r.n. as well as Effexor  37.5 mg daily.  Patient denies panic attack and states anxiety depression overall stable at this current time.    PHQ Questionnaire               Past Medical History:   Diagnosis Date    Anxiety     Chronic back pain     COVID-19 vaccine series completed     Depression     Diabetes mellitus, type 2     Fibromyalgia affecting multiple sites     Hypertension     Hypomagnesemia     Insomnia disorder     Migraine     Mixed  hyperlipidemia     Orthostatic hypotension     Osteoarthritis     TIA (transient ischemic attack)     Unspecified glaucoma(365.9)     Vitamin D  deficiency          Past Surgical History:   Procedure Laterality Date    HX CHOLECYSTECTOMY      HX KNEE REPLACMENT Left     HX ROTATOR CUFF REPAIR Left     SKIN CANCER EXCISION      forehead removed      Family Medical History:       Problem Relation (Age of Onset)    Kidney failure Mother    No Known Problems Sister, Brother, Half-Sister, Half-Brother, Maternal Aunt, Maternal Uncle, Paternal Aunt, Paternal Uncle, Maternal Grandmother, Maternal Grandfather, Paternal Grandmother, Paternal Grandfather, Daughter, Son    Respiratory Problems Father            Social History     Tobacco Use    Smoking status: Never    Smokeless tobacco: Never   Vaping Use    Vaping status: Never Used   Substance Use Topics    Alcohol use: Never    Drug use: Never     Medication:  Blood-Glucose  Meter,Continuous (FREESTYLE LIBRE 3 READER) Does not apply Misc, 1 Each Every 6 hours as needed  Blood-Glucose Sensor (FREESTYLE LIBRE 3 SENSOR) Does not apply Device, 1 Device Every 14 days  d-mannose 500 mg Oral Capsule, Take 1 Capsule (500 mg total) by mouth Twice daily (Patient not taking: Reported on 05/18/2024)  empagliflozin  (JARDIANCE ) 25 mg Oral Tablet, Take 1 Tablet (25 mg total) by mouth Once a day  enalapril  maleate (VASOTEC ) 10 mg Oral Tablet, Take 1 Tablet (10 mg total) by mouth Daily  ergocalciferol , vitamin D2, (DRISDOL ) 1,250 mcg (50,000 unit) Oral Capsule, Take 1 Capsule (50,000 Units total) by mouth Every 7 days  HYDROcodone -acetaminophen  (NORCO) 7.5-325 mg Oral Tablet, Take 1 Tablet by mouth Every 8 hours as needed for up to 30 days Indications: pain  insulin  glargine U-300 conc (TOUJEO  MAX U-300 SOLOSTAR) 300 unit/mL (3 mL) Subcutaneous Insulin  Pen, Inject 35 Units under the skin Every night  insulin  lispro (HUMALOG  KWIKPEN INSULIN ) 100 unit/mL Subcutaneous Insulin  Pen, Use as directed sliding scale; also use 5 units with meals Indications: type 2 diabetes mellitus  latanoprost  (XALATAN ) 0.005 % Ophthalmic Drops, Administer 1 Drop into the left eye Every night  Levetiracetam  500 mg Oral Tablet Sustained Release 24 hr, Take 1 Tablet (500 mg total) by mouth Every night  magnesium  oxide (MAG-OX) 400 mg Oral Tablet, Take 1 Tablet (400 mg total) by mouth Twice daily  MetFORMIN  (GLUCOPHAGE ) 1,000 mg Oral Tablet, Take 1 Tablet (1,000 mg total) by mouth Twice daily with food  ondansetron  (ZOFRAN  ODT) 4 mg Oral Tablet, Rapid Dissolve, Take 1 Tablet (4 mg total) by mouth Every 8 hours as needed for Nausea/Vomiting  semaglutide  (OZEMPIC ) 0.25 mg or 0.5 mg (2 mg/3 mL) Subcutaneous Pen Injector, Inject 0.5 mg under the skin Every 7 days  solifenacin  (VESICARE ) 5 mg Oral Tablet, Take 1 Tablet (5 mg total) by mouth Once a day  SUMAtriptan  (IMITREX ) 50 mg Oral Tablet, Take 1 Tablet (50 mg total) by mouth Once,  as needed for Migraine TAKE 1 TABLET BY MOUTH AT ONSET OF HEADACHE. IF NO RELIEF MAY REPEAT 1 AFTER AT LEAST 2 HOURS. MAX OF 4 TABS PER 24 HOURS  venlafaxine  (EFFEXOR  XR) 37.5 mg Oral Capsule, Sust. Release 24 hr, Take 1  Capsule (37.5 mg total) by mouth Every night  diazePAM  (VALIUM ) 5 mg Oral Tablet, Take 1 Tablet (5 mg total) by mouth Every 12 hours as needed for Anxiety  rosuvastatin  (CRESTOR ) 20 mg Oral Tablet, Take 1 Tablet (20 mg total) by mouth Every evening    No facility-administered medications prior to visit.    Allergies:  Allergies   Allergen Reactions    Codeine Nausea/ Vomiting    Amoxicillin Diarrhea    Macrobid  [Nitrofurantoin  Monohyd/M-Cryst] Nausea/ Vomiting       Physical Exam:  Vitals:    05/18/24 1209   BP: 124/80   Pulse: 77   SpO2: 99%   Weight: 83.9 kg (185 lb)   Height: 1.651 m (5\' 5" )   BMI: 30.79      Physical Exam  Vitals and nursing note reviewed.   Constitutional:       Appearance: Normal appearance. She is obese.   HENT:      Right Ear: Tympanic membrane normal.      Left Ear: Tympanic membrane normal.      Mouth/Throat:      Mouth: Mucous membranes are moist.      Pharynx: Oropharynx is clear.   Eyes:      Extraocular Movements: Extraocular movements intact.      Pupils: Pupils are equal, round, and reactive to light.   Cardiovascular:      Rate and Rhythm: Normal rate and regular rhythm.      Pulses: Normal pulses.   Pulmonary:      Effort: Pulmonary effort is normal.      Breath sounds: Normal breath sounds.   Abdominal:      General: Bowel sounds are normal.      Palpations: Abdomen is soft.      Tenderness: There is no abdominal tenderness. There is no right CVA tenderness, left CVA tenderness or rebound.   Musculoskeletal:         General: No swelling or tenderness. Normal range of motion.      Cervical back: Normal range of motion and neck supple.      Comments: Arthritic changes hands and knees noted bilateral  Chronic trigger points upper back and thigh area  Chronic low back  pain with range of motion  Negative straight leg raises  Gait stable   Skin:     General: Skin is warm.      Findings: No lesion or rash.   Neurological:      General: No focal deficit present.      Mental Status: She is alert and oriented to person, place, and time.      Sensory: No sensory deficit.      Motor: Weakness (Strength of upper and lower extremities decreased however equal bilateral) present.      Gait: Gait normal.   Psychiatric:         Mood and Affect: Mood normal.         Behavior: Behavior normal.         Thought Content: Thought content normal.         Judgment: Judgment normal.   Urine Dip Results:   Time collected: 1215  Glucose (Ref Range: Negative mg/dL): (!) 3+ (2130 mg/dl)  Bilirubin (Ref Range: Negative mg/dL): Negative  Ketones (Ref Range: Negative mg/dL): Negative  Urine Specific Gravity (Ref Range: 1.005 - 1.030): 1.015  Blood (urine) (Ref Range: Negative mg/dL): Negative  pH (Ref Range: 5.0 - 8.0): 5.5  Protein (Ref Range: Negative  mg/dL): Negative  Urobilinogen (Ref Range: Normal): 0.2mg /dL (Normal)  Nitrite (Ref Range: Negative): Negative  Leukocytes (Ref Range: Negative WBC's/uL): Negative       Assessment & Plan  Essential hypertension  BP stable on current meds  Continue monitoring  Uncontrolled type 2 diabetes mellitus with hyperglycemia (CMS HCC)  Continue current meds along w diet BS monitoring  CKD stage 2 due to type 2 diabetes mellitus (CMS HCC)  (CMS HCC)  Continue following w nephrologist  B12 deficiency  B12 injection given  Recurrent UTI  Repeat urine obtained today neg  Chronic pain syndrome  On chronic pain meds  Continue Norco 7.5mg  q 8 hrs- refills given  Anxiety  Continue valium  5mg  bid- refills given  Depression, unspecified depression type  Continue Effexor  qd     Orders Placed This Encounter    URINALYSIS, MACROSCOPIC AND MICROSCOPIC W/CULTURE REFLEX    cyanocobalamin  (VITAMIN B12) 1000 mcg/mL injection    rosuvastatin  (CRESTOR ) 20 mg Oral Tablet    diazePAM   (VALIUM ) 5 mg Oral Tablet         Follow up:  Post-Discharge Follow Up Appointments       Tuesday May 24, 2024    Return Telephone Visit with Cloyce Darby, PA-C at  2:00 PM      Wednesday Jun 15, 2024    Return Patient Visit with Cloyce Darby, PA-C at 11:00 AM      Wednesday Jul 13, 2024    Return Patient Visit with Cloyce Darby, PA-C at 11:00 AM      Wednesday Aug 10, 2024    Return Patient Visit with Cloyce Darby, PA-C at 10:30 AM      Wednesday Sep 07, 2024    Return Patient Visit with Cloyce Darby, PA-C at 11:00 AM      [facility]  [facility]  [address]  [place] [address]  [phone]           Seek medical attention for new or worsening symptoms.  Patient has been seen in this clinic within the last 3 years.     Conley Delisle, PA-C          This note was partially created using MModal Fluency Direct system (voice recognition software) and is inherently subject to errors including those of syntax and "sound-alike" substitutions which may escape proofreading.  In such instances, original meaning may be extrapolated by contextual derivation.

## 2024-05-18 NOTE — Assessment & Plan Note (Signed)
 B12 injection given

## 2024-05-18 NOTE — Assessment & Plan Note (Signed)
 Repeat urine obtained today neg

## 2024-05-18 NOTE — Assessment & Plan Note (Signed)
 BP stable on current meds  Continue monitoring

## 2024-05-18 NOTE — Assessment & Plan Note (Signed)
 Continue valium  5mg  bid- refills given

## 2024-05-18 NOTE — Assessment & Plan Note (Signed)
 Continue current meds along w diet BS monitoring

## 2024-05-24 ENCOUNTER — Other Ambulatory Visit: Payer: Self-pay

## 2024-05-24 ENCOUNTER — Ambulatory Visit: Attending: Physician Assistant | Admitting: Physician Assistant

## 2024-05-24 ENCOUNTER — Encounter (INDEPENDENT_AMBULATORY_CARE_PROVIDER_SITE_OTHER): Payer: Self-pay | Admitting: Physician Assistant

## 2024-05-24 DIAGNOSIS — Z0000 Encounter for general adult medical examination without abnormal findings: Secondary | ICD-10-CM

## 2024-05-24 NOTE — Patient Instructions (Signed)
 Medicare Preventive Services  Medicare coverage information Recommendation for YOU   Heart Disease and Diabetes   Lipid profile Every 5 years or more often if at risk for cardiovascular disease     Lab Results   Component Value Date    CHOLESTEROL 151 04/20/2024    HDLCHOL 45 04/20/2024    LDLCHOL 69 04/20/2024    TRIG 184 (H) 04/20/2024           Diabetes Screening    Yearly for those at risk for diabetes, 2 tests per year for those with prediabetes Last Glucose: 185    Diabetes Self Management Training or Medical Nutrition Therapy  For those with diabetes, up to 10 hrs initial training within a year, subsequent years up to 2 hrs of follow up training Optional for those with diabetes     Medical Nutrition Therapy  Three hours of one-on-one counseling in first year, two hours in subsequent years Optional for those with diabetes, kidney disease   Intensive Behavioral Therapy for Obesity  Face-to-face counseling, first month every week, month 2-6 every other week, month 7-12 every month if continued progress is documented Optional for those with Body Mass Index 30 or higher  Your There is no height or weight on file to calculate BMI.   Tobacco Cessation (Quitting) Counseling   Covers up to 8 smoking and tobacco-use cessation counseling sessions in a 23-month period.    Optional for those that use tobacco   Cancer Screening Last Completion Date   Colorectal screening   For anyone age 94 to 40 or any age if high risk:  Screening Colonoscopy every 10 yrs if low risk,  more frequent if higher risk  OR  Cologuard Stool DNA test once every 3 years OR  Fecal Occult Blood Testing yearly OR  Flexible  Sigmoidoscopy  every 5 yr OR  CT Colonography every 5 yrs      See below for due date if applicable.   Screening Pap Test   Recommended every 3 years for all women age 84 to 16, or every five years if combined with HPV test (routine screening not needed after total hysterectomy).  Medicare covers every 2 years or yearly if high  risk.  Screening Pelvic Exam   Medicare covers every 2 years, yearly if high risk or childbearing age with abnormal Pap in last 3 yrs.     See below for due date if applicable.   Screening Mammogram   Recommended every 2 years for women age 71 to 32, or more frequent if you have a higher risk. Selectively recommended for women between 40-49 based on shared decisions about risk. Covered by Medicare up to every year for women age 94 or older   See below for due date if applicable.         Lung Cancer Screening  Annual low dose computed tomography (LDCT scan) is recommended for those age 66-80 who smoked 20 pack-years and are current smokers or quit smoking within past 15 years, after counseling by your doctor or nurse clinician about the possible benefits or harms.     See below for due date if applicable.   Vaccinations   Respiratory syncytial virus (RSV)  Age 25 years or older: Based on shared clinical decision-making with your provider.  Pneumococcal Vaccine  Recommended routinely age 61+ with one or two separate vaccines based on your risk. Recommended before age 13 if medical conditions with increased risk  Seasonal Influenza Vaccine  Once  every flu season   Hepatitis B Vaccine  3 doses if risk (including anyone with diabetes or liver disease)  Shingles Vaccine  Two doses at age 66 or older  Diphtheria Tetanus Pertussis Vaccine  ONCE as adult, booster every 10 years     Immunization History   Administered Date(s) Administered   . Covid-19 Vaccine,Pfizer-BioNTech,Purple Top,72yrs+ 02/17/2020, 03/09/2020, 03/09/2020, 02/21/2021   . FLUZONE HD VACCINE (ADMIN) 09/16/2016, 09/27/2017   . High-Dose Influenza Vaccine, 65+ 10/14/2020   . Influenza Vaccine, 6 month-adult 10/10/2013, 09/20/2021   . PNEUMOCOCCAL CONJ VACCINES 05/10/2004   . PREVNAR 13 03/09/2017   . Shingrix - Zoster Vaccine 02/27/2017, 07/20/2017   . Td (Adult) 5 Lf Tetanus Toxoid, Preservative Free, Absorbed 08/22/2019   . Tetanus Toxoid/Diphtheria  Toxoid/Acellular Pertussis Vaccine, Adsorbed 08/22/2019     Shingles vaccine and Diphtheria Tetanus Pertussis vaccines are available at pharmacies or local health department without a prescription.   Other Preventative Screening  Last Completion Date   Bone Densitometry   Screening: All females ages 22 and older every 10 years if initial screening normal. Postmenopausal women ages 38-64 need screening with one or more risk factor: previous fracture, parental hip fracture, current smoker, low body weight, excessive alcohol use, Rheumatoid Arthritis   For women with diagnosed Osteoporosis, follow up is recommended every 2 years or a frequency recommended by your provider.     --07/30/2018  See below for due date if applicable.     Glaucoma Screening   Yearly if in high risk group such as diabetes, family history, African American age 8+ or Hispanic American age 15+   See your eye care provider for screening.   Hepatitis C Screening   Recommended  for those born between ages 18-79 years.   --04/17/2022  See below for due date if applicable.     HIV Testing  Recommended routinely at least ONCE, covered every year for age 100 to 42 regardless of risk, and every year for age over 84 who ask for the test or higher risk. Yearly or up to 3 times in pregnancy         See below for due date if applicable.   Abdominal Aortic Aneurysm Screening Ultrasound   Once with a family history of abdominal aortic aneurysms OR a female between65-75 and have smoked at least 100 cigarettes in your lifetime.         See below for due date if applicable.       Your Personalized Schedule for Preventive Tests   Health Maintenance: Pending and Last Completed       Date Due Completion Date    RSV Adult 60+ or Pregnancy (1 - 1-dose 75+ series) 12/29/2024 (Originally 01/09/2024) ---    Diabetic A1C 07/20/2024 04/20/2024    Influenza Vaccine (Season Ended) 08/22/2024 10/09/2022    Diabetic Retinal Exam 11/11/2024 11/12/2023    Override on 12/24/2021:  Previously completed    Diabetic Kidney Health eGFR 04/20/2025 04/20/2024    Diabetic Kidney Health Microalb/Cr Ratio 04/20/2025 04/20/2024    Adult Tdap-Td (2 - Td or Tdap) 08/21/2029 08/22/2019             Non-Opioid Treatment for Chronic Pains   Treatment for chronic pain can be managed without opioids. Below are non-opioid options that may be considered and discussed with your provider to determine which options would be best for your health.    Over the counter or presciptions medications:  Acetaminophen  (Tylenol ) or Non-steroidal anti-inflammatories such as:  Ibuprofen (Motrin, Advil), naproxen (Aleve), aspirin  Antidepressants such as amitriptyline, nortriptyline (Pamelor),  Doxepin (Silenor), Imipramine (Tofranil) and others.  Anticonvulsant Nerve pain medications: Gabapentin (Neurontin), pregabalin (Lyrica)  Externally applied medications such as NSAID'S, lidocaine, capsaisin, and others  Injections: pain specialists can sometimes inject medications at the site of pain.    Alternative therapies such as  Acupuncture  Osteopathic manipulation  Chiropractic  Massage therapy       For Information on Advanced Directives for Health Care:  [place]:  [email or web address]  [place], [place], [place], [place] General Information: [email or web address]

## 2024-05-24 NOTE — Nursing Note (Signed)
Patient completed Medicare Wellness Exam today.

## 2024-05-24 NOTE — Progress Notes (Signed)
 INTERNAL [facility]ICINE[facility]ING A  [address]  [place] [address]  Operated by [facility]  Medicare Annual Wellness Visit    Name: Grace Hoffman MRN:  [id number]   Date: 05/24/2024 Age: 73 y.o.       SUBJECTIVE:   Grace Hoffman is a 73 y.o. female for presenting for Medicare Wellness exam.   I have reviewed and reconciled the medication list with the patient today.    TELEMEDICINE DOCUMENTATION:    [facility]    Patient/family aware of provider location:  yes  Patient/family consent for telemedicine:  yes  Examination observed and performed by:  Cloyce Darby, PA-C         05/24/2024     1:37 PM 04/22/2023     3:50 PM 04/07/2022     2:54 PM   Comprehensive Health Assessment-Adult   Do you wish to complete this form? Yes Yes Yes   During the past 4 weeks, how would you rate your health in general? Grace Hoffman Fair   During the past 4 weeks, how much difficulty have you had doing your usual activities inside and outside your home because of medical or emotional problems? Some difficulty A little bit of difficulty A little bit of difficulty   During the past 4 weeks, was someone available to help you if you needed and wanted help? Yes, as much as I wanted Yes, as much as I wanted Yes, as much as I wanted   In the past year, how many times have you gone to the emergency department or been admitted to a hospital for a health problem? 2-4 times 1 time None   Are you generally satisfied with your sleep? Yes Yes Yes   Do you have enough money to buy things you need in everyday life, such as food, clothing, medicines, and housing? Sometimes No Yes, always   Can you get to places beyond walking distance without help?  (For example, can you drive your own car or travel alone on buses)? Yes Yes Yes   Do you fasten your seatbelt when you are in a car? Yes, usually Yes, usually Yes, usually   Do you exercise 20 minutes 3 or more days per week (such as walking, dancing, biking, mowing grass,  swimming)? Yes, most of the time Yes, most of the time Yes, most of the time   How often do you eat food that is healthy (fruits, vegetables, lean meats) instead of unhealthy (sweets, fast food, junk food, fatty foods)? Almost always Almost always Most of the time   Have your parents, brothers or sisters had any of the following problems before the age of 66? (check all that apply)  Cancer    How often do you have trouble taking medicines the eay you are told to take them? I always take them as prescribed I always take them as prescribed I always take them as prescribed   Do you need any help communicating with your doctors and nurses because of vision or hearing problems? No No No   During the past 12 months, have you experienced confusion or memory loss that is happening more often or is getting worse? No No No   Do you have one person you think of as your personal doctor (primary care provider or family doctor)? Yes Yes    If you are seeing a Primary Care Provider (PCP) or family doctor. please list their name Cloyce Darby, PA-C Jessicia Napolitano  Donesha Wallander Jakevion Arney Pa-C   Are you now also seeing any specialist physician(s) (such as eye doctor, foot doctor, skin doctor)? Yes Yes No   If you are seeing a specialist for anything such as foot, eye, skin, etc.  please list their name(s) Orthopedic; foot dr    How confident are you that you can control or manage most of your health problems? Very confident Very confident Very confident       I have reviewed and updated as appropriate the past medical, family and social history. 05/24/2024 as summarized below:  Past Medical History:   Diagnosis Date    Anxiety     Chronic back pain     COVID-19 vaccine series completed     Depression     Diabetes mellitus, type 2     Fibromyalgia affecting multiple sites     Hypertension     Hypomagnesemia     Insomnia disorder     Migraine     Mixed hyperlipidemia     Orthostatic hypotension     Osteoarthritis     TIA (transient ischemic attack)      Unspecified glaucoma(365.9)     Vitamin D  deficiency      Past Surgical History:   Procedure Laterality Date    Hx cholecystectomy      Hx knee replacment Left     Hx rotator cuff repair Left     Skin cancer excision       Current Outpatient Medications   Medication Sig    Blood-Glucose Meter,Continuous (FREESTYLE LIBRE 3 READER) Does not apply Misc 1 Each Every 6 hours as needed    Blood-Glucose Sensor (FREESTYLE LIBRE 3 SENSOR) Does not apply Device 1 Device Every 14 days    d-mannose 500 mg Oral Capsule Take 1 Capsule (500 mg total) by mouth Twice daily (Patient not taking: Reported on 05/18/2024)    diazePAM  (VALIUM ) 5 mg Oral Tablet Take 1 Tablet (5 mg total) by mouth Every 12 hours as needed for Anxiety    empagliflozin  (JARDIANCE ) 25 mg Oral Tablet Take 1 Tablet (25 mg total) by mouth Once a day    enalapril  maleate (VASOTEC ) 10 mg Oral Tablet Take 1 Tablet (10 mg total) by mouth Daily    ergocalciferol , vitamin D2, (DRISDOL ) 1,250 mcg (50,000 unit) Oral Capsule Take 1 Capsule (50,000 Units total) by mouth Every 7 days    HYDROcodone -acetaminophen  (NORCO) 7.5-325 mg Oral Tablet Take 1 Tablet by mouth Every 8 hours as needed for up to 30 days Indications: pain    insulin  glargine U-300 conc (TOUJEO  MAX U-300 SOLOSTAR) 300 unit/mL (3 mL) Subcutaneous Insulin  Pen Inject 35 Units under the skin Every night    insulin  lispro (HUMALOG  KWIKPEN INSULIN ) 100 unit/mL Subcutaneous Insulin  Pen Use as directed sliding scale; also use 5 units with meals Indications: type 2 diabetes mellitus    latanoprost  (XALATAN ) 0.005 % Ophthalmic Drops Administer 1 Drop into the left eye Every night    Levetiracetam  500 mg Oral Tablet Sustained Release 24 hr Take 1 Tablet (500 mg total) by mouth Every night    magnesium  oxide (MAG-OX) 400 mg Oral Tablet Take 1 Tablet (400 mg total) by mouth Twice daily    MetFORMIN  (GLUCOPHAGE ) 1,000 mg Oral Tablet Take 1 Tablet (1,000 mg total) by mouth Twice daily with food    ondansetron  (ZOFRAN  ODT)  4 mg Oral Tablet, Rapid Dissolve Take 1 Tablet (4 mg total) by mouth Every 8 hours as needed for Nausea/Vomiting  rosuvastatin  (CRESTOR ) 20 mg Oral Tablet Take 1 Tablet (20 mg total) by mouth Every evening    semaglutide  (OZEMPIC ) 0.25 mg or 0.5 mg (2 mg/3 mL) Subcutaneous Pen Injector Inject 0.5 mg under the skin Every 7 days    solifenacin  (VESICARE ) 5 mg Oral Tablet Take 1 Tablet (5 mg total) by mouth Once a day    SUMAtriptan  (IMITREX ) 50 mg Oral Tablet Take 1 Tablet (50 mg total) by mouth Once, as needed for Migraine TAKE 1 TABLET BY MOUTH AT ONSET OF HEADACHE. IF NO RELIEF MAY REPEAT 1 AFTER AT LEAST 2 HOURS. MAX OF 4 TABS PER 24 HOURS    venlafaxine  (EFFEXOR  XR) 37.5 mg Oral Capsule, Sust. Release 24 hr Take 1 Capsule (37.5 mg total) by mouth Every night     Family Medical History:       Problem Relation (Age of Onset)    Kidney failure Mother    No Known Problems Sister, Brother, Half-Sister, Half-Brother, Maternal Aunt, Maternal Uncle, Paternal Aunt, Paternal Uncle, Maternal Grandmother, Maternal Grandfather, Paternal Grandmother, Paternal Grandfather, Daughter, Son    Respiratory Problems Father            Social History     Socioeconomic History    Marital status: Married    Number of children: 1    Years of education: 11   Tobacco Use    Smoking status: Never    Smokeless tobacco: Never   Vaping Use    Vaping status: Never Used   Substance and Sexual Activity    Alcohol use: Never    Drug use: Never    Sexual activity: Not Currently     Partners: Male     Social Determinants of Health     Social Connections: Low Risk  (07/29/2023)    Social Connections     SDOH Social Isolation: 5 or more times a week   Health Literacy: Medium Risk (07/29/2023)    Health Literacy     SDOH Health Literacy: Occasionally         List of Current Health Care Providers   Care Team       PCP       Name Type Specialty Phone Number    Cloyce Darby, [place] Physician Assistant PHYSICIAN ASSISTANT [phone]              Care Team        No care team found                      Health Maintenance   Topic Date Due    RSV Adult 60+ or Pregnancy (1 - 1-dose 75+ series) 12/29/2024 (Originally 01/09/2024)    Diabetic A1C  07/20/2024    Influenza Vaccine (Season Ended) 08/22/2024    Diabetic Retinal Exam  11/11/2024    Diabetic Kidney Health eGFR  04/20/2025    Diabetic Kidney Health Microalb/Cr Ratio  04/20/2025    Adult Tdap-Td (2 - Td or Tdap) 08/21/2029    Hepatitis C screening  Completed    Shingles Vaccine  Completed    Medicare Annual Wellness Visit - Calendar Year Insurers  Completed    Pneumococcal Vaccination, Age 88+  Completed    Osteoporosis screening  Discontinued    Colonoscopy  Discontinued    Covid-19 Vaccine  Discontinued     Medicare Wellness Assessment   Medicare initial or wellness physical in the last year?: Yes  Advance Directives   Does patient have a  living will or MPOA: No           Advance directive information given to the patient today?: Patient Declined      Activities of Daily Living   Do you need help with dressing, bathing, or walking?: No   Do you need help with shopping, housekeeping, medications, or finances?: No   Do you have rugs in hallways, broken steps, or poor lighting?: Yes   Do you have grab bars in your bathroom, non-slip strips in your tub, and hand rails on your stairs?: No   Cognitive Function Screen (1=Yes, 0=No)   What is you age?: Correct   What is the time to the nearest hour?: Correct   What is the year?: Correct   What is the name of this clinic?: Correct   Can the patient recognize two persons (the doctor, the nurse, home help, etc.)?: Correct   What is the date of your birth? (day and month sufficient) : Correct   In what year did World War II end?: Correct   Who is the current president of the [place] ?: Correct   Count from 20 down to 1?: Correct   What address did I give you earlier?: Correct   Total Score: 10   Interpretation of Total Score: Greater than 6 Normal   Fall Risk Screen   Do you  feel unsteady when standing or walking?: Yes  Do you worry about falling?: Yes  Have you fallen in the past year?: No   Depression Screen     Little interest or pleasure in doing things.: Not at all  Feeling down, depressed, or hopeless: Not at all  PHQ 2 Total: 0     Pain Score   Pain Score:   8    Substance Use-Abuse Screening     Tobacco Use     In Past 12 MONTHS, how often have you used any tobacco product (for example, cigarettes, e-cigarettes, cigars, pipes, or smokeless tobacco)?: Never     Alcohol use     In the PAST 12 MONTHS, how often have you had 5 (men)/4 (women) or more drinks containing alcohol in one day?: Never     Prescription Drug Use     In the PAST 12 months, how often have you used any prescription medications just for the feeling, more than prescribed, or that were not prescribed for you? Prescriptions may include: opioids, benzodiazepines, medications for ADHD: Never           Illicit Drug Use   In the PAST 12 MONTHS, how often have you used any drugs, including marijuana, cocaine or crack, heroin, methamphetamine, hallucinogens, ecstasy/MDMA?: Never            Urine Incontinence Screen   Urinary Incontinence Screen  Do you ever leak urine when you don't want to?: YES                      OBJECTIVE:   There were no vitals taken for this visit.       Other appropriate exam:    There are no preventive care reminders to display for this patient.     ASSESSMENT & PLAN:   Assessment/Plan   1. Medicare annual wellness visit, subsequent       Identified Risk Factors/ Recommended Actions     Fall Risk Follow up plan of care: Footwear and potential problems addressed  The PHQ 2 Total: 0 depression screen is interpreted as negative.  Urinary Incontinence Plan of Care: Behavioral interventions with bladder training, pelvic floor muscle training, and/or prompted voiding  Opioid use plan of care:         Opioids use: Plan: Assessment of pain completed and pain controlled  Continue current dose of Norco  7.5mg  q 8 hrs prn    Patient declined Advanced Directives information.        No orders of the defined types were placed in this encounter.       The patient has been educated about risk factors and recommended preventive care. Written Prevention Plan completed/ updated and given to patient (see After Visit Summary).    Post-Discharge Follow Up Appointments       Wednesday Jun 15, 2024    Return Patient Visit with Cloyce Darby, PA-C at 11:00 AM      Wednesday Jul 13, 2024    Return Patient Visit with Cloyce Darby, PA-C at 11:00 AM      Wednesday Aug 10, 2024    Return Patient Visit with Cloyce Darby, PA-C at 10:30 AM      Wednesday Sep 07, 2024    Return Patient Visit with Cloyce Darby, PA-C at 11:00 AM      Thursday May 25, 2025    Return Telephone Visit with Cloyce Darby, PA-C at  1:30 PM      [facility] Medicine, [facility]  [facility]  [address]  [place] [address]  [phone]           Medicare Preventive Services  Medicare coverage information Recommendation for YOU   Heart Disease and Diabetes   Lipid profile Every 5 years or more often if at risk for cardiovascular disease  Last Lipid Panel  (Last result in the past 2 years)        Cholesterol   HDL   LDL   Direct LDL   Triglycerides      04/20/24 1410 151   45   69     184             Diabetes Screening  yearly for those at risk for diabetes, 2 tests per year for those with prediabetes Last Glucose: 185    Diabetes Self Management Training or Medical Nutrition Therapy  For those with diabetes, up to 10 hrs initial training within a year, subsequent years up to 2 hrs of follow up training Optional for those with diabetes     Medical Nutrition Therapy Three hours of one-on-one counseling in first year, two hours in subsequent years Optional for those with diabetes, kidney disease   Intensive Behavioral Therapy for Obesity  Face-to-face counseling, first month every week, month 2-6 every other week, month 7-12 every month if continued progress is  documented Optional for those with Body Mass Index 30 or higher  Your There is no height or weight on file to calculate BMI.   Tobacco Cessation (Quitting) Counseling   Two attempts per year, max 4 sessions per attempt, up to 8 per year, for those with tobacco-related health condition Optional for those that use tobacco   Cancer Screening   Colorectal screening   For anyone age 62 to 41 or any age if high risk:  Screening Colonoscopy every 10 yrs if low risk,  more frequent if higher risk  OR  Cologuard Stool DNA test once every 3 years OR  Fecal Occult Blood Testing yearly OR  Flexible  Sigmoidoscopy  every 5 yr OR  CT Colonography  every 5 yrs    See your schedule below   Screening Pap Test Recommended every 3 years for all women age 7 to 73, or every five years if combined with HPV test (routine screening not needed after total hysterectomy).  Medicare covers every 2 years, up to yearly if high risk.  Screening Pelvic Exam Medicare covers every 2 years, yearly if high risk or childbearing age with abnormal Pap in last 3 yrs. See your schedule below   Screening Mammogram   Recommended every 2 years for women age 84 to 12, or more frequent if you have a higher risk. Selectively recommended for women between 40-49 based on shared decisions about risk. Covered by Medicare up to every year for women age 68 or older See your schedule below      Lung Cancer Screening  Annual low dose computed tomography (LDCT scan) is recommended for those age 55-77 who smoked 20 pack-years and are current smokers or quit smoking within past 15 years (one pack-year= smoking one PPD for one year), after counseling by your doctor or nurse clinician about the possible benefits or harms. See your schedule below   Vaccinations   Pneumococcal Vaccine: Recommended routinely age 41+ with one or two separate vaccines based on your risk    Recommended before age 40 if medical conditions with increased risk  Seasonal Influenza Vaccine: Once every  flu season   Hepatitis B Vaccine: 3 doses if risk (including anyone with diabetes or liver disease)  Shingles Vaccine: Two doses at age 49 or older  Diphtheria Tetanus Pertussis Vaccine: ONCE as adult, booster every 10 years     Immunization History   Administered Date(s) Administered    Covid-19 Vaccine,Pfizer-BioNTech,Purple Top,3yrs+ 02/17/2020, 03/09/2020, 03/09/2020, 02/21/2021    FLUZONE HD VACCINE (ADMIN) 09/16/2016, 09/27/2017    High-Dose Influenza Vaccine, 65+ 10/14/2020    Influenza Vaccine, 6 month-adult 10/10/2013, 09/20/2021    PNEUMOCOCCAL CONJ VACCINES 05/10/2004    PREVNAR 13 03/09/2017    Shingrix - Zoster Vaccine 02/27/2017, 07/20/2017    Td (Adult) 5 Lf Tetanus Toxoid, Preservative Free, Absorbed 08/22/2019    Tetanus Toxoid/Diphtheria Toxoid/Acellular Pertussis Vaccine, Adsorbed 08/22/2019     Shingles vaccine and Diphtheria Tetanus Pertussis vaccines are available at pharmacies or local health department without a prescription.   Other Screening   Bone Densitometry   Every 24 months for anyone at risk, including postmenopausal       Glaucoma Screening   Yearly if in high risk group such as diabetes, family history, African American age 61+ or Hispanic American age 41+   See your Eye Care Provider   Hepatitis C Screening recommended ONCE for those born between 1945-1965, or high risk for HCV infection       HIV Testing recommended routinely at least ONCE, covered every year for age 69 to 56 regardless of risk, and every year for age over 60 who ask for the test or higher risk  Yearly or up to 3 times in pregnancy         Abdominal Aortic Aneurysm Screening Ultrasound   Once between the age of 37-75 with a family history of AAA       Your Personalized Schedule for Preventive Tests     Health Maintenance: Pending and Last Completed         Date Due Completion Date    RSV Adult 60+ or Pregnancy (1 - 1-dose 75+ series) 12/29/2024 (Originally 01/09/2024) ---    Diabetic A1C 07/20/2024 04/20/2024  Influenza Vaccine (Season Ended) 08/22/2024 10/09/2022    Diabetic Retinal Exam 11/11/2024 11/12/2023    Override on 12/24/2021: Previously completed    Diabetic Kidney Health eGFR 04/20/2025 04/20/2024    Diabetic Kidney Health Microalb/Cr Ratio 04/20/2025 04/20/2024    Adult Tdap-Td (2 - Td or Tdap) 08/21/2029 08/22/2019               Non-Opioid Treatment for Chronic Pains     Treatment for chronic pain can be managed without opioids. Below are non-opioid options that may be considered and discussed with your provider to determine which options would be best for your health.    Over the counter or presciptions medications:  Acetaminophen  (Tylenol ) or Non-steroidal anti-inflammatories such as: Ibuprofen (Motrin, Advil), naproxen (Aleve), aspirin  Antidepressants such as amitriptyline, nortriptyline (Pamelor),  Doxepin (Silenor), Imipramine (Tofranil) and others.  Anticonvulsant Nerve pain medications: Gabapentin (Neurontin), pregabalin (Lyrica)  Externally applied medications such as NSAID'S, lidocaine, capsaisin, and others  Injections: pain specialists can sometimes inject medications at the site of pain.    Alternative therapies such as  Acupuncture  Osteopathic manipulation  Chiropractic  Massage therapy       Total time on visit 35 mins    Aviv Lengacher B Doil Kamara PA-C

## 2024-05-31 ENCOUNTER — Ambulatory Visit: Payer: Medicare Other | Admitting: Physician Assistant

## 2024-06-15 ENCOUNTER — Ambulatory Visit: Payer: Medicare Other | Attending: Physician Assistant | Admitting: Physician Assistant

## 2024-06-15 ENCOUNTER — Other Ambulatory Visit: Payer: Self-pay

## 2024-06-15 ENCOUNTER — Encounter (INDEPENDENT_AMBULATORY_CARE_PROVIDER_SITE_OTHER): Payer: Self-pay | Admitting: Physician Assistant

## 2024-06-15 ENCOUNTER — Other Ambulatory Visit (INDEPENDENT_AMBULATORY_CARE_PROVIDER_SITE_OTHER): Payer: Self-pay | Admitting: Physician Assistant

## 2024-06-15 VITALS — BP 112/62 | HR 80 | Resp 18

## 2024-06-15 DIAGNOSIS — Z7984 Long term (current) use of oral hypoglycemic drugs: Secondary | ICD-10-CM

## 2024-06-15 DIAGNOSIS — F32A Depression, unspecified: Secondary | ICD-10-CM | POA: Insufficient documentation

## 2024-06-15 DIAGNOSIS — E1165 Type 2 diabetes mellitus with hyperglycemia: Secondary | ICD-10-CM | POA: Insufficient documentation

## 2024-06-15 DIAGNOSIS — I10 Essential (primary) hypertension: Secondary | ICD-10-CM | POA: Insufficient documentation

## 2024-06-15 DIAGNOSIS — I129 Hypertensive chronic kidney disease with stage 1 through stage 4 chronic kidney disease, or unspecified chronic kidney disease: Secondary | ICD-10-CM

## 2024-06-15 DIAGNOSIS — M545 Low back pain, unspecified: Secondary | ICD-10-CM | POA: Insufficient documentation

## 2024-06-15 DIAGNOSIS — E538 Deficiency of other specified B group vitamins: Secondary | ICD-10-CM | POA: Insufficient documentation

## 2024-06-15 DIAGNOSIS — Z7985 Long-term (current) use of injectable non-insulin antidiabetic drugs: Secondary | ICD-10-CM

## 2024-06-15 DIAGNOSIS — F419 Anxiety disorder, unspecified: Secondary | ICD-10-CM | POA: Insufficient documentation

## 2024-06-15 DIAGNOSIS — N390 Urinary tract infection, site not specified: Secondary | ICD-10-CM | POA: Insufficient documentation

## 2024-06-15 DIAGNOSIS — N182 Chronic kidney disease, stage 2 (mild): Secondary | ICD-10-CM | POA: Insufficient documentation

## 2024-06-15 DIAGNOSIS — G8929 Other chronic pain: Secondary | ICD-10-CM | POA: Insufficient documentation

## 2024-06-15 LAB — COMPREHENSIVE METABOLIC PANEL, NON-FASTING
ALBUMIN/GLOBULIN RATIO: 1.3 (ref 0.8–1.4)
ALBUMIN: 4.3 g/dL (ref 3.5–5.7)
ALKALINE PHOSPHATASE: 62 U/L (ref 34–104)
ALT (SGPT): 16 U/L (ref 7–52)
ANION GAP: 8 mmol/L (ref 4–13)
AST (SGOT): 21 U/L (ref 13–39)
BILIRUBIN TOTAL: 0.4 mg/dL (ref 0.3–1.0)
BUN/CREA RATIO: 17 (ref 6–22)
BUN: 16 mg/dL (ref 7–25)
CALCIUM, CORRECTED: 9.7 mg/dL (ref 8.9–10.8)
CALCIUM: 9.9 mg/dL (ref 8.6–10.3)
CHLORIDE: 106 mmol/L (ref 98–107)
CO2 TOTAL: 27 mmol/L (ref 21–31)
CREATININE: 0.94 mg/dL (ref 0.60–1.30)
ESTIMATED GFR: 63 mL/min/{1.73_m2} (ref >59)
GLOBULIN: 3.3 (ref 2.0–3.5)
GLUCOSE: 143 mg/dL — ABNORMAL HIGH (ref 74–109)
OSMOLALITY, CALCULATED: 285 mosm/kg (ref 270–290)
POTASSIUM: 4.1 mmol/L (ref 3.5–5.1)
PROTEIN TOTAL: 7.6 g/dL (ref 6.4–8.9)
SODIUM: 141 mmol/L (ref 136–145)

## 2024-06-15 LAB — URINALYSIS, MACROSCOPIC
BILIRUBIN: NEGATIVE mg/dL
BLOOD: NEGATIVE mg/dL
GLUCOSE: >1000 mg/dL — AB
KETONES: NEGATIVE mg/dL
LEUKOCYTES: NEGATIVE WBCs/uL
NITRITE: NEGATIVE
PH: 5 (ref 5.0–9.0)
PROTEIN: NEGATIVE mg/dL
SPECIFIC GRAVITY: 1.036 — ABNORMAL HIGH (ref 1.002–1.030)
UROBILINOGEN: NORMAL mg/dL

## 2024-06-15 LAB — URINALYSIS, MICROSCOPIC
RBCS: 2 /HPF (ref <4)
SQUAMOUS EPITHELIAL: 1 /HPF (ref <28)
WBCS: 4 /HPF (ref <6)

## 2024-06-15 LAB — MAGNESIUM: MAGNESIUM: 1.9 mg/dL (ref 1.9–2.7)

## 2024-06-15 LAB — THYROID STIMULATING HORMONE WITH FREE T4 REFLEX: TSH: 0.779 u[IU]/mL (ref 0.450–5.330)

## 2024-06-15 MED ORDER — CYANOCOBALAMIN (VIT B-12) 1,000 MCG/ML INJECTION SOLUTION
1000.0000 ug | INTRAMUSCULAR | Status: AC
Start: 2024-06-15 — End: 2024-06-15
  Administered 2024-06-15: 1000 ug via SUBCUTANEOUS

## 2024-06-15 NOTE — Assessment & Plan Note (Signed)
 Continue valium  5mg  bid- refills given

## 2024-06-15 NOTE — Assessment & Plan Note (Signed)
 Continue Mag-Ox 400 mg b.i.d.

## 2024-06-15 NOTE — Assessment & Plan Note (Signed)
 Will check renal function in lab today

## 2024-06-15 NOTE — Assessment & Plan Note (Signed)
 BP stable on current meds  Continue monitoring

## 2024-06-15 NOTE — Assessment & Plan Note (Signed)
 Repeat urine obtained today neg

## 2024-06-15 NOTE — Assessment & Plan Note (Signed)
 B12 injection given

## 2024-06-15 NOTE — Assessment & Plan Note (Signed)
 Labs obtained in office   Continue current meds along w diet BS monitoring

## 2024-06-15 NOTE — Assessment & Plan Note (Signed)
 On chronic pain management  Continue Norco 7.5 mg q.8 hours-refills given

## 2024-06-15 NOTE — Assessment & Plan Note (Signed)
 Continue Effexor  qd

## 2024-06-15 NOTE — Progress Notes (Signed)
 [facility]  [address]  [place] [address]          Follow Up        Reason for Visit: Follow Up B12 shot    History of Present Illness  Grace Hoffman is a 73 y.o. female who is being seen today in office for  f/u concerning   Her BS w history of uncontrolled diabetes.  Last hemoglobin was 9.1 however previously 11.9. Currently patient is on metformin  at 1000 mg twice daily along  Toujeo  35 units nightly ozempic  0.5mg  weekly jardiance  25 mg daily and Humalog  5 units w meals.    States blood sugar is a little bit better and  Denies increased thirst or urination.   She did see her ortho and needs to get A1c down in order to get surgery on her shoulder.  Has some Lab orders he is requesting to be drawn today in office.  Hx of CKD stage 2 as well noted w last labs improved and bun 19 cr 0.90 and gfr 67. Continues following w nephrologist. Previously GFR had been down in the 50's but recent improvement noted. Pt denies any urinary sx.    Follow-up blood pressure/hypertension w BP currently stable at 112/62.  She continues taking Vasotec  10 mg daily as well as hydrochlorothiazide  25 mg daily p.r.n.SABRA   Denies chest pain.  She does continue to have chronic dizziness for which she is seeing the neurologist and has had a MRI of her brain which noted signs of TIAs.  Pt has Occasional headaches noted and has had eval by neurologist as well in the past which was unremarkable. She has  also has been seen by the ENT where she had her crystals realigned.  She states this helped a little bit but she feels like her balance is still an issue.     Follow-up B12 deficiency as well as magnesium  deficiency with chronic fatigue issues noted.  Patient is requesting B12 injection today which do seem to help with her energy level.  Last B12 level when checked in lab stable however.     Follow-up recurrent UTI which patient at this time denies any urinary symptoms.    Follow-up urinalysis needed today.      Follow-up chronic pain/DJD/fibromyalgia for which she has been on chronic pain therapy/narcotics for numerous years.  Patient had reported increased pain in her lower back with x-rays showing moderate DJD the MRI of spine ordered.  She did have an MRI and  was referred to [facility] neurosurgeon.  She also is currently taking Norco 7.5 mg q.6 hours p.r.n. which does help with her chronic pain issues.  She denies any side effects to medication and compliant w urine drug screen.  Requesting medication refill.     Follow-up anxiety/depression with patient currently on Valium  5 mg daily p.r.n. as well as Effexor  37.5 mg daily.  Patient denies panic attack and states anxiety depression overall stable at this current time.    PHQ Questionnaire               Past Medical History:   Diagnosis Date    Anxiety     Chronic back pain     Chronic pain 04/03/2022    COVID-19 vaccine series completed     Depression     Diabetes mellitus, type 2     Fibromyalgia affecting multiple sites     Hypertension     Hypomagnesemia  Insomnia disorder     Migraine     Mixed hyperlipidemia     Orthostatic hypotension     Osteoarthritis     TIA (transient ischemic attack)     Unspecified glaucoma(365.9)     Vitamin D  deficiency          Past Surgical History:   Procedure Laterality Date    HX CHOLECYSTECTOMY      HX KNEE REPLACMENT Left     HX ROTATOR CUFF REPAIR Left     SKIN CANCER EXCISION      forehead removed      Family Medical History:       Problem Relation (Age of Onset)    Kidney failure Mother    No Known Problems Sister, Brother, Half-Sister, Half-Brother, Maternal Aunt, Maternal Uncle, Paternal Aunt, Paternal Uncle, Maternal Grandmother, Maternal Grandfather, Paternal Grandmother, Paternal Grandfather, Daughter, Son    Respiratory Problems Father            Social History     Tobacco Use    Smoking status: Never    Smokeless tobacco: Never   Vaping Use    Vaping status: Never Used   Substance Use Topics    Alcohol use: Never     Drug use: Never     Medication:  Blood-Glucose Meter,Continuous (FREESTYLE LIBRE 3 READER) Does not apply Misc, 1 Each Every 6 hours as needed  Blood-Glucose Sensor (FREESTYLE LIBRE 3 SENSOR) Does not apply Device, 1 Device Every 14 days  d-mannose 500 mg Oral Capsule, Take 1 Capsule (500 mg total) by mouth Twice daily  diazePAM  (VALIUM ) 5 mg Oral Tablet, Take 1 Tablet (5 mg total) by mouth Every 12 hours as needed for Anxiety  empagliflozin  (JARDIANCE ) 25 mg Oral Tablet, Take 1 Tablet (25 mg total) by mouth Once a day  enalapril  maleate (VASOTEC ) 10 mg Oral Tablet, Take 1 Tablet (10 mg total) by mouth Daily  ergocalciferol , vitamin D2, (DRISDOL ) 1,250 mcg (50,000 unit) Oral Capsule, Take 1 Capsule (50,000 Units total) by mouth Every 7 days  HYDROcodone -acetaminophen  (NORCO) 7.5-325 mg Oral Tablet, Take 1 Tablet by mouth Every 8 hours as needed for up to 30 days Indications: pain  insulin  glargine U-300 conc (TOUJEO  MAX U-300 SOLOSTAR) 300 unit/mL (3 mL) Subcutaneous Insulin  Pen, Inject 35 Units under the skin Every night  insulin  lispro (HUMALOG  KWIKPEN INSULIN ) 100 unit/mL Subcutaneous Insulin  Pen, Use as directed sliding scale; also use 5 units with meals Indications: type 2 diabetes mellitus  latanoprost  (XALATAN ) 0.005 % Ophthalmic Drops, Administer 1 Drop into the left eye Every night  Levetiracetam  500 mg Oral Tablet Sustained Release 24 hr, Take 1 Tablet (500 mg total) by mouth Every night  magnesium  oxide (MAG-OX) 400 mg Oral Tablet, Take 1 Tablet (400 mg total) by mouth Twice daily  MetFORMIN  (GLUCOPHAGE ) 1,000 mg Oral Tablet, Take 1 Tablet (1,000 mg total) by mouth Twice daily with food  ondansetron  (ZOFRAN  ODT) 4 mg Oral Tablet, Rapid Dissolve, Take 1 Tablet (4 mg total) by mouth Every 8 hours as needed for Nausea/Vomiting  rosuvastatin  (CRESTOR ) 20 mg Oral Tablet, Take 1 Tablet (20 mg total) by mouth Every evening  semaglutide  (OZEMPIC ) 0.25 mg or 0.5 mg (2 mg/3 mL) Subcutaneous Pen Injector, Inject 0.5  mg under the skin Every 7 days  solifenacin  (VESICARE ) 5 mg Oral Tablet, Take 1 Tablet (5 mg total) by mouth Once a day  SUMAtriptan  (IMITREX ) 50 mg Oral Tablet, Take 1 Tablet (50 mg total) by mouth Once,  as needed for Migraine TAKE 1 TABLET BY MOUTH AT ONSET OF HEADACHE. IF NO RELIEF MAY REPEAT 1 AFTER AT LEAST 2 HOURS. MAX OF 4 TABS PER 24 HOURS  venlafaxine  (EFFEXOR  XR) 37.5 mg Oral Capsule, Sust. Release 24 hr, Take 1 Capsule (37.5 mg total) by mouth Every night    No facility-administered medications prior to visit.    Allergies:  Allergies   Allergen Reactions    Codeine Nausea/ Vomiting    Amoxicillin Diarrhea    Macrobid  [Nitrofurantoin  Monohyd/M-Cryst] Nausea/ Vomiting       Physical Exam:  Vitals:    06/15/24 1142   BP: 112/62   Pulse: 80   Resp: 18   SpO2: 96%      Physical Exam  Vitals and nursing note reviewed.   Constitutional:       Appearance: Normal appearance. She is obese.   HENT:      Right Ear: Tympanic membrane normal.      Left Ear: Tympanic membrane normal.      Mouth/Throat:      Mouth: Mucous membranes are moist.      Pharynx: Oropharynx is clear.   Eyes:      Extraocular Movements: Extraocular movements intact.      Pupils: Pupils are equal, round, and reactive to light.   Cardiovascular:      Rate and Rhythm: Normal rate and regular rhythm.      Pulses: Normal pulses.   Pulmonary:      Effort: Pulmonary effort is normal.      Breath sounds: Normal breath sounds.   Abdominal:      General: Bowel sounds are normal.      Palpations: Abdomen is soft.      Tenderness: There is no abdominal tenderness. There is no right CVA tenderness, left CVA tenderness or rebound.   Musculoskeletal:         General: No swelling or tenderness. Normal range of motion.      Cervical back: Normal range of motion and neck supple.      Comments: Arthritic changes hands and knees noted bilateral  Chronic trigger points upper back and thigh area  Chronic low back pain with range of motion  Negative straight leg  raises  Gait stable   Skin:     General: Skin is warm.      Findings: No lesion or rash.   Neurological:      General: No focal deficit present.      Mental Status: She is alert and oriented to person, place, and time.      Sensory: No sensory deficit.      Motor: Weakness (Strength of upper and lower extremities decreased however equal bilateral) present.      Gait: Gait normal.   Psychiatric:         Mood and Affect: Mood normal.         Behavior: Behavior normal.         Thought Content: Thought content normal.         Judgment: Judgment normal.     Assessment & Plan  Uncontrolled type 2 diabetes mellitus with hyperglycemia (CMS HCC)  Labs obtained in office   Continue current meds along w diet BS monitoring  B12 deficiency  B12 injection given  Essential hypertension  BP stable on current meds  Continue monitoring  Chronic low back pain, unspecified back pain laterality, unspecified whether sciatica present  On chronic pain management  Continue Norco 7.5  mg q.8 hours-refills given  Hypomagnesemia  Continue Mag-Ox 400 mg b.i.d.  Recurrent UTI  Repeat urine obtained today neg  Chronic kidney disease (CKD), stage II (mild)  Will check renal function in lab today  Anxiety  Continue valium  5mg  bid- refills given  Depression, unspecified depression type  Continue Effexor  qd     Orders Placed This Encounter    MAGNESIUM     HGA1C (HEMOGLOBIN A1C WITH EST AVG GLUCOSE)    COMPREHENSIVE METABOLIC PANEL, NON-FASTING    THYROID  STIMULATING HORMONE WITH FREE T4 REFLEX    FRUCTOSAMINE, SERUM    URINALYSIS, MACROSCOPIC    URINALYSIS, MICROSCOPIC    cyanocobalamin  (VITAMIN B12) 1000 mcg/mL injection         Post-Discharge Follow Up Appointments       Wednesday Jul 13, 2024    Return Patient Visit with Scharlene No, PA-C at 11:00 AM      Wednesday Aug 10, 2024    Return Patient Visit with Scharlene No, PA-C at 10:30 AM      Wednesday Sep 07, 2024    Return Patient Visit with Scharlene No, PA-C at 11:00 AM      Thursday May 25, 2025     Return Telephone Visit with Scharlene No, PA-C at  1:30 PM      [facility]  [facility]  [address]  [place] [address]  [phone]           Seek medical attention for new or worsening symptoms.  Patient has been seen in this clinic within the last 3 years.     Laiklynn Raczynski, PA-C          This note was partially created using MModal Fluency Direct system (voice recognition software) and is inherently subject to errors including those of syntax and sound-alike substitutions which may escape proofreading.  In such instances, original meaning may be extrapolated by contextual derivation.

## 2024-06-15 NOTE — Nursing Note (Signed)
 1 month follow up for medication refill.

## 2024-06-16 LAB — HGA1C (HEMOGLOBIN A1C WITH EST AVG GLUCOSE): HEMOGLOBIN A1C: 7.6 % — ABNORMAL HIGH (ref 4.0–6.0)

## 2024-06-16 MED ORDER — HYDROCODONE 7.5 MG-ACETAMINOPHEN 325 MG TABLET
1.0000 | ORAL_TABLET | Freq: Three times a day (TID) | ORAL | 0 refills | Status: DC | PRN
Start: 2024-06-16 — End: 2024-07-13

## 2024-06-17 ENCOUNTER — Ambulatory Visit (INDEPENDENT_AMBULATORY_CARE_PROVIDER_SITE_OTHER): Payer: Self-pay

## 2024-06-17 MED ORDER — OZEMPIC 1 MG/DOSE (4 MG/3 ML) SUBCUTANEOUS PEN INJECTOR: 1.0000 mg | PEN_INJECTOR | SUBCUTANEOUS | 1 refills | Status: DC

## 2024-07-13 ENCOUNTER — Other Ambulatory Visit (INDEPENDENT_AMBULATORY_CARE_PROVIDER_SITE_OTHER): Payer: Self-pay | Admitting: Physician Assistant

## 2024-07-13 ENCOUNTER — Encounter (INDEPENDENT_AMBULATORY_CARE_PROVIDER_SITE_OTHER): Payer: Self-pay | Admitting: Physician Assistant

## 2024-07-13 ENCOUNTER — Ambulatory Visit: Payer: Self-pay | Attending: Physician Assistant | Admitting: Physician Assistant

## 2024-07-13 ENCOUNTER — Other Ambulatory Visit: Payer: Self-pay

## 2024-07-13 VITALS — BP 116/70 | HR 81 | Ht 65.0 in | Wt 177.0 lb

## 2024-07-13 DIAGNOSIS — Z7984 Long term (current) use of oral hypoglycemic drugs: Secondary | ICD-10-CM | POA: Insufficient documentation

## 2024-07-13 DIAGNOSIS — F419 Anxiety disorder, unspecified: Secondary | ICD-10-CM | POA: Insufficient documentation

## 2024-07-13 DIAGNOSIS — G8929 Other chronic pain: Secondary | ICD-10-CM | POA: Insufficient documentation

## 2024-07-13 DIAGNOSIS — E1165 Type 2 diabetes mellitus with hyperglycemia: Secondary | ICD-10-CM | POA: Insufficient documentation

## 2024-07-13 DIAGNOSIS — I129 Hypertensive chronic kidney disease with stage 1 through stage 4 chronic kidney disease, or unspecified chronic kidney disease: Secondary | ICD-10-CM | POA: Insufficient documentation

## 2024-07-13 DIAGNOSIS — I10 Essential (primary) hypertension: Secondary | ICD-10-CM

## 2024-07-13 DIAGNOSIS — Z79899 Other long term (current) drug therapy: Secondary | ICD-10-CM | POA: Insufficient documentation

## 2024-07-13 DIAGNOSIS — Z7985 Long-term (current) use of injectable non-insulin antidiabetic drugs: Secondary | ICD-10-CM | POA: Insufficient documentation

## 2024-07-13 DIAGNOSIS — E538 Deficiency of other specified B group vitamins: Secondary | ICD-10-CM | POA: Insufficient documentation

## 2024-07-13 DIAGNOSIS — M797 Fibromyalgia: Secondary | ICD-10-CM | POA: Insufficient documentation

## 2024-07-13 DIAGNOSIS — R5382 Chronic fatigue, unspecified: Secondary | ICD-10-CM | POA: Insufficient documentation

## 2024-07-13 DIAGNOSIS — M545 Low back pain, unspecified: Secondary | ICD-10-CM | POA: Insufficient documentation

## 2024-07-13 DIAGNOSIS — Z794 Long term (current) use of insulin: Secondary | ICD-10-CM | POA: Insufficient documentation

## 2024-07-13 DIAGNOSIS — E612 Magnesium deficiency: Secondary | ICD-10-CM | POA: Insufficient documentation

## 2024-07-13 DIAGNOSIS — M79602 Pain in left arm: Secondary | ICD-10-CM | POA: Insufficient documentation

## 2024-07-13 DIAGNOSIS — E1122 Type 2 diabetes mellitus with diabetic chronic kidney disease: Secondary | ICD-10-CM | POA: Insufficient documentation

## 2024-07-13 DIAGNOSIS — N182 Chronic kidney disease, stage 2 (mild): Secondary | ICD-10-CM | POA: Insufficient documentation

## 2024-07-13 DIAGNOSIS — M1990 Unspecified osteoarthritis, unspecified site: Secondary | ICD-10-CM | POA: Insufficient documentation

## 2024-07-13 MED ORDER — HYDROCODONE 7.5 MG-ACETAMINOPHEN 325 MG TABLET
1.0000 | ORAL_TABLET | Freq: Three times a day (TID) | ORAL | 0 refills | Status: DC | PRN
Start: 2024-07-13 — End: 2024-08-10

## 2024-07-13 MED ORDER — CYANOCOBALAMIN (VIT B-12) 1,000 MCG/ML INJECTION SOLUTION
1000.0000 ug | Freq: Once | INTRAMUSCULAR | Status: AC
Start: 2024-07-13 — End: 2024-07-13
  Administered 2024-07-13: 1000 ug via INTRAMUSCULAR

## 2024-07-13 MED ORDER — DIAZEPAM 5 MG TABLET
5.0000 mg | ORAL_TABLET | Freq: Two times a day (BID) | ORAL | 0 refills | Status: DC | PRN
Start: 2024-07-13 — End: 2024-08-10

## 2024-07-13 NOTE — Nursing Note (Signed)
 Patient is in the office today for routine 1 month follow up for medication refill. Patient requests B12 injection today.

## 2024-07-13 NOTE — Nursing Note (Signed)
 1 month follow up for med refill.

## 2024-07-13 NOTE — Progress Notes (Signed)
 [facility]  [address]  [place] [address]          Follow Up        Reason for Visit: Follow Up (Routine 1 month ) and Medication Refill B12 shot    This note was created with assistance from [employer] via capture of conversational audio.  Consent was obtained from the patient prior to recording.      History of Present Illness  Grace Hoffman is a 73 year old female with diabetes who presents with arm pain and follow-up on lab results.     She experiences significant pain in her left arm, describing it as 'killing me'.  Currently seeing Dr. Orlando ortho concerning issues which he states he wants to perform surgery but she has to have her sugar under control.  She requests today a refill for her routine pain medication.    She has a history of uncontrolled diabetes in his currently taking Jardiance  25 mg daily metformin  a 1000 mg b.i.d. Toujeo  35 units nightly and Ozempic  1 mg weekly.  She also has lost weight using the Ozempic  with weight today 177 lb down from previous 185.  She recently underwent lab tests, which included an A1c test showing a level of 7.6 and a fructosamine level of 321, both elevated. She is unsure if the results were sent to Dr. Orlando and has not received any follow-up communication regarding the results.     She also has a Hx of CKD stage 2 for which he Continues following w nephrologist. Previously GFR had been down in the 50's but recent improvement noted. Pt denies any urinary sx.    Follow-up blood pressure/hypertension w BP currently stable at [address].  She continues taking Vasotec  10 mg daily as well as hydrochlorothiazide  25 mg daily p.r.n.SABRA   Denies chest pain.  She does continue to have chronic dizziness for which she is seeing the neurologist and has had a MRI of her brain which noted signs of TIAs.  Pt has Occasional headaches noted and has had eval by neurologist as well in the past which was unremarkable. She has  also has been seen by the  ENT where she had her crystals realigned.  She states this helped a little bit but she feels like her balance is still an issue.     Follow-up B12 deficiency as well as magnesium  deficiency with chronic fatigue issues noted.  Patient is requesting B12 injection today which do seem to help with her energy level.  Last B12 level when checked in lab stable however.     Follow-up recurrent UTI which patient at this time denies any urinary symptoms.    Follow-up urinalysis needed today.     Follow-up chronic pain/DJD/fibromyalgia for which she has been on chronic pain therapy/narcotics for numerous years.  Patient had reported increased pain in her lower back with x-rays showing moderate DJD the MRI of spine ordered.  She did have an MRI and  was referred to [facility] neurosurgeon.  She also is currently taking Norco 7.5 mg q.6 hours p.r.n. which does help with her chronic pain issues.  She denies any side effects to medication and compliant w urine drug screen.  Requesting medication refill.     Follow-up anxiety/depression with patient currently on Valium  5 mg daily p.r.n. as well as Effexor  37.5 mg daily.  Patient denies panic attack and states anxiety depression overall stable at this current time.    PHQ Questionnaire  Past Medical History:   Diagnosis Date    Anxiety     Chronic back pain     Chronic pain 04/03/2022    COVID-19 vaccine series completed     Depression     Diabetes mellitus, type 2     Fibromyalgia affecting multiple sites     Hypertension     Hypomagnesemia     Insomnia disorder     Migraine     Mixed hyperlipidemia     Orthostatic hypotension     Osteoarthritis     TIA (transient ischemic attack)     Unspecified glaucoma(365.9)     Vitamin D  deficiency          Past Surgical History:   Procedure Laterality Date    HX CHOLECYSTECTOMY      HX KNEE REPLACMENT Left     HX ROTATOR CUFF REPAIR Left     SKIN CANCER EXCISION      forehead removed      Family Medical History:       Problem  Relation (Age of Onset)    Kidney failure Mother    No Known Problems Sister, Brother, Half-Sister, Half-Brother, Maternal Aunt, Maternal Uncle, Paternal Aunt, Paternal Uncle, Maternal Grandmother, Maternal Grandfather, Paternal Grandmother, Paternal Grandfather, Daughter, Son    Respiratory Problems Father            Social History     Tobacco Use    Smoking status: Never    Smokeless tobacco: Never   Vaping Use    Vaping status: Never Used   Substance Use Topics    Alcohol use: Never    Drug use: Never     Medication:  Blood-Glucose Meter,Continuous (FREESTYLE LIBRE 3 READER) Does not apply Misc, 1 Each Every 6 hours as needed  Blood-Glucose Sensor (FREESTYLE LIBRE 3 SENSOR) Does not apply Device, 1 Device Every 14 days  d-mannose 500 mg Oral Capsule, Take 1 Capsule (500 mg total) by mouth Twice daily  empagliflozin  (JARDIANCE ) 25 mg Oral Tablet, Take 1 Tablet (25 mg total) by mouth Once a day  enalapril  maleate (VASOTEC ) 10 mg Oral Tablet, Take 1 Tablet (10 mg total) by mouth Daily  insulin  glargine U-300 conc (TOUJEO  MAX U-300 SOLOSTAR) 300 unit/mL (3 mL) Subcutaneous Insulin  Pen, Inject 35 Units under the skin Every night  insulin  lispro (HUMALOG  KWIKPEN INSULIN ) 100 unit/mL Subcutaneous Insulin  Pen, Use as directed sliding scale; also use 5 units with meals Indications: type 2 diabetes mellitus  Levetiracetam  500 mg Oral Tablet Sustained Release 24 hr, Take 1 Tablet (500 mg total) by mouth Every night  magnesium  oxide (MAG-OX) 400 mg Oral Tablet, Take 1 Tablet (400 mg total) by mouth Twice daily  MetFORMIN  (GLUCOPHAGE ) 1,000 mg Oral Tablet, Take 1 Tablet (1,000 mg total) by mouth Twice daily with food  ondansetron  (ZOFRAN  ODT) 4 mg Oral Tablet, Rapid Dissolve, Take 1 Tablet (4 mg total) by mouth Every 8 hours as needed for Nausea/Vomiting  rosuvastatin  (CRESTOR ) 20 mg Oral Tablet, Take 1 Tablet (20 mg total) by mouth Every evening  semaglutide  (OZEMPIC ) 1 mg/dose (4 mg/3 mL) Subcutaneous Pen Injector, Inject 1  mg under the skin Every 7 days  solifenacin  (VESICARE ) 5 mg Oral Tablet, Take 1 Tablet (5 mg total) by mouth Once a day  SUMAtriptan  (IMITREX ) 50 mg Oral Tablet, Take 1 Tablet (50 mg total) by mouth Once, as needed for Migraine TAKE 1 TABLET BY MOUTH AT ONSET OF HEADACHE. IF NO RELIEF MAY REPEAT 1 AFTER  AT LEAST 2 HOURS. MAX OF 4 TABS PER 24 HOURS  diazePAM  (VALIUM ) 5 mg Oral Tablet, Take 1 Tablet (5 mg total) by mouth Every 12 hours as needed for Anxiety  ergocalciferol , vitamin D2, (DRISDOL ) 1,250 mcg (50,000 unit) Oral Capsule, Take 1 Capsule (50,000 Units total) by mouth Every 7 days  HYDROcodone -acetaminophen  (NORCO) 7.5-325 mg Oral Tablet, Take 1 Tablet by mouth Every 8 hours as needed for up to 30 days Indications: pain  latanoprost  (XALATAN ) 0.005 % Ophthalmic Drops, Administer 1 Drop into the left eye Every night  venlafaxine  (EFFEXOR  XR) 37.5 mg Oral Capsule, Sust. Release 24 hr, Take 1 Capsule (37.5 mg total) by mouth Every night    No facility-administered medications prior to visit.    Allergies:  Allergies   Allergen Reactions    Codeine Nausea/ Vomiting    Amoxicillin Diarrhea    Macrobid  [Nitrofurantoin  Monohyd/M-Cryst] Nausea/ Vomiting       Physical Exam:  Vitals:    07/13/24 1135   BP: 116/70   Pulse: 81   SpO2: 96%   Weight: 80.3 kg (177 lb)   Height: 1.651 m (5' 5)   BMI: 29.45        Physical Exam  Vitals and nursing note reviewed.   Constitutional:       Appearance: Normal appearance. She is obese.   HENT:      Right Ear: Tympanic membrane normal.      Left Ear: Tympanic membrane normal.      Mouth/Throat:      Mouth: Mucous membranes are moist.      Pharynx: Oropharynx is clear.   Eyes:      Extraocular Movements: Extraocular movements intact.      Pupils: Pupils are equal, round, and reactive to light.   Cardiovascular:      Rate and Rhythm: Normal rate and regular rhythm.      Pulses: Normal pulses.   Pulmonary:      Effort: Pulmonary effort is normal.      Breath sounds: Normal breath  sounds.   Abdominal:      General: Bowel sounds are normal.      Palpations: Abdomen is soft.      Tenderness: There is no abdominal tenderness. There is no right CVA tenderness, left CVA tenderness or rebound.   Musculoskeletal:         General: No swelling or tenderness.      Cervical back: Normal range of motion and neck supple.      Comments:   Tenderness on palpation left lateral deltoid down into mid humerus  Decreased range of motion left shoulder due to pain   Arthritic changes hands and knees noted bilateral  Chronic trigger points upper back and thigh area  Chronic low back pain with range of motion  Negative straight leg raises  Gait stable   Skin:     General: Skin is warm.      Findings: No lesion or rash.   Neurological:      General: No focal deficit present.      Mental Status: She is alert and oriented to person, place, and time.      Sensory: No sensory deficit.      Motor: Weakness (Strength of upper and lower extremities decreased however equal bilateral) present.      Gait: Gait normal.   Psychiatric:         Mood and Affect: Mood normal.  Behavior: Behavior normal.         Thought Content: Thought content normal.         Judgment: Judgment normal.     Assessment & Plan  Uncontrolled type 2 diabetes mellitus with hyperglycemia (CMS HCC)  A1c at 7.6 and fructosamine at 321 indicate suboptimal control. Ozempic  aids in weight loss and glycemic control despite causing nausea and vomiting.  - Continue Ozempic  Jardiance  metformin  and Toujeo  as directed  - Confirm with Dr. Norleen Ellen regarding lab results and surgery scheduling.  - Provide lab results copy to her.    Chronic kidney disease (CKD), stage II (mild)  -renal function stable  -continue following with nephrologist  Essential hypertension  -BP stable on current meds  -Continue  blood pressure monitoring  B12 deficiency  -B12 injection administered  Chronic low back pain, unspecified back pain laterality, unspecified whether sciatica  present  -On chronic pain management  -Continue Norco 7.5 mg q.8 hours-refills given  Fibromyalgia  -On chronic pain management  -Continue Norco 7.5 mg q.8 hours-refills given  Anxiety  -controlled on benzo therapy  -continue valium  5mg  bid- refills given       Orders Placed This Encounter    cyanocobalamin  (VITAMIN B12) 1000 mcg/mL injection       Post-Discharge Follow Up Appointments       Wednesday Sep 14, 2024    Return Patient Visit with Scharlene No, PA-C at 11:15 AM      Monday Oct 10, 2024    Return Patient Visit with Scharlene No, PA-C at 11:30 AM      Monday Nov 07, 2024    Return Patient Visit with Scharlene No, PA-C at 11:00 AM      Wednesday Dec 07, 2024    Return Patient Visit with Scharlene No, PA-C at 10:00 AM      Thursday May 25, 2025    Return Telephone Visit with Scharlene No, PA-C at  1:30 PM      [facility]  [facility]  [address]  [place] [address]  [phone]              Seek medical attention for new or worsening symptoms.  Patient has been seen in this clinic within the last 3 years.     Amy Scharlene, PA-C      I personally reviewed the documentation of care provided by the certified physician assistant and I agree with her medical decision making, Jhonny Romano, D.O.       This note was partially created using MModal Fluency Direct system (voice recognition software) and is inherently subject to errors including those of syntax and sound-alike substitutions which may escape proofreading.  In such instances, original meaning may be extrapolated by contextual derivation.

## 2024-08-10 ENCOUNTER — Encounter (INDEPENDENT_AMBULATORY_CARE_PROVIDER_SITE_OTHER): Payer: Self-pay | Admitting: Physician Assistant

## 2024-08-10 ENCOUNTER — Other Ambulatory Visit (INDEPENDENT_AMBULATORY_CARE_PROVIDER_SITE_OTHER): Payer: Self-pay | Admitting: Physician Assistant

## 2024-08-10 ENCOUNTER — Other Ambulatory Visit: Payer: Self-pay

## 2024-08-10 ENCOUNTER — Ambulatory Visit: Attending: Physician Assistant | Admitting: Physician Assistant

## 2024-08-10 ENCOUNTER — Ambulatory Visit (INDEPENDENT_AMBULATORY_CARE_PROVIDER_SITE_OTHER): Payer: Self-pay | Admitting: Physician Assistant

## 2024-08-10 VITALS — BP 104/74 | HR 85 | Ht 65.0 in | Wt 173.0 lb

## 2024-08-10 DIAGNOSIS — E538 Deficiency of other specified B group vitamins: Secondary | ICD-10-CM | POA: Insufficient documentation

## 2024-08-10 DIAGNOSIS — I10 Essential (primary) hypertension: Secondary | ICD-10-CM | POA: Insufficient documentation

## 2024-08-10 DIAGNOSIS — Z7984 Long term (current) use of oral hypoglycemic drugs: Secondary | ICD-10-CM

## 2024-08-10 DIAGNOSIS — N390 Urinary tract infection, site not specified: Secondary | ICD-10-CM | POA: Insufficient documentation

## 2024-08-10 DIAGNOSIS — F39 Unspecified mood [affective] disorder: Secondary | ICD-10-CM | POA: Insufficient documentation

## 2024-08-10 DIAGNOSIS — G8929 Other chronic pain: Secondary | ICD-10-CM | POA: Insufficient documentation

## 2024-08-10 DIAGNOSIS — E1165 Type 2 diabetes mellitus with hyperglycemia: Secondary | ICD-10-CM | POA: Insufficient documentation

## 2024-08-10 DIAGNOSIS — M545 Low back pain, unspecified: Secondary | ICD-10-CM | POA: Insufficient documentation

## 2024-08-10 LAB — URINALYSIS, MACROSCOPIC
BILIRUBIN: NEGATIVE mg/dL
BLOOD: NEGATIVE mg/dL
GLUCOSE: 1000 mg/dL — AB
KETONES: NEGATIVE mg/dL
LEUKOCYTES: 75 WBCs/uL — AB
NITRITE: POSITIVE — AB
PH: 5 (ref 5.0–9.0)
PROTEIN: 30 mg/dL — AB
SPECIFIC GRAVITY: 1.025 (ref 1.002–1.030)
UROBILINOGEN: NORMAL mg/dL

## 2024-08-10 LAB — BASIC METABOLIC PANEL
ANION GAP: 8 mmol/L (ref 4–13)
BUN/CREA RATIO: 15 (ref 6–22)
BUN: 13 mg/dL (ref 7–25)
CALCIUM: 10.1 mg/dL (ref 8.6–10.3)
CHLORIDE: 106 mmol/L (ref 98–107)
CO2 TOTAL: 25 mmol/L (ref 21–31)
CREATININE: 0.88 mg/dL (ref 0.60–1.30)
ESTIMATED GFR: 68 mL/min/1.73mˆ2 (ref >59)
GLUCOSE: 112 mg/dL — ABNORMAL HIGH (ref 74–109)
OSMOLALITY, CALCULATED: 278 mosm/kg (ref 270–290)
POTASSIUM: 3.9 mmol/L (ref 3.5–5.1)
SODIUM: 139 mmol/L (ref 136–145)

## 2024-08-10 LAB — URINALYSIS, MICROSCOPIC
RBCS: 6 /HPF — ABNORMAL HIGH (ref <4)
SQUAMOUS EPITHELIAL: 2 /HPF (ref <28)
WBCS: 28 /HPF — ABNORMAL HIGH (ref <6)

## 2024-08-10 LAB — HGA1C (HEMOGLOBIN A1C WITH EST AVG GLUCOSE): HEMOGLOBIN A1C: 7.1 % — ABNORMAL HIGH (ref 4.0–6.0)

## 2024-08-10 MED ORDER — DIAZEPAM 5 MG TABLET
5.0000 mg | ORAL_TABLET | Freq: Two times a day (BID) | ORAL | 0 refills | Status: DC | PRN
Start: 2024-08-10 — End: 2024-09-22

## 2024-08-10 MED ORDER — SULFAMETHOXAZOLE 800 MG-TRIMETHOPRIM 160 MG TABLET
1.0000 | ORAL_TABLET | Freq: Two times a day (BID) | ORAL | 0 refills | Status: AC
Start: 2024-08-10 — End: 2024-08-20

## 2024-08-10 MED ORDER — LATANOPROST 0.005 % EYE DROPS
1.0000 [drp] | Freq: Every evening | OPHTHALMIC | 1 refills | Status: AC
Start: 2024-08-10 — End: ?

## 2024-08-10 MED ORDER — VENLAFAXINE ER 37.5 MG CAPSULE,EXTENDED RELEASE 24 HR: 37.5000 mg | ORAL_CAPSULE | Freq: Every evening | ORAL | 1 refills | Status: DC

## 2024-08-10 MED ORDER — ERGOCALCIFEROL (VITAMIN D2) 1,250 MCG (50,000 UNIT) CAPSULE
50000.0000 [IU] | ORAL_CAPSULE | ORAL | 1 refills | Status: DC
Start: 2024-08-10 — End: 2024-11-07

## 2024-08-10 MED ORDER — CYANOCOBALAMIN (VIT B-12) 1,000 MCG/ML INJECTION SOLUTION
1000.0000 ug | INTRAMUSCULAR | Status: AC
Start: 2024-08-10 — End: 2024-08-10
  Administered 2024-08-10: 1000 ug via SUBCUTANEOUS

## 2024-08-10 NOTE — Nursing Note (Signed)
 Patient presents today as a routine 1 month follow up for medication refills.

## 2024-08-10 NOTE — Nursing Note (Signed)
 08/10/24 1200   Urine Test   Performed Status: Automated   Time collected 1203   Manufacturer Siemens   Urine Test - Siemens   Color (Ref Range: Yellow) (!) Dark Yellow   Clarity (Ref Range: Clear) (!) Cloudy   Glucose (Ref Range: Negative mg/dL) (!) 2+ (499 mg/dl)   Bilirubin (Ref Range: Negative mg/dL) (!) 1+   Ketones (Ref Range: Negative mg/dL) (!) Trace (5 mg/dl)   Urine Specific Gravity (Ref Range: 1.005 - 1.030) 1.020   Blood (urine) (Ref Range: Negative mg/dL) (!) Trace Intact   pH (Ref Range: 5.0 - 8.0) 5.5   Protein (Ref Range: Negative mg/dL) (!) Trace   Urobilinogen (Ref Range: Normal) 0.2mg /dL (Normal)   Nitrite (Ref Range: Negative) (!) Positive   Leukocytes (Ref Range: Negative WBC's/uL) Negative   Initials KD,CMA

## 2024-08-10 NOTE — Progress Notes (Signed)
 [facility]  [address]  [place] [address]          Follow Up        Reason for Visit: Follow Up (Routine 1 month ) B12 shot    This note was created with assistance from [employer] via capture of conversational audio.  Consent was obtained from the patient prior to recording.      History of Present Illness  Grace Hoffman is a 73 year old female with diabetes who presents for follow-up on her glucose management and A1c levels.    Her A1c has decreased from 11.9% to 7.6%, though it remains above the target of under 7%. Blood glucose readings are monitored through a sensor linked to her smartphone.    She is currently using Ozempic , administered in her stomach, which causes nausea for the first day after administration but improves by the second day.  She is on Jardiance  25 mg daily along with metformin  a 1000 mg b.i.d. in Toujeo  35 units nightly.  She has experienced significant weight loss, dropping from 185 pounds in May to 173 pounds currently.    She reports frequent urination without burning sensation or unusual odor.  History of recurrent UTIs noted.    Patient once again needing fructosamine level ordered by the orthopedic plans to operate on her left shoulder when sugar is controlled.     Follow-up blood pressure/hypertension w BP currently stable at 104/74.  She continues taking Vasotec  10 mg daily as well as hydrochlorothiazide  25 mg daily p.r.n.SABRA   Denies chest pain.  She does continue to have chronic dizziness for which she is seeing the neurologist and has had a MRI of her brain which noted signs of TIAs.  Pt has Occasional headaches noted and has had eval by neurologist as well in the past which was unremarkable. She has  also has been seen by the ENT where she had her crystals realigned.  She states this helped a little bit but she feels like her balance is still an issue.     Follow-up B12 deficiency as well as magnesium  deficiency with chronic fatigue issues noted.   Patient is requesting B12 injection today which do seem to help with her energy level.  Last B12 level when checked in lab stable however.       Follow-up chronic pain/DJD/fibromyalgia for which she has been on chronic pain therapy/narcotics for numerous years.  Patient had reported increased pain in her lower back with x-rays showing moderate DJD the MRI of spine ordered.  She did have an MRI and  was referred to [facility] neurosurgeon.  She also is currently taking Norco 7.5 mg q.6 hours p.r.n. which does help with her chronic pain issues.  She denies any side effects to medication and compliant w urine drug screen.  Requesting medication refill.     Follow-up anxiety/depression with patient currently on Valium  5 mg daily p.r.n. as well as Effexor  37.5 mg daily.  Patient denies panic attack and states anxiety depression overall stable at this current time.    PHQ Questionnaire               Past Medical History:   Diagnosis Date    Anxiety     Chronic back pain     Chronic pain 04/03/2022    COVID-19 vaccine series completed     Depression     Diabetes mellitus, type 2     Fibromyalgia affecting multiple sites  Hypertension     Hypomagnesemia     Insomnia disorder     Migraine     Mixed hyperlipidemia     Orthostatic hypotension     Osteoarthritis     TIA (transient ischemic attack)     Unspecified glaucoma(365.9)     Vitamin D  deficiency          Past Surgical History:   Procedure Laterality Date    HX CHOLECYSTECTOMY      HX KNEE REPLACMENT Left     HX ROTATOR CUFF REPAIR Left     SKIN CANCER EXCISION      forehead removed      Family Medical History:       Problem Relation (Age of Onset)    Kidney failure Mother    No Known Problems Sister, Brother, Half-Sister, Half-Brother, Maternal Aunt, Maternal Uncle, Paternal Aunt, Paternal Uncle, Maternal Grandmother, Maternal Grandfather, Paternal Grandmother, Paternal Grandfather, Daughter, Son    Respiratory Problems Father            Social History     Tobacco Use     Smoking status: Never    Smokeless tobacco: Never   Vaping Use    Vaping status: Never Used   Substance Use Topics    Alcohol use: Never    Drug use: Never     Medication:  Blood-Glucose Meter,Continuous (FREESTYLE LIBRE 3 READER) Does not apply Misc, 1 Each Every 6 hours as needed  d-mannose 500 mg Oral Capsule, Take 1 Capsule (500 mg total) by mouth Twice daily  empagliflozin  (JARDIANCE ) 25 mg Oral Tablet, Take 1 Tablet (25 mg total) by mouth Once a day  insulin  glargine U-300 conc (TOUJEO  MAX U-300 SOLOSTAR) 300 unit/mL (3 mL) Subcutaneous Insulin  Pen, Inject 35 Units under the skin Every night  insulin  lispro (HUMALOG  KWIKPEN INSULIN ) 100 unit/mL Subcutaneous Insulin  Pen, Use as directed sliding scale; also use 5 units with meals Indications: type 2 diabetes mellitus  Levetiracetam  500 mg Oral Tablet Sustained Release 24 hr, Take 1 Tablet (500 mg total) by mouth Every night  magnesium  oxide (MAG-OX) 400 mg Oral Tablet, Take 1 Tablet (400 mg total) by mouth Twice daily  rosuvastatin  (CRESTOR ) 20 mg Oral Tablet, Take 1 Tablet (20 mg total) by mouth Every evening  semaglutide  (OZEMPIC ) 1 mg/dose (4 mg/3 mL) Subcutaneous Pen Injector, Inject 1 mg under the skin Every 7 days  solifenacin  (VESICARE ) 5 mg Oral Tablet, Take 1 Tablet (5 mg total) by mouth Once a day  SUMAtriptan  (IMITREX ) 50 mg Oral Tablet, Take 1 Tablet (50 mg total) by mouth Once, as needed for Migraine TAKE 1 TABLET BY MOUTH AT ONSET OF HEADACHE. IF NO RELIEF MAY REPEAT 1 AFTER AT LEAST 2 HOURS. MAX OF 4 TABS PER 24 HOURS  Blood-Glucose Sensor (FREESTYLE LIBRE 3 SENSOR) Does not apply Device, 1 Device Every 14 days  diazePAM  (VALIUM ) 5 mg Oral Tablet, Take 1 Tablet (5 mg total) by mouth Every 12 hours as needed for Anxiety  enalapril  maleate (VASOTEC ) 10 mg Oral Tablet, Take 1 Tablet (10 mg total) by mouth Daily  ergocalciferol , vitamin D2, (DRISDOL ) 1,250 mcg (50,000 unit) Oral Capsule, Take 1 Capsule (50,000 Units total) by mouth Every 7  days  HYDROcodone -acetaminophen  (NORCO) 7.5-325 mg Oral Tablet, Take 1 Tablet by mouth Every 8 hours as needed for up to 30 days Indications: pain  latanoprost  (XALATAN ) 0.005 % Ophthalmic Drops, Administer 1 Drop into the left eye Every night  MetFORMIN  (GLUCOPHAGE ) 1,000 mg Oral  Tablet, Take 1 Tablet (1,000 mg total) by mouth Twice daily with food  ondansetron  (ZOFRAN  ODT) 4 mg Oral Tablet, Rapid Dissolve, Take 1 Tablet (4 mg total) by mouth Every 8 hours as needed for Nausea/Vomiting  venlafaxine  (EFFEXOR  XR) 37.5 mg Oral Capsule, Sust. Release 24 hr, Take 1 Capsule (37.5 mg total) by mouth Every night    No facility-administered medications prior to visit.    Allergies:  Allergies   Allergen Reactions    Codeine Nausea/ Vomiting    Amoxicillin Diarrhea    Macrobid  [Nitrofurantoin  Monohyd/M-Cryst] Nausea/ Vomiting       Physical Exam:  Vitals:    08/10/24 1133   BP: 104/74   Pulse: 85   SpO2: 99%   Weight: 78.5 kg (173 lb)   Height: 1.651 m (5' 5)   BMI: 28.79        Physical Exam  Vitals and nursing note reviewed.   Constitutional:       Appearance: Normal appearance. She is obese.   HENT:      Right Ear: Tympanic membrane normal.      Left Ear: Tympanic membrane normal.      Mouth/Throat:      Mouth: Mucous membranes are moist.      Pharynx: Oropharynx is clear.   Eyes:      Extraocular Movements: Extraocular movements intact.      Pupils: Pupils are equal, round, and reactive to light.   Cardiovascular:      Rate and Rhythm: Normal rate and regular rhythm.      Pulses: Normal pulses.   Pulmonary:      Effort: Pulmonary effort is normal.      Breath sounds: Normal breath sounds.   Abdominal:      General: Bowel sounds are normal.      Palpations: Abdomen is soft.      Tenderness:+ suprapubic tenderness on palpation There is no right CVA tenderness, left CVA tenderness or rebound.   Musculoskeletal:         General: No swelling or tenderness. Normal range of motion.      Cervical back: Normal range of motion  and neck supple.      Comments: Arthritic changes hands and knees noted bilateral  Chronic trigger points upper back and thigh area  Chronic low back pain with range of motion  Negative straight leg raises  Gait stable   Skin:     General: Skin is warm.      Findings: No lesion or rash.   Neurological:      General: No focal deficit present.      Mental Status: She is alert and oriented to person, place, and time.      Sensory: No sensory deficit.      Motor: Weakness (Strength of upper and lower extremities decreased however equal bilateral) present.      Gait: Gait normal.   Psychiatric:         Mood and Affect: Mood normal.         Behavior: Behavior normal.         Thought Content: Thought content normal.         Judgment: Judgment normal.     Urine Dip Results:   Time collected: 1203  Glucose (Ref Range: Negative mg/dL): (!) 2+ (499 mg/dl)  Bilirubin (Ref Range: Negative mg/dL): (!) Small  Ketones (Ref Range: Negative mg/dL): (!) Trace (5 mg/dl)  Urine Specific Gravity (Ref Range: 1.005 - 1.030): 1.020  Blood (urine) (Ref Range: Negative mg/dL): (!) Trace Intact  pH (Ref Range: 5.0 - 8.0): 5.5  Protein (Ref Range: Negative mg/dL): (!) Trace  Urobilinogen (Ref Range: Normal): 0.2mg /dL (Normal)  Nitrite (Ref Range: Negative): (!) Positive  Leukocytes (Ref Range: Negative WBC's/uL): Negative       Assessment & Plan  Recurrent UTI  Urine dip noting signs of infection along with blood.  -order urinalysis microscopic with culture  -prescribe Bactrim  DS b.i.d. times 10 days  Uncontrolled type 2 diabetes mellitus with hyperglycemia (CMS HCC)  A1c improvement noted with requests to also obtain fructosamine level per orthopedic request.  -glucose A1c fructosamine obtained in blood work  - Continue current meds along w diet BS monitoring  Essential hypertension  BP stable on current meds.  -Continue medications along with blood pressure monitoring  B12 deficiency  Chronic fatigue issues with B12 deficiency noted.   Received B12 injections monthly.    -administer B12 IM  Chronic low back pain, unspecified back pain laterality, unspecified whether sciatica present  Chronic back pain/fibromyalgia for which patient is On chronic pain management.  -Continue Norco 7.5 mg q.8 hours-refills given  Mood disorder (CMS HCC)  Anxiety depression controlled with Effexor  as well as Valium .  -continue current meds along with follow-up     Orders Placed This Encounter    URINE CULTURE,ROUTINE    HGA1C (HEMOGLOBIN A1C WITH EST AVG GLUCOSE)    FRUCTOSAMINE, SERUM    BASIC METABOLIC PANEL    URINALYSIS, MACROSCOPIC AND MICROSCOPIC W/CULTURE REFLEX    URINALYSIS, MACROSCOPIC    URINALYSIS, MICROSCOPIC    POCT URINE DIPSTICK    cyanocobalamin  (VITAMIN B12) 1000 mcg/mL injection    latanoprost  (XALATAN ) 0.005 % Ophthalmic Drops    venlafaxine  (EFFEXOR  XR) 37.5 mg Oral Capsule, Sust. Release 24 hr    ergocalciferol , vitamin D2, (DRISDOL ) 1,250 mcg (50,000 unit) Oral Capsule    trimethoprim -sulfamethoxazole  (BACTRIM  DS) 160-800mg  per tablet       Post-Discharge Follow Up Appointments       Monday Nov 07, 2024    Return Patient Visit with Scharlene No, PA-C at 11:00 AM      Wednesday Dec 07, 2024    Return Patient Visit with Scharlene No, PA-C at 10:00 AM      Thursday May 25, 2025    Return Telephone Visit with Scharlene No, PA-C at  1:30 PM      [facility], [facility]  [facility]  [address]  [place] [address]  [phone]           Seek medical attention for new or worsening symptoms.  Patient has been seen in this clinic within the last 3 years.       I personally reviewed the documentation of care provided by the certified physician assistant and I agree with her medical decision making, Jhonny Romano, D.O.     Amy Alvis, PA-C          This note was partially created using MModal Fluency Direct system (voice recognition software) and is inherently subject to errors including those of syntax and sound-alike  substitutions which may escape proofreading.  In such instances, original meaning may be extrapolated by contextual derivation.

## 2024-08-10 NOTE — Nursing Note (Signed)
 Routine 1 month follow up. Med refills.

## 2024-08-11 MED ORDER — HYDROCODONE 7.5 MG-ACETAMINOPHEN 325 MG TABLET
1.0000 | ORAL_TABLET | Freq: Three times a day (TID) | ORAL | 0 refills | Status: DC | PRN
Start: 2024-08-11 — End: 2024-09-14

## 2024-08-12 ENCOUNTER — Ambulatory Visit (INDEPENDENT_AMBULATORY_CARE_PROVIDER_SITE_OTHER): Payer: Self-pay | Admitting: Physician Assistant

## 2024-08-12 ENCOUNTER — Ambulatory Visit (INDEPENDENT_AMBULATORY_CARE_PROVIDER_SITE_OTHER): Payer: Self-pay

## 2024-08-12 LAB — URINE CULTURE,ROUTINE: URINE CULTURE: >100000 — AB

## 2024-08-14 LAB — FRUCTOSAMINE, SERUM: FRUCTOSAMINE: 284 umol/L (ref 205–285)

## 2024-08-23 DIAGNOSIS — M797 Fibromyalgia: Secondary | ICD-10-CM | POA: Insufficient documentation

## 2024-08-23 NOTE — Assessment & Plan Note (Signed)
 On chronic pain management  Continue Norco 7.5 mg q.8 hours-refills given

## 2024-08-23 NOTE — Assessment & Plan Note (Signed)
-  BP stable on current meds  -Continue blood pressure monitoring

## 2024-08-23 NOTE — Assessment & Plan Note (Signed)
-  renal function stable  -continue following with nephrologist

## 2024-08-23 NOTE — Assessment & Plan Note (Signed)
 A1c at 7.6 and fructosamine at 321 indicate suboptimal control. Ozempic  aids in weight loss and glycemic control despite causing nausea and vomiting.  - Continue Ozempic  Jardiance  metformin  and Toujeo  as directed  - Confirm with Dr. Norleen Ellen regarding lab results and surgery scheduling.  - Provide lab results copy to her.

## 2024-08-23 NOTE — Assessment & Plan Note (Signed)
 B12 injection administered

## 2024-08-23 NOTE — Assessment & Plan Note (Signed)
-  controlled on benzo therapy  -continue valium  5mg  bid- refills given

## 2024-09-07 ENCOUNTER — Ambulatory Visit (INDEPENDENT_AMBULATORY_CARE_PROVIDER_SITE_OTHER): Payer: Self-pay | Admitting: Physician Assistant

## 2024-09-12 ENCOUNTER — Ambulatory Visit (INDEPENDENT_AMBULATORY_CARE_PROVIDER_SITE_OTHER): Payer: Self-pay | Admitting: Physician Assistant

## 2024-09-14 ENCOUNTER — Other Ambulatory Visit: Payer: Self-pay

## 2024-09-14 ENCOUNTER — Encounter (INDEPENDENT_AMBULATORY_CARE_PROVIDER_SITE_OTHER): Payer: Self-pay | Admitting: Physician Assistant

## 2024-09-14 ENCOUNTER — Ambulatory Visit: Payer: Self-pay | Attending: Physician Assistant | Admitting: Physician Assistant

## 2024-09-14 ENCOUNTER — Other Ambulatory Visit (INDEPENDENT_AMBULATORY_CARE_PROVIDER_SITE_OTHER): Payer: Self-pay | Admitting: Physician Assistant

## 2024-09-14 VITALS — BP 112/68 | HR 65 | Wt 173.0 lb

## 2024-09-14 DIAGNOSIS — G8929 Other chronic pain: Secondary | ICD-10-CM | POA: Insufficient documentation

## 2024-09-14 DIAGNOSIS — S43432D Superior glenoid labrum lesion of left shoulder, subsequent encounter: Secondary | ICD-10-CM

## 2024-09-14 DIAGNOSIS — E1165 Type 2 diabetes mellitus with hyperglycemia: Secondary | ICD-10-CM | POA: Insufficient documentation

## 2024-09-14 DIAGNOSIS — N390 Urinary tract infection, site not specified: Secondary | ICD-10-CM | POA: Insufficient documentation

## 2024-09-14 DIAGNOSIS — Z7985 Long-term (current) use of injectable non-insulin antidiabetic drugs: Secondary | ICD-10-CM

## 2024-09-14 DIAGNOSIS — E538 Deficiency of other specified B group vitamins: Secondary | ICD-10-CM | POA: Insufficient documentation

## 2024-09-14 DIAGNOSIS — I10 Essential (primary) hypertension: Secondary | ICD-10-CM | POA: Insufficient documentation

## 2024-09-14 DIAGNOSIS — Z7984 Long term (current) use of oral hypoglycemic drugs: Secondary | ICD-10-CM

## 2024-09-14 DIAGNOSIS — Z794 Long term (current) use of insulin: Secondary | ICD-10-CM

## 2024-09-14 DIAGNOSIS — M545 Low back pain, unspecified: Secondary | ICD-10-CM | POA: Insufficient documentation

## 2024-09-14 DIAGNOSIS — M797 Fibromyalgia: Secondary | ICD-10-CM

## 2024-09-14 LAB — URINALYSIS, MACROSCOPIC
BILIRUBIN: NEGATIVE mg/dL
BLOOD: NEGATIVE mg/dL
GLUCOSE: >1000 mg/dL — AB
KETONES: NEGATIVE mg/dL
LEUKOCYTES: 25 WBCs/uL — AB
NITRITE: NEGATIVE
PH: 6 (ref 5.0–9.0)
PROTEIN: NEGATIVE mg/dL
SPECIFIC GRAVITY: 1.032 — ABNORMAL HIGH (ref 1.002–1.030)
UROBILINOGEN: NORMAL mg/dL

## 2024-09-14 LAB — URINALYSIS, MICROSCOPIC
RBCS: 1 /HPF (ref <4)
SQUAMOUS EPITHELIAL: <1 /HPF (ref <28)
TRANSITIONAL EPITHELIAL CELLS URINE: <1 /HPF (ref <6)
WBCS: 6 /HPF — ABNORMAL HIGH (ref <6)

## 2024-09-14 MED ORDER — CYANOCOBALAMIN (VIT B-12) 1,000 MCG/ML INJECTION SOLUTION
1000.0000 ug | INTRAMUSCULAR | Status: AC
Start: 2024-09-14 — End: 2024-09-14
  Administered 2024-09-14: 1000 ug via SUBCUTANEOUS

## 2024-09-14 NOTE — Assessment & Plan Note (Addendum)
 B12 deficiency with chronic fatigue issues noted.  -B12 administered IM

## 2024-09-14 NOTE — Assessment & Plan Note (Addendum)
 Uncontrolled diabetes with improvement in A1c level since initiation of Ozempic .  --Continue current meds along w diet BS monitoring

## 2024-09-14 NOTE — Telephone Encounter (Signed)
 Pattent presents for a 47mo follow up  and medication refills

## 2024-09-14 NOTE — Progress Notes (Signed)
 [facility]  [address]  [place] [address]          Follow Up        Reason for Visit: Follow Up B12 shot    This note was created with assistance from [employer] via capture of conversational audio.  Consent was obtained from the patient prior to recording.      History of Present Illness    Grace Hoffman is a 73 year old female with diabetes who presents for medication management and evaluation of possible urinary tract infection.    She has a history of urinary tract infections (UTIs) and  is concerned about a possible current UTI due to feeling well.  She does have some urinary pressure with frequency noted but denies fever chills.    Blood pressure is stable at 1 12/68   Blood glucose readings are monitored through a sensor linked to her smartphone and noting her A1c a well 7.1.  Last fructosamine level was 284. Currently patient is on metformin  at 1000 mg twice daily along  Toujeo  35 units nightly ozempic  1 mg weekly jardiance  25 mg daily and Humalog  5 units w meals.     She experiences chronic back pain with a history of bulging disc as well as significant left arm pain with noted Librium tear.  History of fibromyalgia also noted. She is awaiting surgery currently on her left shoulder and after recent MRI results of the lumbar spine was referred to a neurosurgeon in [place].  She is on chronic pain management current medications include Norco 7.5-325 mg PRN.    She also receives B12 injections for B12 deficiency as well as chronic fatigue.  Requesting injection today.      PHQ Questionnaire         Past Medical History:   Diagnosis Date    Anxiety     Chronic back pain     Chronic pain 04/03/2022    COVID-19 vaccine series completed     Depression     Diabetes mellitus, type 2     Fibromyalgia affecting multiple sites     Hypertension     Hypomagnesemia     Insomnia disorder     Migraine     Mixed hyperlipidemia     Orthostatic hypotension     Osteoarthritis     TIA  (transient ischemic attack)     Unspecified glaucoma(365.9)     Vitamin D  deficiency          Past Surgical History:   Procedure Laterality Date    HX CHOLECYSTECTOMY      HX KNEE REPLACMENT Left     HX ROTATOR CUFF REPAIR Left     SKIN CANCER EXCISION      forehead removed      Family Medical History:       Problem Relation (Age of Onset)    Kidney failure Mother    No Known Problems Sister, Brother, Half-Sister, Half-Brother, Maternal Aunt, Maternal Uncle, Paternal Aunt, Paternal Uncle, Maternal Grandmother, Maternal Grandfather, Paternal Grandmother, Paternal Grandfather, Daughter, Son    Respiratory Problems Father            Social History     Tobacco Use    Smoking status: Never    Smokeless tobacco: Never   Vaping Use    Vaping status: Never Used   Substance Use Topics    Alcohol use: Never    Drug use: Never     Medication:  Blood-Glucose Meter,Continuous (FREESTYLE LIBRE 3 READER) Does not apply Misc, 1 Each Every 6 hours as needed  Blood-Glucose Sensor (FREESTYLE LIBRE 3 SENSOR) Does not apply Device, 1 Device Every 14 days  d-mannose 500 mg Oral Capsule, Take 1 Capsule (500 mg total) by mouth Twice daily  diazePAM  (VALIUM ) 5 mg Oral Tablet, Take 1 Tablet (5 mg total) by mouth Every 12 hours as needed for Anxiety  empagliflozin  (JARDIANCE ) 25 mg Oral Tablet, Take 1 Tablet (25 mg total) by mouth Once a day  enalapril  maleate (VASOTEC ) 10 mg Oral Tablet, Take 1 Tablet (10 mg total) by mouth Daily  ergocalciferol , vitamin D2, (DRISDOL ) 1,250 mcg (50,000 unit) Oral Capsule, Take 1 Capsule (50,000 Units total) by mouth Every 7 days  insulin  glargine U-300 conc (TOUJEO  MAX U-300 SOLOSTAR) 300 unit/mL (3 mL) Subcutaneous Insulin  Pen, Inject 35 Units under the skin Every night  insulin  lispro (HUMALOG  KWIKPEN INSULIN ) 100 unit/mL Subcutaneous Insulin  Pen, Use as directed sliding scale; also use 5 units with meals Indications: type 2 diabetes mellitus  latanoprost  (XALATAN ) 0.005 % Ophthalmic Drops, Administer 1  Drop into the left eye Every night  Levetiracetam  500 mg Oral Tablet Sustained Release 24 hr, Take 1 Tablet (500 mg total) by mouth Every night  magnesium  oxide (MAG-OX) 400 mg Oral Tablet, Take 1 Tablet (400 mg total) by mouth Twice daily  MetFORMIN  (GLUCOPHAGE ) 1,000 mg Oral Tablet, Take 1 Tablet (1,000 mg total) by mouth Twice daily with food  ondansetron  (ZOFRAN  ODT) 4 mg Oral Tablet, Rapid Dissolve, Take 1 Tablet (4 mg total) by mouth Every 8 hours as needed for Nausea/Vomiting  rosuvastatin  (CRESTOR ) 20 mg Oral Tablet, Take 1 Tablet (20 mg total) by mouth Every evening  semaglutide  (OZEMPIC ) 1 mg/dose (4 mg/3 mL) Subcutaneous Pen Injector, Inject 1 mg under the skin Every 7 days  solifenacin  (VESICARE ) 5 mg Oral Tablet, Take 1 Tablet (5 mg total) by mouth Once a day  SUMAtriptan  (IMITREX ) 50 mg Oral Tablet, Take 1 Tablet (50 mg total) by mouth Once, as needed for Migraine TAKE 1 TABLET BY MOUTH AT ONSET OF HEADACHE. IF NO RELIEF MAY REPEAT 1 AFTER AT LEAST 2 HOURS. MAX OF 4 TABS PER 24 HOURS  venlafaxine  (EFFEXOR  XR) 37.5 mg Oral Capsule, Sust. Release 24 hr, Take 1 Capsule (37.5 mg total) by mouth Every night    No facility-administered medications prior to visit.    Allergies:  Allergies   Allergen Reactions    Codeine Nausea/ Vomiting    Amoxicillin Diarrhea    Macrobid  [Nitrofurantoin  Monohyd/M-Cryst] Nausea/ Vomiting       Physical Exam:  Vitals:    09/14/24 1136   BP: 112/68   Pulse: 65   SpO2: 100%   Weight: 78.5 kg (173 lb)        Physical Exam  Vitals and nursing note reviewed.   Constitutional:       Appearance: Normal appearance. She is obese.   HENT:      Right Ear: Tympanic membrane normal.      Left Ear: Tympanic membrane normal.      Mouth/Throat:      Mouth: Mucous membranes are moist.      Pharynx: Oropharynx is clear.   Eyes:      Extraocular Movements: Extraocular movements intact.      Pupils: Pupils are equal, round, and reactive to light.   Cardiovascular:      Rate and Rhythm: Normal  rate and regular rhythm.  Pulses: Normal pulses.   Pulmonary:      Effort: Pulmonary effort is normal.      Breath sounds: Normal breath sounds.   Abdominal:      General: Bowel sounds are normal.      Palpations: Abdomen is soft.      Tenderness: There is no abdominal tenderness. There is no right CVA tenderness, left CVA tenderness or rebound.   Musculoskeletal:         General: No swelling or tenderness. Normal range of motion.      Comments: Arthritic changes hands and knees noted bilateral  Chronic trigger points upper back and thigh area  Chronic low back pain with range of motion  Negative straight leg raises  Gait stable   +tenderness on palp left deltoid w weakness of left arm and ROM limited  Skin:     General: Skin is warm.      Findings: No lesion or rash.   Neurological:      General: No focal deficit present.      Mental Status: She is alert and oriented to person, place, and time.      Sensory: No sensory deficit.      Motor: generalized weakness lower extrem present.      Gait: Gait normal.   Psychiatric:         Mood and Affect: Mood normal.         Behavior: Behavior normal.         Thought Content: Thought content normal.         Judgment: Judgment normal.       Assessment & Plan  Recurrent UTI  Urinary symptoms suggestive of recurrent urinary tract infection noted.  -urinalysis obtained for culture  -may use over-the-counter azo with increase fluid intake  -will await urine results for further treatment  Essential hypertension  BP stable without patient currently on blood pressure medication.  - Continue blood pressure monitoring  Uncontrolled type 2 diabetes mellitus with hyperglycemia (CMS HCC)  Uncontrolled diabetes with improvement in A1c level since initiation of Ozempic .  --Continue current meds along w diet BS monitoring  Chronic low back pain, unspecified back pain laterality, unspecified whether sciatica present  Chronic back pain/fibromyalgia as well as left shoulder pain with noted  labral tear.  Patient is awaiting surgery with orthopedic at this time concerning her left shoulder.  Referral has been made to the neurosurgeon in [place]  concerning patient's back pain.  Chronic pain management noted.  -continue Norco 7.5 mg q.8 hours PRN  B12 deficiency  B12 deficiency with chronic fatigue issues noted.  -B12 administered IM    Orders Placed This Encounter    URINALYSIS, MACROSCOPIC AND MICROSCOPIC W/CULTURE REFLEX    cyanocobalamin  (VITAMIN B12) 1000 mcg/mL injection         Post-Discharge Follow Up Appointments       Monday Oct 10, 2024    Return Patient Visit with Scharlene No, PA-C at 11:30 AM      Monday Nov 07, 2024    Return Patient Visit with Scharlene No, PA-C at 11:00 AM      Wednesday Dec 07, 2024    Return Patient Visit with Scharlene No, PA-C at 10:00 AM      Thursday May 25, 2025    Return Telephone Visit with Scharlene No, PA-C at  1:30 PM      [facility] Medicine, [facility]  [facility] [place]  [address]  [place] [address]  [phone]              Seek medical attention for new or worsening symptoms.  Patient has been seen in this clinic within the last 3 years.     Amy Scharlene, PA-C        I personally reviewed the documentation of care provided by the certified physician assistant and I agree with her medical decision making, Jhonny Romano, D.O.     This note was partially created using MModal Fluency Direct system (voice recognition software) and is inherently subject to errors including those of syntax and sound-alike substitutions which may escape proofreading.  In such instances, original meaning may be extrapolated by contextual derivation.

## 2024-09-14 NOTE — Assessment & Plan Note (Addendum)
 BP stable without patient currently on blood pressure medication.  - Continue blood pressure monitoring

## 2024-09-14 NOTE — Assessment & Plan Note (Addendum)
 Urinary symptoms suggestive of recurrent urinary tract infection noted.  -urinalysis obtained for culture  -may use over-the-counter azo with increase fluid intake  -will await urine results for further treatment

## 2024-09-15 MED ORDER — HYDROCODONE 7.5 MG-ACETAMINOPHEN 325 MG TABLET
1.0000 | ORAL_TABLET | Freq: Three times a day (TID) | ORAL | 0 refills | Status: DC | PRN
Start: 2024-09-15 — End: 2024-10-10

## 2024-09-16 ENCOUNTER — Ambulatory Visit (INDEPENDENT_AMBULATORY_CARE_PROVIDER_SITE_OTHER): Payer: Self-pay

## 2024-09-16 LAB — URINE CULTURE,ROUTINE: URINE CULTURE: >100000 — AB

## 2024-09-21 ENCOUNTER — Other Ambulatory Visit (INDEPENDENT_AMBULATORY_CARE_PROVIDER_SITE_OTHER): Payer: Self-pay

## 2024-09-21 ENCOUNTER — Other Ambulatory Visit: Payer: Self-pay

## 2024-09-21 ENCOUNTER — Ambulatory Visit: Attending: Physician Assistant

## 2024-09-21 ENCOUNTER — Other Ambulatory Visit (INDEPENDENT_AMBULATORY_CARE_PROVIDER_SITE_OTHER): Payer: Self-pay | Admitting: Physician Assistant

## 2024-09-21 ENCOUNTER — Other Ambulatory Visit (HOSPITAL_COMMUNITY): Admitting: Physician Assistant

## 2024-09-21 DIAGNOSIS — N390 Urinary tract infection, site not specified: Secondary | ICD-10-CM

## 2024-09-21 NOTE — Nursing Note (Signed)
 Patient presents today to give another urinary sample, due to last one being contaminated.

## 2024-09-21 NOTE — Nursing Note (Signed)
 09/21/24 1500   Urine Test   Performed Status: Automated   Time collected 1552   Manufacturer Siemens   Urine Test - Siemens   Color (Ref Range: Yellow) Yellow   Clarity (Ref Range: Clear) Clear   Glucose (Ref Range: Negative mg/dL) (!) 2+ (499 mg/dl)   Bilirubin (Ref Range: Negative mg/dL) Negative   Ketones (Ref Range: Negative mg/dL) Negative   Urine Specific Gravity (Ref Range: 1.005 - 1.030) 1.010   Blood (urine) (Ref Range: Negative mg/dL) Negative   pH (Ref Range: 5.0 - 8.0) 5.5   Protein (Ref Range: Negative mg/dL) Negative   Urobilinogen (Ref Range: Normal) 0.2mg /dL (Normal)   Nitrite (Ref Range: Negative) Negative   Leukocytes (Ref Range: Negative WBC's/uL) Negative

## 2024-09-22 ENCOUNTER — Ambulatory Visit (INDEPENDENT_AMBULATORY_CARE_PROVIDER_SITE_OTHER): Payer: Self-pay

## 2024-09-22 ENCOUNTER — Other Ambulatory Visit (INDEPENDENT_AMBULATORY_CARE_PROVIDER_SITE_OTHER): Payer: Self-pay | Admitting: Physician Assistant

## 2024-09-22 ENCOUNTER — Telehealth (INDEPENDENT_AMBULATORY_CARE_PROVIDER_SITE_OTHER): Payer: Self-pay | Admitting: Physician Assistant

## 2024-09-22 LAB — URINALYSIS, MACROSCOPIC
BILIRUBIN: NEGATIVE mg/dL
BLOOD: NEGATIVE mg/dL
GLUCOSE: >1000 mg/dL — AB
KETONES: NEGATIVE mg/dL
LEUKOCYTES: 25 WBCs/uL — AB
NITRITE: NEGATIVE
PH: 5.5 (ref 5.0–9.0)
PROTEIN: NEGATIVE mg/dL
SPECIFIC GRAVITY: 1.036 — ABNORMAL HIGH (ref 1.002–1.030)
UROBILINOGEN: NORMAL mg/dL

## 2024-09-22 LAB — URINALYSIS, MICROSCOPIC
RBCS: 3 /HPF (ref <4)
SQUAMOUS EPITHELIAL: 2 /HPF (ref <28)
WBCS: 7 /HPF — ABNORMAL HIGH (ref <6)

## 2024-09-22 MED ORDER — DIAZEPAM 5 MG TABLET
5.0000 mg | ORAL_TABLET | Freq: Two times a day (BID) | ORAL | 0 refills | Status: DC | PRN
Start: 2024-09-22 — End: 2024-10-10

## 2024-09-22 MED ORDER — CLINDAMYCIN HCL 150 MG CAPSULE
150.0000 mg | ORAL_CAPSULE | Freq: Four times a day (QID) | ORAL | 0 refills | Status: AC
Start: 2024-09-22 — End: ?

## 2024-09-22 NOTE — Telephone Encounter (Signed)
 Patient contacted office and stated that she had not received a refill on her Diazepam  or the antibiotic for her abscess tooth she asked about yesterday.

## 2024-09-24 LAB — URINE CULTURE,ROUTINE: URINE CULTURE: 50000 — AB

## 2024-09-28 ENCOUNTER — Ambulatory Visit: Payer: Self-pay | Attending: Physician Assistant | Admitting: Physician Assistant

## 2024-09-28 ENCOUNTER — Encounter (INDEPENDENT_AMBULATORY_CARE_PROVIDER_SITE_OTHER): Payer: Self-pay | Admitting: Physician Assistant

## 2024-09-28 ENCOUNTER — Other Ambulatory Visit: Payer: Self-pay

## 2024-09-28 ENCOUNTER — Other Ambulatory Visit (INDEPENDENT_AMBULATORY_CARE_PROVIDER_SITE_OTHER): Payer: Self-pay | Admitting: Physician Assistant

## 2024-09-28 VITALS — BP 112/76 | HR 60 | Wt 174.0 lb

## 2024-09-28 DIAGNOSIS — Z01818 Encounter for other preprocedural examination: Secondary | ICD-10-CM | POA: Insufficient documentation

## 2024-09-28 DIAGNOSIS — Z7985 Long-term (current) use of injectable non-insulin antidiabetic drugs: Secondary | ICD-10-CM

## 2024-09-28 DIAGNOSIS — E119 Type 2 diabetes mellitus without complications: Secondary | ICD-10-CM | POA: Insufficient documentation

## 2024-09-28 DIAGNOSIS — N182 Chronic kidney disease, stage 2 (mild): Secondary | ICD-10-CM | POA: Insufficient documentation

## 2024-09-28 DIAGNOSIS — Z7984 Long term (current) use of oral hypoglycemic drugs: Secondary | ICD-10-CM

## 2024-09-28 DIAGNOSIS — Z794 Long term (current) use of insulin: Secondary | ICD-10-CM | POA: Insufficient documentation

## 2024-09-28 DIAGNOSIS — N390 Urinary tract infection, site not specified: Secondary | ICD-10-CM | POA: Insufficient documentation

## 2024-09-28 DIAGNOSIS — I129 Hypertensive chronic kidney disease with stage 1 through stage 4 chronic kidney disease, or unspecified chronic kidney disease: Secondary | ICD-10-CM

## 2024-09-28 DIAGNOSIS — I10 Essential (primary) hypertension: Secondary | ICD-10-CM | POA: Insufficient documentation

## 2024-09-28 DIAGNOSIS — E1122 Type 2 diabetes mellitus with diabetic chronic kidney disease: Secondary | ICD-10-CM

## 2024-09-28 LAB — URINALYSIS, MICROSCOPIC
RBCS: 1 /HPF (ref <4)
SQUAMOUS EPITHELIAL: <1 /HPF (ref <28)
WBCS: 5 /HPF (ref <6)

## 2024-09-28 LAB — URINALYSIS, MACROSCOPIC
BILIRUBIN: NEGATIVE mg/dL
BLOOD: NEGATIVE mg/dL
GLUCOSE: >1000 mg/dL — AB
KETONES: NEGATIVE mg/dL
LEUKOCYTES: NEGATIVE WBCs/uL
NITRITE: NEGATIVE
PH: 5.5 (ref 5.0–9.0)
PROTEIN: NEGATIVE mg/dL
SPECIFIC GRAVITY: 1.028 (ref 1.002–1.030)
UROBILINOGEN: NORMAL mg/dL

## 2024-09-28 MED ORDER — FREESTYLE LIBRE 3 SENSOR DEVICE: 1.0000 | 5 refills | Status: DC

## 2024-09-28 NOTE — Assessment & Plan Note (Signed)
 History of recurrent UTIs with urinalysis obtained today.  No current urinary symptoms noted

## 2024-09-28 NOTE — Nursing Note (Signed)
 Patient presents for a pre op eval.

## 2024-09-28 NOTE — Assessment & Plan Note (Signed)
 Essential hypertension managed with enalapril  10 mg daily antihypertensive medication.  Blood pressure stable today at 112/76.  - Take blood pressure medication with a small sip of water  on the morning of surgery.

## 2024-09-28 NOTE — Progress Notes (Signed)
 [facility]  [address]  [place] [address]  Operated by [facility]  History and Physical    Name: Grace Hoffman MRN:  [id number]   Date: 09/28/2024 DOB:  03-28-1951 (73 y.o.)       PCP: Greig Seats, PA-C     Reason for Visit: Pre-op    This note was created with assistance from [employer] via capture of conversational audio.  Consent was obtained from the patient prior to recording.      History of Present Illness  ATLAS KUC is a 73 year old female who presents for pre-operative evaluation for reverse left shoulder replacement surgery.    She is awaiting a reverse shoulder replacement surgery by Dr. Thom Gaskins but has not yet received a confirmed date for the procedure. She has previously undergone rotator cuff surgery without complications from anesthesia.    She is currently taking Ozempic  and insulin  for blood sugar management. Her A1c level is 7.1. She uses a sensor for monitoring her blood sugar levels.     No chest pain or acute symptoms of a urinary infection noted.     This surgery is considered to be Low/Med/High: Low risk for cardiac events.  Functional capacity is estimated at unknown    - Patient denies any of the following Major Clinical Predictors:  Unstable coronary artery disease ( Recent MI, unstable angina),  Decompensated heart failure, Significant arrythmias, or Severe valvular disease.   - Patient denies any of the following Intermediate Clinical Predictors:  Mild angina pectoris, Prior MI, Compensated heart failure,   + hx of Diabetes Mellitus A1c 7.1  + renal insufficiency/chronic kidney disease mild  - Patient denies the following Minor Clinical Predictors:  Abormal ECG, History of rhythm other than sinus, Low functional capacity, History of stroke, or Uncontrolled systemic hypertension.      She does not currently smoke.   She does not consume alcohol.   She does not use recreational drugs.   She does  use caffeine. Amount:  2 cups of  coffee q am    She has had anesthesia in the past and has not had an anesthesia reaction.       Past Medical History:   Diagnosis Date    Anxiety     Chronic back pain     Chronic pain 04/03/2022    COVID-19 vaccine series completed     Depression     Diabetes mellitus, type 2     Fibromyalgia affecting multiple sites     Hypertension     Hypomagnesemia     Insomnia disorder     Migraine     Mixed hyperlipidemia     Orthostatic hypotension     Osteoarthritis     TIA (transient ischemic attack)     Unspecified glaucoma(365.9)     Vitamin D  deficiency          Past Surgical History:   Procedure Laterality Date    HX CHOLECYSTECTOMY      HX KNEE REPLACMENT Left     HX ROTATOR CUFF REPAIR Left     SKIN CANCER EXCISION      forehead removed         Medication:  Blood-Glucose Meter,Continuous (FREESTYLE LIBRE 3 READER) Does not apply Misc, 1 Each Every 6 hours as needed  clindamycin (CLEOCIN) 150 mg Oral Capsule, Take 1 Capsule (150 mg total) by mouth Four times a day  d-mannose 500 mg Oral Capsule, Take  1 Capsule (500 mg total) by mouth Twice daily  diazePAM  (VALIUM ) 5 mg Oral Tablet, Take 1 Tablet (5 mg total) by mouth Every 12 hours as needed for Anxiety  empagliflozin  (JARDIANCE ) 25 mg Oral Tablet, Take 1 Tablet (25 mg total) by mouth Once a day  enalapril  maleate (VASOTEC ) 10 mg Oral Tablet, Take 1 Tablet (10 mg total) by mouth Daily  ergocalciferol , vitamin D2, (DRISDOL ) 1,250 mcg (50,000 unit) Oral Capsule, Take 1 Capsule (50,000 Units total) by mouth Every 7 days  HYDROcodone -acetaminophen  (NORCO) 7.5-325 mg Oral Tablet, Take 1 Tablet by mouth Every 8 hours as needed for up to 30 days Indications: pain  insulin  glargine U-300 conc (TOUJEO  MAX U-300 SOLOSTAR) 300 unit/mL (3 mL) Subcutaneous Insulin  Pen, Inject 35 Units under the skin Every night  insulin  lispro (HUMALOG  KWIKPEN INSULIN ) 100 unit/mL Subcutaneous Insulin  Pen, Use as directed sliding scale; also use 5 units with meals Indications: type 2 diabetes  mellitus  latanoprost  (XALATAN ) 0.005 % Ophthalmic Drops, Administer 1 Drop into the left eye Every night  Levetiracetam  500 mg Oral Tablet Sustained Release 24 hr, Take 1 Tablet (500 mg total) by mouth Every night  magnesium  oxide (MAG-OX) 400 mg Oral Tablet, Take 1 Tablet (400 mg total) by mouth Twice daily  MetFORMIN  (GLUCOPHAGE ) 1,000 mg Oral Tablet, Take 1 Tablet (1,000 mg total) by mouth Twice daily with food  ondansetron  (ZOFRAN  ODT) 4 mg Oral Tablet, Rapid Dissolve, Take 1 Tablet (4 mg total) by mouth Every 8 hours as needed for Nausea/Vomiting  rosuvastatin  (CRESTOR ) 20 mg Oral Tablet, Take 1 Tablet (20 mg total) by mouth Every evening  semaglutide  (OZEMPIC ) 1 mg/dose (4 mg/3 mL) Subcutaneous Pen Injector, Inject 1 mg under the skin Every 7 days  solifenacin  (VESICARE ) 5 mg Oral Tablet, Take 1 Tablet (5 mg total) by mouth Once a day  SUMAtriptan  (IMITREX ) 50 mg Oral Tablet, Take 1 Tablet (50 mg total) by mouth Once, as needed for Migraine TAKE 1 TABLET BY MOUTH AT ONSET OF HEADACHE. IF NO RELIEF MAY REPEAT 1 AFTER AT LEAST 2 HOURS. MAX OF 4 TABS PER 24 HOURS  venlafaxine  (EFFEXOR  XR) 37.5 mg Oral Capsule, Sust. Release 24 hr, Take 1 Capsule (37.5 mg total) by mouth Every night  Blood-Glucose Sensor (FREESTYLE LIBRE 3 SENSOR) Does not apply Device, 1 Device Every 14 days    No facility-administered medications prior to visit.    Allergies:Allergies[1]    Family Medical History:       Problem Relation (Age of Onset)    Kidney failure Mother    No Known Problems Sister, Brother, Half-Sister, Half-Brother, Maternal Aunt, Maternal Uncle, Paternal Aunt, Paternal Uncle, Maternal Grandmother, Maternal Grandfather, Paternal Grandmother, Paternal Grandfather, Daughter, Son    Respiratory Problems Father            Social History[2]  Social History     Social History Narrative    Not on file       Nursing Notes:   Burnetta Legions, LPN  89/91/74 8783  Signed  Patient presents for a pre op eval.     Physical  Exam:  Vitals:    09/28/24 1214   BP: 112/76   Pulse: 60   SpO2: 100%   Weight: 78.9 kg (174 lb)      Physical Exam  Vitals and nursing note reviewed.   Constitutional:       Appearance: Normal appearance. She is obese.   HENT:      Right Ear: Tympanic membrane  normal.      Left Ear: Tympanic membrane normal.      Mouth/Throat:      Mouth: Mucous membranes are moist.      Pharynx: Oropharynx is clear.   Eyes:      Extraocular Movements: Extraocular movements intact.      Pupils: Pupils are equal, round, and reactive to light.   Cardiovascular:      Rate and Rhythm: Normal rate and regular rhythm.      Pulses: Normal pulses.   Pulmonary:      Effort: Pulmonary effort is normal.      Breath sounds: Normal breath sounds.   Abdominal:      General: Bowel sounds are normal.      Palpations: Abdomen is soft.      Tenderness: There is no abdominal tenderness. There is no right CVA tenderness, left CVA tenderness or rebound.   Musculoskeletal:         General: No swelling or tenderness. Normal range of motion.      Cervical back: Normal range of motion and neck supple.      Comments: Arthritic changes hands and knees noted bilateral  Chronic trigger points upper back and thigh area  Chronic low back pain with range of motion  Negative straight leg raises  Gait stable   +tenderness on palp left deltoid w weakness of left arm due to pain thus ROM limited  Skin:     General: Skin is warm.      Findings: No lesion or rash.   Neurological:      General: No focal deficit present.      Mental Status: She is alert and oriented to person, place, and time.      Sensory: No sensory deficit.      Motor: generalized weakness lower extrem present.      Gait: Gait normal.   Psychiatric:         Mood and Affect: Mood normal.         Behavior: Behavior normal.         Thought Content: Thought content normal.         Judgment: Judgment normal.   Assessment & Plan  Pre-op evaluation  Preoperative evaluation for reverse shoulder  replacement  Anesthesia plan includes spinal anesthesia. A1c at 7.1 indicates controlled diabetes. No current chest pain.  - Stop Ozempic  one week prior to surgery.  - Do not take any antidiabetic medications on the morning of surgery except for blood pressure medication with a small sip of water .  -fructosamine collected August 20th 284; BUN 13 creatinine 0.88 GFR 68  - obtain EKG and urine today.  Type 2 diabetes mellitus without complication, with long-term current use of insulin     Type 2 diabetes mellitus managed with insulin  and Ozempic . A1c at 7.1 indicates controlled diabetes.  - Stop Ozempic  one week prior to surgery.  - Continue Toujeo  along with Jardiance  and metformin  as directed  - Do not take any antidiabetic medications on the morning of surgery.  Essential hypertension    Essential hypertension managed with enalapril  10 mg daily antihypertensive medication.  Blood pressure stable today at 112/76.  - Take blood pressure medication with a small sip of water  on the morning of surgery.  Chronic kidney disease (CKD), stage II (mild)  History of chronic kidney disease with renal function now normal range.  Monitoring continued.  Recurrent UTI  History of recurrent UTIs with urinalysis obtained today.  No current  urinary symptoms noted     Orders Placed This Encounter    URINALYSIS, MACROSCOPIC AND MICROSCOPIC W/CULTURE REFLEX    URINALYSIS, MACROSCOPIC    URINALYSIS, MICROSCOPIC    CANCELED: EKG - Perfrmed at [facility]    [address] - ECG In Clinic ([facility] [employer])          Perioperative Risk Stratification  Patient is scheduled to have reverse shoulder replacement left performed by Dr. Thom Gaskins on pending.    Given that surgery is considered to be Low for Cardiac Events, Functional Capacity is estimated at Unknown, and patient has the following clinical predictors : patient is considered to be of Medium risk for this Low  risk procedure according to the 2014 ACC Guidelines.     UA and Culture, 12  Lead EKG  was ordered today.                             Follow up:   Post-Discharge Follow Up Appointments       Monday Oct 10, 2024    Return Patient Visit with Scharlene No, PA-C at 11:30 AM      Monday Nov 07, 2024    Return Patient Visit with Scharlene No, PA-C at 11:00 AM      Wednesday Dec 07, 2024    Return Patient Visit with Scharlene No, PA-C at 10:00 AM      Thursday May 25, 2025    Return Telephone Visit with Scharlene No, PA-C at  1:30 PM      [facility]  [facility]  [address]  [place] [address]  [phone]           Seek medical attention for new or worsening symptoms.  Patient has been seen in this clinic within the last 3 years.       I personally reviewed the documentation of care provided by the certified physician assistant and I agree with her medical decision making, Jhonny Romano, D.O.   Amy Alvis, PA-C          This note was partially created using [employer] Fluency Direct system (voice recognition software) and is inherently subject to errors including those of syntax and sound-alike substitutions which may escape proofreading.  In such instances, original meaning may be extrapolated by contextual derivation.         [1]   Allergies  Allergen Reactions    Codeine Nausea/ Vomiting    Amoxicillin Diarrhea    Macrobid  [Nitrofurantoin  Monohyd/M-Cryst] Nausea/ Vomiting   [2]   Social History  Tobacco Use    Smoking status: Never    Smokeless tobacco: Never   Vaping Use    Vaping status: Never Used   Substance Use Topics    Alcohol use: Never    Drug use: Never

## 2024-09-28 NOTE — Assessment & Plan Note (Signed)
 History of chronic kidney disease with renal function now normal range.  Monitoring continued.

## 2024-09-28 NOTE — Assessment & Plan Note (Signed)
 Type 2 diabetes mellitus managed with insulin  and Ozempic . A1c at 7.1 indicates controlled diabetes.  - Stop Ozempic  one week prior to surgery.  - Continue Toujeo  along with Jardiance  and metformin  as directed  - Do not take any antidiabetic medications on the morning of surgery.

## 2024-09-29 ENCOUNTER — Ambulatory Visit (RURAL_HEALTH_CENTER): Payer: Self-pay | Admitting: Family Medicine

## 2024-09-29 NOTE — Result Encounter Note (Signed)
 EKG shows sinus rhythm at 67 beats per minute

## 2024-09-30 LAB — URINE CULTURE,ROUTINE: URINE CULTURE: 3000 — AB

## 2024-10-10 ENCOUNTER — Other Ambulatory Visit (INDEPENDENT_AMBULATORY_CARE_PROVIDER_SITE_OTHER): Payer: Self-pay | Admitting: Physician Assistant

## 2024-10-10 ENCOUNTER — Ambulatory Visit: Payer: Self-pay | Attending: Physician Assistant | Admitting: Physician Assistant

## 2024-10-10 ENCOUNTER — Encounter (INDEPENDENT_AMBULATORY_CARE_PROVIDER_SITE_OTHER): Payer: Self-pay | Admitting: Physician Assistant

## 2024-10-10 ENCOUNTER — Other Ambulatory Visit: Payer: Self-pay

## 2024-10-10 VITALS — BP 118/74 | HR 75 | Wt 171.8 lb

## 2024-10-10 DIAGNOSIS — Z794 Long term (current) use of insulin: Secondary | ICD-10-CM | POA: Insufficient documentation

## 2024-10-10 DIAGNOSIS — E538 Deficiency of other specified B group vitamins: Secondary | ICD-10-CM | POA: Insufficient documentation

## 2024-10-10 DIAGNOSIS — E11649 Type 2 diabetes mellitus with hypoglycemia without coma: Secondary | ICD-10-CM | POA: Insufficient documentation

## 2024-10-10 DIAGNOSIS — F419 Anxiety disorder, unspecified: Secondary | ICD-10-CM | POA: Insufficient documentation

## 2024-10-10 DIAGNOSIS — N390 Urinary tract infection, site not specified: Secondary | ICD-10-CM | POA: Insufficient documentation

## 2024-10-10 DIAGNOSIS — G8929 Other chronic pain: Secondary | ICD-10-CM | POA: Insufficient documentation

## 2024-10-10 DIAGNOSIS — I10 Essential (primary) hypertension: Secondary | ICD-10-CM | POA: Insufficient documentation

## 2024-10-10 DIAGNOSIS — M545 Low back pain, unspecified: Secondary | ICD-10-CM | POA: Insufficient documentation

## 2024-10-10 LAB — URINALYSIS, MACROSCOPIC
BILIRUBIN: NEGATIVE mg/dL
BLOOD: NEGATIVE mg/dL
GLUCOSE: >1000 mg/dL — AB
KETONES: NEGATIVE mg/dL
LEUKOCYTES: 25 WBCs/uL — AB
NITRITE: NEGATIVE
PH: 5 (ref 5.0–9.0)
PROTEIN: NEGATIVE mg/dL
SPECIFIC GRAVITY: 1.038 — ABNORMAL HIGH (ref 1.002–1.030)
UROBILINOGEN: NORMAL mg/dL

## 2024-10-10 LAB — URINALYSIS, MICROSCOPIC
RBCS: 3 /HPF (ref <4)
SQUAMOUS EPITHELIAL: 1 /HPF (ref <28)
WBCS: 4 /HPF (ref <6)

## 2024-10-10 MED ORDER — DIAZEPAM 5 MG TABLET
5.0000 mg | ORAL_TABLET | Freq: Two times a day (BID) | ORAL | 0 refills | Status: DC | PRN
Start: 2024-10-10 — End: 2024-11-07

## 2024-10-10 MED ORDER — HYDROCODONE 7.5 MG-ACETAMINOPHEN 325 MG TABLET: 1.0000 | ORAL_TABLET | Freq: Three times a day (TID) | ORAL | 0 refills | Status: DC | PRN

## 2024-10-10 MED ORDER — METFORMIN 1,000 MG TABLET: 500.0000 mg | ORAL_TABLET | Freq: Two times a day (BID) | ORAL | Status: DC

## 2024-10-10 MED ORDER — CYANOCOBALAMIN (VIT B-12) 1,000 MCG/ML INJECTION SOLUTION
1000.0000 ug | INTRAMUSCULAR | Status: AC
Start: 2024-10-10 — End: 2024-10-10
  Administered 2024-10-10: 1000 ug via SUBCUTANEOUS

## 2024-10-10 MED ORDER — ONDANSETRON 4 MG DISINTEGRATING TABLET
4.0000 mg | ORAL_TABLET | Freq: Three times a day (TID) | ORAL | 1 refills | Status: AC | PRN
Start: 2024-10-10 — End: ?

## 2024-10-10 NOTE — Assessment & Plan Note (Signed)
 Chronic anxiety controlled with use of Valium .  -  Continue valium  5mg  bid- refills given

## 2024-10-10 NOTE — Nursing Note (Signed)
Patient presents for 1-month follow-up.

## 2024-10-10 NOTE — Assessment & Plan Note (Signed)
 Vitamin B12 deficiency  - Administer B12 injection today.

## 2024-10-10 NOTE — Progress Notes (Signed)
 [facility]  [address]  [place] [address]          Follow Up        Reason for Visit: Follow Up (Patient presents for 1 month follow up) B12 shot    This note was created with assistance from [employer] via capture of conversational audio.  Consent was obtained from the patient prior to recording.      History of Present Illness  Grace Hoffman is a 73 year old female with diabetes and chronic pain who presents for follow-up and medication refills.    Informs me that she is in the process of having a new set of dentures made and has a recurring abscess on her lower jaw.  She has also scheduled for surgery on her left shoulder November 18th.    Her diabetes management includes Ozempic  1 mg weekly, Jardiance  25 mg daily, Metformin  1000 mg twice a day, and Toujeo  35 units at night. She occasionally uses Humalog  on a sliding scale. She reports a couple incidents of low blood sugar readings and this morning states her Herlene went off noting a level of 38 mg/dL at 3 AM, which she attributes to her current medication regimen.  Last A1c 7.1.    Hx of CKD stage 2 as well noted w last labs stable and continues following w nephrologist.     Follow-up blood pressure/hypertension w BP currently stable at 118/74.  She continues taking Vasotec  10 mg daily as well as hydrochlorothiazide  25 mg daily p.r.n.SABRA   Denies chest pain.  She does continue to have chronic dizziness for which she is seeing the neurologist and has had a MRI of her brain which noted signs of TIAs.  Pt has Occasional headaches noted and has had eval by neurologist as well in the past which was unremarkable. She has  also has been seen by the ENT where she had her crystals realigned.  She states this helped a little bit but she feels like her balance is still an issue.     Follow-up B12 deficiency as well as magnesium  deficiency with chronic fatigue issues noted.  Patient is requesting B12 injection today which do seem to help with  her energy level.  Last B12 level when checked in lab stable however.     Follow-up recurrent UTI which patient at this time denies any urinary symptoms.    Follow-up urinalysis needed today.     Follow-up chronic pain/DJD/fibromyalgia for which she has been on chronic pain therapy/narcotics for numerous years.  Patient had reported increased pain in her lower back with x-rays showing moderate DJD the MRI of spine ordered.  She did have an MRI and  was referred to [facility] neurosurgeon.  She also is currently taking Norco 7.5 mg q.6 hours p.r.n. which does help with her chronic pain issues.  She denies any side effects to medication and compliant w urine drug screen.  Requesting medication refill.     Follow-up anxiety/depression with patient currently on Valium  5 mg daily p.r.n. as well as Effexor  37.5 mg daily.  Patient denies panic attack and states anxiety depression overall stable at this current time.    PHQ Questionnaire         Past Medical History:   Diagnosis Date    Anxiety     Chronic back pain     Chronic pain 04/03/2022    COVID-19 vaccine series completed     Depression  Diabetes mellitus, type 2     Fibromyalgia affecting multiple sites     Hypertension     Hypomagnesemia     Insomnia disorder     Migraine     Mixed hyperlipidemia     Orthostatic hypotension     Osteoarthritis     TIA (transient ischemic attack)     Unspecified glaucoma(365.9)     Vitamin D  deficiency          Past Surgical History:   Procedure Laterality Date    HX CHOLECYSTECTOMY      HX KNEE REPLACMENT Left     HX ROTATOR CUFF REPAIR Left     SKIN CANCER EXCISION      forehead removed      Family Medical History:       Problem Relation (Age of Onset)    Kidney failure Mother    No Known Problems Sister, Brother, Half-Sister, Half-Brother, Maternal Aunt, Maternal Uncle, Paternal Aunt, Paternal Uncle, Maternal Grandmother, Maternal Grandfather, Paternal Grandmother, Paternal Grandfather, Daughter, Son    Respiratory Problems  Father            Social History     Tobacco Use    Smoking status: Never    Smokeless tobacco: Never   Vaping Use    Vaping status: Never Used   Substance Use Topics    Alcohol use: Never    Drug use: Never     Medication:  Blood-Glucose Meter,Continuous (FREESTYLE LIBRE 3 READER) Does not apply Misc, 1 Each Every 6 hours as needed  Blood-Glucose Sensor (FREESTYLE LIBRE 3 SENSOR) Does not apply Device, 1 Device Every 14 days  clindamycin (CLEOCIN) 150 mg Oral Capsule, Take 1 Capsule (150 mg total) by mouth Four times a day  d-mannose 500 mg Oral Capsule, Take 1 Capsule (500 mg total) by mouth Twice daily  empagliflozin  (JARDIANCE ) 25 mg Oral Tablet, Take 1 Tablet (25 mg total) by mouth Once a day  ergocalciferol , vitamin D2, (DRISDOL ) 1,250 mcg (50,000 unit) Oral Capsule, Take 1 Capsule (50,000 Units total) by mouth Every 7 days  insulin  glargine U-300 conc (TOUJEO  MAX U-300 SOLOSTAR) 300 unit/mL (3 mL) Subcutaneous Insulin  Pen, Inject 35 Units under the skin Every night  insulin  lispro (HUMALOG  KWIKPEN INSULIN ) 100 unit/mL Subcutaneous Insulin  Pen, Use as directed sliding scale; also use 5 units with meals Indications: type 2 diabetes mellitus  latanoprost  (XALATAN ) 0.005 % Ophthalmic Drops, Administer 1 Drop into the left eye Every night  Levetiracetam  500 mg Oral Tablet Sustained Release 24 hr, Take 1 Tablet (500 mg total) by mouth Every night  magnesium  oxide (MAG-OX) 400 mg Oral Tablet, Take 1 Tablet (400 mg total) by mouth Twice daily  rosuvastatin  (CRESTOR ) 20 mg Oral Tablet, Take 1 Tablet (20 mg total) by mouth Every evening  semaglutide  (OZEMPIC ) 1 mg/dose (4 mg/3 mL) Subcutaneous Pen Injector, Inject 1 mg under the skin Every 7 days  solifenacin  (VESICARE ) 5 mg Oral Tablet, Take 1 Tablet (5 mg total) by mouth Once a day  SUMAtriptan  (IMITREX ) 50 mg Oral Tablet, Take 1 Tablet (50 mg total) by mouth Once, as needed for Migraine TAKE 1 TABLET BY MOUTH AT ONSET OF HEADACHE. IF NO RELIEF MAY REPEAT 1 AFTER AT  LEAST 2 HOURS. MAX OF 4 TABS PER 24 HOURS  venlafaxine  (EFFEXOR  XR) 37.5 mg Oral Capsule, Sust. Release 24 hr, Take 1 Capsule (37.5 mg total) by mouth Every night  diazePAM  (VALIUM ) 5 mg Oral Tablet, Take 1 Tablet (5 mg  total) by mouth Every 12 hours as needed for Anxiety  enalapril  maleate (VASOTEC ) 10 mg Oral Tablet, Take 1 Tablet (10 mg total) by mouth Daily  HYDROcodone -acetaminophen  (NORCO) 7.5-325 mg Oral Tablet, Take 1 Tablet by mouth Every 8 hours as needed for up to 30 days Indications: pain  MetFORMIN  (GLUCOPHAGE ) 1,000 mg Oral Tablet, Take 1 Tablet (1,000 mg total) by mouth Twice daily with food  ondansetron  (ZOFRAN  ODT) 4 mg Oral Tablet, Rapid Dissolve, Take 1 Tablet (4 mg total) by mouth Every 8 hours as needed for Nausea/Vomiting    No facility-administered medications prior to visit.    Allergies:  Allergies   Allergen Reactions    Codeine Nausea/ Vomiting    Amoxicillin Diarrhea    Macrobid  [Nitrofurantoin  Monohyd/M-Cryst] Nausea/ Vomiting       Physical Exam:  Vitals:    10/10/24 1144   BP: 118/74   Pulse: 75   SpO2: 96%   Weight: 77.9 kg (171 lb 12.8 oz)        Physical Exam  Vitals and nursing note reviewed.   Constitutional:       Appearance: Normal appearance. She is obese.   HENT:      Right Ear: Tympanic membrane normal.      Left Ear: Tympanic membrane normal.      Mouth/Throat:      Mouth: Mucous membranes are moist.      Pharynx: Oropharynx is clear.   Eyes:      Extraocular Movements: Extraocular movements intact.      Pupils: Pupils are equal, round, and reactive to light.   Cardiovascular:      Rate and Rhythm: Normal rate and regular rhythm.      Pulses: Normal pulses.   Pulmonary:      Effort: Pulmonary effort is normal.      Breath sounds: Normal breath sounds.   Abdominal:      General: Bowel sounds are normal.      Palpations: Abdomen is soft.      Tenderness: There is no abdominal tenderness. There is no right CVA tenderness, left CVA tenderness or rebound.   Musculoskeletal:          General: No swelling or tenderness. Normal range of motion.      Cervical back: Normal range of motion and neck supple.      Comments: Arthritic changes hands and knees noted bilateral  Chronic trigger points upper back and thigh area  Chronic low back pain with range of motion  Negative straight leg raises  Gait stable   +tenderness on palp left deltoid w weakness of left arm due to pain thus ROM limited  Skin:     General: Skin is warm.      Findings: No lesion or rash.   Neurological:      General: No focal deficit present.      Mental Status: She is alert and oriented to person, place, and time.      Sensory: No sensory deficit.      Motor: generalized weakness lower extrem present.      Gait: Gait normal.   Psychiatric:         Mood and Affect: Mood normal.         Behavior: Behavior normal.         Thought Content: Thought content normal.         Judgment: Judgment normal.     Assessment & Plan  Essential hypertension  Hypertension with blood  pressure stable today 118/74.  -Continue blood pressure monitoring  Type 2 diabetes mellitus with hypoglycemia without coma, with long-term current use of insulin  (CMS HCC)  Type 2 diabetes mellitus with hypoglycemia episodes  Hypoglycemia likely due to evening Metformin  dose.  - Decrease Metformin  to 500 mg twice daily.  - Instruct to cut current Metformin  tablets in half to achieve the new dose.  - Bring glucose reader to the next appointment to review averages.  - continue Ozempic  along with Jardiance  and Toujeo  as noted  Recurrent UTI  Recurrent urinary tract infection without current urinary symptoms.  - Perform urinalysis to check for UTI.  B12 deficiency  Vitamin B12 deficiency  - Administer B12 injection today.  Chronic low back pain, unspecified back pain laterality, unspecified whether sciatica present  Chronic pain and osteoarthritis  Severe arm pain affecting daily activities. Pain management ongoing with Norco.  -Continue Norco 7.5 mg q.8 hours-refills  given  Anxiety  Chronic anxiety controlled with use of Valium .  -  Continue valium  5mg  bid- refills given     Orders Placed This Encounter    URINALYSIS, MACROSCOPIC AND MICROSCOPIC W/CULTURE REFLEX    URINALYSIS, MACROSCOPIC    URINALYSIS, MICROSCOPIC    diazePAM  (VALIUM ) 5 mg Oral Tablet    cyanocobalamin  (VITAMIN B12) 1000 mcg/mL injection    ondansetron  (ZOFRAN  ODT) 4 mg Oral Tablet, Rapid Dissolve    MetFORMIN  (GLUCOPHAGE ) 1,000 mg Oral Tablet       Post-Discharge Follow Up Appointments       Monday Nov 07, 2024    Return Patient Visit with Scharlene No, PA-C at 11:00 AM      Wednesday Dec 07, 2024    Return Patient Visit with Scharlene No, PA-C at 10:00 AM      Thursday May 25, 2025    Return Telephone Visit with Scharlene No, PA-C at  1:30 PM      [facility], [facility]  [facility]  [address]  [place] [address]  [phone]              Seek medical attention for new or worsening symptoms.  Patient has been seen in this clinic within the last 3 years.       I personally reviewed the documentation of care provided by the certified physician assistant and I agree with her medical decision making, Jhonny Romano, D.O.   Amy Alvis, PA-C          This note was partially created using MModal Fluency Direct system (voice recognition software) and is inherently subject to errors including those of syntax and sound-alike substitutions which may escape proofreading.  In such instances, original meaning may be extrapolated by contextual derivation.

## 2024-10-10 NOTE — Assessment & Plan Note (Signed)
 Chronic pain and osteoarthritis  Severe arm pain affecting daily activities. Pain management ongoing with Norco.  -Continue Norco 7.5 mg q.8 hours-refills given

## 2024-10-10 NOTE — Assessment & Plan Note (Signed)
 Type 2 diabetes mellitus with hypoglycemia episodes  Hypoglycemia likely due to evening Metformin  dose.  - Decrease Metformin  to 500 mg twice daily.  - Instruct to cut current Metformin  tablets in half to achieve the new dose.  - Bring glucose reader to the next appointment to review averages.  - continue Ozempic  along with Jardiance  and Toujeo  as noted

## 2024-10-10 NOTE — Assessment & Plan Note (Signed)
 Recurrent urinary tract infection without current urinary symptoms.  - Perform urinalysis to check for UTI.

## 2024-10-10 NOTE — Assessment & Plan Note (Signed)
 Hypertension with blood pressure stable today 118/74.  -Continue blood pressure monitoring

## 2024-10-11 ENCOUNTER — Ambulatory Visit (INDEPENDENT_AMBULATORY_CARE_PROVIDER_SITE_OTHER): Payer: Self-pay

## 2024-10-15 NOTE — Assessment & Plan Note (Signed)
 Urine dip noting signs of infection along with blood.  -order urinalysis microscopic with culture  -prescribe Bactrim  DS b.i.d. times 10 days

## 2024-10-15 NOTE — Assessment & Plan Note (Signed)
 A1c improvement noted with requests to also obtain fructosamine level per orthopedic request.  -glucose A1c fructosamine obtained in blood work  - Continue current meds along w diet BS monitoring

## 2024-10-15 NOTE — Assessment & Plan Note (Signed)
 Chronic fatigue issues with B12 deficiency noted.  Received B12 injections monthly.    -administer B12 IM

## 2024-10-15 NOTE — Assessment & Plan Note (Signed)
 Anxiety depression controlled with Effexor  as well as Valium .  -continue current meds along with follow-up

## 2024-10-15 NOTE — Assessment & Plan Note (Signed)
 Chronic back pain/fibromyalgia for which patient is On chronic pain management.  -Continue Norco 7.5 mg q.8 hours-refills given

## 2024-10-15 NOTE — Assessment & Plan Note (Signed)
 BP stable on current meds.  -Continue medications along with blood pressure monitoring

## 2024-11-07 ENCOUNTER — Other Ambulatory Visit: Payer: Self-pay

## 2024-11-07 ENCOUNTER — Ambulatory Visit: Payer: Self-pay | Attending: Physician Assistant | Admitting: Physician Assistant

## 2024-11-07 ENCOUNTER — Encounter (INDEPENDENT_AMBULATORY_CARE_PROVIDER_SITE_OTHER): Payer: Self-pay | Admitting: Physician Assistant

## 2024-11-07 ENCOUNTER — Other Ambulatory Visit (INDEPENDENT_AMBULATORY_CARE_PROVIDER_SITE_OTHER): Payer: Self-pay | Admitting: Physician Assistant

## 2024-11-07 DIAGNOSIS — M25512 Pain in left shoulder: Secondary | ICD-10-CM

## 2024-11-07 DIAGNOSIS — M1990 Unspecified osteoarthritis, unspecified site: Secondary | ICD-10-CM

## 2024-11-07 DIAGNOSIS — M545 Low back pain, unspecified: Secondary | ICD-10-CM

## 2024-11-07 DIAGNOSIS — Z794 Long term (current) use of insulin: Secondary | ICD-10-CM

## 2024-11-07 DIAGNOSIS — Z7984 Long term (current) use of oral hypoglycemic drugs: Secondary | ICD-10-CM

## 2024-11-07 DIAGNOSIS — G8929 Other chronic pain: Secondary | ICD-10-CM

## 2024-11-07 DIAGNOSIS — Z7985 Long-term (current) use of injectable non-insulin antidiabetic drugs: Secondary | ICD-10-CM

## 2024-11-07 DIAGNOSIS — E1165 Type 2 diabetes mellitus with hyperglycemia: Secondary | ICD-10-CM

## 2024-11-07 DIAGNOSIS — F39 Unspecified mood [affective] disorder: Secondary | ICD-10-CM

## 2024-11-07 MED ORDER — DIAZEPAM 5 MG TABLET: 5.0000 mg | ORAL_TABLET | Freq: Two times a day (BID) | ORAL | 0 refills | Status: DC | PRN

## 2024-11-07 MED ORDER — LEVETIRACETAM ER 500 MG TABLET,EXTENDED RELEASE 24 HR: 1.0000 | ORAL_TABLET | Freq: Every evening | ORAL | 1 refills | Status: DC

## 2024-11-07 MED ORDER — ERGOCALCIFEROL (VITAMIN D2) 1,250 MCG (50,000 UNIT) CAPSULE: 50000.0000 [IU] | ORAL_CAPSULE | ORAL | 1 refills | Status: DC

## 2024-11-07 NOTE — Progress Notes (Signed)
 [facility]  [address]  [place] [address]        Telephone Visit    Name:  Grace Hoffman MRN: [id number]   Date:  11/07/2024 Age:   73 y.o.     The patient/family initiated a request for telephone service.  Verbal consent for this service was obtained from the patient/family.    This note was created with assistance from [employer] via capture of conversational audio.  Consent was obtained from the patient prior to recording.      TELEMEDICINE DOCUMENTATION:    [facility]    Patient/family aware of provider location:  yes  Patient/family consent for telemedicine:  yes  Examination observed and performed by:  Greig Seats, PA-C       Chief Complaint   Patient presents with    Medication Refill        History of Present Illness  Grace Hoffman is a 73 year old female who has been seen today via TeleMed for routine follow-up medication refills.     Patient is scheduled for left shoulder reverse arthroplasty, tomorrow to be performed by orthopedic in [place].     She has a history of uncontrolled diabetes with last A1c  significantly improved to 7.1.  States that her blood sugar has been running decent outpatient also.     She has a history of chronic back pain issues as well as fibromyalgia for which she has been on chronic pain management of Norco 7.5 mg every 8 hours PRN for multiple years.  Patient has no side effects sedation from medication use and states without the pain med she would be unable to do daily activities or even be mobile.  She notes also has a history of chronic anxiety and has been on Valium  5 mg b.i.d. as well multiple years without side effects sedation or issue.  She is aware of the increased risk with concurrent medications and age greater than 49 thus we discussed possibly trying to wean down on Valium  which she is agreeable to.  Medication will be taken to 2.5 mg b.i.d. in trial.  Patient does have an up-to-date Narcan available in his  compliant on UDS.       Current medications:   Blood-Glucose Meter,Continuous (FREESTYLE LIBRE 3 READER) Does not apply Misc 1 Each Every 6 hours as needed    Blood-Glucose Sensor (DEXCOM G7 SENSOR) Does not apply Device 1 Device Every 10 days for 90 days    clindamycin  (CLEOCIN ) 150 mg Oral Capsule Take 1 Capsule (150 mg total) by mouth Four times a day    d-mannose 500 mg Oral Capsule Take 1 Capsule (500 mg total) by mouth Twice daily    diazePAM  (VALIUM ) 5 mg Oral Tablet Take 0.5 Tablets (2.5 mg total) by mouth Every 12 hours as needed for Anxiety    empagliflozin  (JARDIANCE ) 25 mg Oral Tablet Take 1 Tablet (25 mg total) by mouth Once a day    ergocalciferol , vitamin D2, (DRISDOL ) 1,250 mcg (50,000 unit) Oral Capsule Take 1 Capsule (50,000 Units total) by mouth Every 7 days    [START ON 12/29/2024] HYDROcodone -acetaminophen  (NORCO) 7.5-325 mg Oral Tablet Take 1 Tablet by mouth Every 8 hours as needed for up to 30 days Indications: pain    insulin  glargine U-300 conc (TOUJEO  MAX U-300 SOLOSTAR) 300 unit/mL (3 mL) Subcutaneous Insulin  Pen Inject 35 Units under the skin Every night    insulin  lispro (HUMALOG  KWIKPEN INSULIN ) 100  unit/mL Subcutaneous Insulin  Pen Use as directed sliding scale; also use 5 units with meals Indications: type 2 diabetes mellitus    latanoprost  (XALATAN ) 0.005 % Ophthalmic Drops Administer 1 Drop into the left eye Every night    Levetiracetam  500 mg Oral Tablet Sustained Release 24 hr Take 1 Tablet (500 mg total) by mouth Every night    magnesium  oxide (MAG-OX) 400 mg Oral Tablet Take 1 Tablet (400 mg total) by mouth Twice daily    MetFORMIN  (GLUCOPHAGE ) 1,000 mg Oral Tablet Take 0.5 Tablets (500 mg total) by mouth Twice daily with food    ondansetron  (ZOFRAN  ODT) 4 mg Oral Tablet, Rapid Dissolve Take 1 Tablet (4 mg total) by mouth Every 8 hours as needed for Nausea/Vomiting    rosuvastatin  (CRESTOR ) 20 mg Oral Tablet Take 1 Tablet (20 mg total) by mouth Every evening    semaglutide   (OZEMPIC ) 1 mg/dose (4 mg/3 mL) Subcutaneous Pen Injector Inject 1 mg under the skin Every 7 days    solifenacin  (VESICARE ) 5 mg Oral Tablet Take 1 Tablet (5 mg total) by mouth Once a day    SUMAtriptan  (IMITREX ) 50 mg Oral Tablet Take 1 Tablet (50 mg total) by mouth Once, as needed for Migraine TAKE 1 TABLET BY MOUTH AT ONSET OF HEADACHE. IF NO RELIEF MAY REPEAT 1 AFTER AT LEAST 2 HOURS. MAX OF 4 TABS PER 24 HOURS    venlafaxine  (EFFEXOR  XR) 37.5 mg Oral Capsule, Sust. Release 24 hr Take 1 Capsule (37.5 mg total) by mouth Every night        Allergies[1]     Past Medical History:   Diagnosis Date    Anxiety     Chronic back pain     Chronic pain 04/03/2022    COVID-19 vaccine series completed     Depression     Diabetes mellitus, type 2     Fibromyalgia affecting multiple sites     Hypertension     Hypomagnesemia     Insomnia disorder     Migraine     Mixed hyperlipidemia     Orthostatic hypotension     Osteoarthritis     TIA (transient ischemic attack)     Unspecified glaucoma(365.9)     Vitamin D  deficiency         Social History     Socioeconomic History    Marital status: Married    Number of children: 1    Years of education: 11   Tobacco Use    Smoking status: Never    Smokeless tobacco: Never   Vaping Use    Vaping status: Never Used   Substance and Sexual Activity    Alcohol use: Never    Drug use: Never    Sexual activity: Not Currently     Partners: Male     Social Determinants of Health     Transportation Needs: Not At Risk (11/09/2024)    Received from [facility] - Transportation     In the past 12 months, has lack of reliable transportation kept you from medical appointments, meetings, work or from getting things needed for daily living?: No   Social Connections: Low Risk (07/29/2023)    Social Connections     SDOH Social Isolation: 5 or more times a week        Past Surgical History:   Procedure Laterality Date    HX CHOLECYSTECTOMY      HX KNEE REPLACMENT Left  HX ROTATOR CUFF REPAIR Left      SKIN CANCER EXCISION      forehead removed        Family Medical History:       Problem Relation (Age of Onset)    Kidney failure Mother    No Known Problems Sister, Brother, Half-Sister, Half-Brother, Maternal Aunt, Maternal Uncle, Paternal Aunt, Paternal Uncle, Maternal Grandmother, Maternal Grandfather, Paternal Grandmother, Paternal Grandfather, Daughter, Son    Respiratory Problems Father               Physical Exam:    Physical Exam  Constitutional:       General: She is not in acute distress.  Pulmonary:      Effort: Pulmonary effort is normal.      Comments: Pt able to speak in complete sentences    Neurological:      Mental Status: She is alert.   Psychiatric:         Mood and Affect: Mood normal.         Behavior: Behavior normal.         Thought Content: Thought content normal.         Judgment: Judgment normal.       Assessment & Plan  Uncontrolled type 2 diabetes mellitus with hyperglycemia (CMS HCC)  History of uncontrolled diabetes with last A1c 7.1.   -Continue current meds along w diet BS monitoring  Chronic low back pain, unspecified back pain laterality, unspecified whether sciatica present  Chronic lower back pain requiring pain management.  Patient has been  On chronic pain medications for multiple years without side effectss or sedation.  -  Continue Norco 7.5 mg q.8 hours-refills given  Chronic left shoulder pain  Chronic left shoulder pain for which patient is scheduled to undergo surgery tomorrow with orthopedic surgeon in [place].  Osteoarthritis, unspecified osteoarthritis type, unspecified site  Chronic joint pain with noted osteoarthritis.  Once again patient on chronic pain management.  Mood disorder (CMS HCC)  Chronic anxiety as well as depression with patient currently on Valium  5 mg b.i.d. for which she is attempting a weaning trial of 1/2 pill b.i.d. due to increase risk with medication use in combination with opioid.     Orders Placed This Encounter    ergocalciferol , vitamin  D2, (DRISDOL ) 1,250 mcg (50,000 unit) Oral Capsule    Levetiracetam  500 mg Oral Tablet Sustained Release 24 hr        Total provider time spent with the patient on the phone: 22 minutes.    Post-Discharge Follow Up Appointments       Monday Jan 02, 2025    Return Patient Visit with Scharlene No, PA-C at 10:00 AM      Tuesday Jan 31, 2025    Return Patient Visit with Scharlene No, PA-C at  9:15 AM      Wednesday Mar 01, 2025    Return Patient Visit with Scharlene No, PA-C at 10:30 AM      Thursday May 25, 2025    Return Telephone Visit with Scharlene No, PA-C at  1:30 PM      Internal Medicine, [facility]  [facility], [place]  [address]  [place] [address]  [phone]             This note was partially created using MModal Fluency Direct system (voice recognition software) and is inherently subject to errors including those of syntax and sound-alike substitutions which may escape proofreading.  In  such instances, original meaning may be extrapolated by contextual derivation.    Konica Stankowski, PA-C         [1]   Allergies  Allergen Reactions    Codeine Nausea/ Vomiting    Amoxicillin Diarrhea    Macrobid  [Nitrofurantoin  Monohyd/M-Cryst] Nausea/ Vomiting

## 2024-11-07 NOTE — Telephone Encounter (Signed)
 Routine 1 month follow up for medication refill.

## 2024-11-08 NOTE — OR Surgeon (Signed)
 OPERATIVE REPORT    PATIENT NAME:  Grace Hoffman     DATE OF OPERATION:  11/08/2024     SURGEON:  Dorn Gaskins, MD     PRIMARY CARE PHYSICIAN:  Hampton Curtistine BIRCH, DO     PREOPERATIVE DIAGNOSIS: Left shoulder arthritis.    POSTOPERATIVE DIAGNOSIS:  Left shoulder arthritis.    PROCEDURE: 1.  Primary Left Reverse  total shoulder arthroplasty.  CPT 23472     2.  Removal of deep implant, specifically retained suture anchors and permanent suture material from prior rotator cuff repair.  CPT 20680    ASSISTANT:  Thedora Negri, PA-C.  Necessary for retraction, protection of neurovascular structures, and placement of implants.    ANESTHESIA:  General with Regional Block     ESTIMATED BLOOD LOSS:  100     DISPOSITION:  To recovery room.    COMPONENTS USED:  Implants       Implant       Imp Biocomposite Achilles Tendon [id number] - [id number] - Implanted   (Right) Heel      Inventory item: SYSTEM IMPLANT ACHILLES SPEEDBRIDGE Model/Cat number: [id number]    Manufacturer: [employer] Lot number: [id number]    Tissue?: No Laterality Of Implantation: Right    Implanted By: Orlando Norleen DEL, MD Date Implanted: 07/18/2015    Time Implanted:  3:28 PM Number Implanted: 1      As of 07/18/2015       Status: Implanted                        Shoulder Baseplate Glenoid Rsp 30mm - [id number] - Implanted   (Left) Shoulder      Inventory item: SHOULDER BASEPLATE GLENOID RSP Model/Cat number: [id number]    Manufacturer: [employer] Lot number: [id number]    Tissue?: No Laterality Of Implantation: Left    Implanted By: Gaskins Dorn, MD Date Implanted: 11/08/2024    Time Implanted:  9:29 AM Number Implanted: 1      As of 11/08/2024       Status: Implanted                        Head Glenoid W/Retaining Screw 32mm 508-32-103 - [id number] - Implanted   (Left) Shoulder      Inventory item: HEAD DINO PURPLE SCREW 508-32-103 Model/Cat number: [id number]    Manufacturer: [employer] Lot number: [id number]    Tissue?:  No Laterality Of Implantation: Left    Implanted By: Gaskins Dorn, MD Date Implanted: 11/08/2024    Time Implanted:  9:30 AM Number Implanted: 1      As of 11/08/2024       Status: Implanted                        Shoulder Stem Humeral Sm Shell 6 X 533-06-108 - [id number] - Implanted   (Left) Shoulder      Inventory item: SHOULDER STEM HUMERAL SM SHELL 6 X 533-06-108 Model/Cat number: [id number]    Manufacturer: [employer] Lot number: [id number]    Tissue?: No Laterality Of Implantation: Left    Implanted By: Gaskins Dorn, MD Date Implanted: 11/08/2024    Time Implanted:  9:49 AM Number Implanted: 1      As of 11/08/2024       Status: Implanted  Shoulder Insert 32mm +4 E-Plus - [id number] - Implanted   (Left) Shoulder      Inventory item: SHOULDER INSERT +4 E-PLUS Model/Cat number: [id number]    Manufacturer: [employer] Lot number: [id number]    Tissue?: No Laterality Of Implantation: Left    Implanted By: Gretta Carrier, MD Date Implanted: 11/08/2024    Time Implanted:  9:53 AM Number Implanted: 1      As of 11/08/2024       Status: Implanted                                Type Not Specified       Cement Palacos R+G 1x40 Single (With Gentamicin) - [id number] - Implanted   (Left) Knee      Inventory item: CEMENT PALACOS R+G 1X40 SINGLE (WITH GENTAMICIN) Model/Cat number: [id number]    Manufacturer: [employer] Lot number: [id number]      As of 05/10/2014       Status: Implanted                        Natural Tibia Stemmed - Implanted   (Left) Knee      Model/Cat number: [id number] Manufacturer: [employer]    Lot number: [id number] Size: d      As of 05/10/2014       Status: Implanted                        Femur, Left - Implanted   (Left) Knee      Model/Cat number: [id number] Manufacturer: [employer]    Size: 7        As of 05/10/2014       Status: Implanted                        Post Stabalize - Implanted   (Left) Knee      Model/Cat number: [id number]  Manufacturer: [employer]    Size: 10        As of 05/10/2014       Status: Implanted                        Poly Patella - Implanted   (Left) Knee      Model/Cat number: [id number] Manufacturer: [employer]    Size: 26        As of 05/10/2014       Status: Implanted                        Knee Persone Total Cap Code - [id number] - Implanted   (Left) Knee      Inventory item: KNEE PERSONA TOTAL CAP CODE 98-0002-400-00 Model/Cat number: [id number]    Manufacturer: [employer]        As of 05/10/2014       Status: Implanted                        22mm Peripheral Screw - Implanted   (Left) Shoulder      Model/Cat number: [id number] Manufacturer: [employer]    Lot number: [id number]        As of 11/08/2024       Status: Implanted  18mm Peripheral Screw - Implanted   (Left) Shoulder      Lot number: [id number]        As of 11/08/2024       Status: Implanted                        30mm Peripheral Screw - Implanted   (Left) Shoulder      Model/Cat number: [id number] Manufacturer: [employer]    Lot number: [id number]        As of 11/08/2024       Status: Implanted                        18mm Peripheral Screw - Implanted   (Left) Shoulder      Model/Cat number: [id number] Manufacturer: [employer]    Lot number: [id number]        As of 11/08/2024       Status: Implanted                                       BRIEF HISTORY: A 73 year old female with 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  For further details please see office notes and pre-op history and physical.  The risks and potential outcomes of the surgery have been reviewed and She wishes to proceed.                  DATA REPORTING: Prior to beginning the procedure, a time-out was held to confirm the patient's site and procedure. The patient received 2 g of first-generation cephalosporin (cefazolin)  within 1 hour of the start time of the procedure. Postoperatively, they were ordered 24-hour of same  antibiotic. Intraoperatively, SCD hose was used on both legs.     OPERATION IN DETAIL: The patient was brought to the operating room. The patient was placed in the beachchair position. Bony prominences were padded. Sterile prep and drape of the Left shoulder was done in a standard fashion. A longitudinal incision was made beginning 5 cm medial to the [facility] joint and carried distally.  Electrocautery was used for hemostasis through the subcutaneous tissues. The deltopectoral interval was identified at the fat triangle proximally in the wound.  The cephalic vein was mobilized, protected, and retracted medially with perforating branches to the deltoid cauterized.  The deltopectoral interval was developed.  The subdeltoid and subacromial spaces were cleared of any adhesions using electrocautery and a Brown deltoid retractor was placed.  The clavipectoral fascia was incised and the subcoracoid space was developed.  The long head biceps tendon was identified, tenodesed to the pectoralis major, and the proximal portion resected.  The anterior humeral circumflex vessels were cauterized.  The subscapularis was tagged and released using peel technique and the humeral head was dislocated.  An extramedullary cutting guide was used to make a 135 degree humeral head cut at the level of the anatomic neck.  All humeral osteophytes were resected with rongeur and/or osteotomes.    Next attention was turned to the glenoid.  A hohman retractor was placed posteriorly over the lip of the glenoid.  The axillary nerve was palpated anteriorly and the subscapularis was freed of any adhesions.  Beginning at biceps stump and proceeding from superior to inferior along the the anterior lip of the glenoid the capsulolabral tissues were  resected with electrocautery.  A hohman retractor was placed in the subscapularis fossa anteriorly.  Capsulolabral resection continued inferiorly taking care to protect the axillary nerve.  The posterior capsulolabral  tissues were resected to complete a 360 degree release of the glenoid.  The anterior hohman was replaced with a cobra retractor and a hohman was placed superiorly to give excellent visualization of the glenoid face.     The center of the glenoid was identified.  A 2.5 mm drill bit was used to create a pilot hole and ensure adequate bone stock for glenoid component.  This hole was then tapped and the tap was used as a guide for sequential reaming of the glenoid face.  Reaming proceeded until bleeding bone was noted.  The tap was removed and glenoid baseplate placed.  4 peripheral locking screws were placed using the drill guide.  The glenosphere was impacted onto the [facility] taper of the baseplate and the center screw placed.    Attention was then turned back to the humerus.  A center hole guide was placed over the humeral osteotomy surface and a center hole was drilled.  The metaphysis was reamed.  Next the medullary canal was prepared with sequential reamers until chatter was achieved.  Humeral head autograft was used for impaction grafting of the proximal humerus.  The final humeral component was placed along with a trial liner and the shoulder was taken through a range of motion.  Excellent stability and range of motion were noted.  The final polyethylene liner was impacted onto the humeral component and the shoulder reduced.    The shoulder was irrigated and the subscapularis was closed with multiple fiberwire sutures.  The deltopectoral interval was closed with with 0 vicryl sutures.  The subcutaneous and deep dermal layers were closed with 2-0 vicryl.  3-0 Monocryl and dermabond were used for the skin.      Dorn Gaskins, MD   11/08/2024  10:06 AM

## 2024-11-09 NOTE — Discharge Summary (Signed)
 Discharge Summary:      Admit Date: 11/08/2024    Discharge Date and Time: No discharge date for patient encounter.     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  The patient presents for elective total joint arthroplasty.    Hospital Course:  The patient was admitted to the hospital following PR RECONSTR TOTAL SHOULDER IMPLANT [23472] (LEFT REVERSE SHOULDER REPLACEMENT)  PR REMOVAL DEEP IMPLANT [20680] (X) performed by Dorn Gaskins, MD.  The patient was enrolled in the [facility]placement Program.  Multimodal pain management protocol was used while admitted.    A prescription for oral pain medication was provided as well upon discharge.  The patient progressed well with physical therapy and was deemed stable for discharge home with outpatient physical therapy on No discharge date for patient encounter.. They will follow up with Dorn Gaskins, MD in 10-14 days .  Discharge instructions are reviewed with the patient prior to discharge and the patient verbalizes understanding of the instructions.  Patient will call our office with any questions or concerns prior to the scheduled follow up appointment.  The patient is specifically instructed to call with any fever/chills, foul drainage, continued drainage after 72 hours, expanding erythema, red streaking, or significant increase in  pain or swelling.        Discharge Medications:       Medication List        START taking these medications      ketorolac 10 MG tablet  Commonly known as: Toradol  Take 1 tablet (10 mg total) by mouth every 8 (eight) hours for 5 days.     naloxone 4 MG/0.1ML nasal spray  Commonly known as: Narcan  Instill 1 spray into alternating nostrils every 2 to 3 min as needed for respiratory depression. Follow package instructions for emergencies     oxyCODONE-acetaminophen  5-325 MG per tablet  Commonly known as: Percocet  Take 1 tablet by mouth every 6 (six) hours as needed for up to 13 days. Max Daily Amount: 4 tablets            CONTINUE taking these medications      diazePAM  5 MG tablet  Commonly known as: Valium      insulin  glargine (1 Unit Dial) 300 UNIT/ML pen  Commonly known as: Toujeo  SoloStar     insulin  lispro 100 UNIT/ML injection  Commonly known as: HumaLOG ,AdmeLOG      Jardiance  25 MG tablet  Generic drug: empagliflozin      metFORMIN  1000 MG tablet  Commonly known as: Glucophage      multivitamin PO Tabs     ondansetron  4 MG disintegrating tablet  Commonly known as: Zofran -ODT     Ozempic  (1 MG/DOSE) 4 MG/3ML Sopn  Generic drug: Semaglutide  (1 MG/DOSE)     rosuvastatin  10 MG tablet  Commonly known as: Crestor      venlafaxine  37.5 MG 24  hr capsule  Commonly known as: Effexor -XR     vitamin D  (ergocalciferol ) 1.25 MG (50000 UT) capsule            STOP taking these medications      HYDROcodone -acetaminophen  7.5-325 MG per tab               Where to Get Your Medications        These medications were sent to [facility] [address], [place] - [address]  [address], [place] [address]      Phone: [phone]   ketorolac 10 MG tablet  naloxone 4 MG/0.1ML nasal spray  oxyCODONE-acetaminophen  5-325 MG per tablet         Last HGB/INR:  Lab Results   Component Value Date    HGB 15.7 11/01/2024    INR 1.15 07/27/2015         Disposition: Home-Health Care Svc    Post-op medications sent to the  [facility] @ [facility].  Follow-up post-op with Dr. Gretta 11/21/24 @ 1:30pm.            Laymon LITTIE Fontana, PA-C           11/09/2024           10:42 AM

## 2024-11-22 MED ORDER — HYDROCODONE 7.5 MG-ACETAMINOPHEN 325 MG TABLET: 1.0000 | ORAL_TABLET | Freq: Three times a day (TID) | ORAL | 0 refills | Status: DC | PRN

## 2024-11-22 NOTE — Telephone Encounter (Signed)
 Patient had Oxycodone 5-325 mg q6h for 6 days filled 11/09/24. Norco refill dated 11/29/24 to account for oxycodone supply.

## 2024-12-07 ENCOUNTER — Ambulatory Visit: Payer: Self-pay | Attending: Physician Assistant | Admitting: Physician Assistant

## 2024-12-07 ENCOUNTER — Other Ambulatory Visit: Payer: Self-pay

## 2024-12-07 ENCOUNTER — Encounter (INDEPENDENT_AMBULATORY_CARE_PROVIDER_SITE_OTHER): Payer: Self-pay | Admitting: Physician Assistant

## 2024-12-07 ENCOUNTER — Other Ambulatory Visit (INDEPENDENT_AMBULATORY_CARE_PROVIDER_SITE_OTHER): Payer: Self-pay | Admitting: Physician Assistant

## 2024-12-07 DIAGNOSIS — M797 Fibromyalgia: Secondary | ICD-10-CM

## 2024-12-07 DIAGNOSIS — M545 Low back pain, unspecified: Secondary | ICD-10-CM

## 2024-12-07 DIAGNOSIS — F39 Unspecified mood [affective] disorder: Secondary | ICD-10-CM

## 2024-12-07 DIAGNOSIS — M25512 Pain in left shoulder: Secondary | ICD-10-CM

## 2024-12-07 DIAGNOSIS — Z794 Long term (current) use of insulin: Secondary | ICD-10-CM

## 2024-12-07 DIAGNOSIS — E1165 Type 2 diabetes mellitus with hyperglycemia: Secondary | ICD-10-CM

## 2024-12-07 DIAGNOSIS — Z7985 Long-term (current) use of injectable non-insulin antidiabetic drugs: Secondary | ICD-10-CM

## 2024-12-07 DIAGNOSIS — G8929 Other chronic pain: Secondary | ICD-10-CM

## 2024-12-07 MED ORDER — DEXCOM G7 SENSOR DEVICE: 1.0000 | 3 refills | Status: AC

## 2024-12-07 MED ORDER — DIAZEPAM 5 MG TABLET: 2.5000 mg | ORAL_TABLET | Freq: Two times a day (BID) | ORAL | 0 refills | Status: DC | PRN

## 2024-12-07 NOTE — Assessment & Plan Note (Signed)
 Uncontrolled type 2 diabetes mellitus with hyperglycemia.  Last A1c improved however to 7.1.  Issues with current monitoring system of Freestyle Libre 3 and requesting to switch to Dexcom.  - DC Libre  - Sent prescription for Dexcom continuous glucose monitor every 10 days.  -continue current medications along with diet

## 2024-12-07 NOTE — Telephone Encounter (Signed)
 Patient presents for a 15mo follow up and medication refills

## 2024-12-07 NOTE — Nursing Note (Signed)
 Patient presents for a 56mo follow up, and medication refills.

## 2024-12-07 NOTE — Assessment & Plan Note (Signed)
 Chronic back pain as well as fibromyalgia pain multiple areas for which patient has been on long term chronic pain management.  Currently taking Norco 7.5 mg every 8 hours PRN.  -Continue Norco 7.5 mg q.8 hours-refills given

## 2024-12-07 NOTE — Progress Notes (Cosign Needed)
 [facility]  [address]  [place] [address]        Telephone Visit    Name:  Grace Hoffman MRN: [id number]   Date:  12/07/2024 Age:   73 y.o.     The patient/family initiated a request for telephone service.  Verbal consent for this service was obtained from the patient/family.    TELEMEDICINE DOCUMENTATION:    [facility]    Patient/family aware of provider location:  yes  Patient/family consent for telemedicine:  yes  Examination observed and performed by:  Greig Seats, PA-C     This note was created with assistance from [employer] via capture of conversational audio.  Consent was obtained from the patient prior to recording.      Chief Complaint   Patient presents with    Medication Refill     Follow u/p        History of Present Illness  Grace Hoffman is a 73 year old female who has been seen via TeleMed for follow-up and medication refill.  Is noted that she had recent left shoulder surgery.    Patient once again underwent left shoulder surgery, specifically a post reverse arthroplasty, on November 08, 2024, which was performed as day surgery in [place]. She is currently one month post-operation and is receiving outpatient physical therapy at home.  Patient states she is doing well overall in the pain is controlled currently.    She has a history of uncontrolled diabetes and was previously managing her readings with Herlene however issues with device staying on noted so requesting prescription to change to a Dexcom device.  Her last A1c was significantly improved to 7.1.    She has a history of chronic back pain issues as well as fibromyalgia for which she has been on chronic pain management of Norco 7.5 mg every 8 hours PRN for multiple years.  Patient has no side effects sedation from medication use and states without the pain med she would be unable to do daily activities or even be mobile.  She notes also has a history of chronic anxiety and has been on  Valium  5 mg b.i.d. as well multiple years without side effects sedation or issue.  She is aware of the increased risk with concurrent medications and age greater than 50 thus we discussed possibly trying to wean down on Valium  which she is agreeable to.  Medication will be taken to 2.5 mg b.i.d. in trial.  Patient does have an up-to-date Narcan available in his compliant on UDS.     Current medications:   Blood-Glucose Meter,Continuous (FREESTYLE LIBRE 3 READER) Does not apply Misc 1 Each Every 6 hours as needed    Blood-Glucose Sensor (DEXCOM G7 SENSOR) Does not apply Device 1 Device Every 10 days for 90 days    clindamycin  (CLEOCIN ) 150 mg Oral Capsule Take 1 Capsule (150 mg total) by mouth Four times a day    d-mannose 500 mg Oral Capsule Take 1 Capsule (500 mg total) by mouth Twice daily    diazePAM  (VALIUM ) 5 mg Oral Tablet Take 0.5 Tablets (2.5 mg total) by mouth Every 12 hours as needed for Anxiety    empagliflozin  (JARDIANCE ) 25 mg Oral Tablet Take 1 Tablet (25 mg total) by mouth Once a day    ergocalciferol , vitamin D2, (DRISDOL ) 1,250 mcg (50,000 unit) Oral Capsule Take 1 Capsule (50,000 Units total) by mouth Every 7 days    HYDROcodone -acetaminophen  (NORCO) 7.5-325 mg  Oral Tablet Take 1 Tablet by mouth Every 8 hours as needed for up to 30 days Indications: pain    insulin  glargine U-300 conc (TOUJEO  MAX U-300 SOLOSTAR) 300 unit/mL (3 mL) Subcutaneous Insulin  Pen Inject 35 Units under the skin Every night    insulin  lispro (HUMALOG  KWIKPEN INSULIN ) 100 unit/mL Subcutaneous Insulin  Pen Use as directed sliding scale; also use 5 units with meals Indications: type 2 diabetes mellitus    latanoprost  (XALATAN ) 0.005 % Ophthalmic Drops Administer 1 Drop into the left eye Every night    Levetiracetam  500 mg Oral Tablet Sustained Release 24 hr Take 1 Tablet (500 mg total) by mouth Every night    magnesium  oxide (MAG-OX) 400 mg Oral Tablet Take 1 Tablet (400 mg total) by mouth Twice daily    MetFORMIN  (GLUCOPHAGE )  1,000 mg Oral Tablet Take 0.5 Tablets (500 mg total) by mouth Twice daily with food    ondansetron  (ZOFRAN  ODT) 4 mg Oral Tablet, Rapid Dissolve Take 1 Tablet (4 mg total) by mouth Every 8 hours as needed for Nausea/Vomiting    rosuvastatin  (CRESTOR ) 20 mg Oral Tablet Take 1 Tablet (20 mg total) by mouth Every evening    semaglutide  (OZEMPIC ) 1 mg/dose (4 mg/3 mL) Subcutaneous Pen Injector Inject 1 mg under the skin Every 7 days    solifenacin  (VESICARE ) 5 mg Oral Tablet Take 1 Tablet (5 mg total) by mouth Once a day    SUMAtriptan  (IMITREX ) 50 mg Oral Tablet Take 1 Tablet (50 mg total) by mouth Once, as needed for Migraine TAKE 1 TABLET BY MOUTH AT ONSET OF HEADACHE. IF NO RELIEF MAY REPEAT 1 AFTER AT LEAST 2 HOURS. MAX OF 4 TABS PER 24 HOURS    venlafaxine  (EFFEXOR  XR) 37.5 mg Oral Capsule, Sust. Release 24 hr Take 1 Capsule (37.5 mg total) by mouth Every night        Allergies[1]     Past Medical History:   Diagnosis Date    Anxiety     Chronic back pain     Chronic pain 04/03/2022    COVID-19 vaccine series completed     Depression     Diabetes mellitus, type 2     Fibromyalgia affecting multiple sites     Hypertension     Hypomagnesemia     Insomnia disorder     Migraine     Mixed hyperlipidemia     Orthostatic hypotension     Osteoarthritis     TIA (transient ischemic attack)     Unspecified glaucoma(365.9)     Vitamin D  deficiency         Social History     Socioeconomic History    Marital status: Married    Number of children: 1    Years of education: 11   Tobacco Use    Smoking status: Never    Smokeless tobacco: Never   Vaping Use    Vaping status: Never Used   Substance and Sexual Activity    Alcohol use: Never    Drug use: Never    Sexual activity: Not Currently     Partners: Male     Social Determinants of Health     Transportation Needs: Not At Risk (11/09/2024)    Received from [facility] - Transportation     In the past 12 months, has lack of reliable transportation kept you from medical  appointments, meetings, work or from getting things needed for daily living?: No   Social  Connections: Low Risk (07/29/2023)    Social Connections     SDOH Social Isolation: 5 or more times a week        Past Surgical History:   Procedure Laterality Date    HX CHOLECYSTECTOMY      HX KNEE REPLACMENT Left     HX ROTATOR CUFF REPAIR Left     SKIN CANCER EXCISION      forehead removed        Family Medical History:       Problem Relation (Age of Onset)    Kidney failure Mother    No Known Problems Sister, Brother, Half-Sister, Half-Brother, Maternal Aunt, Maternal Uncle, Paternal Aunt, Paternal Uncle, Maternal Grandmother, Maternal Grandfather, Paternal Grandmother, Paternal Grandfather, Daughter, Son    Respiratory Problems Father               Physical Exam:    Physical Exam  Constitutional:       General: She is not in acute distress.  Pulmonary:      Effort: Pulmonary effort is normal.      Comments: Pt able to speak in complete sentences    Neurological:      Mental Status: She is alert.   Psychiatric:         Mood and Affect: Mood normal.         Behavior: Behavior normal.         Thought Content: Thought content normal.         Judgment: Judgment normal.       Assessment & Plan  Uncontrolled type 2 diabetes mellitus with hyperglycemia (CMS HCC)  Uncontrolled type 2 diabetes mellitus with hyperglycemia.  Last A1c improved however to 7.1.  Issues with current monitoring system of Freestyle Libre 3 and requesting to switch to Dexcom.  - DC Libre  - Sent prescription for Dexcom continuous glucose monitor every 10 days.  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  - Continue outpatient physical therapy at home.  -continue follow-up with Orthopedic as scheduled  Chronic low back pain, unspecified back pain laterality, unspecified whether sciatica present  Chronic back  pain as well as fibromyalgia pain multiple areas for which patient has been on long term chronic pain management.  Currently taking Norco 7.5 mg every 8 hours PRN.  -Continue Norco 7.5 mg q.8 hours-refills given  Fibromyalgia  Chronic back pain as well as fibromyalgia pain multiple areas for which patient has been on long term chronic pain management.  Currently taking Norco 7.5 mg every 8 hours PRN.  -Continue Norco 7.5 mg q.8 hours-refills given  Mood disorder (CMS HCC)  Chronic anxiety depression for which patient uses Valium  5 mg b.i.d. PRN.  Discussion made concerning increase rest due to patient's age in can current use of narcotics.  Patient agreeable to trial of decrease dose in Valium  to include 2.5 mg b.i.d. PRN.  -decreased Valium  to 2.5 mg b.i.d. PRN-refills given     Orders Placed This Encounter    diazePAM  (VALIUM ) 5 mg Oral Tablet    Blood-Glucose Sensor (DEXCOM G7 SENSOR) Does not apply Device        Total provider time spent with the patient on the phone: 22 minutes.    Post-Discharge Follow Up Appointments       Monday Jan 02, 2025    Return Patient Visit with Scharlene No,  PA-C at 10:00 AM      Tuesday Jan 31, 2025    Return Patient Visit with Scharlene No, PA-C at  9:15 AM      Wednesday Mar 01, 2025    Return Patient Visit with Scharlene No, PA-C at 10:30 AM      Thursday May 25, 2025    Return Telephone Visit with Scharlene No, PA-C at  1:30 PM      [facility], [facility]  [facility]  [address]  [place] [address]  [phone]           This note was partially created using MModal Fluency Direct system (voice recognition software) and is inherently subject to errors including those of syntax and sound-alike substitutions which may escape proofreading.  In such instances, original meaning may be extrapolated by contextual derivation.    Ameilia Rattan, PA-C         [1]   Allergies  Allergen Reactions    Codeine Nausea/ Vomiting    Amoxicillin Diarrhea    Macrobid   [Nitrofurantoin  Monohyd/M-Cryst] Nausea/ Vomiting

## 2024-12-07 NOTE — Assessment & Plan Note (Signed)
 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  - Continue outpatient physical therapy at home.  -continue follow-up with Orthopedic as scheduled

## 2024-12-07 NOTE — Assessment & Plan Note (Signed)
 Chronic anxiety depression for which patient uses Valium  5 mg b.i.d. PRN.  Discussion made concerning increase rest due to patient's age in can current use of narcotics.  Patient agreeable to trial of decrease dose in Valium  to include 2.5 mg b.i.d. PRN.  -decreased Valium  to 2.5 mg b.i.d. PRN-refills given

## 2024-12-12 MED ORDER — HYDROCODONE 7.5 MG-ACETAMINOPHEN 325 MG TABLET: 1.0000 | ORAL_TABLET | Freq: Three times a day (TID) | ORAL | 0 refills | Status: DC | PRN

## 2024-12-19 NOTE — Assessment & Plan Note (Addendum)
 Chronic back pain/fibromyalgia as well as left shoulder pain with noted labral tear.  Patient is awaiting surgery with orthopedic at this time concerning her left shoulder.  Referral has been made to the neurosurgeon in [place]  concerning patient's back pain.  Chronic pain management noted.  -continue Norco 7.5 mg q.8 hours PRN

## 2024-12-21 NOTE — Assessment & Plan Note (Signed)
 Chronic joint pain with noted osteoarthritis.  Once again patient on chronic pain management.

## 2024-12-21 NOTE — Assessment & Plan Note (Signed)
 Chronic lower back pain requiring pain management.  Patient has been  On chronic pain medications for multiple years without side effectss or sedation.  -  Continue Norco 7.5 mg q.8 hours-refills given

## 2024-12-21 NOTE — Assessment & Plan Note (Signed)
 History of uncontrolled diabetes with last A1c 7.1.   -Continue current meds along w diet BS monitoring

## 2024-12-21 NOTE — Assessment & Plan Note (Signed)
 Chronic anxiety as well as depression with patient currently on Valium  5 mg b.i.d. for which she is attempting a weaning trial of 1/2 pill b.i.d. due to increase risk with medication use in combination with opioid.

## 2024-12-21 NOTE — Assessment & Plan Note (Signed)
 Chronic left shoulder pain for which patient is scheduled to undergo surgery tomorrow with orthopedic surgeon in [place].

## 2025-01-02 ENCOUNTER — Other Ambulatory Visit: Payer: Self-pay

## 2025-01-02 ENCOUNTER — Encounter (INDEPENDENT_AMBULATORY_CARE_PROVIDER_SITE_OTHER): Payer: Self-pay | Admitting: Physician Assistant

## 2025-01-02 ENCOUNTER — Ambulatory Visit: Attending: Physician Assistant | Admitting: Physician Assistant

## 2025-01-02 VITALS — BP 122/82 | HR 53 | Ht 65.0 in | Wt 170.0 lb

## 2025-01-02 DIAGNOSIS — Z7984 Long term (current) use of oral hypoglycemic drugs: Secondary | ICD-10-CM

## 2025-01-02 DIAGNOSIS — E785 Hyperlipidemia, unspecified: Secondary | ICD-10-CM | POA: Insufficient documentation

## 2025-01-02 DIAGNOSIS — G8929 Other chronic pain: Secondary | ICD-10-CM | POA: Insufficient documentation

## 2025-01-02 DIAGNOSIS — F39 Unspecified mood [affective] disorder: Secondary | ICD-10-CM | POA: Insufficient documentation

## 2025-01-02 DIAGNOSIS — G479 Sleep disorder, unspecified: Secondary | ICD-10-CM | POA: Insufficient documentation

## 2025-01-02 DIAGNOSIS — M545 Low back pain, unspecified: Secondary | ICD-10-CM | POA: Insufficient documentation

## 2025-01-02 DIAGNOSIS — Z794 Long term (current) use of insulin: Secondary | ICD-10-CM

## 2025-01-02 DIAGNOSIS — E559 Vitamin D deficiency, unspecified: Secondary | ICD-10-CM | POA: Insufficient documentation

## 2025-01-02 DIAGNOSIS — E538 Deficiency of other specified B group vitamins: Secondary | ICD-10-CM | POA: Insufficient documentation

## 2025-01-02 DIAGNOSIS — Z79899 Other long term (current) drug therapy: Secondary | ICD-10-CM | POA: Insufficient documentation

## 2025-01-02 DIAGNOSIS — I10 Essential (primary) hypertension: Secondary | ICD-10-CM | POA: Insufficient documentation

## 2025-01-02 DIAGNOSIS — F419 Anxiety disorder, unspecified: Secondary | ICD-10-CM

## 2025-01-02 DIAGNOSIS — F32A Depression, unspecified: Secondary | ICD-10-CM

## 2025-01-02 DIAGNOSIS — E1165 Type 2 diabetes mellitus with hyperglycemia: Secondary | ICD-10-CM | POA: Insufficient documentation

## 2025-01-02 DIAGNOSIS — Z7985 Long-term (current) use of injectable non-insulin antidiabetic drugs: Secondary | ICD-10-CM

## 2025-01-02 LAB — CBC WITH DIFF
BASOPHIL #: 0.1 x10ˆ3/uL (ref 0.00–0.10)
BASOPHIL %: 1 % (ref 0–1)
EOSINOPHIL #: 0.1 x10ˆ3/uL (ref 0.00–0.50)
EOSINOPHIL %: 2 % (ref 1–7)
HCT: 44.1 % — ABNORMAL HIGH (ref 31.2–41.9)
HGB: 14.9 g/dL — ABNORMAL HIGH (ref 10.9–14.3)
LYMPHOCYTE #: 2.1 x10ˆ3/uL (ref 1.10–3.10)
LYMPHOCYTE %: 41 % (ref 16–46)
MCH: 33 pg — ABNORMAL HIGH (ref 24.7–32.8)
MCHC: 33.8 g/dL (ref 32.3–35.6)
MCV: 97.7 fL — ABNORMAL HIGH (ref 75.5–95.3)
MONOCYTE #: 0.4 x10ˆ3/uL (ref 0.20–0.90)
MONOCYTE %: 8 % (ref 4–11)
MPV: 11.2 fL — ABNORMAL HIGH (ref 7.9–10.8)
NEUTROPHIL #: 2.5 x10ˆ3/uL (ref 1.90–8.20)
NEUTROPHIL %: 48 % (ref 43–77)
PLATELETS: 173 x10ˆ3/uL (ref 140–440)
RBC: 4.52 x10ˆ6/uL (ref 3.63–4.92)
RDW: 12.9 % (ref 12.3–17.7)
WBC: 5.2 x10ˆ3/uL (ref 3.8–11.8)

## 2025-01-02 LAB — MAGNESIUM: MAGNESIUM: 1.8 mg/dL — ABNORMAL LOW (ref 1.9–2.7)

## 2025-01-02 LAB — IRON TRANSFERRIN AND TIBC
IRON (TRANSFERRIN) SATURATION: 17 % (ref 15–50)
IRON: 59 ug/dL (ref 50–212)
TOTAL IRON BINDING CAPACITY: 354 ug/dL (ref 250–450)
TRANSFERRIN: 253 mg/dL (ref 203–362)
UIBC: 295 ug/dL (ref 130–375)

## 2025-01-02 LAB — URINALYSIS, MICROSCOPIC

## 2025-01-02 LAB — DRUG SCREEN, NO CONFIRMATION, URINE
AMPHETAMINES URINE: NEGATIVE
BARBITURATES URINE: NEGATIVE
BENZODIAZEPINES URINE: POSITIVE — AB
BUPRENORPHINE URINE: NEGATIVE
CANNABINOIDS URINE: NEGATIVE
COCAINE METABOLITES URINE: NEGATIVE
FENTANYL, URINE: NEGATIVE
METHADONE URINE: NEGATIVE
OPIATES URINE: POSITIVE — AB
OXYCODONE URINE: NEGATIVE
PCP URINE: NEGATIVE

## 2025-01-02 LAB — URINALYSIS, MACROSCOPIC
BILIRUBIN: NEGATIVE mg/dL
BLOOD: NEGATIVE mg/dL
GLUCOSE: >=1000 mg/dL — AB
KETONES: NEGATIVE mg/dL
NITRITE: NEGATIVE
PH: 5.5 (ref 5.0–8.0)
PROTEIN: NEGATIVE mg/dL
SPECIFIC GRAVITY: 1.033 — ABNORMAL HIGH (ref 1.005–1.030)
UROBILINOGEN: NEGATIVE mg/dL

## 2025-01-02 LAB — COMPREHENSIVE METABOLIC PNL, FASTING
ALBUMIN/GLOBULIN RATIO: 1.2 (ref 0.8–1.4)
ALBUMIN: 4.1 g/dL (ref 3.5–5.7)
ALKALINE PHOSPHATASE: 72 U/L (ref 34–104)
ALT (SGPT): 13 U/L (ref 7–52)
ANION GAP: 6 mmol/L (ref 4–13)
AST (SGOT): 22 U/L (ref 13–39)
BILIRUBIN TOTAL: 0.5 mg/dL (ref 0.3–1.0)
BUN/CREA RATIO: 14 (ref 6–22)
BUN: 13 mg/dL (ref 7–25)
CALCIUM, CORRECTED: 9.4 mg/dL (ref 8.9–10.8)
CALCIUM: 9.5 mg/dL (ref 8.6–10.3)
CHLORIDE: 105 mmol/L (ref 98–107)
CO2 TOTAL: 30 mmol/L (ref 21–31)
CREATININE: 0.92 mg/dL (ref 0.60–1.30)
ESTIMATED GFR: 65 mL/min/1.73mˆ2 (ref >59)
GLOBULIN: 3.3 (ref 2.0–3.5)
GLUCOSE: 107 mg/dL (ref 74–109)
OSMOLALITY, CALCULATED: 282 mosm/kg (ref 270–290)
POTASSIUM: 4.5 mmol/L (ref 3.5–5.1)
PROTEIN TOTAL: 7.4 g/dL (ref 6.4–8.9)
SODIUM: 141 mmol/L (ref 136–145)

## 2025-01-02 LAB — LIPID PANEL
CHOL/HDL RATIO: 3.6
CHOLESTEROL: 167 mg/dL (ref <200)
HDL CHOL: 46 mg/dL (ref >=40)
LDL CALC: 87 mg/dL (ref 0–100)
TRIGLYCERIDES: 171 mg/dL — ABNORMAL HIGH (ref <=150)
VLDL CALC: 34 mg/dL (ref 0–50)

## 2025-01-02 LAB — MICROALBUMIN/CREATININE RATIO, URINE, RANDOM
CREATININE RANDOM URINE: 150 mg/dL — ABNORMAL HIGH (ref 30–125)
MICROALBUMIN RANDOM URINE: 1.8 mg/dL
MICROALBUMIN/CREATININE RATIO RANDOM URINE: 12 mg/g

## 2025-01-02 LAB — VITAMIN D 25 TOTAL: VITAMIN D 25, TOTAL: 78.55 ng/mL (ref 30.00–100.00)

## 2025-01-02 LAB — HGA1C (HEMOGLOBIN A1C WITH EST AVG GLUCOSE): HEMOGLOBIN A1C: 7.7 % — ABNORMAL HIGH (ref 4.0–6.0)

## 2025-01-02 LAB — THYROID STIMULATING HORMONE WITH FREE T4 REFLEX: TSH: 2.108 u[IU]/mL (ref 0.450–5.330)

## 2025-01-02 LAB — FOLATE: FOLATE: 25.2 ng/mL — ABNORMAL HIGH (ref 5.9–24.8)

## 2025-01-02 MED ORDER — SOLIFENACIN 5 MG TABLET: 5.0000 mg | ORAL_TABLET | Freq: Every day | ORAL | 1 refills | Status: AC

## 2025-01-02 MED ORDER — CYANOCOBALAMIN (VIT B-12) 1,000 MCG/ML INJECTION SOLUTION
1000.0000 ug | INTRAMUSCULAR | Status: AC
  Administered 2025-01-02: 1000 ug via SUBCUTANEOUS

## 2025-01-02 MED ORDER — METFORMIN 1,000 MG TABLET: 500.0000 mg | ORAL_TABLET | Freq: Every morning | ORAL | 1 refills | Status: AC

## 2025-01-02 MED ORDER — MAGNESIUM OXIDE 400 MG (241.3 MG MAGNESIUM) TABLET: 400.0000 mg | ORAL_TABLET | Freq: Two times a day (BID) | ORAL | 3 refills | Status: AC

## 2025-01-02 MED ORDER — INSULIN LISPRO (U-100) 100 UNIT/ML SUBCUTANEOUS PEN: PEN_INJECTOR | SUBCUTANEOUS | 2 refills | Status: AC

## 2025-01-02 MED ORDER — DIAZEPAM 5 MG TABLET: ORAL_TABLET | ORAL | 0 refills | Status: AC

## 2025-01-02 MED ORDER — ERGOCALCIFEROL (VITAMIN D2) 1,250 MCG (50,000 UNIT) CAPSULE: 50000.0000 [IU] | ORAL_CAPSULE | ORAL | 1 refills | Status: AC

## 2025-01-02 MED ORDER — INSULIN GLARGINE (U-300) CONC. 300 UNIT/ML (3 ML) SUBCUTANEOUS PEN: 35.0000 [IU] | PEN_INJECTOR | Freq: Every evening | SUBCUTANEOUS | 2 refills | Status: AC

## 2025-01-02 MED ORDER — ROSUVASTATIN 20 MG TABLET: 20.0000 mg | ORAL_TABLET | Freq: Every evening | ORAL | 4 refills | Status: AC

## 2025-01-02 MED ORDER — LEVETIRACETAM ER 500 MG TABLET,EXTENDED RELEASE 24 HR: 1.0000 | ORAL_TABLET | Freq: Every evening | ORAL | 1 refills | Status: AC

## 2025-01-02 MED ORDER — DEXCOM G7 RECEIVER: 1.0000 | Freq: Two times a day (BID) | 0 refills | Status: AC

## 2025-01-02 MED ORDER — SUMATRIPTAN 50 MG TABLET: 50.0000 mg | ORAL_TABLET | Freq: Once | ORAL | 2 refills | Status: AC | PRN

## 2025-01-02 MED ORDER — VENLAFAXINE ER 37.5 MG CAPSULE,EXTENDED RELEASE 24 HR: 37.5000 mg | ORAL_CAPSULE | Freq: Every evening | ORAL | 1 refills | Status: AC

## 2025-01-02 MED ORDER — OZEMPIC 1 MG/DOSE (4 MG/3 ML) SUBCUTANEOUS PEN INJECTOR: 1.0000 mg | PEN_INJECTOR | SUBCUTANEOUS | 1 refills | Status: AC

## 2025-01-02 NOTE — Assessment & Plan Note (Signed)
 BP stable on current meds.  -Continue meds along with blood pressure monitoring

## 2025-01-02 NOTE — Assessment & Plan Note (Signed)
 Chronic anxiety as well as depression with patient currently on Valium  2.5 mg b.i.d. for which she attempted a weaning trial of 1/2 pill b.i.d. due to increase risk with medication use in combination with opioid.  Increase sleeping issues noted so patient requesting to take the nighttime dose back to the 5 mg.  Continues Effexor  37.5 mg daily for depression.  -increase nighttime dose of Valium  5 mg nightly continue 2.5 mg during the day  -continue Effexor  37.5mg  qd

## 2025-01-02 NOTE — Assessment & Plan Note (Signed)
 Vitamin B12 deficiency  Reports fatigue, requires B12 supplementation.  - Administered B12 injection.

## 2025-01-02 NOTE — Assessment & Plan Note (Signed)
 Mag low w pt current on mag supplement.  -obtain mag level in blood work  -Continue Mag-Ox 400 mg b.i.d.

## 2025-01-02 NOTE — Assessment & Plan Note (Signed)
 Chronic lower back pain requiring pain management.  Patient has been  On chronic pain medications for multiple years without side effectss or sedation.  -  Continue Norco 7.5 mg q.8 hours-refills given

## 2025-01-02 NOTE — Assessment & Plan Note (Signed)
 History of uncontrolled diabetes with last A1c 7.1.  Currently on Jardiance  Toujeo  metformin  and Ozempic .  -obtain glucose A1c level in blood work   -Continue current meds along w diet BS monitoring

## 2025-01-02 NOTE — Progress Notes (Signed)
 [facility]  [address]  [place] [address]          Follow Up        Reason for Visit: Follow Up (Routine 1 month ) B12 shot    This note was created with assistance from [employer] via capture of conversational audio.  Consent was obtained from the patient prior to recording.      History of Present Illness  Grace Hoffman is a 74 year old female with diabetes who presents with sleep disturbances and medication management.    She experiences difficulty sleeping, often waking up at 2:30 AM and again at 4:00 AM. She has reduced her diazepam  dosage due to recommendations to decrease because she is at high risk, which may be contributing to her sleep issues.    She manages her diabetes with several medications, including metformin  500 mg once daily, Jardiance  25 mg daily, Toujeo  35 units nightly, and Ozempic  1 mg weekly. She resumed Jardiance  14 days after her recent shoulder surgery so has been back on medicine for around a month or so.  Her last A1c was 7.1 four months ago which was significantly better than previous readings.    Once again she underwent left shoulder surgery on November 08, 2024, which affected her medication schedule. She currently performs exercises independently, similar to those used after a previous rotator cuff surgery.  Pain overall is improved.    No recent diabetic eye exam, having canceled a field test appointment in May.  States she will call make an appointment with Dr Jolyne soon.     Blood pressure is good today at 122/82.  She continues taking Vasotec  10 mg daily along with hydrochlorothiazide  25 mg daily. Hx of CKD stage 2 as well noted w  renal function overall stable. Continues following w nephrologist.     Follow-up blood pressure/hypertension w BP currently stable at 112/62.  She continues taking Vasotec  10 mg daily as well as hydrochlorothiazide  25 mg daily p.r.n..       Follow-up B12 deficiency as well as magnesium  deficiency with chronic  fatigue issues noted.  Patient is requesting B12 injection today which do seem to help with her energy level.       Follow-up chronic pain/DJD/fibromyalgia for which she has been on chronic pain therapy/narcotics for numerous years.  Patient had reported increased pain in her lower back with x-rays showing moderate DJD the MRI of spine ordered.  She did have an MRI and  was referred to [facility] neurosurgeon.  She also is currently taking Norco 7.5 mg q.6 hours p.r.n. which does help with her chronic pain issues.  She denies any side effects to medication and compliant w urine drug screen.  Requesting medication refill.     Follow-up anxiety/depression with patient previously on Valium  5 mg b.i.d. p.r.n. however recently decreased to 2.5 mg b.i.d. and efforts to decrease patient's risk of sedation due to patient using benzo with opioid.  Patient did decrease the medication as ordered but states she has been unable to sleep well at night and wakes up several times which has increased her fatigue issues.  She notes that prior she had been on Valium  5 mg twice a day in combination with the Norco and had no side effects of both medicines being used it once.  She is requesting to increase at least her nighttime Valium  dose today.  She also takes  Effexor  37.5 mg daily.     PHQ Questionnaire  Past Medical History:   Diagnosis Date    Anxiety     Chronic back pain     Chronic pain 04/03/2022    COVID-19 vaccine series completed     Depression     Diabetes mellitus, type 2     Fibromyalgia affecting multiple sites     Hypertension     Hypomagnesemia     Insomnia disorder     Migraine     Mixed hyperlipidemia     Orthostatic hypotension     Osteoarthritis     TIA (transient ischemic attack)     Unspecified glaucoma(365.9)     Vitamin D  deficiency          Past Surgical History:   Procedure Laterality Date    HX CHOLECYSTECTOMY      HX KNEE REPLACMENT Left     HX ROTATOR CUFF REPAIR Left     SKIN CANCER EXCISION       forehead removed      Family Medical History:       Problem Relation (Age of Onset)    Kidney failure Mother    No Known Problems Sister, Brother, Half-Sister, Half-Brother, Maternal Aunt, Maternal Uncle, Paternal Aunt, Paternal Uncle, Maternal Grandmother, Maternal Grandfather, Paternal Grandmother, Paternal Grandfather, Daughter, Son    Respiratory Problems Father            Social History     Tobacco Use    Smoking status: Never    Smokeless tobacco: Never   Vaping Use    Vaping status: Never Used   Substance Use Topics    Alcohol use: Never    Drug use: Never     Medication:  Blood-Glucose Sensor (DEXCOM G7 SENSOR) Does not apply Device, 1 Device Every 10 days for 90 days  clindamycin  (CLEOCIN ) 150 mg Oral Capsule, Take 1 Capsule (150 mg total) by mouth Four times a day  d-mannose 500 mg Oral Capsule, Take 1 Capsule (500 mg total) by mouth Twice daily  empagliflozin  (JARDIANCE ) 25 mg Oral Tablet, Take 1 Tablet (25 mg total) by mouth Once a day  HYDROcodone -acetaminophen  (NORCO) 7.5-325 mg Oral Tablet, Take 1 Tablet by mouth Every 8 hours as needed for up to 30 days Indications: pain  latanoprost  (XALATAN ) 0.005 % Ophthalmic Drops, Administer 1 Drop into the left eye Every night  ondansetron  (ZOFRAN  ODT) 4 mg Oral Tablet, Rapid Dissolve, Take 1 Tablet (4 mg total) by mouth Every 8 hours as needed for Nausea/Vomiting  Blood-Glucose Meter,Continuous (FREESTYLE LIBRE 3 READER) Does not apply Misc, 1 Each Every 6 hours as needed  diazePAM  (VALIUM ) 5 mg Oral Tablet, Take 0.5 Tablets (2.5 mg total) by mouth Every 12 hours as needed for Anxiety  ergocalciferol , vitamin D2, (DRISDOL ) 1,250 mcg (50,000 unit) Oral Capsule, Take 1 Capsule (50,000 Units total) by mouth Every 7 days  insulin  glargine U-300 conc (TOUJEO  MAX U-300 SOLOSTAR) 300 unit/mL (3 mL) Subcutaneous Insulin  Pen, Inject 35 Units under the skin Every night  insulin  lispro (HUMALOG  KWIKPEN INSULIN ) 100 unit/mL Subcutaneous Insulin  Pen, Use as directed  sliding scale; also use 5 units with meals Indications: type 2 diabetes mellitus  Levetiracetam  500 mg Oral Tablet Sustained Release 24 hr, Take 1 Tablet (500 mg total) by mouth Every night  magnesium  oxide (MAG-OX) 400 mg Oral Tablet, Take 1 Tablet (400 mg total) by mouth Twice daily  MetFORMIN  (GLUCOPHAGE ) 1,000 mg Oral Tablet, Take 0.5 Tablets (500 mg total) by mouth Twice daily with food (Patient taking differently:  Take 0.5 Tablets (500 mg total) by mouth Every morning with breakfast)  rosuvastatin  (CRESTOR ) 20 mg Oral Tablet, Take 1 Tablet (20 mg total) by mouth Every evening  semaglutide  (OZEMPIC ) 1 mg/dose (4 mg/3 mL) Subcutaneous Pen Injector, Inject 1 mg under the skin Every 7 days  solifenacin  (VESICARE ) 5 mg Oral Tablet, Take 1 Tablet (5 mg total) by mouth Once a day  SUMAtriptan  (IMITREX ) 50 mg Oral Tablet, Take 1 Tablet (50 mg total) by mouth Once, as needed for Migraine TAKE 1 TABLET BY MOUTH AT ONSET OF HEADACHE. IF NO RELIEF MAY REPEAT 1 AFTER AT LEAST 2 HOURS. MAX OF 4 TABS PER 24 HOURS  venlafaxine  (EFFEXOR  XR) 37.5 mg Oral Capsule, Sust. Release 24 hr, Take 1 Capsule (37.5 mg total) by mouth Every night    No facility-administered medications prior to visit.    Allergies:  Allergies   Allergen Reactions    Codeine Nausea/ Vomiting    Amoxicillin Diarrhea    Macrobid  [Nitrofurantoin  Monohyd/M-Cryst] Nausea/ Vomiting       Physical Exam:  Vitals:    01/02/25 1024   BP: 122/82   Pulse: 53   SpO2: 97%   Weight: 77.1 kg (170 lb)   Height: 1.651 m (5' 5)   BMI: 28.29      Physical Exam  Vitals and nursing note reviewed.   Constitutional:       Appearance: Normal appearance. She is obese.   HENT:      Right Ear: Tympanic membrane normal.      Left Ear: Tympanic membrane normal.      Mouth/Throat:      Mouth: Mucous membranes are moist.      Pharynx: Oropharynx is clear.   Eyes:      Extraocular Movements: Extraocular movements intact.      Pupils: Pupils are equal, round, and reactive to light.    Cardiovascular:      Rate and Rhythm: Normal rate and regular rhythm.      Pulses: Normal pulses.   Pulmonary:      Effort: Pulmonary effort is normal.      Breath sounds: Normal breath sounds.   Abdominal:      General: Bowel sounds are normal.      Palpations: Abdomen is soft.      Tenderness: There is no abdominal tenderness. There is no right CVA tenderness, left CVA tenderness or rebound.   Musculoskeletal:         General: No swelling or tenderness.     Cervical back: Normal range of motion and neck supple.      Comments: Arthritic changes hands and knees noted bilateral  Chronic trigger points upper back and thigh area  Chronic low back pain with range of motion  Negative straight leg raises  Gait stable   + limited range of motion left shoulder still noted post surgery; + surgical scar of left anterior shoulder noted  Skin:     General: Skin is warm.      Findings: No lesion or rash.   Neurological:      General: No focal deficit present.      Mental Status: She is alert and oriented to person, place, and time.      Sensory: No sensory deficit.      Motor: Weakness (Strength of upper and lower extremities decreased however equal bilateral) present.      Gait: Gait normal.   Psychiatric:         Mood  and Affect: Mood normal.         Behavior: Behavior normal.         Thought Content: Thought content normal.         Judgment: Judgment normal.     Assessment & Plan  Trouble in sleeping  Reports difficulty sleeping, possibly related to reduced Valium  dosage.  Requesting to increase nighttime dose.  - Increased Valium  to 5 mg at night.  Continue 2.5 mg in daytime PRN  Uncontrolled type 2 diabetes mellitus with hyperglycemia (CMS HCC)  History of uncontrolled diabetes with last A1c 7.1.  Currently on Jardiance  Toujeo  metformin  and Ozempic .  -obtain glucose A1c level in blood work   -Continue current meds along w diet BS monitoring  Essential hypertension  BP stable on current meds.  -Continue meds along with  blood pressure monitoring  B12 deficiency  Vitamin B12 deficiency  Reports fatigue, requires B12 supplementation.  - Administered B12 injection.  Hypomagnesemia  Mag low w pt current on mag supplement.  -obtain mag level in blood work  -Continue Mag-Ox 400 mg b.i.d.  Chronic low back pain, unspecified back pain laterality, unspecified whether sciatica present  Chronic lower back pain requiring pain management.  Patient has been  On chronic pain medications for multiple years without side effectss or sedation.  -  Continue Norco 7.5 mg q.8 hours-refills given  Mood disorder (CMS HCC)  Chronic anxiety as well as depression with patient currently on Valium  2.5 mg b.i.d. for which she attempted a weaning trial of 1/2 pill b.i.d. due to increase risk with medication use in combination with opioid.  Increase sleeping issues noted so patient requesting to take the nighttime dose back to the 5 mg.  Continues Effexor  37.5 mg daily for depression.  -increase nighttime dose of Valium  5 mg nightly continue 2.5 mg during the day  -continue Effexor  37.5mg  qd  Hyperlipidemia, unspecified hyperlipidemia type  Elevated chol with pt on Crestor  20 mg q hs.  -obtain lipid panel in blood work  -continue crestor   Vitamin D  deficiency  Vit d def w pt on vit d weekly.  -obtain vit d level in blood work  -continue vit d supplement  Long-term use of high-risk medication  Pt on current benzo along with narcotics.  -obtain UDS    Orders Placed This Encounter    CBC/DIFF    COMPREHENSIVE METABOLIC PNL, FASTING    HGA1C (HEMOGLOBIN A1C WITH EST AVG GLUCOSE)    URINALYSIS, MACROSCOPIC AND MICROSCOPIC W/CULTURE REFLEX    LIPID PANEL    MICROALBUMIN/CREATININE RATIO, URINE, RANDOM    THYROID  STIMULATING HORMONE WITH FREE T4 REFLEX    DRUG SCREEN, NO CONFIRMATION, URINE    MAGNESIUM     VITAMIN D  25 TOTAL    VITAMIN B12    FOLATE    IRON TRANSFERRIN AND TIBC    CBC WITH DIFF    URINALYSIS, MACROSCOPIC    URINALYSIS, MICROSCOPIC    Blood-Glucose  Meter,Continuous (DEXCOM G7 RECEIVER) Does not apply Misc    ergocalciferol , vitamin D2, (DRISDOL ) 1,250 mcg (50,000 unit) Oral Capsule    insulin  glargine U-300 conc (TOUJEO  MAX U-300 SOLOSTAR) 300 unit/mL (3 mL) Subcutaneous Insulin  Pen    insulin  lispro (HUMALOG  KWIKPEN INSULIN ) 100 unit/mL Subcutaneous Insulin  Pen    Levetiracetam  500 mg Oral Tablet Sustained Release 24 hr    magnesium  oxide (MAG-OX) 400 mg Oral Tablet    MetFORMIN  (GLUCOPHAGE ) 1,000 mg Oral Tablet    rosuvastatin  (CRESTOR ) 20 mg Oral Tablet  semaglutide  (OZEMPIC ) 1 mg/dose (4 mg/3 mL) Subcutaneous Pen Injector    solifenacin  (VESICARE ) 5 mg Oral Tablet    SUMAtriptan  (IMITREX ) 50 mg Oral Tablet    venlafaxine  (EFFEXOR  XR) 37.5 mg Oral Capsule, Sust. Release 24 hr    cyanocobalamin  (VITAMIN B12) 1000 mcg/mL injection    diazePAM  (VALIUM ) 5 mg Oral Tablet       Post-Discharge Follow Up Appointments       Tuesday Jan 31, 2025    Return Patient Visit with Scharlene No, PA-C at  9:15 AM      Wednesday Mar 01, 2025    Return Patient Visit with Scharlene No, PA-C at 10:30 AM      Thursday May 25, 2025    Return Telephone Visit with Scharlene No, PA-C at  1:30 PM      [facility]  [facility]  [address]  [place] [address]  [phone]           Seek medical attention for new or worsening symptoms.  Patient has been seen in this clinic within the last 3 years.     Ailany Koren, PA-C          This note was partially created using MModal Fluency Direct system (voice recognition software) and is inherently subject to errors including those of syntax and sound-alike substitutions which may escape proofreading.  In such instances, original meaning may be extrapolated by contextual derivation.

## 2025-01-02 NOTE — Assessment & Plan Note (Signed)
 Vit d def w pt on vit d weekly.  -obtain vit d level in blood work  -continue vit d supplement

## 2025-01-02 NOTE — Nursing Note (Signed)
 Patient presents today for routine 1 month follow up for medication refill.

## 2025-01-02 NOTE — Assessment & Plan Note (Signed)
 Elevated chol with pt on Crestor  20 mg q hs.  -obtain lipid panel in blood work  -continue crestor 

## 2025-01-02 NOTE — Assessment & Plan Note (Signed)
 Pt on current benzo along with narcotics.  -obtain UDS

## 2025-01-03 ENCOUNTER — Ambulatory Visit (INDEPENDENT_AMBULATORY_CARE_PROVIDER_SITE_OTHER): Payer: Self-pay

## 2025-01-03 LAB — VITAMIN B12: VITAMIN B 12: 376 pg/mL (ref 180–914)

## 2025-01-04 LAB — URINE CULTURE,ROUTINE: URINE CULTURE: 10000 — AB

## 2025-01-09 ENCOUNTER — Ambulatory Visit (INDEPENDENT_AMBULATORY_CARE_PROVIDER_SITE_OTHER): Payer: Self-pay | Admitting: Physician Assistant

## 2025-01-12 ENCOUNTER — Telehealth (INDEPENDENT_AMBULATORY_CARE_PROVIDER_SITE_OTHER): Payer: Self-pay | Admitting: Physician Assistant

## 2025-01-12 NOTE — Telephone Encounter (Signed)
 Grace Hoffman at [facility] contacted office on patient's insulin  Lispro. She was wanting to know what the max dose daily of her insulin  should be? She is on a sliding scale and 5 units with meals.

## 2025-01-13 NOTE — Telephone Encounter (Signed)
 Contacted Garrel the pharmacist at [facility] and informed him that per Amy the maximum daily is 40 units.

## 2025-01-24 ENCOUNTER — Ambulatory Visit: Admitting: Physician Assistant

## 2025-01-24 ENCOUNTER — Ambulatory Visit (HOSPITAL_BASED_OUTPATIENT_CLINIC_OR_DEPARTMENT_OTHER)
Admission: RE | Admit: 2025-01-24 | Discharge: 2025-01-24 | Disposition: A | Source: Ambulatory Visit | Attending: Physician Assistant

## 2025-01-24 ENCOUNTER — Other Ambulatory Visit (INDEPENDENT_AMBULATORY_CARE_PROVIDER_SITE_OTHER): Payer: Self-pay | Admitting: Physician Assistant

## 2025-01-24 ENCOUNTER — Other Ambulatory Visit: Payer: Self-pay

## 2025-01-24 ENCOUNTER — Encounter (INDEPENDENT_AMBULATORY_CARE_PROVIDER_SITE_OTHER): Payer: Self-pay | Admitting: Physician Assistant

## 2025-01-24 VITALS — BP 138/68 | HR 69 | Ht 65.0 in

## 2025-01-24 DIAGNOSIS — M79669 Pain in unspecified lower leg: Secondary | ICD-10-CM

## 2025-01-24 DIAGNOSIS — R7309 Other abnormal glucose: Secondary | ICD-10-CM

## 2025-01-24 DIAGNOSIS — M25562 Pain in left knee: Secondary | ICD-10-CM

## 2025-01-24 DIAGNOSIS — E119 Type 2 diabetes mellitus without complications: Secondary | ICD-10-CM

## 2025-01-24 DIAGNOSIS — Z7985 Long-term (current) use of injectable non-insulin antidiabetic drugs: Secondary | ICD-10-CM

## 2025-01-24 MED ORDER — KETOROLAC 30 MG/ML (1 ML) INJECTION SOLUTION
30.0000 mg | Freq: Once | INTRAMUSCULAR | Status: AC
  Administered 2025-01-24: 30 mg via INTRAMUSCULAR

## 2025-01-24 NOTE — Nursing Note (Signed)
 Patient presents today with complaints that her blood sugar has been staying elevated and that her left leg is hurting to stand on it. Patient states that the pain is behind her knee.

## 2025-01-25 ENCOUNTER — Ambulatory Visit (INDEPENDENT_AMBULATORY_CARE_PROVIDER_SITE_OTHER): Payer: Self-pay

## 2025-01-25 ENCOUNTER — Other Ambulatory Visit (INDEPENDENT_AMBULATORY_CARE_PROVIDER_SITE_OTHER): Payer: Self-pay | Admitting: Physician Assistant

## 2025-01-25 NOTE — Telephone Encounter (Signed)
 Patient had appointment yesterday and needed medication refills.

## 2025-01-26 MED ORDER — HYDROCODONE 7.5 MG-ACETAMINOPHEN 325 MG TABLET: 1.0000 | ORAL_TABLET | Freq: Three times a day (TID) | ORAL | 0 refills | Status: AC | PRN

## 2025-01-31 ENCOUNTER — Ambulatory Visit (INDEPENDENT_AMBULATORY_CARE_PROVIDER_SITE_OTHER): Payer: Self-pay | Admitting: Physician Assistant

## 2025-03-01 ENCOUNTER — Ambulatory Visit (INDEPENDENT_AMBULATORY_CARE_PROVIDER_SITE_OTHER): Payer: Self-pay | Admitting: Physician Assistant

## 2025-05-25 ENCOUNTER — Ambulatory Visit: Admitting: Physician Assistant
